# Patient Record
Sex: Male | Born: 1945 | Race: White | Hispanic: No | Marital: Married | Smoking: Former smoker
Health system: Southern US, Community
[De-identification: ages and names within clinical notes are randomized; demographics above are authoritative.]

## PROBLEM LIST (undated history)

## (undated) DIAGNOSIS — J189 Pneumonia, unspecified organism: Secondary | ICD-10-CM

## (undated) DIAGNOSIS — Z8673 Personal history of transient ischemic attack (TIA), and cerebral infarction without residual deficits: Secondary | ICD-10-CM

## (undated) DIAGNOSIS — I10 Essential (primary) hypertension: Secondary | ICD-10-CM

## (undated) DIAGNOSIS — E785 Hyperlipidemia, unspecified: Secondary | ICD-10-CM

## (undated) HISTORY — DX: Pneumonia, unspecified organism: J18.9

## (undated) HISTORY — DX: Essential (primary) hypertension: I10

## (undated) HISTORY — PX: TONSILLECTOMY: SUR1361

## (undated) HISTORY — PX: NASAL POLYP EXCISION: SHX2068

## (undated) HISTORY — DX: Hyperlipidemia, unspecified: E78.5

## (undated) HISTORY — DX: Personal history of transient ischemic attack (TIA), and cerebral infarction without residual deficits: Z86.73

---

## 2001-05-29 LAB — HM COLONOSCOPY: HM Colonoscopy: NORMAL

## 2008-02-18 ENCOUNTER — Ambulatory Visit: Payer: Self-pay | Admitting: Internal Medicine

## 2008-02-29 DIAGNOSIS — J4541 Moderate persistent asthma with (acute) exacerbation: Secondary | ICD-10-CM

## 2008-02-29 DIAGNOSIS — J328 Other chronic sinusitis: Secondary | ICD-10-CM | POA: Insufficient documentation

## 2008-02-29 DIAGNOSIS — J209 Acute bronchitis, unspecified: Secondary | ICD-10-CM | POA: Insufficient documentation

## 2008-03-04 ENCOUNTER — Ambulatory Visit: Payer: Self-pay | Admitting: Internal Medicine

## 2008-05-22 ENCOUNTER — Ambulatory Visit: Payer: Self-pay | Admitting: Internal Medicine

## 2008-05-22 LAB — CONVERTED CEMR LAB
Eosinophils Relative: 6.3 % — ABNORMAL HIGH (ref 0.0–5.0)
HCT: 47 % (ref 39.0–52.0)
Hemoglobin: 16.4 g/dL (ref 13.0–17.0)
IgE (Immunoglobulin E), Serum: 64.2 intl units/mL (ref 0.0–180.0)
Lymphocytes Relative: 16.7 % (ref 12.0–46.0)
MCHC: 34.9 g/dL (ref 30.0–36.0)
Monocytes Relative: 9.7 % (ref 3.0–12.0)
Neutrophils Relative %: 67.2 % (ref 43.0–77.0)
Platelets: 228 10*3/uL (ref 150–400)
RBC: 4.99 M/uL (ref 4.22–5.81)
RDW: 12.4 % (ref 11.5–14.6)

## 2008-06-30 ENCOUNTER — Ambulatory Visit: Payer: Self-pay | Admitting: Internal Medicine

## 2008-10-29 ENCOUNTER — Ambulatory Visit: Payer: Self-pay | Admitting: Internal Medicine

## 2009-04-17 DIAGNOSIS — Z8673 Personal history of transient ischemic attack (TIA), and cerebral infarction without residual deficits: Secondary | ICD-10-CM

## 2009-04-17 HISTORY — DX: Personal history of transient ischemic attack (TIA), and cerebral infarction without residual deficits: Z86.73

## 2009-10-29 ENCOUNTER — Telehealth: Payer: Self-pay | Admitting: Internal Medicine

## 2009-11-04 ENCOUNTER — Ambulatory Visit: Payer: Self-pay | Admitting: Internal Medicine

## 2009-11-06 DIAGNOSIS — J330 Polyp of nasal cavity: Secondary | ICD-10-CM

## 2010-04-10 ENCOUNTER — Inpatient Hospital Stay (HOSPITAL_COMMUNITY)
Admission: EM | Admit: 2010-04-10 | Discharge: 2010-04-13 | Payer: Self-pay | Attending: Neurology | Admitting: Neurology

## 2010-04-10 ENCOUNTER — Emergency Department (HOSPITAL_COMMUNITY)
Admission: EM | Admit: 2010-04-10 | Discharge: 2010-04-11 | Disposition: A | Discharge status: Other Acute Inpatient Hospital | Payer: Self-pay | Source: Home / Self Care | Admitting: Emergency Medicine

## 2010-04-12 ENCOUNTER — Encounter (INDEPENDENT_AMBULATORY_CARE_PROVIDER_SITE_OTHER): Payer: Self-pay | Admitting: Neurology

## 2010-04-19 ENCOUNTER — Telehealth (INDEPENDENT_AMBULATORY_CARE_PROVIDER_SITE_OTHER): Payer: Self-pay | Admitting: *Deleted

## 2010-04-20 DIAGNOSIS — I6350 Cerebral infarction due to unspecified occlusion or stenosis of unspecified cerebral artery: Secondary | ICD-10-CM | POA: Insufficient documentation

## 2010-04-22 ENCOUNTER — Encounter
Admission: RE | Admit: 2010-04-22 | Discharge: 2010-05-17 | Payer: Self-pay | Source: Home / Self Care | Attending: Nurse Practitioner | Admitting: Nurse Practitioner

## 2010-04-27 ENCOUNTER — Encounter: Payer: Self-pay | Admitting: Internal Medicine

## 2010-04-27 ENCOUNTER — Ambulatory Visit
Admission: RE | Admit: 2010-04-27 | Discharge: 2010-04-27 | Payer: Self-pay | Source: Home / Self Care | Attending: Internal Medicine | Admitting: Internal Medicine

## 2010-04-27 ENCOUNTER — Telehealth: Payer: Self-pay | Admitting: Internal Medicine

## 2010-04-27 ENCOUNTER — Other Ambulatory Visit: Payer: Self-pay | Admitting: Internal Medicine

## 2010-04-27 DIAGNOSIS — Z8679 Personal history of other diseases of the circulatory system: Secondary | ICD-10-CM | POA: Insufficient documentation

## 2010-04-27 DIAGNOSIS — E785 Hyperlipidemia, unspecified: Secondary | ICD-10-CM | POA: Insufficient documentation

## 2010-04-27 DIAGNOSIS — I10 Essential (primary) hypertension: Secondary | ICD-10-CM | POA: Insufficient documentation

## 2010-04-27 LAB — BASIC METABOLIC PANEL
BUN: 13 mg/dL (ref 6–23)
CO2: 29 mEq/L (ref 19–32)
Calcium: 10.3 mg/dL (ref 8.4–10.5)
Chloride: 99 mEq/L (ref 96–112)
Creatinine, Ser: 1 mg/dL (ref 0.4–1.5)
GFR: 83.18 mL/min (ref >60.00)
Glucose, Bld: 95 mg/dL (ref 70–99)
Potassium: 4.4 mEq/L (ref 3.5–5.1)
Sodium: 138 mEq/L (ref 135–145)

## 2010-04-27 LAB — LIPID PANEL
Cholesterol: 290 mg/dL — ABNORMAL HIGH (ref 0–200)
HDL: 84.4 mg/dL (ref >39.00)
Total CHOL/HDL Ratio: 3
Triglycerides: 97 mg/dL (ref 0.0–149.0)
VLDL: 19.4 mg/dL (ref 0.0–40.0)

## 2010-04-27 LAB — LDL CHOLESTEROL, DIRECT: Direct LDL: 192.8 mg/dL

## 2010-04-27 LAB — CONVERTED CEMR LAB

## 2010-04-29 ENCOUNTER — Telehealth: Payer: Self-pay | Admitting: Internal Medicine

## 2010-05-02 ENCOUNTER — Telehealth: Payer: Self-pay | Admitting: Internal Medicine

## 2010-05-11 ENCOUNTER — Ambulatory Visit
Admission: RE | Admit: 2010-05-11 | Discharge: 2010-05-11 | Payer: Self-pay | Source: Home / Self Care | Attending: Internal Medicine | Admitting: Internal Medicine

## 2010-05-17 NOTE — Assessment & Plan Note (Signed)
Summary: rov/jd   Copy to:  Dr. Knox Royalty Primary Provider/Referring Provider:  [facility]  CC:  1 year follow up, pt request new rx for advair, pt states he has been coughing up more mucous than normally and it is yellowish clear x2weeks but pt says he is getting over it now, and pt states his breathing is doing good and overall he is well.  History of Present Illness: 06/30/08- Asthma, Chronic sinusitis, nasal polyps IgE 64.2, EOS- 6.3%, Immunocap POS- grasses and plaintain. Never on allergy vaccine. Nasal polypectomy x 2.  Today better with less chest congestion than last time. Using Advair . We tried adding Qvar 80 sample but he couldnt tell benefit. Continues maintenance prednisone. He will be revisitng his ENT in [place] foir nasal polyp f/u later this Spring.  10/29/08- Asthma, Chronic sinusitis, nasal polyps Still travels to [place] occasionally, without noticeable location effect. Sinus congestion and hoarseness come and go. Also feels about the same going back and forth to the [employer]. this year overall has been pretty good. Remains on maintenance prednisone 5 mg daily with other meds. He hasn't felt need for rescue inhaler in 10 years.  12-04-09- Asthma, Chronic sinusitis, nasal polyps. No longer flying. No recent colds. Starting 2 weeks ago has had increased cough with yellowish clear phlegm. Getting better now.Otherwise had not had colds or flu over past year. Sees his ENT twice yearly and told nasal polyps are stable "ok". Only minimal occasional wheeze. He hasn't needed a rescue inhaler in 5-6 years. Continues Advair. Continues prednisone 5 mg daily started by his ENT, which has worked well to suppress the polyps and inflammation.   Asthma History    Initial Asthma Severity Rating:    Age range: 12+ years    Symptoms: 0-2 days/week    Nighttime Awakenings: 0-2/month    Interferes w/ normal activity: no limitations    SABA use (not for EIB): 0-2  days/week    Asthma Severity Assessment: Intermittent   Preventive Screening-Counseling & Management  Alcohol-Tobacco     Smoking Status: quit     Packs/Day: <1ppd     Year Started: age 57     Year Quit: 40 years ago  Current Medications (verified): 1)  Prednisone 5 Mg Tabs (Prednisone) .... Take 1 By Mouth Once Daily 2)  Singulair 10 Mg Tabs (Montelukast Sodium) .... Take 1 By Mouth At Bedtime 3)  Advair Diskus 250-50 Mcg/dose Misc (Fluticasone-Salmeterol) .... Inhale 1 Puff Twice Daily: Rinse Mouth Well After Use 4)  Flonase 50 Mcg/act Susp (Fluticasone Propionate) .Marland Kitchen.. 1-2 Sprays in Each Nostril Twice Daily As Needed  Allergies (verified): No Known Drug Allergies  Past History:  Past Medical History: Last updated: 06/30/2008 Asthma- allergic Pneumonia Chronic sinusitis Hx nasal polyps  Family History: Last updated: 12/04/2009 Heart disease- father died age 25 Mother- died unknown cause- neurologic  Social History: Last updated: 02/18/2008 Patient states former smoker. - 1975 married retired Occupational hygienist, Magazine features editor  Risk Factors: Smoking Status: quit (2009-12-04) Packs/Day: <1ppd (12/04/2009)  Past Surgical History: Nasal polypectomy x 2 Tonsillectomy  Family History: Heart disease- father died age 81 Mother- died unknown cause- neurologic  Social History: Packs/Day:  <1ppd  Review of Systems      See HPI       The patient complains of shortness of breath with activity and nasal congestion/difficulty breathing through nose.  The patient denies shortness of breath at rest, productive cough, non-productive cough, coughing up blood, chest pain,  irregular heartbeats, acid heartburn, indigestion, loss of appetite, weight change, abdominal pain, difficulty swallowing, sore throat, tooth/dental problems, headaches, and sneezing.    Vital Signs:  Patient profile:   65 year old male Height:      71 inches Weight:      219 pounds BMI:     30.65 O2 Sat:      92 %  on Room air BP sitting:   168 / 98  (right arm) Cuff size:   large  Vitals Entered By: Carver Fila (November 04, 2009 2:30 PM)  O2 Flow:  Room air CC: 1 year follow up, pt request new rx for advair, pt states he has been coughing up more mucous than normally and it is yellowish clear x2weeks but pt says he is getting over it now, pt states his breathing is doing good and overall he is well Comments meds and allergies updated Phone number updated Carver Fila  November 04, 2009 2:31 PM    Physical Exam  Additional Exam:  General: A/Ox3; pleasant and cooperative, NAD, Healthy appearing,  again seems restless, a little fidgety SKIN: no rash, lesions NODES: no lymphadenopathy HEENT: [place]/AT, EOM- WNL, Conjuctivae-clear, PERRLA, TM-WNL, Nose-  again sounds nasal, mucosa a little red without erosion. I do not see polyps., Throat- clear and wnl, with no postnasal drip. Uvula is reddened., Mallampati  II, not hoarse. NECK: Supple w/ fair ROM, JVD- none, normal carotid impulses w/o bruits Thyroid-  CHEST: quiet, clear without wheeze. Trace crackle, unlabored. 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 He continues flonase and his ENT f/u in [place] as before.  Problem # 2:  ASTHMA (ICD-493.90) Good control. He remains steroid dependent but with this he  doesn't need a rescue inhaler. We will rewrite for his Advair. We discussed side effects of long term steroid use.  Other Orders: Est. Patient Level IV (45409)  Patient Instructions: 1)  Please schedule a follow-up appointment in 1 year. 2)  Refill for Advair sent to drug store Prescriptions: ADVAIR DISKUS 250-50 MCG/DOSE MISC (FLUTICASONE-SALMETEROL) Inhale 1 puff twice daily: Rinse mouth well after use  #3 x 3   Entered and Authorized by:   Waymon Budge MD   Signed by:   Waymon Budge MD on 11/04/2009   Method used:   Electronically to        [facility]  [phone]* (retail)       [address] [place], [place]  [address]       Ph: [phone] or [phone]       Fax: [phone]   RxID:   [id number]

## 2010-05-17 NOTE — Progress Notes (Signed)
Summary: nos appt  Phone Note Call from Patient   Caller: Lee Blevins  Call For: Maurisha Mongeau Summary of Call: Rsc nos from 7/14 to 7/21 @ 2:15, pt states he was out of town. Initial call taken by: Darletta Moll,  October 29, 2009 9:09 AM

## 2010-05-19 NOTE — Progress Notes (Signed)
       New/Updated Medications: CRESTOR 20 MG TABS (ROSUVASTATIN CALCIUM) One by mouth once daily for cholesterol Prescriptions: CRESTOR 20 MG TABS (ROSUVASTATIN CALCIUM) One by mouth once daily for cholesterol  #30 x 11   Entered and Authorized by:   Etta Grandchild MD   Signed by:   Etta Grandchild MD on 04/27/2010   Method used:   Print then Give to Patient   RxID:   [id number]

## 2010-05-19 NOTE — Progress Notes (Signed)
Summary: dizzy,please call pt!  Phone Note Call from Patient Call back at Home Phone [phone]   Caller: [phone] Call For: Yetta Barre Summary of Call: Pt states blood pressure is 130/65,which is low for him, dizzy,not sob,back of neck hurts,tired. Please advise. Please call pt Initial call taken by: Verdell Face,  April 29, 2010 3:46 PM  Follow-up for Phone Call        wife tells me his BP has been around 130/65 today and that he had a brief dizzy spell but he feels better now, I told her that this was an acceeptable BP and to let me know if any symptoms occur Follow-up by: Etta Grandchild MD,  April 29, 2010 4:57 PM

## 2010-05-19 NOTE — Assessment & Plan Note (Signed)
Summary: LIGHTHEADNESS--ER/IF WORSEN--PER WIFE PT IN BACKGROUND--STC   Vital Signs:  Patient profile:   65 year old male Height:      71 inches Weight:      209 pounds BMI:     29.25 O2 Sat:      98 % on Room air Temp:     97.8 degrees F oral Pulse rate:   88 / minute Pulse rhythm:   regular Resp:     16 per minute BP sitting:   140 / 70  (left arm) Cuff size:   regular  Vitals Entered By: Lanier Prude, CMA(AAMA) (May 11, 2010 8:12 AM)  Nutrition Counseling: Patient's BMI is greater than 25 and therefore counseled on weight management options.  O2 Flow:  Room air CC: dizziness & lightheaded since starting Benicar, Hypertension Management Is Patient Diabetic? No   Primary Care Provider:  Yetta Barre  CC:  dizziness & lightheaded since starting Benicar and Hypertension Management.  History of Present Illness: He returns for f/up and tells me that he is not doing well on current BP meds due to feeling lightheaded throughout the day. His BP at home on an [employer] machine has been 115-125/62-75.  Asthma History    Asthma Control Assessment:    Age range: 12+ years    Symptoms: 0-2 days/week    Nighttime Awakenings: 0-2/month    Interferes w/ normal activity: no limitations    SABA use (not for EIB): 0-2 days/week    Asthma Control Assessment: Well Controlled  Hypertension History:      He complains of side effects from treatment, but denies headache, chest pain, palpitations, dyspnea with exertion, orthopnea, PND, peripheral edema, visual symptoms, neurologic problems, and syncope.  He notes the following problems with antihypertensive medication side effects: dizziness.        Positive major cardiovascular risk factors include male age 32 years old or older, hyperlipidemia, and hypertension.  Negative major cardiovascular risk factors include no history of diabetes, negative family history for ischemic heart disease, and non-tobacco-user status.        Positive history for  target organ damage include prior stroke (or TIA) and hypertensive retinopathy.  Further assessment for target organ damage reveals no history of ASHD, cardiac end-organ damage (CHF/LVH), peripheral vascular disease, or renal insufficiency.     Current Medications (verified): 1)  Prednisone 5 Mg Tabs (Prednisone) .... Take 1 By Mouth Once Daily 2)  Singulair 10 Mg Tabs (Montelukast Sodium) .... Take 1 By Mouth At Bedtime 3)  Advair Diskus 250-50 Mcg/dose Misc (Fluticasone-Salmeterol) .... Inhale 1 Puff Twice Daily: Rinse Mouth Well After Use 4)  Flonase 50 Mcg/act Susp (Fluticasone Propionate) .Marland Kitchen.. 1-2 Sprays in Each Nostril Twice Daily As Needed 5)  Crestor 20 Mg Tabs (Rosuvastatin Calcium) .... One By Mouth Once Daily For Cholesterol 6)  Benicar Hct 40-12.5 Mg Tabs (Olmesartan Medoxomil-Hctz) .... One By Mouth Once Daily  Allergies (verified): No Known Drug Allergies  Past History:  Past Medical History: Last updated: 04/27/2010 Asthma- allergic Pneumonia Chronic sinusitis Hx nasal polyps Hyperlipidemia Hypertension Cerebrovascular accident, hx of  Past Surgical History: Last updated: 2009-11-21 Nasal polypectomy x 2 Tonsillectomy  Family History: Last updated: November 21, 2009 Heart disease- father died age 54 Mother- died unknown cause- neurologic  Social History: Last updated: 04/27/2010 Patient states former smoker. 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 Heart disease- father died age 57 Mother- died unknown cause- neurologic  Social History: Reviewed history from 04/27/2010 and no changes required. Patient states former smoker. -  1975 married retired Occupational hygienist, Magazine features editor Alcohol use-yes Drug use-no Regular exercise-yes  Review of Systems  The patient denies anorexia, fever, weight loss, weight gain, chest pain, syncope, dyspnea on exertion, peripheral edema, prolonged cough, headaches, hemoptysis, abdominal pain, muscle weakness, transient blindness, and difficulty walking.   Neuro:  Denies brief paralysis, difficulty with concentration, disturbances in coordination, falling down, headaches, inability to speak, memory loss, numbness, poor balance, sensation of room spinning, tingling, tremors, visual disturbances, and weakness.  Physical Exam  General:  alert, well-developed, well-nourished, well-hydrated, appropriate dress, normal appearance, healthy-appearing, and cooperative to examination.   Head:  normocephalic, atraumatic, no abnormalities observed, and no abnormalities palpated.   Eyes:  vision grossly intact, pupils equal, pupils round, pupils reactive to light, pupils react to accomodation, no injection, no nystagmus, and a-v nicking.   Mouth:  Oral mucosa and oropharynx without lesions or exudates.  Teeth in good repair. Neck:  supple, full ROM, no masses, no thyromegaly, no thyroid nodules or tenderness, no JVD, normal carotid upstroke, no carotid bruits, no cervical lymphadenopathy, and no neck tenderness.   Lungs:  normal respiratory effort, no intercostal retractions, no accessory muscle use, normal breath sounds, no dullness, no fremitus, no crackles, and no wheezes.   Heart:  normal rate, regular rhythm, no murmur, no gallop, no rub, and no JVD.   Abdomen:  soft, non-tender, normal bowel sounds, no distention, no masses, no guarding, no rigidity, no rebound tenderness, no abdominal hernia, no inguinal hernia, no hepatomegaly, and no splenomegaly.   Msk:  No deformity or scoliosis noted of thoracic or lumbar spine.   Pulses:  R and L carotid,radial,femoral,dorsalis pedis and posterior tibial pulses are full  and equal bilaterally Extremities:  No clubbing, cyanosis, edema, or deformity noted with normal full range of motion of all joints.   Neurologic:  No cranial nerve deficits noted. Station and gait are normal. Plantar reflexes are down-going bilaterally. DTRs are symmetrical throughout. Sensory, motor and coordinative functions appear intact. Skin:  Intact without suspicious lesions or rashes Cervical Nodes:  no anterior cervical adenopathy and no posterior cervical adenopathy.   Psych:  Cognition and judgment appear intact. Alert and cooperative with normal attention span and concentration. No apparent delusions, illusions, hallucinations   Impression & Recommendations:  Problem # 1:  CVA (ICD-434.91) Assessment Unchanged  Problem # 2:  HYPERTENSION (ICD-401.9) Assessment: Unchanged  The following medications were removed from the medication list:    Benicar Hct 40-12.5 Mg Tabs (Olmesartan medoxomil-hctz) ..... One by mouth once daily His updated medication list for this problem includes:    Benicar Hct 20-12.5 Mg Tabs (Olmesartan medoxomil-hctz) ..... One by mouth once daily for high blood pressure  Complete Medication List: 1)  Prednisone 5 Mg Tabs (Prednisone) .... Take 1 by mouth once daily 2)  Singulair 10 Mg Tabs (Montelukast sodium) .... Take 1 by mouth at bedtime 3)  Advair Diskus 250-50 Mcg/dose Misc (Fluticasone-salmeterol) .... Inhale 1 puff twice daily: rinse mouth well after use 4)  Flonase 50 Mcg/act Susp (Fluticasone propionate) .Marland Kitchen.. 1-2 sprays in each nostril twice daily as needed 5)  Crestor 20 Mg Tabs (Rosuvastatin calcium) .... One by mouth once daily for cholesterol 6)  Benicar Hct 20-12.5 Mg Tabs (  Olmesartan medoxomil-hctz) .... One by mouth once daily for high blood pressure  Hypertension Assessment/Plan:      The patient's hypertensive risk group is category C: Target organ damage and/or diabetes.  Today's blood pressure is 140/70.  His blood pressure goal is <  140/90.   Patient Instructions: 1)  Please schedule a follow-up appointment in 1 month. 2)  It is important that you exercise regularly at least 20 minutes 5 times a week. If you develop chest pain, have severe difficulty breathing, or feel very tired , stop exercising immediately and seek medical attention. 3)  You need to lose weight. Consider a lower calorie diet and regular exercise.  4)  Check your Blood Pressure regularly. If it is above 140/90: you should make an appointment. Prescriptions: BENICAR HCT 20-12.5 MG TABS (OLMESARTAN MEDOXOMIL-HCTZ) One by mouth once daily for high blood pressure  #42 x 0   Entered and Authorized by:   Etta Grandchild MD   Signed by:   Etta Grandchild MD on 05/11/2010   Method used:   Samples Given   RxID:   [id number]    Orders Added: 1)  Est. Patient Level IV [95621]

## 2010-05-19 NOTE — Progress Notes (Signed)
Summary: Call Report  Phone Note Other Incoming   Caller: [facility] Summary of Call: [facility] Triage Call Report Triage Record Num: [id number] Operator: Cala Bradford VanScoyoc Patient Name: Lee Blevins Call Date & Time: 05/01/2010 5:26:45PM Patient Phone: [phone] PCP: Sanda Linger Patient Gender: Male PCP Fax : Patient DOB: [birth year] Practice Name: [facility] Reason for Call: Margaret/ Spouse calling stating she is concerned about pt BP being low. Last reading was 110/63. Pt has HX of Intra Cerebral Hemmorhage. Wife concerned with new Blood Pressure medication feels it is making BP too low. Lowest reading was 93/63. Pt has no sxs of low blood pressure. Instructed wife to F/U with office in AM with concerns over medication and BP and to call back with any questions or concerns. Protocol(s) Used: No Guideline - Advice Per Reference (Adult) Recommended Outcome per Protocol: Call Provider within 24 Hours Reason for Outcome: CALL PROVIDER WITHIN 24 HOURS Care Advice:  ~ 01/ Initial call taken by: Margaret Pyle, CMA,  May 02, 2010 8:36 AM

## 2010-05-19 NOTE — Assessment & Plan Note (Signed)
Summary: NEW/MEDICARE/ [facility] /NWS  #   Vital Signs:  Patient profile:   65 year old male Height:      71 inches Weight:      211.75 pounds BMI:     29.64 O2 Sat:      95 % on Room air Temp:     97.9 degrees F oral Pulse rate:   99 / minute Pulse rhythm:   regular Resp:     16 per minute BP supine:   152 / 86  (right arm) BP sitting:   150 / 82  (left arm)  Vitals Entered By: Rock Nephew CMA (April 27, 2010 2:52 PM)  Nutrition Counseling: Patient's BMI is greater than 25 and therefore counseled on weight management options.  O2 Flow:  Room air  Primary Care Provider:  Yetta Barre   History of Present Illness: New to me he had a hypertensive CVA 3 weeks ago and comes in today to establish primary care and to check his blood pressure. His only deficit from the CVA is mild difficulty with word finding.  Hypertension History:      He denies headache, chest pain, palpitations, dyspnea with exertion, orthopnea, PND, peripheral edema, visual symptoms, neurologic problems, syncope, and side effects from treatment.  He notes no problems with any antihypertensive medication side effects.        Positive major cardiovascular risk factors include male age 63 years old or older, hyperlipidemia, and hypertension.  Negative major cardiovascular risk factors include no history of diabetes, negative family history for ischemic heart disease, and non-tobacco-user status.        Positive history for target organ damage include prior stroke (or TIA) and hypertensive retinopathy.  Further assessment for target organ damage reveals no history of ASHD, cardiac end-organ damage (CHF/LVH), peripheral vascular disease, or renal insufficiency.     Preventive Screening-Counseling & Management  Alcohol-Tobacco     Alcohol drinks/day: 2     Alcohol type: wine     >5/day in last 3 mos: no     Alcohol Counseling: not indicated; use of alcohol is not excessive or problematic     Feels need to cut down: no  Feels annoyed by complaints: no     Feels guilty re: drinking: no     Needs 'eye opener' in am: no     Smoking Status: never     Tobacco Counseling: not indicated; no tobacco use  Caffeine-Diet-Exercise     Does Patient Exercise: yes  Hep-HIV-STD-Contraception     Hepatitis Risk: no risk noted     HIV Risk: no risk noted     STD Risk: no risk noted      Sexual History:  currently monogamous.        Drug Use:  no.        Blood Transfusions:  no.    Clinical Review Panels:  Prevention   Last Colonoscopy:  Normal (05/29/2001)  Immunizations   Last Flu Vaccine:  Fluvax 3+ (04/07/2010)   Last Pneumovax:  Pneumovax (04/07/2010)  Diabetes Management   Last Flu Vaccine:  Fluvax 3+ (04/07/2010)   Last Pneumovax:  Pneumovax (04/07/2010)  CBC   WBC:  5.5 (05/22/2008)   RBC:  4.99 (05/22/2008)   Hgb:  16.4 (05/22/2008)   Hct:  47.0 (05/22/2008)   Platelets:  228 (05/22/2008)   MCV  94.2 (05/22/2008)   MCHC  34.9 (05/22/2008)   RDW  12.4 (05/22/2008)   PMN:  67.2 (05/22/2008)   Lymphs:  16.7 (05/22/2008)   Monos:  9.7 (05/22/2008)   Eosinophils:  6.3 (05/22/2008)   Basophil:  0.1 (05/22/2008)   Medications Prior to Update: 1)  Prednisone 5 Mg Tabs (Prednisone) .... Take 1 By Mouth Once Daily 2)  Singulair 10 Mg Tabs (Montelukast Sodium) .... Take 1 By Mouth At Bedtime 3)  Advair Diskus 250-50 Mcg/dose Misc (Fluticasone-Salmeterol) .... Inhale 1 Puff Twice Daily: Rinse Mouth Well After Use 4)  Flonase 50 Mcg/act Susp (Fluticasone Propionate) .Marland Kitchen.. 1-2 Sprays in Each Nostril Twice Daily As Needed  Current Medications (verified): 1)  Prednisone 5 Mg Tabs (Prednisone) .... Take 1 By Mouth Once Daily 2)  Singulair 10 Mg Tabs (Montelukast Sodium) .... Take 1 By Mouth At Bedtime 3)  Advair Diskus 250-50 Mcg/dose Misc (Fluticasone-Salmeterol) .... Inhale 1 Puff Twice Daily: Rinse Mouth Well After Use 4)  Flonase 50 Mcg/act Susp (Fluticasone Propionate) .Marland Kitchen.. 1-2 Sprays in Each  Nostril Twice Daily As Needed 5)  Benicar Hct 40-25 Mg Tabs (Olmesartan Medoxomil-Hctz) .... One By Mouth Once Daily For High Blood Pressure  Allergies (verified): No Known Drug Allergies  Past History:  Past Medical History: Asthma- allergic Pneumonia Chronic sinusitis Hx nasal polyps Hyperlipidemia Hypertension Cerebrovascular accident, hx of  Family History: Reviewed history from 11/04/2009 and no changes required. Heart disease- father died age 65 Mother- died unknown cause- neurologic  Social History: Patient states former smoker. - 1975 married retired Occupational hygienist, Magazine features editor Alcohol use-yes Drug use-no Regular exercise-yes Drug Use:  no Does Patient Exercise:  yes Smoking Status:  never Hepatitis Risk:  no risk noted HIV Risk:  no risk noted STD Risk:  no risk noted Sexual History:  currently monogamous Blood Transfusions:  no  Review of Systems  The patient denies anorexia, fever, weight loss, weight gain, chest pain, syncope, dyspnea on exertion, peripheral edema, prolonged cough, headaches, hemoptysis, abdominal pain, hematuria, suspicious skin lesions, difficulty walking, depression, abnormal bleeding, enlarged lymph nodes, and angioedema.   Neuro:  Denies difficulty with concentration, disturbances in coordination, falling down, headaches, inability to speak, memory loss, numbness, poor balance, seizures, sensation of room spinning, tingling, tremors, visual disturbances, and weakness.  Physical Exam  General:  alert, well-developed, well-nourished, well-hydrated, appropriate dress, normal appearance, healthy-appearing, and cooperative to examination.   Head:  normocephalic, atraumatic, no abnormalities observed, and no abnormalities palpated.   Eyes:  vision grossly intact, pupils equal, pupils round, pupils reactive to light, pupils react to accomodation, no injection, no nystagmus, and a-v nicking.   Ears:  R ear normal and L ear normal.   Nose:  External  nasal examination shows no deformity or inflammation. Nasal mucosa are pink and moist without lesions or exudates. Mouth:  Oral mucosa and oropharynx without lesions or exudates.  Teeth in good repair. Neck:  supple, full ROM, no masses, no thyromegaly, no thyroid nodules or tenderness, no JVD, normal carotid upstroke, no carotid bruits, no cervical lymphadenopathy, and no neck tenderness.   Lungs:  normal respiratory effort, no intercostal retractions, no accessory muscle use, normal breath sounds, no dullness, no fremitus, no crackles, and no wheezes.   Heart:  normal rate, regular rhythm, no murmur, no gallop, no rub, and no JVD.   Abdomen:  soft, non-tender, normal bowel sounds, no distention, no masses, no guarding, no rigidity, no rebound tenderness, no abdominal hernia, no inguinal hernia, no hepatomegaly, and no splenomegaly.   Msk:  No deformity or scoliosis noted of thoracic or lumbar spine.   Pulses:  R and L carotid,radial,femoral,dorsalis pedis  and posterior tibial pulses are full and equal bilaterally Extremities:  No clubbing, cyanosis, edema, or deformity noted with normal full range of motion of all joints.   Neurologic:  No cranial nerve deficits noted. Station and gait are normal. Plantar reflexes are down-going bilaterally. DTRs are symmetrical throughout. Sensory, motor and coordinative functions appear intact. Skin:  Intact without suspicious lesions or rashes Cervical Nodes:  no anterior cervical adenopathy and no posterior cervical adenopathy.   Axillary Nodes:  no R axillary adenopathy and no L axillary adenopathy.   Psych:  Cognition and judgment appear intact. Alert and cooperative with normal attention span and concentration. No apparent delusions, illusions, hallucinations   Impression & Recommendations:  Problem # 1:  HYPERTENSION (ICD-401.9) Assessment Unchanged  His updated medication list for this problem includes:    Benicar Hct 40-25 Mg Tabs (Olmesartan  medoxomil-hctz) ..... One by mouth once daily for high blood pressure  Orders: Venipuncture (04540) TLB-Lipid Panel (80061-LIPID) TLB-BMP (Basic Metabolic Panel-BMET) (80048-METABOL)  BP today: 150/82 Prior BP: 168/98 (11/04/2009)  10 Yr Risk Heart Disease: Not enough information  Problem # 2:  HYPERLIPIDEMIA (ICD-272.4) Assessment: Unchanged  Orders: Venipuncture (98119) TLB-Lipid Panel (80061-LIPID) TLB-BMP (Basic Metabolic Panel-BMET) (80048-METABOL)  Problem # 3:  CVA (ICD-434.91) Assessment: Improved  Complete Medication List: 1)  Prednisone 5 Mg Tabs (Prednisone) .... Take 1 by mouth once daily 2)  Singulair 10 Mg Tabs (Montelukast sodium) .... Take 1 by mouth at bedtime 3)  Advair Diskus 250-50 Mcg/dose Misc (Fluticasone-salmeterol) .... Inhale 1 puff twice daily: rinse mouth well after use 4)  Flonase 50 Mcg/act Susp (Fluticasone propionate) .Marland Kitchen.. 1-2 sprays in each nostril twice daily as needed 5)  Benicar Hct 40-25 Mg Tabs (Olmesartan medoxomil-hctz) .... One by mouth once daily for high blood pressure  Hypertension Assessment/Plan:      The patient's hypertensive risk group is category C: Target organ damage and/or diabetes.  Today's blood pressure is 150/82.  His blood pressure goal is < 140/90.  Colorectal Screening:  Colonoscopy Results:    Date of Exam: 05/29/2001    Results: Normal  PSA Screening:    Reviewed PSA screening recommendations: patient defers PSA but will reconsider in the future  Immunization & Chemoprophylaxis:    Influenza vaccine: Fluvax 3+  (04/07/2010)    Pneumovax: Pneumovax  (04/07/2010)  Patient Instructions: 1)  Please schedule a follow-up appointment in 1 month. 2)  It is important that you exercise regularly at least 20 minutes 5 times a week. If you develop chest pain, have severe difficulty breathing, or feel very tired , stop exercising immediately and seek medical attention. 3)  You need to lose weight. Consider a lower  calorie diet and regular exercise.  4)  Check your Blood Pressure regularly. If it is above 130/80: you should make an appointment. 5)  Limit your Sodium (Salt) to less than 4 grams a day (slightly less than 1 teaspoon) to prevent fluid retention, swelling, or worsening or symptoms. Prescriptions: BENICAR HCT 40-25 MG TABS (OLMESARTAN MEDOXOMIL-HCTZ) One by mouth once daily for high blood pressure  #70 x 0   Entered and Authorized by:   Etta Grandchild MD   Signed by:   Etta Grandchild MD on 04/27/2010   Method used:   Samples Given   RxID:   [id number]    Orders Added: 1)  Venipuncture [36415] 2)  TLB-Lipid Panel [80061-LIPID] 3)  TLB-BMP (Basic Metabolic Panel-BMET) [80048-METABOL] 4)  New Patient Level IV [99204]   Immunization  History:  Influenza Immunization History:    Influenza:  fluvax 3+ (04/07/2010)  Pneumovax Immunization History:    Pneumovax:  pneumovax (04/07/2010)   Immunization History:  Influenza Immunization History:    Influenza:  Fluvax 3+ (04/07/2010)  Pneumovax Immunization History:    Pneumovax:  Pneumovax (04/07/2010)  Prevention & Chronic Care Immunizations   Influenza vaccine: Fluvax 3+  (04/07/2010)    Tetanus booster: Not documented   Td booster deferral: Refused  (04/27/2010)    Pneumococcal vaccine: Pneumovax  (04/07/2010)    H. zoster vaccine: Not documented   H. zoster vaccine deferral: Refused  (04/27/2010)  Colorectal Screening   Hemoccult: Not documented    Colonoscopy: Normal  (05/29/2001)  Other Screening   PSA: Not documented   PSA action/deferral: patient defers PSA but will reconsider in the future  (04/27/2010)   Smoking status: never  (04/27/2010)  Lipids   Total Cholesterol: Not documented   LDL: Not documented   LDL Direct: Not documented   HDL: Not documented   Triglycerides: Not documented    SGOT (AST): Not documented   SGPT (ALT): Not documented   Alkaline phosphatase: Not documented   Total  bilirubin: Not documented  Hypertension   Last Blood Pressure: 150 / 82  (04/27/2010)   Serum creatinine: Not documented   Serum potassium Not documented  Self-Management Support :    Hypertension self-management support: Not documented    Lipid self-management support: Not documented

## 2010-05-19 NOTE — Progress Notes (Signed)
Summary: PCP referral  Phone Note Call from Patient Call back at Home Phone [phone]   Caller: spouse/Margaret Call For: dr Maple Hudson Summary of Call: patients wife phoned stated that Mr. Hudler had a stroke on Christmas and said that all of the information was sent to Dr. Maple Hudson from [facility]. They told Ms. Mareno that she needed to have an appointment with Dr. Maple Hudson (or his PCP) with in the month. She also wanted to know if we have received the records from [facility].  She stated that they do not have a PCP and wanted to know if Dr. Maple Hudson would be willing to assume this care since he is established with Dr. Maple Hudson for his asthma. Patient can be reached [phone] Initial call taken by: Vedia Coffer,  April 19, 2010 1:42 PM  Follow-up for Phone Call        Pt spouse is requesting that Dr. Maple Hudson do a referral for PCP, because over christmas pt had a stroke and was advised to follow-up with PCP in 1 month and pt does not have a PCP at this time.   Please advsie. Carron Curie CMA  April 19, 2010 3:57 PM   Additional Follow-up for Phone Call Additional follow up Details #1::        i am sorry he has had trouble. i cannot serve as his PCP. Ok to offer to try to set him up with a [place] PCP. I have not gotten any records, but we can access his hospital records if needed. Additional Follow-up by: Waymon Budge MD,  April 19, 2010 5:26 PM  New Problems: CVA (ZHY-865.78)   Additional Follow-up for Phone Call Additional follow up Details #2::    Patient spouse is aware [facility] cannot serve as PCP but we will try to assist in finding PCP w/ a [place] physician. Order sent to [facility]. Follow-up by: Michel Bickers CMA,  April 20, 2010 9:08 AM  New Problems: CVA (ICD-434.91)

## 2010-05-19 NOTE — Progress Notes (Signed)
  Phone Note Other Incoming   Caller: pt's wife Details for Reason: Low BP readings Summary of Call: I did see the call a nurse notes on pts BP. Do you want pt to come in to discuss this? Please Advise Initial call taken by: Ami Bullins CMA,  May 02, 2010 9:53 AM  Follow-up for Phone Call        will lower dose of benicar hct Follow-up by: Etta Grandchild MD,  May 02, 2010 9:55 AM    Additional Follow-up for Phone Call Additional follow up Details #2::    informed pts wife Follow-up by: Ami Bullins CMA,  May 02, 2010 1:44 PM  New/Updated Medications: BENICAR HCT 40-12.5 MG TABS (OLMESARTAN MEDOXOMIL-HCTZ) one by mouth once daily Prescriptions: BENICAR HCT 40-12.5 MG TABS (OLMESARTAN MEDOXOMIL-HCTZ) one by mouth once daily  #30 x 11   Entered and Authorized by:   Etta Grandchild MD   Signed by:   Etta Grandchild MD on 05/02/2010   Method used:   Electronically to        [facility]  [phone]* (retail)       [address] [place], [place]  [address]       Ph: [phone] or [phone]       Fax: [phone]   RxID:   [id number]

## 2010-05-19 NOTE — Letter (Signed)
Summary: Lipid Letter  [facility]  [address] [place], [place] [address]   Phone: [phone]  Fax: [phone]    04/27/2010  Lee Blevins [address] [place], [place]  [address]  Dear Lee Blevins:  We have carefully reviewed your last lipid profile from  and the results are noted below with a summary of recommendations for lipid management.    Cholesterol:       290     Goal: <200 wow   HDL "good" Cholesterol:   02.72     Goal: >40 good   LDL "bad" Cholesterol:   193     Goal: <100 bad   Triglycerides:       97.0     Goal: <150 normal        TLC Diet (Therapeutic Lifestyle Change): Saturated Fats & Transfatty acids should be kept < 7% of total calories ***Reduce Saturated Fats Polyunstaurated Fat can be up to 10% of total calories Monounsaturated Fat Fat can be up to 20% of total calories Total Fat should be no greater than 25-35% of total calories Carbohydrates should be 50-60% of total calories Protein should be approximately 15% of total calories Fiber should be at least 20-30 grams a day ***Increased fiber may help lower LDL Total Cholesterol should be < 200mg /day Consider adding plant stanol/sterols to diet (example: Benacol spread) ***A higher intake of unsaturated fat may reduce Triglycerides and Increase HDL    Adjunctive Measures (may lower LIPIDS and reduce risk of Heart Attack) include: Aerobic Exercise (20-30 minutes 3-4 times a week) Limit Alcohol Consumption Weight Reduction Aspirin 75-81 mg a day by mouth (if not allergic or contraindicated) Dietary Fiber 20-30 grams a day by mouth     Current Medications: 1)    Prednisone 5 Mg Tabs (Prednisone) .... Take 1 by mouth once daily 2)    Singulair 10 Mg Tabs (Montelukast sodium) .... Take 1 by mouth at bedtime 3)    Advair Diskus 250-50 Mcg/dose Misc (Fluticasone-salmeterol) .... Inhale 1 puff twice daily: rinse mouth well after use 4)    Flonase 50 Mcg/act Susp (Fluticasone propionate)  .Marland Kitchen.. 1-2 sprays in each nostril twice daily as needed 5)    Benicar Hct 40-25 Mg Tabs (Olmesartan medoxomil-hctz) .... One by mouth once daily for high blood pressure  If you have any questions, please call. We appreciate being able to work with you.   Sincerely,    [facility]

## 2010-05-23 ENCOUNTER — Telehealth: Payer: Self-pay | Admitting: Internal Medicine

## 2010-05-25 ENCOUNTER — Telehealth: Payer: Self-pay | Admitting: Internal Medicine

## 2010-05-30 ENCOUNTER — Ambulatory Visit: Payer: Self-pay | Admitting: Internal Medicine

## 2010-06-02 NOTE — Progress Notes (Signed)
  Phone Note Call from Patient Call back at [facility] Phone [phone]   Summary of Call: Pt continues to have a little dizzyness, has been on new dose of BP med x 2 wks.  Initial call taken by: Lamar Sprinkles, CMA,  May 23, 2010 3:45 PM

## 2010-06-02 NOTE — Progress Notes (Signed)
Summary: pt left message 2/6, was not called back  Phone Note Call from Patient Call back at Home Phone [phone]   Caller: Patient--[phone]/Margaret,wife Call For: Dr Yetta Barre Summary of Call: Pt states he called 2/6 and was never called back regarding his dizziness please call pt. Initial call taken by: Verdell Face,  May 25, 2010 12:14 PM  Follow-up for Phone Call        pt tells me that he is still dizzy and wants to stop benicar Follow-up by: Etta Grandchild MD,  May 25, 2010 2:48 PM    New/Updated Medications: HYDROCHLOROTHIAZIDE 25 MG TABS (HYDROCHLOROTHIAZIDE) one by mouth once daily for high blood pressure Prescriptions: HYDROCHLOROTHIAZIDE 25 MG TABS (HYDROCHLOROTHIAZIDE) one by mouth once daily for high blood pressure  #30 x 11   Entered and Authorized by:   Etta Grandchild MD   Signed by:   Etta Grandchild MD on 05/25/2010   Method used:   [facility]        [facility]  [phone][facility] (retail)       [address] [place], [place]  [address]       Ph: [phone] or [phone]       Fax: [phone]   RxID:   [id number]

## 2010-06-06 ENCOUNTER — Encounter: Payer: Self-pay | Admitting: Internal Medicine

## 2010-06-10 ENCOUNTER — Encounter: Payer: Self-pay | Admitting: Internal Medicine

## 2010-06-10 ENCOUNTER — Ambulatory Visit (INDEPENDENT_AMBULATORY_CARE_PROVIDER_SITE_OTHER): Payer: Medicare Other | Admitting: Internal Medicine

## 2010-06-10 ENCOUNTER — Other Ambulatory Visit: Payer: Self-pay | Admitting: Internal Medicine

## 2010-06-10 DIAGNOSIS — I6350 Cerebral infarction due to unspecified occlusion or stenosis of unspecified cerebral artery: Secondary | ICD-10-CM

## 2010-06-10 DIAGNOSIS — I10 Essential (primary) hypertension: Secondary | ICD-10-CM

## 2010-06-10 DIAGNOSIS — E785 Hyperlipidemia, unspecified: Secondary | ICD-10-CM

## 2010-06-10 DIAGNOSIS — Z8679 Personal history of other diseases of the circulatory system: Secondary | ICD-10-CM

## 2010-06-10 LAB — CONVERTED CEMR LAB
Cholesterol, target level: 200 mg/dL
HDL goal, serum: 40 mg/dL
LDL Goal: 100 mg/dL

## 2010-06-14 NOTE — Letter (Signed)
Summary: [name]  [name]   Imported By: Lennie Odor 06/08/2010 11:58:39  _____________________________________________________________________  External Attachment:    Type:   Image     Comment:   External Document

## 2010-06-14 NOTE — Assessment & Plan Note (Signed)
Summary: 1 MTH FU  STC   Vital Signs:  Patient profile:   65 year old male Height:      71 inches (180.34 cm) Weight:      202 pounds (91.82 kg) BMI:     28.28 O2 Sat:      94 % on Room air Temp:     97.8 degrees F (36.56 degrees C) oral Pulse rate:   92 / minute Pulse rhythm:   regular Resp:     16 per minute BP sitting:   144 / 92  (left arm) Cuff size:   regular  Vitals Entered By: Burnard Leigh CMA(AAMA) (June 10, 2010 3:26 PM)  Nutrition Counseling: Patient's BMI is greater than 25 and therefore counseled on weight management options.  O2 Flow:  Room air CC: One mth F/U on Hypertension & Meds, Lipid Management Is Patient Diabetic? No Pain Assessment Patient in pain? no      Comments Pt will start Multivitamin on daily basis.   Primary Care Provider:  Yetta Barre  CC:  One mth F/U on Hypertension & Meds and Lipid Management.  History of Present Illness:  Hypertension Follow-Up      This is a 65 year old man who presents for Hypertension follow-up.  The patient denies lightheadedness, urinary frequency, headaches, edema, impotence, rash, and fatigue.  The patient denies the following associated symptoms: chest pain, chest pressure, exercise intolerance, dyspnea, palpitations, syncope, leg edema, and pedal edema.  Compliance with medications (by patient report) has been near 100%.  The patient reports that dietary compliance has been good.  The patient reports exercising 3-4X per week.  Adjunctive measures currently used by the patient include salt restriction and relaxation.    Lipid Management History:      Positive NCEP/ATP III risk factors include male age 71 years old or older, hypertension, and prior stroke (or TIA).  Negative NCEP/ATP III risk factors include non-diabetic, HDL cholesterol greater than 60, no family history for ischemic heart disease, non-tobacco-user status, no ASHD (atherosclerotic heart disease), no peripheral vascular disease, and no history of  aortic aneurysm.        The patient states that he knows about the "Therapeutic Lifestyle Change" diet.  His compliance with the TLC diet is fair.  The patient expresses understanding of adjunctive measures for cholesterol lowering.  Adjunctive measures started by the patient include aerobic exercise, fiber, ASA, omega-3 supplements, limit alcohol consumpton, and weight reduction.  He expresses no side effects from his lipid-lowering medication.  The patient denies any symptoms to suggest myopathy or liver disease.    Preventive Screening-Counseling & Management  Alcohol-Tobacco     Alcohol drinks/day: 2     Alcohol type: wine     >5/day in last 3 mos: no     Alcohol Counseling: not indicated; use of alcohol is not excessive or problematic     Feels need to cut down: no     Feels annoyed by complaints: no     Feels guilty re: drinking: no     Needs 'eye opener' in am: no     Smoking Status: never     Packs/Day: <1ppd     Year Started: age 43     Year Quit: 40 years ago     Tobacco Counseling: not indicated; no tobacco use  Hep-HIV-STD-Contraception     Hepatitis Risk: no risk noted     HIV Risk: no risk noted     STD Risk: no risk noted  Sexual History:  currently monogamous.        Drug Use:  no.        Blood Transfusions:  no.    Clinical Review Panels:  Prevention   Last Colonoscopy:  Normal (05/29/2001)  Immunizations   Last Flu Vaccine:  Fluvax 3+ (04/07/2010)   Last Pneumovax:  Pneumovax (04/07/2010)  Lipid Management   Cholesterol:  290 (04/27/2010)   HDL (good cholesterol):  84.40 (04/27/2010)  Diabetes Management   Creatinine:  1.0 (04/27/2010)   Last Flu Vaccine:  Fluvax 3+ (04/07/2010)   Last Pneumovax:  Pneumovax (04/07/2010)  CBC   WBC:  5.5 (05/22/2008)   RBC:  4.99 (05/22/2008)   Hgb:  16.4 (05/22/2008)   Hct:  47.0 (05/22/2008)   Platelets:  228 (05/22/2008)   MCV  94.2 (05/22/2008)   MCHC  34.9 (05/22/2008)   RDW  12.4 (05/22/2008)   PMN:   67.2 (05/22/2008)   Lymphs:  16.7 (05/22/2008)   Monos:  9.7 (05/22/2008)   Eosinophils:  6.3 (05/22/2008)   Basophil:  0.1 (05/22/2008)  Complete Metabolic Panel   Glucose:  95 (04/27/2010)   Sodium:  138 (04/27/2010)   Potassium:  4.4 (04/27/2010)   Chloride:  99 (04/27/2010)   CO2:  29 (04/27/2010)   BUN:  13 (04/27/2010)   Creatinine:  1.0 (04/27/2010)   Calcium:  10.3 (04/27/2010)   Medications Prior to Update: 1)  Prednisone 5 Mg Tabs (Prednisone) .... Take 1 By Mouth Once Daily 2)  Singulair 10 Mg Tabs (Montelukast Sodium) .... Take 1 By Mouth At Bedtime 3)  Advair Diskus 250-50 Mcg/dose Misc (Fluticasone-Salmeterol) .... Inhale 1 Puff Twice Daily: Rinse Mouth Well After Use 4)  Flonase 50 Mcg/act Susp (Fluticasone Propionate) .Marland Kitchen.. 1-2 Sprays in Each Nostril Twice Daily As Needed 5)  Crestor 20 Mg Tabs (Rosuvastatin Calcium) .... One By Mouth Once Daily For Cholesterol 6)  Hydrochlorothiazide 25 Mg Tabs (Hydrochlorothiazide) .... One By Mouth Once Daily For High Blood Pressure  Current Medications (verified): 1)  Prednisone 5 Mg Tabs (Prednisone) .... Take 1 By Mouth Once Daily 2)  Singulair 10 Mg Tabs (Montelukast Sodium) .... Take 1 By Mouth At Bedtime 3)  Advair Diskus 250-50 Mcg/dose Misc (Fluticasone-Salmeterol) .... Inhale 1 Puff Twice Daily: Rinse Mouth Well After Use 4)  Flonase 50 Mcg/act Susp (Fluticasone Propionate) .Marland Kitchen.. 1-2 Sprays in Each Nostril Twice Daily As Needed 5)  Crestor 20 Mg Tabs (Rosuvastatin Calcium) .... One By Mouth Once Daily For Cholesterol 6)  Hydrochlorothiazide 25 Mg Tabs (Hydrochlorothiazide) .... One By Mouth Once Daily For High Blood Pressure 7)  Centrum Ultra Mens  Tabs (Multiple Vitamins-Minerals) .... Once Daily 8)  Bystolic 5 Mg Tabs (Nebivolol Hcl) .... One By Mouth Once Daily For High Blood Pressure  Allergies (verified): No Known Drug Allergies  Past History:  Past Medical History: Last updated: 04/27/2010 Asthma-  allergic Pneumonia Chronic sinusitis Hx nasal polyps Hyperlipidemia Hypertension Cerebrovascular accident, hx of  Past Surgical History: Last updated: 11/06/09 Nasal polypectomy x 2 Tonsillectomy  Family History: Last updated: 2009/11/06 Heart disease- father died age 80 Mother- died unknown cause- neurologic  Social History: Last updated: 04/27/2010 Patient states former smoker. - 1975 married retired Occupational hygienist, Magazine features editor Alcohol use-yes Drug use-no Regular exercise-yes  Risk Factors: Alcohol Use: 2 (06/10/2010) >5 drinks/d w/in last 3 months: no (06/10/2010) Exercise: yes (04/27/2010)  Risk Factors: Smoking Status: never (06/10/2010) Packs/Day: <1ppd (06/10/2010)  Family History: Reviewed history from November 06, 2009 and no changes required. Heart disease- father died  age 77 Mother- died unknown cause- neurologic  Social History: Reviewed history from 04/27/2010 and no changes required. Patient states former smoker. 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   Neuro:  Denies brief paralysis, difficulty with concentration, disturbances in coordination, falling down, headaches, inability to speak, memory loss, numbness, poor balance, seizures, sensation of room spinning, tingling, tremors, visual disturbances, and weakness.  Physical Exam  General:  alert, well-developed, well-nourished, well-hydrated, appropriate dress, normal appearance, healthy-appearing, and cooperative to examination.   Head:  normocephalic, atraumatic, no abnormalities observed, and no abnormalities palpated.   Mouth:  Oral mucosa and oropharynx without lesions or exudates.  Teeth in good  repair. Neck:  supple, full ROM, no masses, no thyromegaly, no thyroid nodules or tenderness, no JVD, normal carotid upstroke, no carotid bruits, no cervical lymphadenopathy, and no neck tenderness.   Lungs:  normal respiratory effort, no intercostal retractions, no accessory muscle use, normal breath sounds, no dullness, no fremitus, no crackles, and no wheezes.   Heart:  normal rate, regular rhythm, no murmur, no gallop, no rub, and no JVD.   Abdomen:  soft, non-tender, normal bowel sounds, no distention, no masses, no guarding, no rigidity, no rebound tenderness, no abdominal hernia, no inguinal hernia, no hepatomegaly, and no splenomegaly.   Msk:  No deformity or scoliosis noted of thoracic or lumbar spine.   Pulses:  R and L carotid,radial,femoral,dorsalis pedis and posterior tibial pulses are full and equal bilaterally Extremities:  No clubbing, cyanosis, edema, or deformity noted with normal full range of motion of all joints.   Neurologic:  No cranial nerve deficits noted. Station and gait are normal. Plantar reflexes are down-going bilaterally. DTRs are symmetrical throughout. Sensory, motor and coordinative functions appear intact. Skin:  Intact without suspicious lesions or rashes Cervical Nodes:  no anterior cervical adenopathy and no posterior cervical adenopathy.   Psych:  Cognition and judgment appear intact. Alert and cooperative with normal attention span and concentration. No apparent delusions, illusions, hallucinations   Impression & Recommendations:  Problem # 1:  CVA (ICD-434.91) Assessment Improved  Orders: Venipuncture ([address]) TLB-Lipid Panel (80061-LIPID) TLB-BMP (Basic Metabolic Panel-BMET) (80048-METABOL) TLB-CBC Platelet - w/Differential (85025-CBCD) TLB-Hepatic/Liver Function Pnl (80076-HEPATIC) TLB-TSH (Thyroid Stimulating Hormone) (84443-TSH)  Problem # 2:  HYPERTENSION (ICD-401.9) Assessment: Deteriorated  His updated medication list for this problem  includes:    Hydrochlorothiazide 25 Mg Tabs (Hydrochlorothiazide) ..... One by mouth once daily for high blood pressure    Bystolic 5 Mg Tabs (Nebivolol hcl) ..... One by mouth once daily for high blood pressure  Orders: Venipuncture ([address]) TLB-Lipid Panel (80061-LIPID) TLB-BMP (Basic Metabolic Panel-BMET) (80048-METABOL) TLB-CBC Platelet - w/Differential (85025-CBCD) TLB-Hepatic/Liver Function Pnl (80076-HEPATIC) TLB-TSH (Thyroid Stimulating Hormone) (84443-TSH)  BP today: 144/92 Prior BP: 140/70 (05/11/2010)  Prior 10 Yr Risk Heart Disease: Not enough information (04/27/2010)  Labs Reviewed: K+: 4.4 (04/27/2010) Creat: : 1.0 (04/27/2010)   Chol: 290 (04/27/2010)   HDL: 84.40 (04/27/2010)   TG: 97.0 (04/27/2010)  Problem # 3:  HYPERLIPIDEMIA (ICD-272.4) Assessment: Unchanged  His updated medication list for this problem includes:    Crestor 20 Mg Tabs (Rosuvastatin calcium) ..... One by mouth once daily for cholesterol  Orders: Venipuncture ([address]11) TLB-Lipid Panel (80061-LIPID) TLB-BMP (Basic Metabolic Panel-BMET) (80048-METABOL) TLB-CBC Platelet - w/Differential (85025-CBCD) TLB-Hepatic/Liver Function  Pnl (80076-HEPATIC) TLB-TSH (Thyroid Stimulating Hormone) (84443-TSH)  Prior 10 Yr Risk Heart Disease: Not enough information (04/27/2010)   HDL:84.40 (04/27/2010)  Chol:290 (04/27/2010)  Trig:97.0 (04/27/2010)  Complete Medication List: 1)  Prednisone 5 Mg Tabs (Prednisone) .... Take 1 by mouth once daily 2)  Singulair 10 Mg Tabs (Montelukast sodium) .... Take 1 by mouth at bedtime 3)  Advair Diskus 250-50 Mcg/dose Misc (Fluticasone-salmeterol) .... Inhale 1 puff twice daily: rinse mouth well after use 4)  Flonase 50 Mcg/act Susp (Fluticasone propionate) .Marland Kitchen.. 1-2 sprays in each nostril twice daily as needed 5)  Crestor 20 Mg Tabs (Rosuvastatin calcium) .... One by mouth once daily for cholesterol 6)  Hydrochlorothiazide 25 Mg Tabs (Hydrochlorothiazide) .... One by mouth  once daily for high blood pressure 7)  Centrum Ultra Mens Tabs (Multiple vitamins-minerals) .... Once daily 8)  Bystolic 5 Mg Tabs (Nebivolol hcl) .... One by mouth once daily for high blood pressure  Lipid Assessment/Plan:      Based on NCEP/ATP III, the patient's risk factor category is "history of coronary disease, peripheral vascular disease, cerebrovascular disease, or aortic aneurysm".  The patient's lipid goals are as follows: Total cholesterol goal is 200; LDL cholesterol goal is 100; HDL cholesterol goal is 40; Triglyceride goal is 150.     Patient Instructions: 1)  Please schedule a follow-up appointment in 1 month. 2)  Check your Blood Pressure regularly. If it is above 130/80: you should make an appointment. Prescriptions: BYSTOLIC 5 MG TABS (NEBIVOLOL HCL) One by mouth once daily for high blood pressure  #35 x 0   Entered and Authorized by:   Etta Grandchild MD   Signed by:   Etta Grandchild MD on 06/10/2010   Method used:   Samples Given   RxID:   [id number]    Orders Added: 1)  Venipuncture [36415] 2)  TLB-Lipid Panel [80061-LIPID] 3)  TLB-BMP (Basic Metabolic Panel-BMET) [80048-METABOL] 4)  TLB-CBC Platelet - w/Differential [85025-CBCD] 5)  TLB-Hepatic/Liver Function Pnl [80076-HEPATIC] 6)  TLB-TSH (Thyroid Stimulating Hormone) [84443-TSH] 7)  Est. Patient Level IV [14782]

## 2010-06-15 ENCOUNTER — Other Ambulatory Visit: Payer: Medicare Other

## 2010-06-15 ENCOUNTER — Other Ambulatory Visit: Payer: 59

## 2010-06-15 ENCOUNTER — Encounter: Payer: Self-pay | Admitting: Internal Medicine

## 2010-06-15 LAB — CBC WITH DIFFERENTIAL/PLATELET
Basophils Relative: 0.8 % (ref 0.0–3.0)
Hemoglobin: 15.1 g/dL (ref 13.0–17.0)
Lymphocytes Relative: 29.4 % (ref 12.0–46.0)
Lymphs Abs: 1.8 10*3/uL (ref 0.7–4.0)
Neutro Abs: 2.9 10*3/uL (ref 1.4–7.7)
Neutrophils Relative %: 47.2 % (ref 43.0–77.0)
Platelets: 225 10*3/uL (ref 150.0–400.0)

## 2010-06-15 LAB — HEPATIC FUNCTION PANEL
ALT: 20 U/L (ref 0–53)
Albumin: 4 g/dL (ref 3.5–5.2)
Alkaline Phosphatase: 66 U/L (ref 39–117)
Bilirubin, Direct: 0.2 mg/dL (ref 0.0–0.3)
Total Bilirubin: 1 mg/dL (ref 0.3–1.2)
Total Protein: 6.7 g/dL (ref 6.0–8.3)

## 2010-06-15 LAB — BASIC METABOLIC PANEL
BUN: 12 mg/dL (ref 6–23)
Calcium: 9.6 mg/dL (ref 8.4–10.5)
GFR: 90.74 mL/min (ref >60.00)
Glucose, Bld: 91 mg/dL (ref 70–99)
Potassium: 3.6 mEq/L (ref 3.5–5.1)
Sodium: 140 mEq/L (ref 135–145)

## 2010-06-15 LAB — LIPID PANEL: Cholesterol: 144 mg/dL (ref 0–200)

## 2010-06-15 LAB — TSH: TSH: 2.37 u[IU]/mL (ref 0.35–5.50)

## 2010-06-23 NOTE — Letter (Signed)
Summary: Lipid Letter  [facility]  [address] [place], [place] [address]   Phone: [phone]  Fax: [phone]    06/15/2010  Lee Blevins [address] [place], [place]  [address]  Dear Lee Blevins:  We have carefully reviewed your last lipid profile from 06/10/2010 and the results are noted below with a summary of recommendations for lipid management.    Cholesterol:       144     Goal: <200   HDL "good" Cholesterol:   57.84     Goal: >40   LDL "bad" Cholesterol:   54     Goal: <100   Triglycerides:       63.0     Goal: <150    CBC shows a slight elevation in eosinophils but otherwise your labs look good, please follow-up as directed    TLC Diet (Therapeutic Lifestyle Change): Saturated Fats & Transfatty acids should be kept < 7% of total calories ***Reduce Saturated Fats Polyunstaurated Fat can be up to 10% of total calories Monounsaturated Fat Fat can be up to 20% of total calories Total Fat should be no greater than 25-35% of total calories Carbohydrates should be 50-60% of total calories Protein should be approximately 15% of total calories Fiber should be at least 20-30 grams a day ***Increased fiber may help lower LDL Total Cholesterol should be < 200mg /day Consider adding plant stanol/sterols to diet (example: Benacol spread) ***A higher intake of unsaturated fat may reduce Triglycerides and Increase HDL    Adjunctive Measures (may lower LIPIDS and reduce risk of Heart Attack) include: Aerobic Exercise (20-30 minutes 3-4 times a week) Limit Alcohol Consumption Weight Reduction Aspirin 75-81 mg a day by mouth (if not allergic or contraindicated) Dietary Fiber 20-30 grams a day by mouth     Current Medications: 1)    Prednisone 5 Mg Tabs (Prednisone) .... Take 1 by mouth once daily 2)    Singulair 10 Mg Tabs (Montelukast sodium) .... Take 1 by mouth at bedtime 3)    Advair Diskus 250-50 Mcg/dose Misc (Fluticasone-salmeterol) .... Inhale 1 puff  twice daily: rinse mouth well after use 4)    Flonase 50 Mcg/act Susp (Fluticasone propionate) .Marland Kitchen.. 1-2 sprays in each nostril twice daily as needed 5)    Crestor 20 Mg Tabs (Rosuvastatin calcium) .... One by mouth once daily for cholesterol 6)    Hydrochlorothiazide 25 Mg Tabs (Hydrochlorothiazide) .... One by mouth once daily for high blood pressure 7)    Centrum Ultra Mens  Tabs (Multiple vitamins-minerals) .... Once daily 8)    Bystolic 5 Mg Tabs (Nebivolol hcl) .... One by mouth once daily for high blood pressure  If you have any questions, please call. We appreciate being able to work with you.   Sincerely,    [facility] Etta Grandchild MD

## 2010-06-27 LAB — URINALYSIS, ROUTINE W REFLEX MICROSCOPIC
Bilirubin Urine: NEGATIVE
Ketones, ur: NEGATIVE mg/dL
Leukocytes, UA: NEGATIVE
Nitrite: NEGATIVE
Specific Gravity, Urine: 1.015 (ref 1.005–1.030)
Urobilinogen, UA: 0.2 mg/dL (ref 0.0–1.0)

## 2010-06-27 LAB — GLUCOSE, CAPILLARY
Glucose-Capillary: 101 mg/dL — ABNORMAL HIGH (ref 70–99)
Glucose-Capillary: 104 mg/dL — ABNORMAL HIGH (ref 70–99)
Glucose-Capillary: 107 mg/dL — ABNORMAL HIGH (ref 70–99)
Glucose-Capillary: 107 mg/dL — ABNORMAL HIGH (ref 70–99)
Glucose-Capillary: 109 mg/dL — ABNORMAL HIGH (ref 70–99)
Glucose-Capillary: 110 mg/dL — ABNORMAL HIGH (ref 70–99)
Glucose-Capillary: 91 mg/dL (ref 70–99)
Glucose-Capillary: 97 mg/dL (ref 70–99)

## 2010-06-27 LAB — DIFFERENTIAL
Basophils Absolute: 0.1 10*3/uL (ref 0.0–0.1)
Basophils Relative: 1 % (ref 0–1)
Eosinophils Absolute: 0.2 10*3/uL (ref 0.0–0.7)
Lymphocytes Relative: 21 % (ref 12–46)
Lymphs Abs: 1.6 10*3/uL (ref 0.7–4.0)
Monocytes Absolute: 0.8 10*3/uL (ref 0.1–1.0)
Neutro Abs: 5 10*3/uL (ref 1.7–7.7)

## 2010-06-27 LAB — COMPREHENSIVE METABOLIC PANEL
ALT: 22 U/L (ref 0–53)
ALT: 24 U/L (ref 0–53)
AST: 30 U/L (ref 0–37)
Albumin: 4.1 g/dL (ref 3.5–5.2)
Alkaline Phosphatase: 75 U/L (ref 39–117)
Calcium: 9.7 mg/dL (ref 8.4–10.5)
GFR calc Af Amer: >60 mL/min (ref >60)
GFR calc Af Amer: >60 mL/min (ref >60)
GFR calc non Af Amer: >60 mL/min (ref >60)
Sodium: 137 mEq/L (ref 135–145)
Sodium: 140 mEq/L (ref 135–145)
Total Bilirubin: 0.6 mg/dL (ref 0.3–1.2)
Total Bilirubin: 0.8 mg/dL (ref 0.3–1.2)
Total Protein: 6.1 g/dL (ref 6.0–8.3)

## 2010-06-27 LAB — PROTIME-INR
INR: 0.95 (ref 0.00–1.49)
Prothrombin Time: 12.9 seconds (ref 11.6–15.2)

## 2010-06-27 LAB — CBC
Hemoglobin: 14 g/dL (ref 13.0–17.0)
MCH: 31.7 pg (ref 26.0–34.0)
MCH: 32.9 pg (ref 26.0–34.0)
MCHC: 33.5 g/dL (ref 30.0–36.0)
MCHC: 34.3 g/dL (ref 30.0–36.0)
MCV: 95.8 fL (ref 78.0–100.0)
Platelets: 226 10*3/uL (ref 150–400)
RBC: 4.42 MIL/uL (ref 4.22–5.81)
RDW: 12.5 % (ref 11.5–15.5)
RDW: 12.6 % (ref 11.5–15.5)

## 2010-06-27 LAB — SEDIMENTATION RATE: Sed Rate: 12 mm/hr (ref 0–16)

## 2010-07-08 ENCOUNTER — Encounter: Payer: Self-pay | Admitting: Internal Medicine

## 2010-07-08 ENCOUNTER — Ambulatory Visit (INDEPENDENT_AMBULATORY_CARE_PROVIDER_SITE_OTHER): Payer: Medicare Other | Admitting: Internal Medicine

## 2010-07-08 DIAGNOSIS — I6350 Cerebral infarction due to unspecified occlusion or stenosis of unspecified cerebral artery: Secondary | ICD-10-CM

## 2010-07-08 DIAGNOSIS — E785 Hyperlipidemia, unspecified: Secondary | ICD-10-CM

## 2010-07-08 DIAGNOSIS — I10 Essential (primary) hypertension: Secondary | ICD-10-CM

## 2010-07-08 MED ORDER — NEBIVOLOL HCL 10 MG PO TABS: 10.0000 mg | ORAL_TABLET | Freq: Every day | ORAL | Status: DC

## 2010-07-08 NOTE — Progress Notes (Signed)
Subjective:    Patient ID: Lee Blevins, male    DOB: 10-29-1945, 65 y.o.   MRN: [id number]  Hypertension This is a chronic problem. The current episode started more than 1 year ago. The problem has been gradually improving since onset. The problem is controlled. Pertinent negatives include no anxiety, blurred vision, chest pain, headaches, malaise/fatigue, neck pain, orthopnea, palpitations, peripheral edema, PND, shortness of breath or sweats. There are no associated agents to hypertension. Risk factors for coronary artery disease include dyslipidemia. Past treatments include beta blockers and diuretics. The current treatment provides moderate improvement. There are no compliance problems.  Hypertensive end-organ damage includes CVA. There is no history of angina, kidney disease, heart failure, left ventricular hypertrophy, PVD or retinopathy.      Review of Systems  Constitutional: Negative for fever, chills, malaise/fatigue, diaphoresis, activity change, appetite change, fatigue and unexpected weight change.  HENT: Negative for neck pain.   Eyes: Negative for blurred vision, photophobia and visual disturbance.  Respiratory: Negative for cough, chest tightness, shortness of breath, wheezing and stridor.   Cardiovascular: Negative for chest pain, palpitations, orthopnea and PND.  Gastrointestinal: Positive for vomiting. Negative for nausea, abdominal pain and diarrhea.  Genitourinary: Negative for dysuria, urgency and hematuria.  Musculoskeletal: Negative for myalgias, joint swelling, arthralgias and gait problem.  Skin: Negative for color change, pallor and rash.  Neurological: Negative for dizziness, tremors, seizures, syncope, facial asymmetry, speech difficulty, weakness, light-headedness, numbness and headaches.  Hematological: Negative for adenopathy. Does not bruise/bleed easily.  Psychiatric/Behavioral: Negative for suicidal ideas, hallucinations, behavioral problems, confusion, sleep  disturbance, self-injury, dysphoric mood, decreased concentration and agitation. The patient is not nervous/anxious and is not hyperactive.    Lab Results  Component Value Date   WBC 6.1 06/10/2010   HGB 15.1 06/10/2010   HCT 43.7 06/10/2010   PLT 225.0 06/10/2010   CHOL 144 06/10/2010   TRIG 63.0 06/10/2010   HDL 77.50 06/10/2010   LDLDIRECT 192.8 04/27/2010   ALT 20 06/10/2010   AST 25 06/10/2010   NA 140 06/10/2010   K 3.6 06/10/2010   CL 101 06/10/2010   CREATININE 0.9 06/10/2010   BUN 12 06/10/2010   CO2 29 06/10/2010   TSH 2.37 06/10/2010   INR 0.95 04/10/2010   Past Medical History  Diagnosis Date  . Asthma   . Pneumonia   . Chronic sinusitis   . Nasal polyps   . Hyperlipidemia   . HTN (hypertension)   . History of CVA (cerebrovascular accident)    Past Surgical History  Procedure Date  . Nasal polyp excision     x2  . Tonsillectomy     reports that he has quit smoking. He does not have any smokeless tobacco history on file. He reports that he drinks alcohol. He reports that he does not use illicit drugs. family history includes Heart disease in his father. No Known Allergies    Objective:   Physical Exam  [nursing notereviewed. Constitutional: He is oriented to person, place, and time. He appears well-developed and well-nourished. No distress.  HENT:  Head: Normocephalic.  Right Ear: External ear normal.  Left Ear: External ear normal.  Nose: Nose normal.  Mouth/Throat: Oropharynx is clear and moist. No oropharyngeal exudate.  Eyes: Conjunctivae and EOM are normal. Pupils are equal, round, and reactive to light. Right eye exhibits no discharge. Left eye exhibits no discharge. No scleral icterus.  Neck: Normal range of motion. Neck supple. No thyromegaly present.  Cardiovascular: Normal rate, regular rhythm, normal  heart sounds and intact distal pulses.  Exam reveals no gallop and no friction rub.   No murmur heard. Pulmonary/Chest: No respiratory distress. He has no  wheezes. He has no rales. He exhibits no tenderness.  Abdominal: He exhibits no distension and no mass. There is no tenderness. There is no guarding.  Musculoskeletal: He exhibits no edema and no tenderness.  Lymphadenopathy:    He has no cervical adenopathy.  Neurological: He is alert and oriented to person, place, and time. He has normal reflexes. He displays normal reflexes. No cranial nerve deficit. He exhibits normal muscle tone. Coordination normal.  Skin: Skin is warm and dry. No rash noted. He is not diaphoretic. No erythema.  Psychiatric: He has a normal mood and affect. His behavior is normal. Judgment and thought content normal.          Assessment & Plan:

## 2010-07-08 NOTE — Patient Instructions (Signed)

## 2010-07-11 ENCOUNTER — Encounter: Payer: Self-pay | Admitting: Internal Medicine

## 2010-07-25 ENCOUNTER — Telehealth: Payer: Self-pay | Admitting: *Deleted

## 2010-07-25 NOTE — Telephone Encounter (Signed)
Pt's wife called req work in apt soon. C/o "pollen" reaction - lost voice x 1 wk. No open apts, please advise.

## 2010-07-26 ENCOUNTER — Ambulatory Visit: Payer: 59 | Admitting: Internal Medicine

## 2010-07-26 NOTE — Telephone Encounter (Signed)
Patient informed. 

## 2010-07-26 NOTE — Telephone Encounter (Signed)
Start zyrtec 10 mg one po qd this is OTC

## 2010-07-26 NOTE — Telephone Encounter (Signed)
Wife informed

## 2010-08-05 ENCOUNTER — Ambulatory Visit (INDEPENDENT_AMBULATORY_CARE_PROVIDER_SITE_OTHER)
Admission: RE | Admit: 2010-08-05 | Discharge: 2010-08-05 | Disposition: A | Discharge status: Home / Self Care | Payer: Medicare Other | Source: Ambulatory Visit | Attending: Internal Medicine | Admitting: Internal Medicine

## 2010-08-05 ENCOUNTER — Encounter: Payer: Self-pay | Admitting: Internal Medicine

## 2010-08-05 ENCOUNTER — Ambulatory Visit (INDEPENDENT_AMBULATORY_CARE_PROVIDER_SITE_OTHER): Payer: Medicare Other | Admitting: Internal Medicine

## 2010-08-05 VITALS — BP 136/70 | HR 62 | Temp 97.9°F | Resp 16 | Wt 200.2 lb

## 2010-08-05 DIAGNOSIS — J209 Acute bronchitis, unspecified: Secondary | ICD-10-CM

## 2010-08-05 DIAGNOSIS — R05 Cough: Secondary | ICD-10-CM

## 2010-08-05 DIAGNOSIS — J45909 Unspecified asthma, uncomplicated: Secondary | ICD-10-CM

## 2010-08-05 DIAGNOSIS — I10 Essential (primary) hypertension: Secondary | ICD-10-CM

## 2010-08-05 DIAGNOSIS — R059 Cough, unspecified: Secondary | ICD-10-CM

## 2010-08-05 DIAGNOSIS — I6350 Cerebral infarction due to unspecified occlusion or stenosis of unspecified cerebral artery: Secondary | ICD-10-CM

## 2010-08-05 DIAGNOSIS — Z23 Encounter for immunization: Secondary | ICD-10-CM

## 2010-08-05 MED ORDER — CEFUROXIME AXETIL 500 MG PO TABS: 500.0000 mg | ORAL_TABLET | Freq: Two times a day (BID) | ORAL | Status: AC

## 2010-08-05 NOTE — Patient Instructions (Signed)
Bronchitis Bronchitis is the body's way of reacting to injury and/or infection (inflammation) of the bronchi. Bronchi are the air tubes that extend from the windpipe into the lungs. If the inflammation becomes severe, it may cause shortness of breath.  CAUSES Inflammation may be caused by:  A virus.   Germs (bacteria).   Dust.   Allergens.   Pollutants and many other irritants.  The cells lining the bronchial tree are covered with tiny hairs (cilia). These constantly beat upward, away from the lungs, toward the mouth. This keeps the lungs free of pollutants. When these cells become too irritated and are unable to do their job, mucus begins to develop. This causes the characteristic cough of bronchitis. The cough clears the lungs when the cilia are unable to do their job. Without either of these protective mechanisms, the mucus would settle in the lungs. Then you would develop pneumonia. Smoking is a common cause of bronchitis and can contribute to pneumonia. Stopping this habit is the single most important thing you can do to help yourself. TREATMENT  Your caregiver may prescribe an antibiotic if the cough is caused by bacteria. Also, medicines that open up your airways make it easier to breathe. Your caregiver may also recommend or prescribe an expectorant. It will loosen the mucus to be coughed up. Only take over-the-counter or prescription medicines for pain, discomfort, or fever as directed by your caregiver.   Removing whatever causes the problem (smoking, for example) is critical to preventing the problem from getting worse.   Cough suppressants may be prescribed for relief of cough symptoms.   Inhaled medicines may be prescribed to help with symptoms now and to help prevent problems from returning.   For those with recurrent (chronic) bronchitis, there may be a need for steroid medicines.  SEEK IMMEDIATE MEDICAL CARE IF:  During treatment, you develop more pus-like mucus  (purulent sputum).   You or your child has an oral temperature above 100.5, not controlled by medicine.   Your baby is older than 3 months with a rectal temperature of 102 F (38.9 C) or higher.   Your baby is 3 months old or younger with a rectal temperature of 100.4 F (38 C) or higher.   You become progressively more ill.   You have increased difficulty breathing, wheezing, or shortness of breath.  It is necessary to seek immediate medical care if you are elderly or sick from any other disease. MAKE SURE YOU:  Understand these instructions.   Will watch your condition.   Will get help right away if you are not doing well or get worse.  Document Released: 04/03/2005 Document Re-Released: 06/28/2009 [employer] Patient Information 2011 [employer]. 

## 2010-08-05 NOTE — Assessment & Plan Note (Signed)
His BP is well controlled 

## 2010-08-05 NOTE — Assessment & Plan Note (Signed)
He has no s/s of exacerbation

## 2010-08-05 NOTE — Assessment & Plan Note (Signed)
Check a cxr for mass, pna, edema

## 2010-08-05 NOTE — Progress Notes (Signed)
Subjective:    Patient ID: Lee Blevins, male    DOB: [birth year], 65 y.o.   MRN: [id number]  Cough This is a new problem. The current episode started 1 to 4 weeks ago. The problem has been gradually improving. The problem occurs every few hours. The cough is productive of purulent sputum. Pertinent negatives include no chest pain, chills, ear congestion, ear pain, fever, headaches, heartburn, hemoptysis, myalgias, nasal congestion, postnasal drip, rash, rhinorrhea, sore throat, shortness of breath, sweats, weight loss or wheezing. The symptoms are aggravated by pollens. He has tried leukotriene antagonists, steroid inhaler and a beta-agonist inhaler for the symptoms. The treatment provided mild relief. His past medical history is significant for asthma. There is no history of bronchiectasis, bronchitis, COPD, emphysema, environmental allergies or pneumonia.  Hypertension This is a chronic problem. The current episode started more than 1 year ago. The problem has been gradually improving since onset. The problem is uncontrolled. Pertinent negatives include no anxiety, blurred vision, chest pain, headaches, malaise/fatigue, neck pain, orthopnea, palpitations, peripheral edema, PND, shortness of breath or sweats. There are no associated agents to hypertension. The current treatment provides moderate improvement. There are no compliance problems.  Hypertensive end-organ damage includes CVA.      Review of Systems  Constitutional: Negative for fever, chills, weight loss, malaise/fatigue, diaphoresis, activity change, appetite change, fatigue and unexpected weight change.  HENT: Positive for voice change (laryngitis, it is slowly getting better). Negative for ear pain, nosebleeds, congestion, sore throat, facial swelling, rhinorrhea, neck pain, postnasal drip and tinnitus.   Eyes: Negative for blurred vision, pain, discharge and itching.  Respiratory: Positive for cough. Negative for apnea, hemoptysis,  choking, chest tightness, shortness of breath, wheezing and stridor.   Cardiovascular: Negative for chest pain, palpitations, orthopnea, leg swelling and PND.  Gastrointestinal: Negative for heartburn, nausea, vomiting, abdominal pain, diarrhea, constipation, blood in stool and abdominal distention.  Genitourinary: Negative for dysuria, urgency and hematuria.  Musculoskeletal: Negative for myalgias, back pain, joint swelling, arthralgias and gait problem.  Skin: Negative for color change, pallor, rash and wound.  Neurological: Negative for dizziness, tremors, seizures, syncope, facial asymmetry, speech difficulty, weakness, light-headedness, numbness and headaches.  Hematological: Negative for environmental allergies and adenopathy. Does not bruise/bleed easily.  Psychiatric/Behavioral: Negative for behavioral problems, confusion, dysphoric mood and agitation. The patient is not nervous/anxious.        Objective:   Physical Exam  Vitals reviewed. Constitutional: He is oriented to person, place, and time. He appears well-developed and well-nourished. No distress.  HENT:  Head: Normocephalic and atraumatic.  Right Ear: External ear normal.  Left Ear: External ear normal.  Nose: Nose normal.  Mouth/Throat: Oropharynx is clear and moist. No oropharyngeal exudate.  Eyes: Conjunctivae and EOM are normal. Pupils are equal, round, and reactive to light. Right eye exhibits no discharge. Left eye exhibits no discharge. No scleral icterus.  Neck: Normal range of motion. Neck supple. No JVD present. No tracheal deviation present. No thyromegaly present.  Cardiovascular: Normal rate, regular rhythm, normal heart sounds and intact distal pulses.  Exam reveals no gallop and no friction rub.   No murmur heard. Pulmonary/Chest: Effort normal and breath sounds normal. No respiratory distress. He has no wheezes. He has no rales. He exhibits no tenderness.  Abdominal: Soft. Bowel sounds are normal. He  exhibits no distension and no mass. There is no tenderness. There is no rebound and no guarding.  Musculoskeletal: Normal range of motion. He exhibits no edema and no tenderness.  Neurological:  He is alert and oriented to person, place, and time. He has normal reflexes. He displays normal reflexes. No cranial nerve deficit. He exhibits normal muscle tone. Coordination normal.  Skin: Skin is warm and dry. No rash noted. He is not diaphoretic. No erythema. No pallor.  Psychiatric: He has a normal mood and affect. Judgment and thought content normal.          Assessment & Plan:

## 2010-08-05 NOTE — Assessment & Plan Note (Signed)
This has improved.

## 2010-08-05 NOTE — Assessment & Plan Note (Signed)
Start ceftin for bact infections, he does not want a cough suppressant

## 2010-08-19 ENCOUNTER — Other Ambulatory Visit: Payer: Self-pay | Admitting: Internal Medicine

## 2010-08-24 ENCOUNTER — Encounter: Payer: Self-pay | Admitting: Internal Medicine

## 2010-08-26 ENCOUNTER — Ambulatory Visit: Payer: Medicare Other | Admitting: Internal Medicine

## 2010-10-05 ENCOUNTER — Ambulatory Visit (INDEPENDENT_AMBULATORY_CARE_PROVIDER_SITE_OTHER): Payer: Medicare Other | Admitting: Internal Medicine

## 2010-10-05 ENCOUNTER — Encounter: Payer: Self-pay | Admitting: Internal Medicine

## 2010-10-05 DIAGNOSIS — I10 Essential (primary) hypertension: Secondary | ICD-10-CM

## 2010-10-05 DIAGNOSIS — I6350 Cerebral infarction due to unspecified occlusion or stenosis of unspecified cerebral artery: Secondary | ICD-10-CM

## 2010-10-05 NOTE — Assessment & Plan Note (Signed)
No s/s related to this

## 2010-10-05 NOTE — Assessment & Plan Note (Signed)
His BP is well controlled 

## 2010-10-05 NOTE — Patient Instructions (Signed)

## 2010-10-05 NOTE — Progress Notes (Signed)
  Subjective:    Patient ID: Lee Blevins, male    DOB: 06-25-1945, 65 y.o.   MRN: [id number]  Hypertension This is a chronic problem. The current episode started more than 1 year ago. The problem has been gradually improving since onset. The problem is controlled. Pertinent negatives include no anxiety, blurred vision, chest pain, headaches, malaise/fatigue, neck pain, orthopnea, palpitations, peripheral edema, PND, shortness of breath or sweats. There are no associated agents to hypertension. Past treatments include beta blockers and diuretics. The current treatment provides significant improvement. Compliance problems include exercise and diet.       Review of Systems  Constitutional: Negative.  Negative for malaise/fatigue.  HENT: Negative.  Negative for neck pain.   Eyes: Negative.  Negative for blurred vision.  Respiratory: Negative.  Negative for shortness of breath.   Cardiovascular: Negative for chest pain, palpitations, orthopnea and PND.  Gastrointestinal: Negative.   Genitourinary: Negative.   Musculoskeletal: Negative.   Skin: Negative.   Neurological: Negative.  Negative for headaches.  Hematological: Negative.   Psychiatric/Behavioral: Negative.        Objective:   Physical Exam  Vitals reviewed. Constitutional: He is oriented to person, place, and time. He appears well-developed and well-nourished. No distress.  HENT:  Head: Normocephalic and atraumatic.  Right Ear: External ear normal.  Left Ear: External ear normal.  Nose: Nose normal.  Mouth/Throat: Oropharynx is clear and moist. No oropharyngeal exudate.  Eyes: Conjunctivae and EOM are normal. Pupils are equal, round, and reactive to light. Right eye exhibits no discharge. Left eye exhibits no discharge. No scleral icterus.  Neck: Normal range of motion. Neck supple. No JVD present. No tracheal deviation present. No thyromegaly present.  Cardiovascular: Normal rate, regular rhythm, normal heart sounds and  intact distal pulses.  Exam reveals no gallop and no friction rub.   No murmur heard. Pulmonary/Chest: Effort normal and breath sounds normal. No stridor. No respiratory distress. He has no wheezes. He has no rales. He exhibits no tenderness.  Abdominal: Soft. Bowel sounds are normal. He exhibits no distension and no mass. There is no tenderness. There is no rebound and no guarding.  Musculoskeletal: Normal range of motion. He exhibits no edema and no tenderness.  Lymphadenopathy:    He has no cervical adenopathy.  Neurological: He is alert and oriented to person, place, and time. He has normal reflexes. He displays normal reflexes. No cranial nerve deficit. He exhibits normal muscle tone. Coordination normal.  Skin: Skin is warm and dry. No rash noted. He is not diaphoretic. No erythema. No pallor.  Psychiatric: He has a normal mood and affect. His behavior is normal. Judgment and thought content normal.          Assessment & Plan:

## 2010-10-07 ENCOUNTER — Ambulatory Visit: Payer: 59 | Admitting: Internal Medicine

## 2010-11-03 ENCOUNTER — Encounter: Payer: Self-pay | Admitting: Internal Medicine

## 2010-11-03 ENCOUNTER — Ambulatory Visit (INDEPENDENT_AMBULATORY_CARE_PROVIDER_SITE_OTHER): Payer: Medicare Other | Admitting: Internal Medicine

## 2010-11-03 VITALS — BP 130/80 | HR 58 | Ht 71.0 in | Wt 205.2 lb

## 2010-11-03 DIAGNOSIS — J45909 Unspecified asthma, uncomplicated: Secondary | ICD-10-CM

## 2010-11-03 DIAGNOSIS — J330 Polyp of nasal cavity: Secondary | ICD-10-CM

## 2010-11-03 NOTE — Progress Notes (Signed)
Subjective:    Patient ID: Lee Blevins, male    DOB: June 20, 1945, 65 y.o.   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  Has not had significant changes in breathing or nasal symptoms. He is walking 6 miles/ week and tolerating that well. He remains dependent on prednisone 5 mg daily, Advair 1 puff daily, and singulair, nasonex. Coughs up clear mucus at times. No wheeze. Doesn't have a rescue inhaler and doesn't think he needs one- never wheezes now. Review of Systems Constitutional:   No-   weight loss, night sweats, fevers, chills, fatigue, lassitude. HEENT:   No-   headaches, difficulty swallowing, tooth/dental problems, sore throat,                  No-   sneezing, itching, ear ache, nasal congestion, post nasal drip,   CV:  No-   chest pain, orthopnea, PND, swelling in lower extremities, anasarca, dizziness, palpitations  GI:  No-   heartburn, indigestion, abdominal pain, nausea, vomiting, diarrhea,                 change in bowel habits, loss of appetite  Resp: No-   shortness of breath with exertion or at rest.  No-  excess mucus,               No-  coughing up of blood.              No-   change in color of mucus.  No- wheezing.    Skin: No-   rash or lesions.  GU: No-   dysuria, change in color of urine, no urgency or frequency.  No- flank pain.  MS:  No-   joint pain or swelling.  No- decreased range of motion.  No- back pain.  Psych:  No- change in mood or affect. No depression or anxiety.  No memory loss.      Objective:   Physical Exam General- Alert, Oriented, Affect-appropriate, Distress- none acute Skin- rash-none, lesions- none, excoriation- none Lymphadenopathy- none Head- atraumatic            Eyes- Gross vision intact, PERRLA, conjunctivae clear secretions            Ears-  Hearing, canals normal            Nose- Clear, No- Septal dev, mucus, polyps, erosion, perforation             Throat- Mallampati II-III , mucosa clear , drainage- none, tonsils- atrophic Neck- flexible , trachea midline, no stridor , thyroid nl, carotid no bruit Chest - symmetrical excursion , unlabored           Heart/CV- RRR , no murmur , no gallop  , no rub, nl s1 s2                           - JVD- none , edema- none, stasis changes- none, varices- none           Lung- clear to P&A, wheeze- none, cough- none , dullness-none, rub- none           Chest wall-  Abd- tender-no, distended-no, bowel sounds-present, HSM- no Br/ Gen/ Rectal- Not done, not indicated Extrem- cyanosis- none, clubbing, none, atrophy- none, strength- nl Neuro- grossly  intact to observation         Assessment & Plan:

## 2010-11-03 NOTE — Assessment & Plan Note (Addendum)
Good control. We again considered his maintenance prednisone, but at this dose, it is his most effective tool at 5 mg/ day, started by his ENT because of recurrent nasal polyps.  Mild asthma- no acute event in years but satisfied to remain on Advair- discussed options.

## 2010-11-03 NOTE — Assessment & Plan Note (Signed)
None visible today. He is on BASA and not seeing interaction.

## 2010-11-03 NOTE — Patient Instructions (Signed)
Ok to continue present meds  Please call as needed 

## 2010-11-23 ENCOUNTER — Telehealth: Payer: Self-pay

## 2010-11-23 NOTE — Telephone Encounter (Signed)
[facility] Triage Call Report Triage Record Num: [id number] Operator: Edgar Frisk Patient Name: Lee Blevins Call Date & Time: 11/23/2010 12:56:41AM Patient Phone: [phone] PCP: Sanda Linger Patient Gender: Male PCP Fax : Patient DOB: [birth year] Practice Name: [facility] Reason for Call: Colbert Coyer, calling regarding Other. PCP is Sanda Linger. Callback number is [phone]. Reports BP 148/ 73. tonight , was 153/89 earlier, Denies emergent symptoms No other symptoms Care advice per Hypertension Guideline SS for call back reviewed Protocol(s) Used: Hypertension, Diagnosed or Suspected Recommended Outcome per Protocol: See Provider within 72 Hours Reason for Outcome: Multiple elevated blood pressure readings without other symptoms AND no previous work-up OR readings exceed expected range defined by treatment plan Care Advice: It is important to have blood pressure checked at least annually, or at each provider visit, especially if you have a history of high blood pressure in your family. ~ LIFESTYLE MODIFICATION FOR HYPERTENSION: - Weight reduction will help lower blood pressure in individuals who are 10% or more above their ideal body weight. - Eat foods low in saturated fat and sugar, and high in complex carbohydrates (vegetables, fruits, whole grains). - Drink alcohol only in moderate amounts (12 oz beer, 5 oz wine, or 1 1/2 oz of distilled alcohol such as vodka, gin, etc.) and not more than 2 drinks/day for men or 1 drink/day for women or lighter-weight persons. - Get at least 30 minutes of moderate aerobic exercise (using as much energy as walking 2 miles in 30 minutes) most days of the week, preferably daily. - Stop smoking to decrease risk for cardiovascular and pulmonary disease, as well as cancer. - Keep regularly scheduled appointments with your provider. ~ Medication Advice: - Discontinue all nonprescription and alternative medications, especially stimulants,  until evaluated by provider. - Take prescribed medications as directed, following label instructions for the medication. - Do not change medications or dosing regimen until provider is consulted. - Know possible side effects of medication and what to do if they occur. - Tell provider all prescription, nonprescription or alternative medications that you take ~ 11/23/2010 1:03:56AM Page 1 of 1 [id number]

## 2010-11-25 ENCOUNTER — Encounter: Payer: Self-pay | Admitting: Internal Medicine

## 2010-11-25 ENCOUNTER — Other Ambulatory Visit: Payer: Self-pay | Admitting: Internal Medicine

## 2010-11-25 ENCOUNTER — Ambulatory Visit (INDEPENDENT_AMBULATORY_CARE_PROVIDER_SITE_OTHER): Payer: Medicare Other | Admitting: Internal Medicine

## 2010-11-25 DIAGNOSIS — I10 Essential (primary) hypertension: Secondary | ICD-10-CM

## 2010-11-25 NOTE — Patient Instructions (Signed)

## 2010-11-26 NOTE — Progress Notes (Signed)
  Subjective:    Patient ID: Lee Blevins, male    DOB: [birth year], 65 y.o.   MRN: [id number]  Hypertension This is a chronic problem. The current episode started more than 1 year ago. The problem has been gradually improving since onset. The problem is controlled. Pertinent negatives include no anxiety, blurred vision, chest pain, headaches, malaise/fatigue, neck pain, orthopnea, palpitations, peripheral edema, PND, shortness of breath or sweats. There are no associated agents to hypertension. Past treatments include beta blockers and diuretics. There are no compliance problems.       Review of Systems  Constitutional: Negative for fever, chills, malaise/fatigue, diaphoresis, activity change, appetite change, fatigue and unexpected weight change.  HENT: Negative for sore throat, facial swelling, trouble swallowing, neck pain and voice change.   Eyes: Negative for blurred vision, photophobia, pain, discharge, redness, itching and visual disturbance.  Respiratory: Negative for apnea, cough, choking, chest tightness, shortness of breath, wheezing and stridor.   Cardiovascular: Negative for chest pain, palpitations, orthopnea, leg swelling and PND.  Gastrointestinal: Negative for nausea, vomiting, abdominal pain, diarrhea and constipation.  Genitourinary: Negative for dysuria, urgency, frequency, hematuria, flank pain, decreased urine volume, enuresis, difficulty urinating and genital sores.  Musculoskeletal: Negative for myalgias, back pain, joint swelling, arthralgias and gait problem.  Skin: Negative for color change, pallor, rash and wound.  Neurological: Positive for dizziness and light-headedness. Negative for tremors, seizures, syncope, facial asymmetry, speech difficulty, weakness, numbness and headaches.  Hematological: Negative for adenopathy. Does not bruise/bleed easily.  Psychiatric/Behavioral: Negative for suicidal ideas, hallucinations, behavioral problems, confusion, sleep  disturbance, self-injury, dysphoric mood, decreased concentration and agitation. The patient is not nervous/anxious and is not hyperactive.        Objective:   Physical Exam  Vitals reviewed. Constitutional: He is oriented to person, place, and time. He appears well-developed and well-nourished. No distress.  HENT:  Head: Normocephalic and atraumatic.  Right Ear: External ear normal.  Left Ear: External ear normal.  Nose: Nose normal.  Mouth/Throat: Oropharynx is clear and moist. No oropharyngeal exudate.  Eyes: Conjunctivae and EOM are normal. Pupils are equal, round, and reactive to light. Right eye exhibits no discharge. Left eye exhibits no discharge. No scleral icterus.  Neck: Normal range of motion. Neck supple. No JVD present. No tracheal deviation present. No thyromegaly present.  Cardiovascular: Normal rate, regular rhythm, normal heart sounds and intact distal pulses.  Exam reveals no gallop and no friction rub.   No murmur heard. Pulmonary/Chest: Effort normal and breath sounds normal. No stridor. No respiratory distress. He has no wheezes. He has no rales. He exhibits no tenderness.  Abdominal: Soft. Bowel sounds are normal. He exhibits no distension and no mass. There is no tenderness. There is no rebound and no guarding.  Musculoskeletal: Normal range of motion. He exhibits no edema and no tenderness.  Lymphadenopathy:    He has no cervical adenopathy.  Neurological: He is alert and oriented to person, place, and time. He has normal reflexes. He displays normal reflexes. No cranial nerve deficit. He exhibits normal muscle tone. Coordination normal.  Skin: Skin is warm and dry. No rash noted. He is not diaphoretic. No erythema. No pallor.  Psychiatric: He has a normal mood and affect. His behavior is normal. Judgment and thought content normal.          Assessment & Plan:

## 2010-11-26 NOTE — Assessment & Plan Note (Signed)
He is complaining of some orthostatic dizziness so I asked him to be careful when he gets up in the morning or when he stands from a seated position, otherwise I will continue his current meds for now

## 2010-11-28 ENCOUNTER — Telehealth: Payer: Self-pay | Admitting: Internal Medicine

## 2010-11-28 MED ORDER — FLUTICASONE-SALMETEROL 250-50 MCG/DOSE IN AEPB: 1.0000 | INHALATION_SPRAY | Freq: Two times a day (BID) | RESPIRATORY_TRACT | Status: DC

## 2010-11-28 NOTE — Telephone Encounter (Signed)
Refill sent. Jennifer Castillo, CMA  

## 2011-01-17 ENCOUNTER — Ambulatory Visit (INDEPENDENT_AMBULATORY_CARE_PROVIDER_SITE_OTHER): Payer: Medicare Other | Admitting: Internal Medicine

## 2011-01-17 ENCOUNTER — Encounter: Payer: Self-pay | Admitting: Internal Medicine

## 2011-01-17 VITALS — BP 118/80 | HR 56 | Temp 98.4°F | Resp 16 | Wt 202.2 lb

## 2011-01-17 DIAGNOSIS — J209 Acute bronchitis, unspecified: Secondary | ICD-10-CM

## 2011-01-17 DIAGNOSIS — Z23 Encounter for immunization: Secondary | ICD-10-CM

## 2011-01-17 DIAGNOSIS — I10 Essential (primary) hypertension: Secondary | ICD-10-CM

## 2011-01-17 MED ORDER — NEBIVOLOL HCL 5 MG PO TABS: 5.0000 mg | ORAL_TABLET | Freq: Every day | ORAL | Status: DC

## 2011-01-17 MED ORDER — CEFUROXIME AXETIL 500 MG PO TABS: 500.0000 mg | ORAL_TABLET | Freq: Two times a day (BID) | ORAL | Status: AC

## 2011-01-17 NOTE — Patient Instructions (Signed)
Hypertension (High Blood Pressure) As your heart beats, it forces blood through your arteries. This force is your blood pressure. If the pressure is too high, it is called hypertension (HTN) or high blood pressure. HTN is dangerous because you may have it and not know it. High blood pressure may mean that your heart has to work harder to pump blood. Your arteries may be narrow or stiff. The extra work puts you at risk for heart disease, stroke, and other problems.  Blood pressure consists of two numbers, a higher number over a lower, 110/72, for example. It is stated as "110 over 72." The ideal is below 120 for the top number (systolic) and under 80 for the bottom (diastolic). Write down your blood pressure today. You should pay close attention to your blood pressure if you have certain conditions such as:  Heart failure.  Prior heart attack.   Diabetes   Chronic kidney disease.   Prior stroke.   Multiple risk factors for heart disease.   To see if you have HTN, your blood pressure should be measured while you are seated with your arm held at the level of the heart. It should be measured at least twice. A one-time elevated blood pressure reading (especially in the Emergency Department) does not mean that you need treatment. There may be conditions in which the blood pressure is different between your right and left arms. It is important to see your caregiver soon for a recheck. Most people have essential hypertension which means that there is not a specific cause. This type of high blood pressure may be lowered by changing lifestyle factors such as:  Stress.  Smoking.   Lack of exercise.   Excessive weight.  Drug/tobacco/alcohol use.   Eating less salt.   Most people do not have symptoms from high blood pressure until it has caused damage to the body. Effective treatment can often prevent, delay or reduce that damage. TREATMENT Treatment for high blood pressure, when a cause has been  identified, is directed at the cause. There are a large number of medications to treat HTN. These fall into several categories, and your caregiver will help you select the medicines that are best for you. Medications may have side effects. You should review side effects with your caregiver. If your blood pressure stays high after you have made lifestyle changes or started on medicines,   Your medication(s) may need to be changed.   Other problems may need to be addressed.   Be certain you understand your prescriptions, and know how and when to take your medicine.   Be sure to follow up with your caregiver within the time frame advised (usually within two weeks) to have your blood pressure rechecked and to review your medications.   If you are taking more than one medicine to lower your blood pressure, make sure you know how and at what times they should be taken. Taking two medicines at the same time can result in blood pressure that is too low.  SEEK IMMEDIATE MEDICAL CARE IF YOU DEVELOP:  A severe headache, blurred or changing vision, or confusion.   Unusual weakness or numbness, or a faint feeling.   Severe chest or abdominal pain, vomiting, or breathing problems.  MAKE SURE YOU:   Understand these instructions.   Will watch your condition.   Will get help right away if you are not doing well or get worse.  Document Released: 04/03/2005 Document Re-Released: 09/21/2009 [employer] Patient Information 2011 [employer].Acute Bronchitis You have acute bronchitis. This means you have a chest cold. The airways in your lungs are inflamed (red and sore). Acute means it is sudden onset. Bronchitis is most often caused by a virus. In smokers, people with chronic lung problems, and elderly patients, treatment with antibiotics for bacterial infection may be needed. Exposure to cigarette smoke or irritating chemicals will make bronchitis worse. Allergies and asthma can also make bronchitis worse.  Repeated episodes of bronchitis may cause long standing lung problems. Acute bronchitis is usually treated with rest, fluids, and medicines for relief of fever or cough. Bronchodilator medicines from metered inhalers or a nebulizer may be used to help open up the small airways. This reduces shortness of breath and helps control cough. Antibiotics can be prescribed if you are more seriously ill or at risk. A cool air vaporizer may help thin bronchial secretions and make it easier to clear your chest. Increased fluids may also help. You must avoid smoking, even second hand exposure. If you are a cigarette smoker, consider using nicotine gum or skin patches to help control withdrawal symptoms. Recovery from bronchitis is often slow, but you should start feeling better after 2-3 days. Cough from bronchitis frequently lasts for 3-4 weeks.  SEEK IMMEDIATE MEDICAL CARE IF YOU DEVELOP:  Increased fever, chills, or chest pain.   Severe shortness of breath or bloody sputum.   Dehydration, fainting, repeated vomiting, severe headache.   No improvement after one week of proper treatment.  MAKE SURE YOU:   Understand these instructions.   Will watch your condition.   Will get help right away if you are not doing well or get worse.  Document Released: 05/11/2004 Document Re-Released: 03/16/2008 [facility] Patient Information 2011 [place], [place].

## 2011-01-18 NOTE — Assessment & Plan Note (Signed)
He his having some bradycardia so I have decreased the bystolic from 10 to 5 mg per day

## 2011-01-18 NOTE — Assessment & Plan Note (Signed)
It sounds like he is having a bacterial bronchitis so I have prescribed antibiotics

## 2011-01-18 NOTE — Progress Notes (Signed)
Subjective:    Patient ID: Lee Blevins, male    DOB: 1946/03/20, 65 y.o.   MRN: [id number]  Hypertension This is a chronic problem. The current episode started more than 1 year ago. The problem has been gradually improving since onset. The problem is controlled. Pertinent negatives include no anxiety, blurred vision, chest pain, headaches, malaise/fatigue, neck pain, orthopnea, palpitations, peripheral edema, PND, shortness of breath or sweats. Past treatments include diuretics and beta blockers. The current treatment provides significant improvement. Compliance problems include medication side effects (heart rate in the upper 50's and mild dizziness).   URI  This is a new problem. The current episode started 1 to 4 weeks ago. The problem has been gradually worsening. There has been no fever. Associated symptoms include coughing (productive of thick yellow phlegm). Pertinent negatives include no abdominal pain, chest pain, congestion, diarrhea, dysuria, ear pain, headaches, joint pain, joint swelling, nausea, neck pain, plugged ear sensation, rash, rhinorrhea, sinus pain, sneezing, sore throat, swollen glands, vomiting or wheezing. He has tried nothing for the symptoms.      Review of Systems  Constitutional: Negative for fever, chills, malaise/fatigue, diaphoresis, activity change, appetite change, fatigue and unexpected weight change.  HENT: Negative for ear pain, nosebleeds, congestion, sore throat, facial swelling, rhinorrhea, sneezing, trouble swallowing, neck pain, voice change, postnasal drip, sinus pressure and tinnitus.   Eyes: Negative for blurred vision.  Respiratory: Positive for cough (productive of thick yellow phlegm). Negative for apnea, choking, chest tightness, shortness of breath, wheezing and stridor.   Cardiovascular: Negative for chest pain, palpitations, orthopnea, leg swelling and PND.  Gastrointestinal: Negative for nausea, vomiting, abdominal pain, diarrhea, constipation,  blood in stool and anal bleeding.  Genitourinary: Negative.  Negative for dysuria.  Musculoskeletal: Negative for back pain, joint pain, joint swelling, arthralgias and gait problem.  Skin: Negative for color change, pallor, rash and wound.  Neurological: Positive for dizziness. Negative for tremors, seizures, syncope, facial asymmetry, speech difficulty, weakness, light-headedness, numbness and headaches.  Hematological: Negative for adenopathy. Does not bruise/bleed easily.  Psychiatric/Behavioral: Negative.        Objective:   Physical Exam  Vitals reviewed. Constitutional: He is oriented to person, place, and time. He appears well-developed and well-nourished. No distress.  HENT:  Head: Normocephalic and atraumatic.  Right Ear: External ear normal.  Left Ear: External ear normal.  Mouth/Throat: No oropharyngeal exudate.  Eyes: Conjunctivae are normal. Right eye exhibits no discharge. Left eye exhibits no discharge. No scleral icterus.  Neck: Normal range of motion. Neck supple. No JVD present. No tracheal deviation present. No thyromegaly present.  Cardiovascular: Normal rate, regular rhythm, normal heart sounds and intact distal pulses.  Exam reveals no gallop and no friction rub.   No murmur heard. Pulmonary/Chest: Effort normal and breath sounds normal. No stridor. No respiratory distress. He has no wheezes. He has no rales. He exhibits no tenderness.  Abdominal: Soft. Bowel sounds are normal. He exhibits no distension and no mass. There is no tenderness. There is no rebound and no guarding.  Musculoskeletal: Normal range of motion. He exhibits no edema and no tenderness.  Lymphadenopathy:    He has no cervical adenopathy.  Neurological: He is oriented to person, place, and time. He displays normal reflexes. He exhibits normal muscle tone. Coordination normal.  Skin: Skin is warm and dry. No rash noted. He is not diaphoretic. No erythema. No pallor.  Psychiatric: He has a normal  mood and affect. His behavior is normal. Judgment and thought content normal.  Lab Results  Component Value Date   WBC 6.1 06/10/2010   HGB 15.1 06/10/2010   HCT 43.7 06/10/2010   PLT 225.0 06/10/2010   CHOL 144 06/10/2010   TRIG 63.0 06/10/2010   HDL 77.50 06/10/2010   LDLDIRECT 192.8 04/27/2010   ALT 20 06/10/2010   AST 25 06/10/2010   NA 140 06/10/2010   K 3.6 06/10/2010   CL 101 06/10/2010   CREATININE 0.9 06/10/2010   BUN 12 06/10/2010   CO2 29 06/10/2010   TSH 2.37 06/10/2010   INR 0.95 04/10/2010      Assessment & Plan:

## 2011-01-25 ENCOUNTER — Telehealth: Payer: Self-pay

## 2011-01-25 ENCOUNTER — Ambulatory Visit (INDEPENDENT_AMBULATORY_CARE_PROVIDER_SITE_OTHER): Payer: Medicare Other | Admitting: Internal Medicine

## 2011-01-25 ENCOUNTER — Encounter: Payer: Self-pay | Admitting: Internal Medicine

## 2011-01-25 VITALS — BP 138/76 | HR 62 | Temp 97.8°F | Resp 16

## 2011-01-25 DIAGNOSIS — J209 Acute bronchitis, unspecified: Secondary | ICD-10-CM

## 2011-01-25 DIAGNOSIS — I10 Essential (primary) hypertension: Secondary | ICD-10-CM

## 2011-01-25 MED ORDER — AMLODIPINE BESYLATE 5 MG PO TABS: 5.0000 mg | ORAL_TABLET | Freq: Every day | ORAL | Status: DC

## 2011-01-25 NOTE — Assessment & Plan Note (Signed)
This is improving.  

## 2011-01-25 NOTE — Telephone Encounter (Signed)
[facility] Triage Call Report Triage Record Num: [id number] Operator: Edgar Frisk Patient Name: Lee Blevins Call Date & Time: 01/25/2011 12:06:17AM Patient Phone: [phone] PCP: Sanda Linger Patient Gender: Male PCP Fax : Patient DOB: [birth year] Practice Name: [facility] Reason for Call: Colbert Coyer, calling regarding . PCP is Sanda Linger. Callback number is [phone]. Wife calling reports pt BP has been elevated tonight. BP 166/90 with HR 50 , earlier and now is 155/88 with HR 70. Denies emergent symptoms States has recently decreased BP meds . Care advice per Hypertension Guideline SS for call back reviewed To f/u with office in am Protocol(s) Used: Hypertension, Diagnosed or Suspected Recommended Outcome per Protocol: See Provider within 72 Hours Reason for Outcome: Multiple elevated blood pressure readings without other symptoms AND no previous work-up OR readings exceed expected range defined by treatment plan Care Advice: ~ Call provider if systolic BP is 180 or greater, or if diastolic BP is 120 or greater. Avoid the use of stimulants including caffeine (coffee, some soft drinks, some energy drinks, tea and chocolate), cocaine, and amphetamines. Also avoid drinking alcohol. ~ ~ List, or take, all current prescription(s), nonprescription or alternative medication(s) to provider for evaluation. LIFESTYLE MODIFICATION FOR HYPERTENSION: - Weight reduction will help lower blood pressure in individuals who are 10% or more above their ideal body weight. - Eat foods low in saturated fat and sugar, and high in complex carbohydrates (vegetables, fruits, whole grains). - Drink alcohol only in moderate amounts (12 oz beer, 5 oz wine, or 1 1/2 oz of distilled alcohol such as vodka, gin, etc.) and not more than 2 drinks/day for men or 1 drink/day for women or lighter-weight persons. - Get at least 30 minutes of moderate aerobic exercise (using as much energy as walking 2  miles in 30 minutes) most days of the week, preferably daily. - Stop smoking to decrease risk for cardiovascular and pulmonary disease, as well as cancer. - Keep regularly scheduled appointments with your provider. ~ Medication Advice: - Discontinue all nonprescription and alternative medications, especially stimulants, until evaluated by provider. - Take prescribed medications as directed, following label instructions for the medication. - Do not change medications or dosing regimen until provider is consulted. - Know possible side effects of medication and what to do if they occur. - Tell provider all prescription, nonprescription or alternative medications that you take ~ 01/25/2011 12:21:22AM Page 1 of 1 [id number]

## 2011-01-25 NOTE — Assessment & Plan Note (Signed)
They have told me that bystolic is not right for him so I have asked him to slowly taper off of it and to keep an eye open for palpitations, I will change his BP meds to HCTZ + amlodipine and would like to recheck his BP here in about 2-3 weeks, they will let me know of any symptoms or abnormal vital signs.

## 2011-01-25 NOTE — Progress Notes (Signed)
Subjective:    Patient ID: Lee Blevins, male    DOB: Jan 15, 1946, 65 y.o.   MRN: [id number]  HPI He returns for f/up after calling the on-call staff last night (see call-a-nurse notes), his wife had checked his BP and it was 166/90 so she became "very nervous" by her own description and thought something needed to be done, he went on to bed and was feeling well, she wakened him at 3:45 am and his pulse=48 and his BP was down to 145/77. She used strong wording today in telling me that it is unacceptable in her view for his pulse to be that low. He has had some dizziness and they both feel like bystolic is not the right medicine for him. He has felt back to normal today without any symptoms.   Review of Systems  Constitutional: Negative for fever, chills, diaphoresis, activity change, appetite change, fatigue and unexpected weight change.  HENT: Negative.   Eyes: Negative.   Respiratory: Negative for apnea, cough, choking, chest tightness, shortness of breath, wheezing and stridor.   Cardiovascular: Negative for chest pain, palpitations and leg swelling.  Gastrointestinal: Negative.   Genitourinary: Negative.   Musculoskeletal: Negative.   Skin: Negative.   Neurological: Positive for dizziness. Negative for tremors, seizures, syncope, facial asymmetry, speech difficulty, weakness, light-headedness, numbness and headaches.  Hematological: Negative for adenopathy. Does not bruise/bleed easily.  Psychiatric/Behavioral: Negative.        Objective:   Physical Exam  Vitals reviewed. Constitutional: He is oriented to person, place, and time. He appears well-developed and well-nourished. No distress.  HENT:  Mouth/Throat: Oropharynx is clear and moist. No oropharyngeal exudate.  Eyes: Conjunctivae and EOM are normal. Pupils are equal, round, and reactive to light. Right eye exhibits no discharge. Left eye exhibits no discharge. No scleral icterus.  Neck: Normal range of motion. Neck supple. No  JVD present. No tracheal deviation present. No thyromegaly present.  Cardiovascular: Normal rate, regular rhythm, normal heart sounds and intact distal pulses.  Exam reveals no gallop and no friction rub.   No murmur heard. Pulmonary/Chest: Effort normal and breath sounds normal. No stridor. No respiratory distress. He has no wheezes. He has no rales. He exhibits no tenderness.  Abdominal: Soft. Bowel sounds are normal. He exhibits no distension and no mass. There is no tenderness. There is no rebound and no guarding.  Musculoskeletal: Normal range of motion. He exhibits no edema.  Lymphadenopathy:    He has no cervical adenopathy.  Neurological: He is oriented to person, place, and time. He displays normal reflexes. No cranial nerve deficit. He exhibits normal muscle tone. Coordination normal.  Skin: Skin is warm and dry. No rash noted. He is not diaphoretic. No erythema. No pallor.  Psychiatric: He has a normal mood and affect. His behavior is normal. Judgment and thought content normal.      Lab Results  Component Value Date   WBC 6.1 06/10/2010   HGB 15.1 06/10/2010   HCT 43.7 06/10/2010   PLT 225.0 06/10/2010   GLUCOSE 91 06/10/2010   CHOL 144 06/10/2010   TRIG 63.0 06/10/2010   HDL 77.50 06/10/2010   LDLDIRECT 192.8 04/27/2010   LDLCALC 54 06/10/2010   ALT 20 06/10/2010   AST 25 06/10/2010   NA 140 06/10/2010   K 3.6 06/10/2010   CL 101 06/10/2010   CREATININE 0.9 06/10/2010   BUN 12 06/10/2010   CO2 29 06/10/2010   TSH 2.37 06/10/2010   INR 0.95 04/10/2010  Assessment & Plan:

## 2011-01-25 NOTE — Patient Instructions (Signed)

## 2011-02-17 ENCOUNTER — Telehealth: Payer: Self-pay | Admitting: Internal Medicine

## 2011-02-17 ENCOUNTER — Encounter: Payer: Self-pay | Admitting: Internal Medicine

## 2011-02-17 ENCOUNTER — Ambulatory Visit (INDEPENDENT_AMBULATORY_CARE_PROVIDER_SITE_OTHER)
Admission: RE | Admit: 2011-02-17 | Discharge: 2011-02-17 | Disposition: A | Discharge status: Home / Self Care | Payer: Medicare Other | Source: Ambulatory Visit

## 2011-02-17 ENCOUNTER — Ambulatory Visit (INDEPENDENT_AMBULATORY_CARE_PROVIDER_SITE_OTHER): Payer: Medicare Other | Admitting: Internal Medicine

## 2011-02-17 VITALS — BP 142/80 | HR 84 | Temp 97.3°F | Resp 16 | Wt 202.0 lb

## 2011-02-17 DIAGNOSIS — M8580 Other specified disorders of bone density and structure, unspecified site: Secondary | ICD-10-CM

## 2011-02-17 DIAGNOSIS — T380X5A Adverse effect of glucocorticoids and synthetic analogues, initial encounter: Secondary | ICD-10-CM

## 2011-02-17 DIAGNOSIS — Z92241 Personal history of systemic steroid therapy: Secondary | ICD-10-CM | POA: Insufficient documentation

## 2011-02-17 DIAGNOSIS — M949 Disorder of cartilage, unspecified: Secondary | ICD-10-CM

## 2011-02-17 DIAGNOSIS — I10 Essential (primary) hypertension: Secondary | ICD-10-CM

## 2011-02-17 MED ORDER — AMLODIPINE BESYLATE 10 MG PO TABS: 10.0000 mg | ORAL_TABLET | Freq: Every day | ORAL | Status: DC

## 2011-02-17 NOTE — Telephone Encounter (Signed)
The patient is calling because he would like to wean off of the prednisone if CDY agrees. Pls advise. Allergies  Allergen Reactions  . Benicar (Olmesartan Medoxomil)     dizziness  . Bystolic (Nebivolol Hcl)     dizziness

## 2011-02-17 NOTE — Telephone Encounter (Signed)
LMOMTCB x 1 

## 2011-02-17 NOTE — Telephone Encounter (Signed)
Spoke with patient-aware of CY's recs and will call if any troubles after stopping prednisone.

## 2011-02-17 NOTE — Progress Notes (Signed)
  Subjective:    Patient ID: Lee Blevins, male    DOB: 07/30/1945, 65 y.o.   MRN: [id number]  Hypertension This is a chronic problem. The current episode started more than 1 year ago. The problem has been gradually improving since onset. The problem is uncontrolled. Pertinent negatives include no anxiety, blurred vision, chest pain, headaches, malaise/fatigue, neck pain, orthopnea, palpitations, peripheral edema, PND, shortness of breath or sweats. Past treatments include calcium channel blockers and diuretics. The current treatment provides moderate improvement. Compliance problems include exercise and diet.       Review of Systems  Constitutional: Negative for fever, chills, malaise/fatigue, diaphoresis, activity change, appetite change, fatigue and unexpected weight change.  HENT: Negative.  Negative for neck pain.   Eyes: Negative.  Negative for blurred vision.  Respiratory: Negative for cough, shortness of breath, wheezing and stridor.   Cardiovascular: Negative for chest pain, palpitations, orthopnea, leg swelling and PND.  Gastrointestinal: Negative for nausea, vomiting, abdominal pain and diarrhea.  Genitourinary: Negative.  Negative for dysuria, urgency, frequency, hematuria, decreased urine volume, enuresis and difficulty urinating.  Musculoskeletal: Negative for myalgias, back pain, joint swelling, arthralgias and gait problem.  Skin: Negative for color change, pallor and wound.  Neurological: Negative for dizziness, tremors, seizures, syncope, facial asymmetry, speech difficulty, weakness, light-headedness, numbness and headaches.  Hematological: Negative for adenopathy. Does not bruise/bleed easily.  Psychiatric/Behavioral: Negative.        Objective:   Physical Exam  Vitals reviewed. Constitutional: He appears well-developed and well-nourished. No distress.  HENT:  Head: Normocephalic and atraumatic.  Mouth/Throat: Oropharynx is clear and moist. No oropharyngeal exudate.    Eyes: Conjunctivae are normal. Right eye exhibits no discharge. Left eye exhibits no discharge. No scleral icterus.  Neck: Normal range of motion. Neck supple. No JVD present. No tracheal deviation present. No thyromegaly present.  Cardiovascular: Normal rate, regular rhythm, normal heart sounds and intact distal pulses.  Exam reveals no gallop and no friction rub.   No murmur heard. Pulmonary/Chest: Effort normal and breath sounds normal. No stridor. No respiratory distress. He has no wheezes. He has no rales. He exhibits no tenderness.  Abdominal: Soft. Bowel sounds are normal. He exhibits no distension and no mass. There is no tenderness. There is no rebound and no guarding.  Musculoskeletal: Normal range of motion. He exhibits no edema and no tenderness.  Lymphadenopathy:    He has no cervical adenopathy.  Skin: Skin is warm and dry. No rash noted. He is not diaphoretic. No erythema. No pallor.  Psychiatric: He has a normal mood and affect. His behavior is normal. Judgment and thought content normal.      Lab Results  Component Value Date   WBC 6.1 06/10/2010   HGB 15.1 06/10/2010   HCT 43.7 06/10/2010   PLT 225.0 06/10/2010   GLUCOSE 91 06/10/2010   CHOL 144 06/10/2010   TRIG 63.0 06/10/2010   HDL 77.50 06/10/2010   LDLDIRECT 192.8 04/27/2010   LDLCALC 54 06/10/2010   ALT 20 06/10/2010   AST 25 06/10/2010   NA 140 06/10/2010   K 3.6 06/10/2010   CL 101 06/10/2010   CREATININE 0.9 06/10/2010   BUN 12 06/10/2010   CO2 29 06/10/2010   TSH 2.37 06/10/2010   INR 0.95 04/10/2010      Assessment & Plan:

## 2011-02-17 NOTE — Assessment & Plan Note (Signed)
His BP is still higher than we want so I have asked him to increase the amlodipine to 10 mg per day

## 2011-02-17 NOTE — Patient Instructions (Signed)

## 2011-02-17 NOTE — Assessment & Plan Note (Signed)
He tells me that he has taken low dose steroids for 15 years and that he needs to have his BMD checked

## 2011-02-17 NOTE — Telephone Encounter (Signed)
Yes- suggest he try taking prednisone every other day for 2 weeks, then stop.

## 2011-02-24 ENCOUNTER — Encounter: Payer: Self-pay | Admitting: Internal Medicine

## 2011-02-27 ENCOUNTER — Encounter: Payer: Self-pay | Admitting: Internal Medicine

## 2011-03-01 ENCOUNTER — Ambulatory Visit: Payer: Medicare Other | Admitting: Internal Medicine

## 2011-03-02 ENCOUNTER — Other Ambulatory Visit: Payer: Self-pay | Admitting: *Deleted

## 2011-03-02 MED ORDER — FLUTICASONE-SALMETEROL 250-50 MCG/DOSE IN AEPB: 1.0000 | INHALATION_SPRAY | Freq: Two times a day (BID) | RESPIRATORY_TRACT | Status: DC

## 2011-03-02 NOTE — Telephone Encounter (Signed)
Received faxed refill request for 90 day supply.

## 2011-03-20 ENCOUNTER — Encounter: Payer: Self-pay | Admitting: Internal Medicine

## 2011-03-20 ENCOUNTER — Ambulatory Visit (INDEPENDENT_AMBULATORY_CARE_PROVIDER_SITE_OTHER): Payer: Medicare Other | Admitting: Internal Medicine

## 2011-03-20 VITALS — BP 132/74 | HR 78 | Temp 97.8°F | Resp 16

## 2011-03-20 DIAGNOSIS — I10 Essential (primary) hypertension: Secondary | ICD-10-CM

## 2011-03-20 DIAGNOSIS — J019 Acute sinusitis, unspecified: Secondary | ICD-10-CM | POA: Insufficient documentation

## 2011-03-20 MED ORDER — CEFUROXIME AXETIL 500 MG PO TABS: 500.0000 mg | ORAL_TABLET | Freq: Two times a day (BID) | ORAL | Status: AC

## 2011-03-20 NOTE — Assessment & Plan Note (Signed)
Start ceftin for the infection 

## 2011-03-20 NOTE — Progress Notes (Signed)
Subjective:    Patient ID: Lee Blevins, male    DOB: 01/01/1946, 65 y.o.   MRN: [id number]  Sinusitis This is a recurrent problem. The current episode started 1 to 4 weeks ago. The problem has been gradually worsening since onset. There has been no fever. His pain is at a severity of 1/10. The pain is mild. Associated symptoms include chills and sinus pressure. Pertinent negatives include no congestion, coughing, diaphoresis, ear pain, headaches, hoarse voice, neck pain, shortness of breath, sneezing, sore throat or swollen glands.  Hypertension This is a chronic problem. The current episode started more than 1 year ago. The problem has been gradually improving since onset. The problem is controlled. Pertinent negatives include no anxiety, blurred vision, chest pain, headaches, malaise/fatigue, neck pain, orthopnea, palpitations, peripheral edema, PND, shortness of breath or sweats. Past treatments include calcium channel blockers and diuretics. The current treatment provides significant improvement. There are no compliance problems.       Review of Systems  Constitutional: Positive for chills. Negative for fever, malaise/fatigue, diaphoresis, activity change, appetite change, fatigue and unexpected weight change.  HENT: Positive for rhinorrhea, postnasal drip and sinus pressure. Negative for ear pain, nosebleeds, congestion, sore throat, hoarse voice, sneezing, neck pain and voice change.   Eyes: Negative.  Negative for blurred vision.  Respiratory: Negative for cough, shortness of breath, wheezing and stridor.   Cardiovascular: Negative for chest pain, palpitations, orthopnea, leg swelling and PND.  Gastrointestinal: Negative for nausea, vomiting, abdominal pain, diarrhea, constipation and blood in stool.  Genitourinary: Negative for dysuria, urgency, frequency, hematuria, flank pain, decreased urine volume, enuresis and difficulty urinating.  Musculoskeletal: Negative for myalgias, back pain,  joint swelling, arthralgias and gait problem.  Skin: Negative for color change, pallor, rash and wound.  Neurological: Negative for dizziness, tremors, seizures, syncope, facial asymmetry, speech difficulty, weakness, light-headedness, numbness and headaches.  Hematological: Negative for adenopathy. Does not bruise/bleed easily.  Psychiatric/Behavioral: Negative.        Objective:   Physical Exam  Constitutional: He is oriented to person, place, and time. He appears well-developed and well-nourished. No distress.  HENT:  Head: No trismus in the jaw.  Right Ear: Hearing, tympanic membrane, external ear and ear canal normal.  Left Ear: Hearing, tympanic membrane, external ear and ear canal normal.  Nose: Mucosal edema and rhinorrhea present. No nose lacerations, sinus tenderness, nasal deformity, septal deviation or nasal septal hematoma. No epistaxis.  No foreign bodies. Right sinus exhibits maxillary sinus tenderness. Right sinus exhibits no frontal sinus tenderness. Left sinus exhibits maxillary sinus tenderness. Left sinus exhibits no frontal sinus tenderness.  Mouth/Throat: Oropharynx is clear and moist and mucous membranes are normal. Mucous membranes are not pale, not dry and not cyanotic. No uvula swelling. No oropharyngeal exudate, posterior oropharyngeal edema, posterior oropharyngeal erythema or tonsillar abscesses.  Eyes: Conjunctivae are normal. Right eye exhibits no discharge. Left eye exhibits no discharge. No scleral icterus.  Neck: Normal range of motion. Neck supple. No JVD present. No tracheal deviation present. No thyromegaly present.  Cardiovascular: Normal rate, regular rhythm, normal heart sounds and intact distal pulses.  Exam reveals no gallop and no friction rub.   No murmur heard. Pulmonary/Chest: Effort normal and breath sounds normal. No stridor. No respiratory distress. He has no wheezes. He has no rales. He exhibits no tenderness.  Abdominal: Soft. Bowel sounds are  normal. He exhibits no distension. There is no tenderness. There is no rebound and no guarding.  Musculoskeletal: Normal range of motion. He  exhibits no edema and no tenderness.  Lymphadenopathy:    He has no cervical adenopathy.  Neurological: He is oriented to person, place, and time.  Skin: Skin is warm and dry. No rash noted. He is not diaphoretic. No erythema. No pallor.  Psychiatric: He has a normal mood and affect. His behavior is normal. Judgment and thought content normal.      Lab Results  Component Value Date   WBC 6.1 06/10/2010   HGB 15.1 06/10/2010   HCT 43.7 06/10/2010   PLT 225.0 06/10/2010   GLUCOSE 91 06/10/2010   CHOL 144 06/10/2010   TRIG 63.0 06/10/2010   HDL 77.50 06/10/2010   LDLDIRECT 192.8 04/27/2010   LDLCALC 54 06/10/2010   ALT 20 06/10/2010   AST 25 06/10/2010   NA 140 06/10/2010   K 3.6 06/10/2010   CL 101 06/10/2010   CREATININE 0.9 06/10/2010   BUN 12 06/10/2010   CO2 29 06/10/2010   TSH 2.37 06/10/2010   INR 0.95 04/10/2010      Assessment & Plan:

## 2011-03-20 NOTE — Patient Instructions (Signed)
Hypertension As your heart beats, it forces blood through your arteries. This force is your blood pressure. If the pressure is too high, it is called hypertension (HTN) or high blood pressure. HTN is dangerous because you may have it and not know it. High blood pressure may mean that your heart has to work harder to pump blood. Your arteries may be narrow or stiff. The extra work puts you at risk for heart disease, stroke, and other problems.  Blood pressure consists of two numbers, a higher number over a lower, 110/72, for example. It is stated as "110 over 72." The ideal is below 120 for the top number (systolic) and under 80 for the bottom (diastolic). Write down your blood pressure today. You should pay close attention to your blood pressure if you have certain conditions such as:  Heart failure.   Prior heart attack.   Diabetes   Chronic kidney disease.   Prior stroke.   Multiple risk factors for heart disease.  To see if you have HTN, your blood pressure should be measured while you are seated with your arm held at the level of the heart. It should be measured at least twice. A one-time elevated blood pressure reading (especially in the Emergency Department) does not mean that you need treatment. There may be conditions in which the blood pressure is different between your right and left arms. It is important to see your caregiver soon for a recheck. Most people have essential hypertension which means that there is not a specific cause. This type of high blood pressure may be lowered by changing lifestyle factors such as:  Stress.   Smoking.   Lack of exercise.   Excessive weight.   Drug/tobacco/alcohol use.   Eating less salt.  Most people do not have symptoms from high blood pressure until it has caused damage to the body. Effective treatment can often prevent, delay or reduce that damage. TREATMENT  When a cause has been identified, treatment for high blood pressure is  directed at the cause. There are a large number of medications to treat HTN. These fall into several categories, and your caregiver will help you select the medicines that are best for you. Medications may have side effects. You should review side effects with your caregiver. If your blood pressure stays high after you have made lifestyle changes or started on medicines,   Your medication(s) may need to be changed.   Other problems may need to be addressed.   Be certain you understand your prescriptions, and know how and when to take your medicine.   Be sure to follow up with your caregiver within the time frame advised (usually within two weeks) to have your blood pressure rechecked and to review your medications.   If you are taking more than one medicine to lower your blood pressure, make sure you know how and at what times they should be taken. Taking two medicines at the same time can result in blood pressure that is too low.  SEEK IMMEDIATE MEDICAL CARE IF:  You develop a severe headache, blurred or changing vision, or confusion.   You have unusual weakness or numbness, or a faint feeling.   You have severe chest or abdominal pain, vomiting, or breathing problems.  MAKE SURE YOU:   Understand these instructions.   Will watch your condition.   Will get help right away if you are not doing well or get worse.  Document Released: 04/03/2005 Document Revised: 12/14/2010 Document Reviewed:   11/22/2007 [employer] Patient Information 2012 [employer].Sinusitis Sinuses are air pockets within the bones of your face. The growth of bacteria within a sinus leads to infection. The infection prevents the sinuses from draining. This infection is called sinusitis. SYMPTOMS  There will be different areas of pain depending on which sinuses have become infected.  The maxillary sinuses often produce pain beneath the eyes.   Frontal sinusitis may cause pain in the middle of the forehead and above  the eyes.  Other problems (symptoms) include:  Toothaches.   Colored, pus-like (purulent) drainage from the nose.   Swelling, warmth, and tenderness over the sinus areas may be signs of infection.  TREATMENT  Sinusitis is most often determined by an exam.X-rays may be taken. If x-rays have been taken, make sure you obtain your results or find out how you are to obtain them. Your caregiver may give you medications (antibiotics). These are medications that will help kill the bacteria causing the infection. You may also be given a medication (decongestant) that helps to reduce sinus swelling.  HOME CARE INSTRUCTIONS   Only take over-the-counter or prescription medicines for pain, discomfort, or fever as directed by your caregiver.   Drink extra fluids. Fluids help thin the mucus so your sinuses can drain more easily.   Applying either moist heat or ice packs to the sinus areas may help relieve discomfort.   Use saline nasal sprays to help moisten your sinuses. The sprays can be found at your local drugstore.  SEEK IMMEDIATE MEDICAL CARE IF:  You have a fever.   You have increasing pain, severe headaches, or toothache.   You have nausea, vomiting, or drowsiness.   You develop unusual swelling around the face or trouble seeing.  MAKE SURE YOU:   Understand these instructions.   Will watch your condition.   Will get help right away if you are not doing well or get worse.  Document Released: 04/03/2005 Document Revised: 12/14/2010 Document Reviewed: 10/31/2006 [employer] Patient Information 2012 [employer]. 

## 2011-03-20 NOTE — Assessment & Plan Note (Signed)
His BP is well controlled 

## 2011-04-04 ENCOUNTER — Telehealth: Payer: Self-pay

## 2011-04-04 NOTE — Telephone Encounter (Signed)
Patient called stating that he was seen by MD, given antibiotic but no still no better. Pt c/o continued sinus congestion and pressure. Thanks

## 2011-04-04 NOTE — Telephone Encounter (Signed)
Ask him to come in for a steroid injection and/or samples of a NEW nasal spray

## 2011-04-05 ENCOUNTER — Ambulatory Visit (INDEPENDENT_AMBULATORY_CARE_PROVIDER_SITE_OTHER): Payer: Medicare Other | Admitting: Internal Medicine

## 2011-04-05 ENCOUNTER — Encounter: Payer: Self-pay | Admitting: Internal Medicine

## 2011-04-05 ENCOUNTER — Ambulatory Visit: Payer: Medicare Other | Admitting: Internal Medicine

## 2011-04-05 DIAGNOSIS — J328 Other chronic sinusitis: Secondary | ICD-10-CM

## 2011-04-05 DIAGNOSIS — I10 Essential (primary) hypertension: Secondary | ICD-10-CM

## 2011-04-05 MED ORDER — METHYLPREDNISOLONE ACETATE 80 MG/ML IJ SUSP
120.0000 mg | Freq: Once | INTRAMUSCULAR | Status: AC
  Administered 2011-04-05: 120 mg via INTRAMUSCULAR

## 2011-04-05 MED ORDER — AZELASTINE-FLUTICASONE 137-50 MCG/ACT NA SUSP: 1.0000 | Freq: Two times a day (BID) | NASAL | Status: DC

## 2011-04-05 NOTE — Assessment & Plan Note (Signed)
I think the infection hs cleared but he has refractory allergic symptoms so we gave him a dose of depo-medrol IM today and I have changed his NS to dymista for better symptoms relief

## 2011-04-05 NOTE — Patient Instructions (Signed)
Hypertension As your heart beats, it forces blood through your arteries. This force is your blood pressure. If the pressure is too high, it is called hypertension (HTN) or high blood pressure. HTN is dangerous because you may have it and not know it. High blood pressure may mean that your heart has to work harder to pump blood. Your arteries may be narrow or stiff. The extra work puts you at risk for heart disease, stroke, and other problems.  Blood pressure consists of two numbers, a higher number over a lower, 110/72, for example. It is stated as "110 over 72." The ideal is below 120 for the top number (systolic) and under 80 for the bottom (diastolic). Write down your blood pressure today. You should pay close attention to your blood pressure if you have certain conditions such as:  Heart failure.   Prior heart attack.   Diabetes   Chronic kidney disease.   Prior stroke.   Multiple risk factors for heart disease.  To see if you have HTN, your blood pressure should be measured while you are seated with your arm held at the level of the heart. It should be measured at least twice. A one-time elevated blood pressure reading (especially in the Emergency Department) does not mean that you need treatment. There may be conditions in which the blood pressure is different between your right and left arms. It is important to see your caregiver soon for a recheck. Most people have essential hypertension which means that there is not a specific cause. This type of high blood pressure may be lowered by changing lifestyle factors such as:  Stress.   Smoking.   Lack of exercise.   Excessive weight.   Drug/tobacco/alcohol use.   Eating less salt.  Most people do not have symptoms from high blood pressure until it has caused damage to the body. Effective treatment can often prevent, delay or reduce that damage. TREATMENT  When a cause has been identified, treatment for high blood pressure is  directed at the cause. There are a large number of medications to treat HTN. These fall into several categories, and your caregiver will help you select the medicines that are best for you. Medications may have side effects. You should review side effects with your caregiver. If your blood pressure stays high after you have made lifestyle changes or started on medicines,   Your medication(s) may need to be changed.   Other problems may need to be addressed.   Be certain you understand your prescriptions, and know how and when to take your medicine.   Be sure to follow up with your caregiver within the time frame advised (usually within two weeks) to have your blood pressure rechecked and to review your medications.   If you are taking more than one medicine to lower your blood pressure, make sure you know how and at what times they should be taken. Taking two medicines at the same time can result in blood pressure that is too low.  SEEK IMMEDIATE MEDICAL CARE IF:  You develop a severe headache, blurred or changing vision, or confusion.   You have unusual weakness or numbness, or a faint feeling.   You have severe chest or abdominal pain, vomiting, or breathing problems.  MAKE SURE YOU:   Understand these instructions.   Will watch your condition.   Will get help right away if you are not doing well or get worse.  Document Released: 04/03/2005 Document Revised: 12/14/2010 Document Reviewed:   11/22/2007 [employer] Patient Information 2012 [place], [place].Allergic Rhinitis Allergic rhinitis is when the mucous membranes in the nose respond to allergens. Allergens are particles in the air that cause your body to have an allergic reaction. This causes you to release allergic antibodies. Through a chain of events, these eventually cause you to release histamine into the blood stream (hence the use of antihistamines). Although meant to be protective to the body, it is this release that causes your  discomfort, such as frequent sneezing, congestion and an itchy runny nose.  CAUSES  The pollen allergens may come from grasses, trees, and weeds. This is seasonal allergic rhinitis, or "hay fever." Other allergens cause year-round allergic rhinitis (perennial allergic rhinitis) such as house dust mite allergen, pet dander and mold spores.  SYMPTOMS   Nasal stuffiness (congestion).   Runny, itchy nose with sneezing and tearing of the eyes.   There is often an itching of the mouth, eyes and ears.  It cannot be cured, but it can be controlled with medications. DIAGNOSIS  If you are unable to determine the offending allergen, skin or blood testing may find it. TREATMENT   Avoid the allergen.   Medications and allergy shots (immunotherapy) can help.   Hay fever may often be treated with antihistamines in pill or nasal spray forms. Antihistamines block the effects of histamine. There are over-the-counter medicines that may help with nasal congestion and swelling around the eyes. Check with your caregiver before taking or giving this medicine.  If the treatment above does not work, there are many new medications your caregiver can prescribe. Stronger medications may be used if initial measures are ineffective. Desensitizing injections can be used if medications and avoidance fails. Desensitization is when a patient is given ongoing shots until the body becomes less sensitive to the allergen. Make sure you follow up with your caregiver if problems continue. SEEK MEDICAL CARE IF:   You develop fever (more than 100.5 F (38.1 C).   You develop a cough that does not stop easily (persistent).   You have shortness of breath.   You start wheezing.   Symptoms interfere with normal daily activities.  Document Released: 12/27/2000 Document Revised: 12/14/2010 Document Reviewed: 07/08/2008 [facility] Patient Information 2012 [place], [place].

## 2011-04-05 NOTE — Assessment & Plan Note (Signed)
His BP is well controlled but he has developed leg edema which is most like caused by the CCB therapy + poor diet and lifestyle choices, we discussed stopping the CCB but he is not bothered by the edema so he will improve lifestyle modifications and elevated his legs and will let me know if he decides to stop the CCB

## 2011-04-05 NOTE — Progress Notes (Signed)
Subjective:    Patient ID: Lee Blevins, male    DOB: 11-28-1945, 65 y.o.   MRN: [id number]  Hypertension This is a chronic problem. The current episode started more than 1 year ago. The problem has been gradually improving since onset. The problem is controlled. Associated symptoms include peripheral edema. Pertinent negatives include no anxiety, blurred vision, chest pain, headaches, malaise/fatigue, neck pain, orthopnea, palpitations, PND, shortness of breath or sweats. Agents associated with hypertension include steroids. Past treatments include calcium channel blockers and diuretics. The current treatment provides moderate improvement. Compliance problems include exercise and diet.       Review of Systems  Constitutional: Negative for fever, chills, malaise/fatigue, diaphoresis, activity change, appetite change, fatigue and unexpected weight change.  HENT: Positive for ear pain (left ear is popping), congestion, rhinorrhea and postnasal drip. Negative for hearing loss, nosebleeds, sore throat, facial swelling, sneezing, drooling, mouth sores, trouble swallowing, neck pain, neck stiffness, dental problem, voice change, sinus pressure, tinnitus and ear discharge.   Eyes: Negative.  Negative for blurred vision.  Respiratory: Negative for cough, chest tightness, shortness of breath, wheezing and stridor.   Cardiovascular: Positive for leg swelling. Negative for chest pain, palpitations, orthopnea and PND.  Gastrointestinal: Negative.   Genitourinary: Negative.   Musculoskeletal: Negative.   Skin: Negative.   Neurological: Negative for dizziness, tremors, seizures, syncope, facial asymmetry, speech difficulty, weakness, light-headedness, numbness and headaches.  Hematological: Negative for adenopathy. Does not bruise/bleed easily.  Psychiatric/Behavioral: Negative.        Objective:   Physical Exam  Vitals reviewed. Constitutional: He is oriented to person, place, and time. He appears  well-developed and well-nourished. No distress.  HENT:  Head: Normocephalic and atraumatic. No trismus in the jaw.  Right Ear: Hearing, tympanic membrane, external ear and ear canal normal.  Left Ear: Hearing, tympanic membrane, external ear and ear canal normal.  Nose: Mucosal edema present. No rhinorrhea, nose lacerations, sinus tenderness, nasal deformity, septal deviation or nasal septal hematoma. No epistaxis.  No foreign bodies. Right sinus exhibits no maxillary sinus tenderness and no frontal sinus tenderness. Left sinus exhibits no maxillary sinus tenderness and no frontal sinus tenderness.  Mouth/Throat: Oropharynx is clear and moist and mucous membranes are normal. Mucous membranes are not pale, not dry and not cyanotic. No uvula swelling. No oropharyngeal exudate, posterior oropharyngeal edema, posterior oropharyngeal erythema or tonsillar abscesses.  Eyes: Conjunctivae are normal. Right eye exhibits no discharge. Left eye exhibits no discharge. No scleral icterus.  Neck: Normal range of motion. Neck supple. No JVD present. No tracheal deviation present. No thyromegaly present.  Cardiovascular: Normal rate, regular rhythm, normal heart sounds and intact distal pulses.  Exam reveals no friction rub.   No murmur heard. Pulmonary/Chest: Effort normal and breath sounds normal. No stridor. No respiratory distress. He has no wheezes. He has no rales. He exhibits no tenderness.  Abdominal: Soft. Bowel sounds are normal. He exhibits no distension. There is no tenderness. There is no rebound and no guarding.  Musculoskeletal: Normal range of motion. He exhibits edema (1+ edema in both legs). He exhibits no tenderness.  Lymphadenopathy:    He has no cervical adenopathy.  Neurological: He is oriented to person, place, and time.  Skin: Skin is warm and dry. No rash noted. He is not diaphoretic. No erythema. No pallor.  Psychiatric: He has a normal mood and affect. His behavior is normal. Judgment  and thought content normal.      Lab Results  Component Value Date  WBC 6.1 06/10/2010   HGB 15.1 06/10/2010   HCT 43.7 06/10/2010   PLT 225.0 06/10/2010   GLUCOSE 91 06/10/2010   CHOL 144 06/10/2010   TRIG 63.0 06/10/2010   HDL 77.50 06/10/2010   LDLDIRECT 192.8 04/27/2010   LDLCALC 54 06/10/2010   ALT 20 06/10/2010   AST 25 06/10/2010   NA 140 06/10/2010   K 3.6 06/10/2010   CL 101 06/10/2010   CREATININE 0.9 06/10/2010   BUN 12 06/10/2010   CO2 29 06/10/2010   TSH 2.37 06/10/2010   INR 0.95 04/10/2010      Assessment & Plan:

## 2011-04-05 NOTE — Telephone Encounter (Signed)
appt set

## 2011-05-03 ENCOUNTER — Encounter: Payer: Self-pay | Admitting: Internal Medicine

## 2011-05-03 ENCOUNTER — Ambulatory Visit (INDEPENDENT_AMBULATORY_CARE_PROVIDER_SITE_OTHER): Payer: Medicare Other | Admitting: Internal Medicine

## 2011-05-03 VITALS — BP 134/84 | HR 73 | Temp 97.5°F | Resp 14 | Wt 205.5 lb

## 2011-05-03 DIAGNOSIS — I10 Essential (primary) hypertension: Secondary | ICD-10-CM

## 2011-05-03 NOTE — Progress Notes (Signed)
  Subjective:    Patient ID: Lee Blevins, male    DOB: 22-Jan-1946, 66 y.o.   MRN: [id number]  Hypertension This is a chronic problem. The current episode started more than 1 year ago. The problem has been gradually improving since onset. The problem is controlled. Pertinent negatives include no anxiety, blurred vision, chest pain, headaches, malaise/fatigue, neck pain, orthopnea, palpitations, peripheral edema, PND, shortness of breath or sweats. There are no associated agents to hypertension. Past treatments include calcium channel blockers and diuretics. The current treatment provides moderate improvement. Compliance problems include exercise and diet.  Hypertensive end-organ damage includes CVA.      Review of Systems  Constitutional: Negative.  Negative for malaise/fatigue.  HENT: Negative.  Negative for neck pain.   Eyes: Negative.  Negative for blurred vision.  Respiratory: Negative.  Negative for shortness of breath.   Cardiovascular: Negative.  Negative for chest pain, palpitations, orthopnea and PND.  Gastrointestinal: Negative.   Genitourinary: Negative.   Musculoskeletal: Negative.   Skin: Negative.   Neurological: Negative for dizziness, tremors, seizures, syncope, facial asymmetry, speech difficulty, light-headedness, numbness and headaches.  Hematological: Negative.   Psychiatric/Behavioral: Negative.        Objective:   Physical Exam  Vitals reviewed. Constitutional: He is oriented to person, place, and time. He appears well-developed and well-nourished. No distress.  HENT:  Head: Normocephalic and atraumatic.  Mouth/Throat: Oropharynx is clear and moist. No oropharyngeal exudate.  Eyes: Conjunctivae are normal. Right eye exhibits no discharge. Left eye exhibits no discharge. No scleral icterus.  Neck: Normal range of motion. Neck supple. No JVD present. No tracheal deviation present. No thyromegaly present.  Cardiovascular: Normal rate, regular rhythm, normal heart  sounds and intact distal pulses.  Exam reveals no gallop and no friction rub.   No murmur heard. Pulmonary/Chest: Effort normal and breath sounds normal. No stridor. No respiratory distress. He has no wheezes. He has no rales. He exhibits no tenderness.  Abdominal: Soft. Bowel sounds are normal. He exhibits no distension and no mass. There is no tenderness. There is no rebound and no guarding.  Musculoskeletal: Normal range of motion. He exhibits no edema.  Lymphadenopathy:    He has no cervical adenopathy.  Neurological: He is oriented to person, place, and time.  Skin: Skin is warm and dry. No rash noted. He is not diaphoretic. No erythema. No pallor.  Psychiatric: He has a normal mood and affect. His behavior is normal. Judgment and thought content normal.          Lab Results  Component Value Date   WBC 6.1 06/10/2010   HGB 15.1 06/10/2010   HCT 43.7 06/10/2010   PLT 225.0 06/10/2010   GLUCOSE 91 06/10/2010   CHOL 144 06/10/2010   TRIG 63.0 06/10/2010   HDL 77.50 06/10/2010   LDLDIRECT 192.8 04/27/2010   LDLCALC 54 06/10/2010   ALT 20 06/10/2010   AST 25 06/10/2010   NA 140 06/10/2010   K 3.6 06/10/2010   CL 101 06/10/2010   CREATININE 0.9 06/10/2010   BUN 12 06/10/2010   CO2 29 06/10/2010   TSH 2.37 06/10/2010   INR 0.95 04/10/2010   Assessment & Plan:

## 2011-05-03 NOTE — Assessment & Plan Note (Signed)
His BP is well controlled 

## 2011-05-04 ENCOUNTER — Other Ambulatory Visit: Payer: Self-pay | Admitting: Internal Medicine

## 2011-05-19 ENCOUNTER — Other Ambulatory Visit: Payer: Self-pay | Admitting: Internal Medicine

## 2011-08-23 DIAGNOSIS — I619 Nontraumatic intracerebral hemorrhage, unspecified: Secondary | ICD-10-CM | POA: Diagnosis not present

## 2011-08-23 DIAGNOSIS — I10 Essential (primary) hypertension: Secondary | ICD-10-CM | POA: Diagnosis not present

## 2011-09-18 ENCOUNTER — Other Ambulatory Visit: Payer: Self-pay | Admitting: Internal Medicine

## 2011-09-20 ENCOUNTER — Encounter: Payer: Self-pay | Admitting: Internal Medicine

## 2011-09-20 ENCOUNTER — Ambulatory Visit (INDEPENDENT_AMBULATORY_CARE_PROVIDER_SITE_OTHER): Payer: Medicare Other | Admitting: Internal Medicine

## 2011-09-20 VITALS — BP 142/84 | HR 74 | Temp 97.4°F | Resp 16 | Wt 198.8 lb

## 2011-09-20 DIAGNOSIS — E785 Hyperlipidemia, unspecified: Secondary | ICD-10-CM | POA: Diagnosis not present

## 2011-09-20 DIAGNOSIS — I10 Essential (primary) hypertension: Secondary | ICD-10-CM | POA: Diagnosis not present

## 2011-09-20 MED ORDER — ALISKIREN FUMARATE 150 MG PO TABS: 150.0000 mg | ORAL_TABLET | Freq: Every day | ORAL | Status: DC

## 2011-09-20 NOTE — Progress Notes (Signed)
Subjective:    Patient ID: Lee Blevins, male    DOB: 05/17/1945, 66 y.o.   MRN: [id number]  Hyperlipidemia This is a chronic problem. The current episode started more than 1 year ago. The problem is controlled. Recent lipid tests were reviewed and are variable. Exacerbating diseases include obesity. He has no history of chronic renal disease, diabetes, hypothyroidism, liver disease or nephrotic syndrome. Factors aggravating his hyperlipidemia include thiazides. Associated symptoms include myalgias. Pertinent negatives include no chest pain, focal sensory loss, focal weakness, leg pain or shortness of breath. Current antihyperlipidemic treatment includes statins. The current treatment provides significant improvement of lipids. Compliance problems include adherence to exercise and adherence to diet.   Hypertension This is a chronic problem. The current episode started more than 1 year ago. The problem is unchanged. The problem is uncontrolled. Associated symptoms include peripheral edema (ankle swelling). Pertinent negatives include no anxiety, blurred vision, chest pain, headaches, malaise/fatigue, neck pain, orthopnea, palpitations, PND, shortness of breath or sweats. Past treatments include diuretics and calcium channel blockers. Compliance problems include medication side effects (ankle swelling).  Hypertensive end-organ damage includes CVA. There is no history of chronic renal disease.      Review of Systems  Constitutional: Negative for fever, chills, malaise/fatigue, diaphoresis, activity change, appetite change, fatigue and unexpected weight change.  HENT: Negative.  Negative for neck pain.   Eyes: Negative.  Negative for blurred vision.  Respiratory: Negative for apnea, cough, choking, chest tightness, shortness of breath, wheezing and stridor.   Cardiovascular: Positive for leg swelling. Negative for chest pain, palpitations, orthopnea and PND.  Gastrointestinal: Negative for nausea,  vomiting, abdominal pain, diarrhea, constipation, blood in stool, abdominal distention and anal bleeding.  Genitourinary: Negative.   Musculoskeletal: Positive for myalgias. Negative for back pain, joint swelling, arthralgias and gait problem.  Skin: Negative for color change, pallor, rash and wound.  Neurological: Negative for dizziness, tremors, focal weakness, seizures, syncope, facial asymmetry, speech difficulty, weakness, light-headedness, numbness and headaches.  Hematological: Negative for adenopathy. Does not bruise/bleed easily.  Psychiatric/Behavioral: Negative.        Objective:   Physical Exam  Vitals reviewed. Constitutional: He is oriented to person, place, and time. He appears well-developed and well-nourished. No distress.  HENT:  Head: Normocephalic and atraumatic.  Mouth/Throat: Oropharynx is clear and moist. No oropharyngeal exudate.  Eyes: Conjunctivae are normal. Right eye exhibits no discharge. Left eye exhibits no discharge. No scleral icterus.  Neck: Normal range of motion. Neck supple. No JVD present. No tracheal deviation present. No thyromegaly present.  Cardiovascular: Normal rate, regular rhythm, normal heart sounds and intact distal pulses.  Exam reveals no gallop and no friction rub.   No murmur heard. Pulmonary/Chest: Effort normal and breath sounds normal. No stridor. No respiratory distress. He has no wheezes. He has no rales. He exhibits no tenderness.  Abdominal: Soft. Bowel sounds are normal. He exhibits no distension and no mass. There is no tenderness. There is no rebound and no guarding.  Musculoskeletal: He exhibits edema (1+ pitting edema over B ankles). He exhibits no tenderness.  Lymphadenopathy:    He has no cervical adenopathy.  Neurological: He is oriented to person, place, and time.  Skin: Skin is warm and dry. No rash noted. He is not diaphoretic. No erythema. No pallor.  Psychiatric: He has a normal mood and affect. His behavior is  normal. Judgment and thought content normal.      Lab Results  Component Value Date   WBC 6.1 06/10/2010  HGB 15.1 06/10/2010   HCT 43.7 06/10/2010   PLT 225.0 06/10/2010   GLUCOSE 91 06/10/2010   CHOL 144 06/10/2010   TRIG 63.0 06/10/2010   HDL 77.50 06/10/2010   LDLDIRECT 192.8 04/27/2010   LDLCALC 54 06/10/2010   ALT 20 06/10/2010   AST 25 06/10/2010   NA 140 06/10/2010   K 3.6 06/10/2010   CL 101 06/10/2010   CREATININE 0.9 06/10/2010   BUN 12 06/10/2010   CO2 29 06/10/2010   TSH 2.37 06/10/2010   INR 0.95 04/10/2010      Assessment & Plan:

## 2011-09-20 NOTE — Assessment & Plan Note (Signed)
He has muscle aches so I have asked him to stop the crestor and will re-evaluate this later

## 2011-09-20 NOTE — Patient Instructions (Signed)

## 2011-09-20 NOTE — Assessment & Plan Note (Signed)
He will stop the CCB due to the ankle swelling, his BP is not well controlled so I have added Takturna to the HCTZ

## 2011-09-27 ENCOUNTER — Telehealth: Payer: Self-pay | Admitting: Internal Medicine

## 2011-09-27 MED ORDER — MONTELUKAST SODIUM 10 MG PO TABS: 10.0000 mg | ORAL_TABLET | Freq: Every day | ORAL | Status: DC

## 2011-09-27 NOTE — Telephone Encounter (Signed)
Needs refill on singulair, please send rx to [facility] on [address] [place]

## 2011-09-29 ENCOUNTER — Telehealth: Payer: Self-pay | Admitting: Internal Medicine

## 2011-09-29 NOTE — Telephone Encounter (Signed)
Caller: Margaret/Spouse; PCP: Sanda Linger; CB#: [phone]; Call regarding Dizziness; Dizziness onset 09/28/11. BP 138/77. Ambulatory. Dizziness was worse on 09/28/11, is worse when moving his head up and down. See in 24 hrs per Dizziness Protocol. Call back parameters reviewed. No appts today. Claris Che will call back for an appt after 1300 today.

## 2011-09-30 ENCOUNTER — Ambulatory Visit (INDEPENDENT_AMBULATORY_CARE_PROVIDER_SITE_OTHER): Payer: Medicare Other | Admitting: Family Medicine

## 2011-09-30 ENCOUNTER — Encounter: Payer: Self-pay | Admitting: Family Medicine

## 2011-09-30 VITALS — BP 154/78 | HR 59 | Temp 97.5°F | Wt 193.0 lb

## 2011-09-30 DIAGNOSIS — R42 Dizziness and giddiness: Secondary | ICD-10-CM

## 2011-09-30 NOTE — Assessment & Plan Note (Signed)
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  Would use OTC motion sickness meds and f/u prn.  Benign exam.

## 2011-09-30 NOTE — Patient Instructions (Addendum)
I would get some dramamine and get some rest.  This should gradually get better.  If if continues, then notify Dr. Yetta Barre.

## 2011-09-30 NOTE — Progress Notes (Signed)
Recently with BP med change.  Over last 3 days had dizzy sensation.  The room would spin.  He wasn't presyncopal.  No syncope, LOC, focal neuro change.  Vertigo with position change and head turning.  He was gradually getting better but then had another episode this AM with head turning.  Quick onset vertigo.  Lasts a short period of time.  No FCNAVD.     Meds, vitals, and allergies reviewed.   ROS: See HPI.  Otherwise, noncontributory.  GEN: nad, alert and oriented NECK: supple w/o LA CV: rrr.  PULM: ctab, no inc wob ABD: soft, +bs EXT: no edema SKIN: no acute rash CN 2-12 wnl B, S/S/DTR wnl  DHP positive Does not have orthostatic sx on exam.

## 2011-10-06 ENCOUNTER — Ambulatory Visit (INDEPENDENT_AMBULATORY_CARE_PROVIDER_SITE_OTHER): Payer: Medicare Other | Admitting: Internal Medicine

## 2011-10-06 ENCOUNTER — Encounter: Payer: Self-pay | Admitting: Internal Medicine

## 2011-10-06 VITALS — BP 138/76 | HR 68 | Temp 98.0°F | Resp 16 | Wt 192.0 lb

## 2011-10-06 DIAGNOSIS — E785 Hyperlipidemia, unspecified: Secondary | ICD-10-CM

## 2011-10-06 DIAGNOSIS — R42 Dizziness and giddiness: Secondary | ICD-10-CM

## 2011-10-06 DIAGNOSIS — Z8679 Personal history of other diseases of the circulatory system: Secondary | ICD-10-CM

## 2011-10-06 DIAGNOSIS — I10 Essential (primary) hypertension: Secondary | ICD-10-CM | POA: Diagnosis not present

## 2011-10-06 MED ORDER — MECLIZINE HCL 50 MG PO TABS: 50.0000 mg | ORAL_TABLET | Freq: Three times a day (TID) | ORAL | Status: AC | PRN

## 2011-10-06 MED ORDER — PITAVASTATIN CALCIUM 2 MG PO TABS: 1.0000 | ORAL_TABLET | Freq: Every day | ORAL | Status: DC

## 2011-10-06 NOTE — Assessment & Plan Note (Signed)
He needs CV risk reduction so I have asked him to start Livalo

## 2011-10-06 NOTE — Patient Instructions (Signed)
Hypertension As your heart beats, it forces blood through your arteries. This force is your blood pressure. If the pressure is too high, it is called hypertension (HTN) or high blood pressure. HTN is dangerous because you may have it and not know it. High blood pressure may mean that your heart has to work harder to pump blood. Your arteries may be narrow or stiff. The extra work puts you at risk for heart disease, stroke, and other problems.  Blood pressure consists of two numbers, a higher number over a lower, 110/72, for example. It is stated as "110 over 72." The ideal is below 120 for the top number (systolic) and under 80 for the bottom (diastolic). Write down your blood pressure today. You should pay close attention to your blood pressure if you have certain conditions such as:  Heart failure.   Prior heart attack.   Diabetes   Chronic kidney disease.   Prior stroke.   Multiple risk factors for heart disease.  To see if you have HTN, your blood pressure should be measured while you are seated with your arm held at the level of the heart. It should be measured at least twice. A one-time elevated blood pressure reading (especially in the Emergency Department) does not mean that you need treatment. There may be conditions in which the blood pressure is different between your right and left arms. It is important to see your caregiver soon for a recheck. Most people have essential hypertension which means that there is not a specific cause. This type of high blood pressure may be lowered by changing lifestyle factors such as:  Stress.   Smoking.   Lack of exercise.   Excessive weight.   Drug/tobacco/alcohol use.   Eating less salt.  Most people do not have symptoms from high blood pressure until it has caused damage to the body. Effective treatment can often prevent, delay or reduce that damage. TREATMENT  When a cause has been identified, treatment for high blood pressure is  directed at the cause. There are a large number of medications to treat HTN. These fall into several categories, and your caregiver will help you select the medicines that are best for you. Medications may have side effects. You should review side effects with your caregiver. If your blood pressure stays high after you have made lifestyle changes or started on medicines,   Your medication(s) may need to be changed.   Other problems may need to be addressed.   Be certain you understand your prescriptions, and know how and when to take your medicine.   Be sure to follow up with your caregiver within the time frame advised (usually within two weeks) to have your blood pressure rechecked and to review your medications.   If you are taking more than one medicine to lower your blood pressure, make sure you know how and at what times they should be taken. Taking two medicines at the same time can result in blood pressure that is too low.  SEEK IMMEDIATE MEDICAL CARE IF:  You develop a severe headache, blurred or changing vision, or confusion.   You have unusual weakness or numbness, or a faint feeling.   You have severe chest or abdominal pain, vomiting, or breathing problems.  MAKE SURE YOU:   Understand these instructions.   Will watch your condition.   Will get help right away if you are not doing well or get worse.  Document Released: 04/03/2005 Document Revised: 03/23/2011 Document Reviewed:   11/22/2007 [employer] Patient Information 2012 [place], [place].Vertigo Vertigo means you feel like you or your surroundings are moving when they are not. Vertigo can be dangerous if it occurs when you are at work, driving, or performing difficult activities.  CAUSES  Vertigo occurs when there is a conflict of signals sent to your brain from the visual and sensory systems in your body. There are many different causes of vertigo, including:  Infections, especially in the inner ear.   A bad reaction to  a drug or misuse of alcohol and medicines.   Withdrawal from drugs or alcohol.   Rapidly changing positions, such as lying down or rolling over in bed.   A migraine headache.   Decreased blood flow to the brain.   Increased pressure in the brain from a head injury, infection, tumor, or bleeding.  SYMPTOMS  You may feel as though the world is spinning around or you are falling to the ground. Because your balance is upset, vertigo can cause nausea and vomiting. You may have involuntary eye movements (nystagmus). DIAGNOSIS  Vertigo is usually diagnosed by physical exam. If the cause of your vertigo is unknown, your caregiver may perform imaging tests, such as an MRI scan (magnetic resonance imaging). TREATMENT  Most cases of vertigo resolve on their own, without treatment. Depending on the cause, your caregiver may prescribe certain medicines. If your vertigo is related to body position issues, your caregiver may recommend movements or procedures to correct the problem. In rare cases, if your vertigo is caused by certain inner ear problems, you may need surgery. HOME CARE INSTRUCTIONS   Follow your caregiver's instructions.   Avoid driving.   Avoid operating heavy machinery.   Avoid performing any tasks that would be dangerous to you or others during a vertigo episode.   Tell your caregiver if you notice that certain medicines seem to be causing your vertigo. Some of the medicines used to treat vertigo episodes can actually make them worse in some people.  SEEK IMMEDIATE MEDICAL CARE IF:   Your medicines do not relieve your vertigo or are making it worse.   You develop problems with talking, walking, weakness, or using your arms, hands, or legs.   You develop severe headaches.   Your nausea or vomiting continues or gets worse.   You develop visual changes.   A family member notices behavioral changes.   Your condition gets worse.  MAKE SURE YOU:  Understand these  instructions.   Will watch your condition.   Will get help right away if you are not doing well or get worse.  Document Released: 01/11/2005 Document Revised: 03/23/2011 Document Reviewed: 10/20/2010 [facility] Patient Information 2012 [place], [place].

## 2011-10-06 NOTE — Progress Notes (Signed)
Subjective:    Patient ID: Lee Blevins, male    DOB: 1945-08-21, 66 y.o.   MRN: [id number]  HPI He returns for f/up on dizziness and vertigo, he had a flare about one week ago and was seen by Dr. Para March and treated for BPPV. The symptoms have improved some in the last week. He has had intermittent dizziness for about 1.5 years. He saw Dr. Pearlean Brownie about 2 months ago and the dizziness was not discussed during that visit. There is a positional component to the dizziness, it worsens when he lays down. There is no correlation with the timing of his BP meds. There is no orthostatic component. He has stopped 2 BP meds previously due to dizziness and there has been no change. He has chronic allergy symptoms that are no worse recently than usual.  Review of Systems  Constitutional: Negative for fever, chills, diaphoresis, activity change, appetite change, fatigue and unexpected weight change.  HENT: Negative.   Eyes: Negative.   Respiratory: Negative for cough, chest tightness, shortness of breath, wheezing and stridor.   Cardiovascular: Negative for chest pain and palpitations.  Gastrointestinal: Negative.   Musculoskeletal: Negative.   Skin: Negative for color change, pallor, rash and wound.  Neurological: Positive for dizziness (and vertigo). Negative for tremors, seizures, syncope, facial asymmetry, speech difficulty, weakness, light-headedness, numbness and headaches.  Hematological: Negative for adenopathy. Does not bruise/bleed easily.  Psychiatric/Behavioral: Negative.        Objective:   Physical Exam  Vitals reviewed. Constitutional: He is oriented to person, place, and time. He appears well-developed and well-nourished. No distress.  HENT:  Head: Normocephalic and atraumatic.  Mouth/Throat: Oropharynx is clear and moist. No oropharyngeal exudate.  Eyes: Conjunctivae are normal. Right eye exhibits no discharge. Left eye exhibits no discharge. No scleral icterus.  Neck: Normal range of  motion. Neck supple. No JVD present. No tracheal deviation present. No thyromegaly present.  Cardiovascular: Normal rate, regular rhythm, normal heart sounds and intact distal pulses.  Exam reveals no gallop and no friction rub.   No murmur heard. Pulmonary/Chest: Effort normal and breath sounds normal. No stridor. No respiratory distress. He has no wheezes. He has no rales. He exhibits no tenderness.  Abdominal: Soft. Bowel sounds are normal. He exhibits no distension and no mass. There is no tenderness. There is no rebound and no guarding.  Musculoskeletal: Normal range of motion. He exhibits no edema and no tenderness.  Lymphadenopathy:    He has no cervical adenopathy.  Neurological: He is alert and oriented to person, place, and time. He has normal strength. He displays no atrophy, no tremor and normal reflexes. No cranial nerve deficit or sensory deficit. He exhibits normal muscle tone. He displays a negative Romberg sign. He displays no seizure activity. Coordination and gait normal. He displays no Babinski's sign on the right side. He displays no Babinski's sign on the left side.  Reflex Scores:      Tricep reflexes are 1+ on the right side and 1+ on the left side.      Bicep reflexes are 1+ on the right side.      Brachioradialis reflexes are 1+ on the right side and 1+ on the left side.      Patellar reflexes are 1+ on the right side and 1+ on the left side.      Achilles reflexes are 1+ on the right side and 1+ on the left side. Skin: Skin is warm and dry. No rash noted. He is not  diaphoretic. No erythema. No pallor.  Psychiatric: He has a normal mood and affect. His behavior is normal. Judgment and thought content normal.     Lab Results  Component Value Date   WBC 6.1 06/10/2010   HGB 15.1 06/10/2010   HCT 43.7 06/10/2010   PLT 225.0 06/10/2010   GLUCOSE 91 06/10/2010   CHOL 144 06/10/2010   TRIG 63.0 06/10/2010   HDL 77.50 06/10/2010   LDLDIRECT 192.8 04/27/2010   LDLCALC 54  06/10/2010   ALT 20 06/10/2010   AST 25 06/10/2010   NA 140 06/10/2010   K 3.6 06/10/2010   CL 101 06/10/2010   CREATININE 0.9 06/10/2010   BUN 12 06/10/2010   CO2 29 06/10/2010   TSH 2.37 06/10/2010   INR 0.95 04/10/2010       Assessment & Plan:

## 2011-10-06 NOTE — Assessment & Plan Note (Signed)
His BP is well controlled, I do not think tekturna is causing the dizziness

## 2011-10-06 NOTE — Assessment & Plan Note (Addendum)
He has s/s c/w labrynthitis and BPPV . He will try meclizine for symptom relief, I have asked him to discuss the dizziness with neurology to see if the CVA is the cause or are there other factors contributing to this.

## 2011-10-11 DIAGNOSIS — R42 Dizziness and giddiness: Secondary | ICD-10-CM | POA: Diagnosis not present

## 2011-10-17 ENCOUNTER — Other Ambulatory Visit: Payer: Self-pay | Admitting: Neurology

## 2011-10-17 DIAGNOSIS — R42 Dizziness and giddiness: Secondary | ICD-10-CM

## 2011-10-23 ENCOUNTER — Ambulatory Visit: Payer: Medicare Other | Attending: Neurology | Admitting: Physical Therapy

## 2011-10-23 DIAGNOSIS — R42 Dizziness and giddiness: Secondary | ICD-10-CM | POA: Insufficient documentation

## 2011-10-23 DIAGNOSIS — R269 Unspecified abnormalities of gait and mobility: Secondary | ICD-10-CM | POA: Insufficient documentation

## 2011-10-23 DIAGNOSIS — IMO0001 Reserved for inherently not codable concepts without codable children: Secondary | ICD-10-CM | POA: Diagnosis not present

## 2011-10-26 ENCOUNTER — Other Ambulatory Visit: Payer: Self-pay | Admitting: Internal Medicine

## 2011-10-30 ENCOUNTER — Ambulatory Visit
Admission: RE | Admit: 2011-10-30 | Discharge: 2011-10-30 | Disposition: A | Discharge status: Home / Self Care | Payer: Medicare Other | Source: Ambulatory Visit | Attending: Neurology | Admitting: Neurology

## 2011-10-30 DIAGNOSIS — R42 Dizziness and giddiness: Secondary | ICD-10-CM

## 2011-10-30 DIAGNOSIS — I619 Nontraumatic intracerebral hemorrhage, unspecified: Secondary | ICD-10-CM | POA: Diagnosis not present

## 2011-10-30 MED ORDER — GADOBENATE DIMEGLUMINE 529 MG/ML IV SOLN
18.0000 mL | Freq: Once | INTRAVENOUS | Status: AC | PRN
  Administered 2011-10-30: 18 mL via INTRAVENOUS

## 2011-11-01 ENCOUNTER — Ambulatory Visit (INDEPENDENT_AMBULATORY_CARE_PROVIDER_SITE_OTHER): Payer: Medicare Other | Admitting: Internal Medicine

## 2011-11-01 ENCOUNTER — Encounter: Payer: Self-pay | Admitting: Internal Medicine

## 2011-11-01 VITALS — BP 138/88 | HR 80 | Temp 97.0°F | Resp 16 | Wt 192.0 lb

## 2011-11-01 DIAGNOSIS — I10 Essential (primary) hypertension: Secondary | ICD-10-CM | POA: Diagnosis not present

## 2011-11-01 DIAGNOSIS — R42 Dizziness and giddiness: Secondary | ICD-10-CM | POA: Diagnosis not present

## 2011-11-01 DIAGNOSIS — J45909 Unspecified asthma, uncomplicated: Secondary | ICD-10-CM

## 2011-11-01 DIAGNOSIS — J209 Acute bronchitis, unspecified: Secondary | ICD-10-CM | POA: Insufficient documentation

## 2011-11-01 DIAGNOSIS — E785 Hyperlipidemia, unspecified: Secondary | ICD-10-CM

## 2011-11-01 MED ORDER — CEFUROXIME AXETIL 500 MG PO TABS: 500.0000 mg | ORAL_TABLET | Freq: Two times a day (BID) | ORAL | Status: AC

## 2011-11-01 NOTE — Assessment & Plan Note (Signed)
He will see Dr. Maple Hudson soon about this

## 2011-11-01 NOTE — Patient Instructions (Signed)

## 2011-11-01 NOTE — Assessment & Plan Note (Signed)
He has stopped livalo due to muscle aches

## 2011-11-01 NOTE — Assessment & Plan Note (Signed)
He tells me that this has resolved

## 2011-11-01 NOTE — Assessment & Plan Note (Signed)
His BP is well controlled 

## 2011-11-01 NOTE — Progress Notes (Signed)
Subjective:    Patient ID: Lee Blevins, male    DOB: 21-Oct-1945, 66 y.o.   MRN: [id number]  Cough This is a recurrent problem. The current episode started 1 to 4 weeks ago. The problem has been gradually worsening. The problem occurs every few hours. The cough is productive of purulent sputum. Associated symptoms include myalgias, nasal congestion and wheezing. Pertinent negatives include no chest pain, chills, ear congestion, ear pain, fever, headaches, heartburn, hemoptysis, postnasal drip, rash, rhinorrhea, sore throat, shortness of breath, sweats or weight loss. He has tried steroid inhaler and a beta-agonist inhaler for the symptoms. The treatment provided moderate relief. His past medical history is significant for asthma.  Hyperlipidemia This is a chronic problem. The current episode started more than 1 year ago. The problem is controlled. Recent lipid tests were reviewed and are variable. He has no history of chronic renal disease, diabetes, hypothyroidism, liver disease, obesity or nephrotic syndrome. Associated symptoms include myalgias. Pertinent negatives include no chest pain, focal sensory loss, focal weakness, leg pain or shortness of breath. Current antihyperlipidemic treatment includes statins. Compliance problems include medication side effects.       Review of Systems  Constitutional: Negative for fever, chills, weight loss, diaphoresis, activity change, appetite change, fatigue and unexpected weight change.  HENT: Negative.  Negative for ear pain, sore throat, rhinorrhea and postnasal drip.   Eyes: Negative.   Respiratory: Positive for cough and wheezing. Negative for apnea, hemoptysis, choking, chest tightness, shortness of breath and stridor.   Cardiovascular: Negative for chest pain, palpitations and leg swelling.  Gastrointestinal: Negative for heartburn, nausea, vomiting, abdominal pain, diarrhea, constipation and blood in stool.  Genitourinary: Negative.     Musculoskeletal: Positive for myalgias. Negative for back pain, joint swelling, arthralgias and gait problem.  Skin: Negative for color change, pallor, rash and wound.  Neurological: Negative for dizziness, tremors, focal weakness, seizures, syncope, facial asymmetry, speech difficulty, weakness, light-headedness, numbness and headaches.  Hematological: Negative for adenopathy. Does not bruise/bleed easily.  Psychiatric/Behavioral: Negative.        Objective:   Physical Exam  Vitals reviewed. Constitutional: He is oriented to person, place, and time. He appears well-developed and well-nourished.  Non-toxic appearance. He does not have a sickly appearance. He does not appear ill. No distress.  HENT:  Head: Normocephalic and atraumatic.  Mouth/Throat: Oropharynx is clear and moist. No oropharyngeal exudate.  Eyes: Conjunctivae are normal. Right eye exhibits no discharge. Left eye exhibits no discharge. No scleral icterus.  Neck: Normal range of motion. Neck supple. No JVD present. No tracheal deviation present. No thyromegaly present.  Cardiovascular: Normal rate, regular rhythm, normal heart sounds and intact distal pulses.  Exam reveals no gallop and no friction rub.   No murmur heard. Pulmonary/Chest: Effort normal. No accessory muscle usage or stridor. Not tachypneic. No respiratory distress. He has no decreased breath sounds. He has wheezes in the right lower field and the left lower field. He has no rhonchi. He has no rales. He exhibits no tenderness.  Abdominal: Soft. Bowel sounds are normal. He exhibits no distension and no mass. There is no tenderness. There is no rebound and no guarding.  Musculoskeletal: Normal range of motion. He exhibits no edema and no tenderness.  Lymphadenopathy:    He has no cervical adenopathy.  Neurological: He is oriented to person, place, and time.  Skin: Skin is warm and dry. No rash noted. He is not diaphoretic. No erythema. No pallor.  Psychiatric:  He has a normal mood  and affect. His behavior is normal. Judgment and thought content normal.      Lab Results  Component Value Date   WBC 6.1 06/10/2010   HGB 15.1 06/10/2010   HCT 43.7 06/10/2010   PLT 225.0 06/10/2010   GLUCOSE 91 06/10/2010   CHOL 144 06/10/2010   TRIG 63.0 06/10/2010   HDL 77.50 06/10/2010   LDLDIRECT 192.8 04/27/2010   LDLCALC 54 06/10/2010   ALT 20 06/10/2010   AST 25 06/10/2010   NA 140 06/10/2010   K 3.6 06/10/2010   CL 101 06/10/2010   CREATININE 0.9 06/10/2010   BUN 12 06/10/2010   CO2 29 06/10/2010   TSH 2.37 06/10/2010   INR 0.95 04/10/2010      Assessment & Plan:

## 2011-11-01 NOTE — Assessment & Plan Note (Signed)
Will treat the infection with ceftin 

## 2011-11-02 ENCOUNTER — Ambulatory Visit: Payer: Medicare Other | Admitting: Physical Therapy

## 2011-11-02 DIAGNOSIS — IMO0001 Reserved for inherently not codable concepts without codable children: Secondary | ICD-10-CM | POA: Diagnosis not present

## 2011-11-02 DIAGNOSIS — R42 Dizziness and giddiness: Secondary | ICD-10-CM | POA: Diagnosis not present

## 2011-11-02 DIAGNOSIS — R269 Unspecified abnormalities of gait and mobility: Secondary | ICD-10-CM | POA: Diagnosis not present

## 2011-11-03 ENCOUNTER — Ambulatory Visit: Payer: Medicare Other | Admitting: Internal Medicine

## 2011-11-06 ENCOUNTER — Encounter: Payer: Self-pay | Admitting: Internal Medicine

## 2011-11-06 ENCOUNTER — Ambulatory Visit (INDEPENDENT_AMBULATORY_CARE_PROVIDER_SITE_OTHER): Payer: Medicare Other | Admitting: Internal Medicine

## 2011-11-06 VITALS — BP 140/82 | HR 69 | Ht 71.0 in | Wt 193.6 lb

## 2011-11-06 DIAGNOSIS — J45909 Unspecified asthma, uncomplicated: Secondary | ICD-10-CM | POA: Diagnosis not present

## 2011-11-06 DIAGNOSIS — J328 Other chronic sinusitis: Secondary | ICD-10-CM | POA: Diagnosis not present

## 2011-11-06 DIAGNOSIS — J330 Polyp of nasal cavity: Secondary | ICD-10-CM

## 2011-11-06 NOTE — Patient Instructions (Addendum)
Try otc decongestant phenylephrine  - one brand would be Sudafed-PE  Try  otc anti-inflammatory nasal spray Nasalcrom/ cromolyn  Please call as needed

## 2011-11-06 NOTE — Progress Notes (Signed)
Subjective:    Patient ID: Lee Blevins, male    DOB: 12-15-1945, 66 y.o.   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  Has not had significant changes in breathing or nasal symptoms. He is walking 6 miles/ week and tolerating that well. He remains dependent on prednisone 5 mg daily, Advair 1 puff daily, and singulair, nasonex. Coughs up clear mucus at times. No wheeze. Doesn't have a rescue inhaler and doesn't think he needs one- never wheezes now.  11/06/11-65 yoM former smoker followed for asthma, chronic sinusitis, nasal polyps complicated by hx CVA, HBP Pt states having more problems with sinuses .states having some wheezing ,chest congestion . Has had decreased sense of smell for 6 weeks. His previous ENT doctor has retired. Persistent morning cough productive of thick sputum which clears each day. Denies shortness of breath and walks regularly. No headache or fever. Off of prednisone maintenance now for 5 months but continues Nasonex and Singulair with Advair 1 puff twice daily.  ROS-see HPI Constitutional:   No-   weight loss, night sweats, fevers, chills, fatigue, lassitude. HEENT:   No-  headaches, difficulty swallowing, tooth/dental problems, sore throat,       No-  sneezing, itching, ear ache, +nasal congestion, post nasal drip,  CV:  No-   chest pain, orthopnea, PND, swelling in lower extremities, anasarca, dizziness, palpitations Resp: No-   shortness of breath with exertion or at rest.             +   productive cough,  No non-productive cough,  No- coughing up of blood.              No-   change in color of mucus.  No- wheezing.   Skin: No-   rash or lesions. GI:  No-   heartburn, indigestion, abdominal pain, nausea, vomiting,  GU:  MS:  No-   joint pain or swelling.   Neuro-     nothing  unusual Psych:  No- change in mood or affect. No depression or anxiety.  No memory loss.  Objective:   Physical Exam General- Alert, Oriented, Affect-appropriate, Distress- none acute, trim Skin- rash-none, lesions- none, excoriation- none Lymphadenopathy- none Head- atraumatic            Eyes- Gross vision intact, PERRLA, conjunctivae clear secretions            Ears- Hearing, canals normal            Nose- Clear, No- Septal dev, mucus, polyps, erosion, perforation             Throat- Mallampati II-III , mucosa clear , drainage- none, tonsils- atrophic Neck- flexible , trachea midline, no stridor , thyroid nl, carotid no bruit Chest - symmetrical excursion , unlabored           Heart/CV- RRR , no murmur , no gallop  , no rub, nl s1 s2                           - JVD- none , edema- none, stasis changes- none, varices- none           Lung- clear to P&A, wheeze- none, cough- none , dullness-none, rub- none           Chest wall-  Abd-  Br/ Gen/ Rectal- Not done, not indicated Extrem- cyanosis- none, clubbing, none, atrophy- none, strength- nl Neuro- grossly intact to observation  Assessment & Plan:

## 2011-11-08 ENCOUNTER — Encounter: Payer: Medicare Other | Admitting: Physical Therapy

## 2011-11-08 NOTE — Assessment & Plan Note (Signed)
No polyps visible on anterior examination today

## 2011-11-08 NOTE — Assessment & Plan Note (Addendum)
Increased rhinitis symptoms including anosmia. This has been his long-term pattern and only maintenance prednisone seems to have helped. He does continue nasal steroid. Plan-explore alternatives-Nasalcrom and decongestants

## 2011-11-08 NOTE — Assessment & Plan Note (Signed)
Not much wheeze but he describes morning cough. This may be a mild exacerbation parallel with his rhinitis symptoms.

## 2011-11-15 ENCOUNTER — Encounter: Payer: Medicare Other | Admitting: Physical Therapy

## 2011-11-15 ENCOUNTER — Telehealth: Payer: Self-pay | Admitting: Internal Medicine

## 2011-11-15 MED ORDER — ALISKIREN FUMARATE 150 MG PO TABS: 150.0000 mg | ORAL_TABLET | Freq: Every day | ORAL | Status: DC

## 2011-11-15 NOTE — Telephone Encounter (Signed)
The pt called and is hoping to get an rx sent in for aliskiren tekturna 150 mg tabs (spelling ?).  He states Dr.Jones gave him a sample during his last visit.   Thanks!

## 2011-11-21 ENCOUNTER — Other Ambulatory Visit: Payer: Self-pay | Admitting: Internal Medicine

## 2011-11-23 ENCOUNTER — Telehealth: Payer: Self-pay | Admitting: Internal Medicine

## 2011-11-23 NOTE — Telephone Encounter (Signed)
Made in error. Lee Blevins  °

## 2011-11-27 ENCOUNTER — Ambulatory Visit (INDEPENDENT_AMBULATORY_CARE_PROVIDER_SITE_OTHER): Payer: Medicare Other | Admitting: Internal Medicine

## 2011-11-27 ENCOUNTER — Encounter: Payer: Self-pay | Admitting: Internal Medicine

## 2011-11-27 VITALS — BP 140/82 | HR 74 | Temp 98.7°F | Resp 16 | Wt 192.8 lb

## 2011-11-27 DIAGNOSIS — R430 Anosmia: Secondary | ICD-10-CM | POA: Insufficient documentation

## 2011-11-27 DIAGNOSIS — R439 Unspecified disturbances of smell and taste: Secondary | ICD-10-CM

## 2011-11-27 DIAGNOSIS — J330 Polyp of nasal cavity: Secondary | ICD-10-CM | POA: Diagnosis not present

## 2011-11-27 DIAGNOSIS — I10 Essential (primary) hypertension: Secondary | ICD-10-CM | POA: Diagnosis not present

## 2011-11-27 DIAGNOSIS — J328 Other chronic sinusitis: Secondary | ICD-10-CM | POA: Diagnosis not present

## 2011-11-27 MED ORDER — OLMESARTAN MEDOXOMIL 40 MG PO TABS: 40.0000 mg | ORAL_TABLET | Freq: Every day | ORAL | Status: DC

## 2011-11-27 NOTE — Patient Instructions (Signed)

## 2011-11-27 NOTE — Assessment & Plan Note (Signed)
ENT referral

## 2011-11-27 NOTE — Assessment & Plan Note (Signed)
At his request the tekturna was stopped, I don't think his dizziness was ever cause by Benicar so I have removed that as an allergy and have asked him to restart Benicar

## 2011-11-27 NOTE — Progress Notes (Signed)
Subjective:    Patient ID: Lee Blevins, male    DOB: 1946-03-05, 66 y.o.   MRN: [id number]  Hypertension This is a chronic problem. The current episode started more than 1 year ago. The problem is unchanged. The problem is uncontrolled. Pertinent negatives include no anxiety, blurred vision, chest pain, headaches, malaise/fatigue, neck pain, orthopnea, palpitations, peripheral edema, PND, shortness of breath or sweats. Past treatments include diuretics and angiotensin blockers. Compliance problems include medication side effects, exercise and diet (he feels achy and wants to stop taking tekturna).  Hypertensive end-organ damage includes CVA.      Review of Systems  Constitutional: Negative for fever, chills, malaise/fatigue, diaphoresis, activity change, appetite change, fatigue and unexpected weight change.  HENT: Positive for congestion (and loss of sense of smell), rhinorrhea and postnasal drip. Negative for sore throat, sneezing, neck pain, dental problem, voice change and sinus pressure.   Eyes: Negative for blurred vision.  Respiratory: Negative for cough, chest tightness, shortness of breath, wheezing and stridor.   Cardiovascular: Negative for chest pain, palpitations, orthopnea, leg swelling and PND.  Gastrointestinal: Negative for nausea, vomiting, abdominal pain, diarrhea and blood in stool.  Genitourinary: Negative.   Musculoskeletal: Positive for myalgias. Negative for back pain, joint swelling, arthralgias and gait problem.  Skin: Negative.   Neurological: Negative for dizziness, tremors, seizures, syncope, facial asymmetry, speech difficulty, weakness, light-headedness, numbness and headaches.  Hematological: Negative for adenopathy. Does not bruise/bleed easily.  Psychiatric/Behavioral: Negative.        Objective:   Physical Exam  Vitals reviewed. Constitutional: He is oriented to person, place, and time. He appears well-developed and well-nourished. No distress.  HENT:   Head: Normocephalic and atraumatic.  Mouth/Throat: Oropharynx is clear and moist. No oropharyngeal exudate.  Eyes: Conjunctivae are normal. Right eye exhibits no discharge. Left eye exhibits no discharge. No scleral icterus.  Neck: Normal range of motion. Neck supple. No JVD present. No tracheal deviation present. No thyromegaly present.  Cardiovascular: Normal rate, regular rhythm, normal heart sounds and intact distal pulses.  Exam reveals no gallop and no friction rub.   No murmur heard. Pulmonary/Chest: Effort normal and breath sounds normal. No stridor. No respiratory distress. He has no wheezes. He has no rales. He exhibits no tenderness.  Abdominal: Soft. Bowel sounds are normal. He exhibits no distension and no mass. There is no tenderness. There is no rebound and no guarding.  Musculoskeletal: Normal range of motion. He exhibits no edema and no tenderness.  Lymphadenopathy:    He has no cervical adenopathy.  Neurological: He is oriented to person, place, and time.  Skin: Skin is warm and dry. No rash noted. He is not diaphoretic. No erythema. No pallor.  Psychiatric: He has a normal mood and affect. His behavior is normal. Judgment and thought content normal.     Lab Results  Component Value Date   WBC 6.1 06/10/2010   HGB 15.1 06/10/2010   HCT 43.7 06/10/2010   PLT 225.0 06/10/2010   GLUCOSE 91 06/10/2010   CHOL 144 06/10/2010   TRIG 63.0 06/10/2010   HDL 77.50 06/10/2010   LDLDIRECT 192.8 04/27/2010   LDLCALC 54 06/10/2010   ALT 20 06/10/2010   AST 25 06/10/2010   NA 140 06/10/2010   K 3.6 06/10/2010   CL 101 06/10/2010   CREATININE 0.9 06/10/2010   BUN 12 06/10/2010   CO2 29 06/10/2010   TSH 2.37 06/10/2010   INR 0.95 04/10/2010       Assessment & Plan:

## 2011-12-06 DIAGNOSIS — J328 Other chronic sinusitis: Secondary | ICD-10-CM | POA: Diagnosis not present

## 2011-12-06 DIAGNOSIS — R439 Unspecified disturbances of smell and taste: Secondary | ICD-10-CM | POA: Diagnosis not present

## 2011-12-19 ENCOUNTER — Other Ambulatory Visit: Payer: Self-pay | Admitting: Internal Medicine

## 2012-01-04 DIAGNOSIS — R439 Unspecified disturbances of smell and taste: Secondary | ICD-10-CM | POA: Diagnosis not present

## 2012-01-04 DIAGNOSIS — J328 Other chronic sinusitis: Secondary | ICD-10-CM | POA: Diagnosis not present

## 2012-01-31 ENCOUNTER — Encounter: Payer: Self-pay | Admitting: Internal Medicine

## 2012-01-31 ENCOUNTER — Ambulatory Visit (INDEPENDENT_AMBULATORY_CARE_PROVIDER_SITE_OTHER): Payer: Medicare Other | Admitting: Internal Medicine

## 2012-01-31 VITALS — BP 120/66 | HR 60 | Temp 98.6°F | Resp 16 | Wt 196.0 lb

## 2012-01-31 DIAGNOSIS — D239 Other benign neoplasm of skin, unspecified: Secondary | ICD-10-CM | POA: Diagnosis not present

## 2012-01-31 DIAGNOSIS — I10 Essential (primary) hypertension: Secondary | ICD-10-CM | POA: Diagnosis not present

## 2012-01-31 DIAGNOSIS — D229 Melanocytic nevi, unspecified: Secondary | ICD-10-CM

## 2012-01-31 DIAGNOSIS — Z23 Encounter for immunization: Secondary | ICD-10-CM | POA: Diagnosis not present

## 2012-01-31 MED ORDER — OLMESARTAN MEDOXOMIL 20 MG PO TABS: 20.0000 mg | ORAL_TABLET | Freq: Every day | ORAL | Status: DC

## 2012-01-31 NOTE — Patient Instructions (Signed)

## 2012-01-31 NOTE — Assessment & Plan Note (Signed)
I will decrease the Benicar to 20 mg at his request

## 2012-01-31 NOTE — Progress Notes (Signed)
  Subjective:    Patient ID: Lee Blevins, male    DOB: [birth year], 66 y.o.   MRN: [id number]  Hypertension This is a chronic problem. The current episode started more than 1 year ago. The problem has been gradually improving since onset. The problem is controlled. Pertinent negatives include no anxiety, blurred vision, chest pain, headaches, malaise/fatigue, neck pain, orthopnea, palpitations, peripheral edema, PND, shortness of breath or sweats. Past treatments include diuretics and angiotensin blockers. The current treatment provides moderate improvement. Compliance problems include exercise, diet and medication side effects (dizziness, thinks his BP is too low).  Hypertensive end-organ damage includes CVA.      Review of Systems  Constitutional: Negative.  Negative for malaise/fatigue.  HENT: Negative.  Negative for neck pain.   Eyes: Negative.  Negative for blurred vision.  Respiratory: Negative for cough, chest tightness, shortness of breath, wheezing and stridor.   Cardiovascular: Negative for chest pain, palpitations, orthopnea, leg swelling and PND.  Gastrointestinal: Negative.   Genitourinary: Negative.   Musculoskeletal: Negative.   Skin: Negative.   Neurological: Positive for dizziness and light-headedness. Negative for tremors, seizures, syncope, facial asymmetry, speech difficulty, weakness, numbness and headaches.  Hematological: Negative for adenopathy. Does not bruise/bleed easily.  Psychiatric/Behavioral: Negative.        Objective:   Physical Exam  Vitals reviewed. Constitutional: He is oriented to person, place, and time. He appears well-developed and well-nourished. No distress.  HENT:  Head: Normocephalic and atraumatic.  Mouth/Throat: Oropharynx is clear and moist. No oropharyngeal exudate.  Eyes: Conjunctivae normal are normal. Right eye exhibits no discharge. Left eye exhibits no discharge. No scleral icterus.  Neck: Normal range of motion. Neck supple. No JVD  present. No tracheal deviation present. No thyromegaly present.  Cardiovascular: Normal rate, regular rhythm, normal heart sounds and intact distal pulses.  Exam reveals no gallop and no friction rub.   No murmur heard. Pulmonary/Chest: Effort normal and breath sounds normal. No stridor. No respiratory distress. He has no wheezes. He has no rales. He exhibits no tenderness.  Abdominal: Soft. Bowel sounds are normal. He exhibits no distension and no mass. There is no tenderness. There is no rebound and no guarding.  Musculoskeletal: Normal range of motion. He exhibits no edema and no tenderness.  Lymphadenopathy:    He has no cervical adenopathy.  Neurological: He is oriented to person, place, and time.  Skin: Skin is warm and dry. No rash noted. He is not diaphoretic. No erythema. No pallor.       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 His behavior is normal. Judgment and thought content normal.     Lab Results  Component Value Date   WBC 6.1 06/10/2010   HGB 15.1 06/10/2010   HCT 43.7 06/10/2010   PLT 225.0 06/10/2010   GLUCOSE 91 06/10/2010   CHOL 144 06/10/2010   TRIG 63.0 06/10/2010   HDL 77.50 06/10/2010   LDLDIRECT 192.8 04/27/2010   LDLCALC 54 06/10/2010   ALT 20 06/10/2010   AST 25 06/10/2010   NA 140 06/10/2010   K 3.6 06/10/2010   CL 101 06/10/2010   CREATININE 0.9 06/10/2010   BUN 12 06/10/2010   CO2 29 06/10/2010   TSH 2.37 06/10/2010   INR 0.95 04/10/2010       Assessment & Plan:

## 2012-01-31 NOTE — Assessment & Plan Note (Signed)
Derm referral.

## 2012-02-20 DIAGNOSIS — D485 Neoplasm of uncertain behavior of skin: Secondary | ICD-10-CM | POA: Diagnosis not present

## 2012-02-20 DIAGNOSIS — L570 Actinic keratosis: Secondary | ICD-10-CM | POA: Diagnosis not present

## 2012-03-06 ENCOUNTER — Encounter: Payer: Self-pay | Admitting: Internal Medicine

## 2012-04-03 ENCOUNTER — Ambulatory Visit: Payer: Medicare Other | Admitting: Internal Medicine

## 2012-04-18 ENCOUNTER — Ambulatory Visit (INDEPENDENT_AMBULATORY_CARE_PROVIDER_SITE_OTHER): Payer: Medicare Other | Admitting: Internal Medicine

## 2012-04-18 ENCOUNTER — Other Ambulatory Visit (INDEPENDENT_AMBULATORY_CARE_PROVIDER_SITE_OTHER): Payer: Medicare Other

## 2012-04-18 ENCOUNTER — Encounter: Payer: Self-pay | Admitting: Internal Medicine

## 2012-04-18 VITALS — BP 128/72 | HR 70 | Temp 97.5°F | Resp 16 | Wt 203.0 lb

## 2012-04-18 DIAGNOSIS — E785 Hyperlipidemia, unspecified: Secondary | ICD-10-CM

## 2012-04-18 DIAGNOSIS — N401 Enlarged prostate with lower urinary tract symptoms: Secondary | ICD-10-CM | POA: Diagnosis not present

## 2012-04-18 DIAGNOSIS — I10 Essential (primary) hypertension: Secondary | ICD-10-CM | POA: Diagnosis not present

## 2012-04-18 DIAGNOSIS — Z0000 Encounter for general adult medical examination without abnormal findings: Secondary | ICD-10-CM | POA: Diagnosis not present

## 2012-04-18 DIAGNOSIS — R351 Nocturia: Secondary | ICD-10-CM

## 2012-04-18 LAB — URINALYSIS, ROUTINE W REFLEX MICROSCOPIC
Leukocytes, UA: NEGATIVE
Nitrite: NEGATIVE
Total Protein, Urine: NEGATIVE
Urobilinogen, UA: 0.2 (ref 0.0–1.0)

## 2012-04-18 LAB — CBC WITH DIFFERENTIAL/PLATELET
Basophils Relative: 0.7 % (ref 0.0–3.0)
Eosinophils Absolute: 0.5 10*3/uL (ref 0.0–0.7)
HCT: 42 % (ref 39.0–52.0)
Lymphs Abs: 1.8 10*3/uL (ref 0.7–4.0)
MCHC: 33.8 g/dL (ref 30.0–36.0)
MCV: 93.9 fl (ref 78.0–100.0)
Monocytes Absolute: 0.6 10*3/uL (ref 0.1–1.0)
Neutrophils Relative %: 45 % (ref 43.0–77.0)
Platelets: 222 10*3/uL (ref 150.0–400.0)

## 2012-04-18 LAB — TSH: TSH: 4.02 u[IU]/mL (ref 0.35–5.50)

## 2012-04-18 LAB — COMPREHENSIVE METABOLIC PANEL
AST: 23 U/L (ref 0–37)
Albumin: 3.9 g/dL (ref 3.5–5.2)
Alkaline Phosphatase: 72 U/L (ref 39–117)
Potassium: 4.3 mEq/L (ref 3.5–5.1)
Sodium: 137 mEq/L (ref 135–145)
Total Bilirubin: 1.4 mg/dL — ABNORMAL HIGH (ref 0.3–1.2)
Total Protein: 7.2 g/dL (ref 6.0–8.3)

## 2012-04-18 LAB — LIPID PANEL
HDL: 84.1 mg/dL (ref >39.00)
Total CHOL/HDL Ratio: 3
Triglycerides: 98 mg/dL (ref 0.0–149.0)
VLDL: 19.6 mg/dL (ref 0.0–40.0)

## 2012-04-18 LAB — LDL CHOLESTEROL, DIRECT: Direct LDL: 182.8 mg/dL

## 2012-04-18 NOTE — Assessment & Plan Note (Signed)
I will check his PSA and his UA today

## 2012-04-18 NOTE — Assessment & Plan Note (Signed)
FLP today 

## 2012-04-18 NOTE — Assessment & Plan Note (Signed)

## 2012-04-18 NOTE — Assessment & Plan Note (Signed)
His BP is well controlled 

## 2012-04-18 NOTE — Progress Notes (Signed)
Subjective:    Patient ID: Lee Blevins, male    DOB: 1945/08/27, 67 y.o.   MRN: [id number]  HPI  He returns for a physical.   Review of Systems  Constitutional: Negative.   HENT: Negative.   Eyes: Negative.   Respiratory: Negative for cough, chest tightness, shortness of breath, wheezing and stridor.   Cardiovascular: Negative for chest pain, palpitations and leg swelling.  Gastrointestinal: Negative.   Genitourinary: Positive for difficulty urinating (nocturia 1-2 times per night). Negative for dysuria, urgency, frequency, hematuria, flank pain, decreased urine volume, discharge, penile swelling, scrotal swelling, enuresis, genital sores and penile pain.  Musculoskeletal: Negative.   Skin: Negative.   Neurological: Negative for dizziness, facial asymmetry, weakness, light-headedness and headaches.  Hematological: Negative for adenopathy. Does not bruise/bleed easily.  Psychiatric/Behavioral: Negative.        Objective:   Physical Exam  Vitals reviewed. Constitutional: He is oriented to person, place, and time. He appears well-developed and well-nourished. No distress.  HENT:  Head: Normocephalic and atraumatic.  Mouth/Throat: Oropharynx is clear and moist. No oropharyngeal exudate.  Eyes: Conjunctivae normal are normal. Right eye exhibits no discharge. Left eye exhibits no discharge. No scleral icterus.  Neck: Normal range of motion. Neck supple. No JVD present. No tracheal deviation present. No thyromegaly present.  Cardiovascular: Normal rate, regular rhythm, normal heart sounds and intact distal pulses.  Exam reveals no gallop and no friction rub.   No murmur heard. Pulmonary/Chest: Effort normal and breath sounds normal. No stridor. No respiratory distress. He has no wheezes. He has no rales. He exhibits no tenderness.  Abdominal: Soft. Bowel sounds are normal. He exhibits no distension and no mass. There is no tenderness. There is no rebound and no guarding. Hernia  confirmed negative in the right inguinal area and confirmed negative in the left inguinal area.  Genitourinary: Rectum normal, testes normal and penis normal. Rectal exam shows no external hemorrhoid, no internal hemorrhoid, no fissure, no mass, no tenderness and anal tone normal. Guaiac negative stool. Prostate is enlarged (smooth symmetrical  1+ BPH). Prostate is not tender. Right testis shows no mass, no swelling and no tenderness. Right testis is descended. Left testis shows no mass, no swelling and no tenderness. Left testis is descended. Circumcised. No penile erythema or penile tenderness. No discharge found.  Musculoskeletal: Normal range of motion. He exhibits no edema and no tenderness.  Lymphadenopathy:    He has no cervical adenopathy.       Right: No inguinal adenopathy present.       Left: No inguinal adenopathy present.  Neurological: He is oriented to person, place, and time.  Skin: Skin is warm and dry. No rash noted. He is not diaphoretic. No erythema. No pallor.  Psychiatric: He has a normal mood and affect. His behavior is normal. Judgment and thought content normal.      Lab Results  Component Value Date   WBC 6.1 06/10/2010   HGB 15.1 06/10/2010   HCT 43.7 06/10/2010   PLT 225.0 06/10/2010   GLUCOSE 91 06/10/2010   CHOL 144 06/10/2010   TRIG 63.0 06/10/2010   HDL 77.50 06/10/2010   LDLDIRECT 192.8 04/27/2010   LDLCALC 54 06/10/2010   ALT 20 06/10/2010   AST 25 06/10/2010   NA 140 06/10/2010   K 3.6 06/10/2010   CL 101 06/10/2010   CREATININE 0.9 06/10/2010   BUN 12 06/10/2010   CO2 29 06/10/2010   TSH 2.37 06/10/2010   INR 0.95 04/10/2010  Assessment & Plan:

## 2012-04-18 NOTE — Patient Instructions (Signed)
Health Maintenance, Males A healthy lifestyle and preventative care can promote health and wellness.  Maintain regular health, dental, and eye exams.  Eat a healthy diet. Foods like vegetables, fruits, whole grains, low-fat dairy products, and lean protein foods contain the nutrients you need without too many calories. Decrease your intake of foods high in solid fats, added sugars, and salt. Get information about a proper diet from your caregiver, if necessary.  Regular physical exercise is one of the most important things you can do for your health. Most adults should get at least 150 minutes of moderate-intensity exercise (any activity that increases your heart rate and causes you to sweat) each week. In addition, most adults need muscle-strengthening exercises on 2 or more days a week.   Maintain a healthy weight. The body mass index (BMI) is a screening tool to identify possible weight problems. It provides an estimate of body fat based on height and weight. Your caregiver can help determine your BMI, and can help you achieve or maintain a healthy weight. For adults 20 years and older:  A BMI below 18.5 is considered underweight.  A BMI of 18.5 to 24.9 is normal.  A BMI of 25 to 29.9 is considered overweight.  A BMI of 30 and above is considered obese.  Maintain normal blood lipids and cholesterol by exercising and minimizing your intake of saturated fat. Eat a balanced diet with plenty of fruits and vegetables. Blood tests for lipids and cholesterol should begin at age 20 and be repeated every 5 years. If your lipid or cholesterol levels are high, you are over 50, or you are a high risk for heart disease, you may need your cholesterol levels checked more frequently.Ongoing high lipid and cholesterol levels should be treated with medicines, if diet and exercise are not effective.  If you smoke, find out from your caregiver how to quit. If you do not use tobacco, do not start.  If you  choose to drink alcohol, do not exceed 2 drinks per day. One drink is considered to be 12 ounces (355 mL) of beer, 5 ounces (148 mL) of wine, or 1.5 ounces (44 mL) of liquor.  Avoid use of street drugs. Do not share needles with anyone. Ask for help if you need support or instructions about stopping the use of drugs.  High blood pressure causes heart disease and increases the risk of stroke. Blood pressure should be checked at least every 1 to 2 years. Ongoing high blood pressure should be treated with medicines if weight loss and exercise are not effective.  If you are 45 to 69 years old, ask your caregiver if you should take aspirin to prevent heart disease.  Diabetes screening involves taking a blood sample to check your fasting blood sugar level. This should be done once every 3 years, after age 45, if you are within normal weight and without risk factors for diabetes. Testing should be considered at a younger age or be carried out more frequently if you are overweight and have at least 1 risk factor for diabetes.  Colorectal cancer can be detected and often prevented. Most routine colorectal cancer screening begins at the age of 50 and continues through age 75. However, your caregiver may recommend screening at an earlier age if you have risk factors for colon cancer. On a yearly basis, your caregiver may provide home test kits to check for hidden blood in the stool. Use of a small camera at the end of a tube,   to directly examine the colon (sigmoidoscopy or colonoscopy), can detect the earliest forms of colorectal cancer. Talk to your caregiver about this at age 50, when routine screening begins. Direct examination of the colon should be repeated every 5 to 10 years through age 75, unless early forms of pre-cancerous polyps or small growths are found.  Hepatitis C blood testing is recommended for all people born from 1945 through 1965 and any individual with known risks for hepatitis C.  Healthy  men should no longer receive prostate-specific antigen (PSA) blood tests as part of routine cancer screening. Consult with your caregiver about prostate cancer screening.  Testicular cancer screening is not recommended for adolescents or adult males who have no symptoms. Screening includes self-exam, caregiver exam, and other screening tests. Consult with your caregiver about any symptoms you have or any concerns you have about testicular cancer.  Practice safe sex. Use condoms and avoid high-risk sexual practices to reduce the spread of sexually transmitted infections (STIs).  Use sunscreen with a sun protection factor (SPF) of 30 or greater. Apply sunscreen liberally and repeatedly throughout the day. You should seek shade when your shadow is shorter than you. Protect yourself by wearing long sleeves, pants, a wide-brimmed hat, and sunglasses year round, whenever you are outdoors.  Notify your caregiver of new moles or changes in moles, especially if there is a change in shape or color. Also notify your caregiver if a mole is larger than the size of a pencil eraser.  A one-time screening for abdominal aortic aneurysm (AAA) and surgical repair of large AAAs by sound wave imaging (ultrasonography) is recommended for ages 65 to 75 years who are current or former smokers.  Stay current with your immunizations. Document Released: 09/30/2007 Document Revised: 06/26/2011 Document Reviewed: 08/29/2010 [employer] Patient Information 2013 [employer].  

## 2012-06-09 ENCOUNTER — Other Ambulatory Visit: Payer: Self-pay | Admitting: Internal Medicine

## 2012-07-22 ENCOUNTER — Telehealth: Payer: Self-pay | Admitting: Internal Medicine

## 2012-07-22 NOTE — Telephone Encounter (Signed)
Patient would like either more samples of Benicare because he is almost out or either a RX.  Can call in to [facility] on [address].  Patient would like a phone call back.   Patient is getting ready to go out of town and would like something before he leaves.

## 2012-07-22 NOTE — Telephone Encounter (Signed)
Pt notified that Rx still on hold from 01/2012 per [facility]

## 2012-08-30 ENCOUNTER — Encounter: Payer: Self-pay | Admitting: Internal Medicine

## 2012-08-30 ENCOUNTER — Ambulatory Visit (INDEPENDENT_AMBULATORY_CARE_PROVIDER_SITE_OTHER): Payer: Medicare Other | Admitting: Internal Medicine

## 2012-08-30 VITALS — BP 134/88 | HR 76 | Temp 97.9°F | Resp 16 | Wt 193.0 lb

## 2012-08-30 DIAGNOSIS — L02419 Cutaneous abscess of limb, unspecified: Secondary | ICD-10-CM

## 2012-08-30 DIAGNOSIS — L03119 Cellulitis of unspecified part of limb: Secondary | ICD-10-CM | POA: Diagnosis not present

## 2012-08-30 MED ORDER — DOXYCYCLINE HYCLATE 100 MG PO TABS: 100.0000 mg | ORAL_TABLET | Freq: Two times a day (BID) | ORAL | Status: DC

## 2012-08-30 MED ORDER — MUPIROCIN 2 % EX OINT: TOPICAL_OINTMENT | CUTANEOUS | Status: DC

## 2012-08-30 NOTE — Patient Instructions (Addendum)

## 2012-08-30 NOTE — Progress Notes (Signed)
  Subjective:    Patient ID: Lee Blevins, male    DOB: Jul 13, 1945, 67 y.o.   MRN: [id number]  HPI  C/o L knee swelling and pain, redness. There was a scratch prior  Review of Systems  Constitutional: Negative for fever and chills.  Musculoskeletal: Negative for myalgias.       Objective:   Physical Exam  Musculoskeletal:  L knee with antero-infero-lateral apect of the L knee with a central abscess 1 cm w/a scab, tender  Skin: There is erythema.      Procedure Note :    Procedure :   Point of care (POC) sonography examination   Indication: L knee redness and swelling   Equipment used: Sonosite M-Turbo with HFL38x/13-6 MHz transducer linear probe. The images were stored in the unit and later transferred in storage.  The patient was placed in a decubitus position.  This study revealed a hypoechoic sq lesion in the center of the red area   Impression: Skin abscess L infero-lateral knee, small   Procedure note:  Incision and Drainage of an Abscess   Indication : a localized collection of pus in the left knee (skin) that is tender and not spontaneously resolving.    Risks including unsuccessful procedure , possible need for a repeat procedure due to pus accumulation, scar formation, and others as well as benefits were explained to the patient in detail. Written consent was obtained/signed.    The patient was placed in a decubitus position. The area of an abscess was prepped with povidone-iodine and draped in a sterile fashion. Local anesthesia with 1  cc of 2% lidocaine and epinephrine  was administered.  1 cm incision with #11strait blade was made. About 0.5 cc of purulent material was expressed. The abscess cavity was explored with a sterile hemostat and the walled- off pockets and septae were broken down bluntly. The cavity was irrigated with the rest of the anesthetic in the syringe and packed with 1 inch of  the iodoform gauze.   The wound was dressed with antibiotic  ointment and Telfa pad.  Tolerated well. Complications: None.   Wound instructions provided.   Wound instructions : change dressing once a day or twice a day is needed. Change dressing after  shower in the morning.  Pat dry the wound with gauze. Pull out one inch of packing everyday and cut it off. Re-dress wound with antibiotic ointment and Telfa pad or a Band-Aid of appropriate size.   Please contact us if you notice a recollection of pus in the abscess fever and chills increased pain redness red streaks near the abscess increased swelling in the area.     Assessment & Plan:

## 2012-08-31 ENCOUNTER — Encounter: Payer: Self-pay | Admitting: Internal Medicine

## 2012-08-31 DIAGNOSIS — L02419 Cutaneous abscess of limb, unspecified: Secondary | ICD-10-CM | POA: Insufficient documentation

## 2012-09-02 ENCOUNTER — Telehealth: Payer: Self-pay | Admitting: Internal Medicine

## 2012-09-02 NOTE — Telephone Encounter (Signed)
Spoke with pt, he states that he thinks he still has some of the packaging still stuck in the wound. Pt denies any fever or chills. Pt states that the wound is still swollen & redness. Pt scheduled for a follow-up.

## 2012-09-02 NOTE — Telephone Encounter (Signed)
Pt req a call back concern about the wound cleaning instruction that pt has to do. Pt stated that on the first day of cleaning, the gauze paper fall out and pt was wondering if there is anymore gauze paper in the wound. Pt is not very clear about the wound cleaning instruction, please call pt.

## 2012-09-04 ENCOUNTER — Other Ambulatory Visit: Payer: Medicare Other

## 2012-09-04 ENCOUNTER — Encounter: Payer: Self-pay | Admitting: Internal Medicine

## 2012-09-04 ENCOUNTER — Ambulatory Visit (INDEPENDENT_AMBULATORY_CARE_PROVIDER_SITE_OTHER): Payer: Medicare Other | Admitting: Internal Medicine

## 2012-09-04 VITALS — BP 148/86 | HR 76 | Temp 97.3°F | Resp 16 | Wt 192.0 lb

## 2012-09-04 DIAGNOSIS — L03119 Cellulitis of unspecified part of limb: Secondary | ICD-10-CM | POA: Diagnosis not present

## 2012-09-04 DIAGNOSIS — L02419 Cutaneous abscess of limb, unspecified: Secondary | ICD-10-CM

## 2012-09-04 NOTE — Patient Instructions (Signed)

## 2012-09-06 ENCOUNTER — Encounter: Payer: Self-pay | Admitting: Internal Medicine

## 2012-09-06 ENCOUNTER — Ambulatory Visit (INDEPENDENT_AMBULATORY_CARE_PROVIDER_SITE_OTHER): Payer: Medicare Other | Admitting: Internal Medicine

## 2012-09-06 VITALS — BP 140/72 | HR 77 | Temp 98.6°F | Resp 16 | Wt 192.0 lb

## 2012-09-06 DIAGNOSIS — L02419 Cutaneous abscess of limb, unspecified: Secondary | ICD-10-CM

## 2012-09-06 DIAGNOSIS — L03119 Cellulitis of unspecified part of limb: Secondary | ICD-10-CM

## 2012-09-06 NOTE — Assessment & Plan Note (Signed)
He will keep the iodoform packing in place I await the clx results He will RTC in 2 days for packing removal and recheck

## 2012-09-06 NOTE — Progress Notes (Signed)
  Subjective:    Patient ID: Lee Blevins, male    DOB: Apr 14, 1946, 67 y.o.   MRN: [id number]  Wound Check He was originally treated 5 to 10 days ago. Previous treatment included I&D of abscess, oral antibiotics and wound cleansing or irrigation. His temperature was unmeasured prior to arrival. There has been bloody discharge from the wound. The redness has worsened. The swelling has worsened. The pain has worsened. There is difficulty moving the extremity or digit due to pain.      Review of Systems  Constitutional: Negative.  Negative for fever, chills, diaphoresis and fatigue.  HENT: Negative.   Eyes: Negative.   Respiratory: Negative.   Cardiovascular: Negative.   Gastrointestinal: Negative.   Endocrine: Negative.   Genitourinary: Negative.   Musculoskeletal: Negative.   Skin: Negative.   Allergic/Immunologic: Negative.   Neurological: Negative.  Negative for dizziness and light-headedness.  Hematological: Negative for adenopathy. Does not bruise/bleed easily.  Psychiatric/Behavioral: Negative.        Objective:   Physical Exam  Musculoskeletal:       Left knee: He exhibits swelling, deformity and erythema. He exhibits normal range of motion, no effusion, no ecchymosis, no laceration, normal alignment, no LCL laxity and normal patellar mobility. Tenderness found.       Legs: Over the left anterior knee there is a central excoriation over the distal patella, surrounding this is a 4 cm area of warmth, induration, tenderness, faint streaking laterally.  Skin:  Left knee was prepped and draped in sterile fashion, skin cleaned with betadine, local anesthesia was obtained with lido/epi, 3 cc's were used. Next a 4 mm punch incision was made and a deep, loculated cavity was found and a hematoma was released, there was no exudate/FB, the cavity was explored and cleaned with H2O2 then it was packed with iodoform and a dressing was applied. A clx was sent. He tolerated the proc well, no  complications.          Assessment & Plan:

## 2012-09-06 NOTE — Progress Notes (Signed)
  Subjective:    Patient ID: Lee Blevins, male    DOB: Nov 03, 1945, 67 y.o.   MRN: [id number]  Wound Check He was originally treated 5 to 10 days ago. Previous treatment included I&D of abscess and oral antibiotics. His temperature was unmeasured prior to arrival. There has been no drainage from the wound. The redness has improved. The swelling has improved. The pain has no pain. He has no difficulty moving the affected extremity or digit.      Review of Systems  Constitutional: Negative.  Negative for chills, diaphoresis and fatigue.  HENT: Negative.   Eyes: Negative.   Respiratory: Negative.   Cardiovascular: Negative.   Gastrointestinal: Negative.   Endocrine: Negative.   Genitourinary: Negative.   Musculoskeletal: Negative.   Skin: Negative.   Allergic/Immunologic: Negative.   Neurological: Negative.   Hematological: Negative.   Psychiatric/Behavioral: Negative.        Objective:   Physical Exam  Vitals reviewed. Constitutional: He is oriented to person, place, and time. He appears well-developed and well-nourished.  Non-toxic appearance. He does not have a sickly appearance. He does not appear ill. No distress.  Eyes: Conjunctivae are normal. Right eye exhibits no discharge. Left eye exhibits no discharge. No scleral icterus.  Neck: Normal range of motion. Neck supple. No JVD present. No tracheal deviation present. No thyromegaly present.  Cardiovascular: Normal rate, regular rhythm, normal heart sounds and intact distal pulses.  Exam reveals no gallop and no friction rub.   No murmur heard. Pulmonary/Chest: Effort normal and breath sounds normal. No stridor. No respiratory distress. He has no wheezes. He has no rales. He exhibits no tenderness.  Abdominal: Soft. Bowel sounds are normal. He exhibits no distension and no mass. There is no tenderness. There is no rebound and no guarding.  Musculoskeletal: Normal range of motion. He exhibits no edema and no tenderness.   Left knee: He exhibits deformity and erythema. He exhibits normal range of motion, no swelling, no effusion, no ecchymosis and no laceration. No tenderness found.  Left knee examined, the iodoform packing was removed from the I and D sight, there is no blood,exudate,hematoma,FB. The surrounding erythema has diminished quite a bit. There is no warmth, induration, streaking. The clx is negative.  Lymphadenopathy:    He has no cervical adenopathy.  Neurological: He is oriented to person, place, and time.  Skin: Skin is warm and dry. No rash noted. He is not diaphoretic. There is erythema. No pallor.          Assessment & Plan:

## 2012-09-06 NOTE — Assessment & Plan Note (Signed)
Improvement noted Wound care was d/w him

## 2012-09-18 ENCOUNTER — Other Ambulatory Visit: Payer: Self-pay | Admitting: Internal Medicine

## 2012-10-23 ENCOUNTER — Ambulatory Visit (INDEPENDENT_AMBULATORY_CARE_PROVIDER_SITE_OTHER): Payer: Medicare Other | Admitting: Internal Medicine

## 2012-10-23 ENCOUNTER — Encounter: Payer: Self-pay | Admitting: Internal Medicine

## 2012-10-23 VITALS — BP 122/70 | HR 63 | Temp 98.5°F | Resp 16 | Wt 189.0 lb

## 2012-10-23 DIAGNOSIS — J328 Other chronic sinusitis: Secondary | ICD-10-CM | POA: Diagnosis not present

## 2012-10-23 DIAGNOSIS — E785 Hyperlipidemia, unspecified: Secondary | ICD-10-CM | POA: Diagnosis not present

## 2012-10-23 DIAGNOSIS — R439 Unspecified disturbances of smell and taste: Secondary | ICD-10-CM

## 2012-10-23 DIAGNOSIS — I10 Essential (primary) hypertension: Secondary | ICD-10-CM

## 2012-10-23 DIAGNOSIS — Z888 Allergy status to other drugs, medicaments and biological substances status: Secondary | ICD-10-CM | POA: Diagnosis not present

## 2012-10-23 DIAGNOSIS — R430 Anosmia: Secondary | ICD-10-CM

## 2012-10-23 MED ORDER — EZETIMIBE 10 MG PO TABS: 10.0000 mg | ORAL_TABLET | Freq: Every day | ORAL | Status: DC

## 2012-10-23 MED ORDER — COLESEVELAM HCL 3.75 G PO PACK: 1.0000 | PACK | Freq: Every day | ORAL | Status: DC

## 2012-10-23 MED ORDER — METHYLPREDNISOLONE 4 MG PO KIT: PACK | ORAL | Status: DC

## 2012-10-23 NOTE — Progress Notes (Signed)
Subjective:    Patient ID: Lee Blevins, male    DOB: 01-Dec-1945, 67 y.o.   MRN: [id number]  Sinusitis This is a recurrent problem. The current episode started more than 1 year ago. The problem is unchanged. There has been no fever. The fever has been present for less than 1 day. His pain is at a severity of 0/10. He is experiencing no pain. Associated symptoms include congestion. Pertinent negatives include no chills, coughing, diaphoresis, ear pain, headaches, hoarse voice, neck pain, shortness of breath, sinus pressure, sneezing, sore throat or swollen glands. Past treatments include spray decongestants. The treatment provided mild relief.      Review of Systems  Constitutional: Negative.  Negative for chills and diaphoresis.  HENT: Positive for congestion, rhinorrhea and postnasal drip. Negative for ear pain, nosebleeds, sore throat, hoarse voice, facial swelling, sneezing, mouth sores, trouble swallowing, neck pain and sinus pressure.   Eyes: Negative.   Respiratory: Negative for cough, chest tightness, shortness of breath, wheezing and stridor.   Cardiovascular: Negative.  Negative for chest pain, palpitations and leg swelling.  Gastrointestinal: Negative.  Negative for nausea, vomiting, abdominal pain, diarrhea and constipation.  Endocrine: Negative.   Genitourinary: Negative.   Musculoskeletal: Negative.   Skin: Negative.   Allergic/Immunologic: Negative.   Neurological: Negative.  Negative for dizziness, speech difficulty, weakness, light-headedness and headaches.  Hematological: Negative.  Negative for adenopathy. Does not bruise/bleed easily.  Psychiatric/Behavioral: Negative.        Objective:   Physical Exam  Vitals reviewed. Constitutional: He is oriented to person, place, and time. He appears well-developed and well-nourished. No distress.  HENT:  Head: Normocephalic and atraumatic. No trismus in the jaw.  Right Ear: Hearing, tympanic membrane, external ear and ear  canal normal.  Left Ear: Hearing, tympanic membrane, external ear and ear canal normal.  Nose: Mucosal edema and rhinorrhea present. No sinus tenderness or nasal deformity. No epistaxis. Right sinus exhibits no maxillary sinus tenderness and no frontal sinus tenderness. Left sinus exhibits no maxillary sinus tenderness and no frontal sinus tenderness.  Mouth/Throat: Oropharynx is clear and moist and mucous membranes are normal. Mucous membranes are not pale, not dry and not cyanotic. No oral lesions. No edematous. No oropharyngeal exudate, posterior oropharyngeal edema, posterior oropharyngeal erythema or tonsillar abscesses.  Eyes: Conjunctivae are normal. Right eye exhibits no discharge. Left eye exhibits no discharge. No scleral icterus.  Neck: Normal range of motion. Neck supple. No JVD present. No tracheal deviation present. No thyromegaly present.  Cardiovascular: Normal rate, regular rhythm, normal heart sounds and intact distal pulses.  Exam reveals no gallop and no friction rub.   No murmur heard. Pulmonary/Chest: Effort normal and breath sounds normal. No stridor. No respiratory distress. He has no wheezes. He has no rales. He exhibits no tenderness.  Abdominal: Soft. Bowel sounds are normal. He exhibits no distension and no mass. There is no tenderness. There is no rebound and no guarding.  Musculoskeletal: Normal range of motion. He exhibits no edema and no tenderness.  Lymphadenopathy:    He has no cervical adenopathy.  Neurological: He is oriented to person, place, and time.  Skin: Skin is warm and dry. No rash noted. He is not diaphoretic. No erythema. No pallor.  Psychiatric: He has a normal mood and affect. His behavior is normal. Judgment and thought content normal.     Lab Results  Component Value Date   WBC 5.3 04/18/2012   HGB 14.2 04/18/2012   HCT 42.0 04/18/2012   PLT  222.0 04/18/2012   GLUCOSE 98 04/18/2012   CHOL 280* 04/18/2012   TRIG 98.0 04/18/2012   HDL 84.10 04/18/2012    LDLDIRECT 182.8 04/18/2012   LDLCALC 54 06/10/2010   ALT 14 04/18/2012   AST 23 04/18/2012   NA 137 04/18/2012   K 4.3 04/18/2012   CL 101 04/18/2012   CREATININE 0.9 04/18/2012   BUN 13 04/18/2012   CO2 27 04/18/2012   TSH 4.02 04/18/2012   PSA 0.67 04/18/2012   INR 0.95 04/10/2010       Assessment & Plan:

## 2012-10-23 NOTE — Patient Instructions (Signed)

## 2012-10-23 NOTE — Assessment & Plan Note (Signed)
Will try welchol and zetia since he will not take a statin

## 2012-10-23 NOTE — Assessment & Plan Note (Signed)
His BP is well controlled 

## 2012-10-23 NOTE — Assessment & Plan Note (Signed)
He is having a flare of symptoms so I gave him an Rx for medrol dose pak

## 2012-11-07 ENCOUNTER — Encounter: Payer: Self-pay | Admitting: Internal Medicine

## 2012-11-07 ENCOUNTER — Ambulatory Visit (INDEPENDENT_AMBULATORY_CARE_PROVIDER_SITE_OTHER): Payer: Medicare Other | Admitting: Internal Medicine

## 2012-11-07 VITALS — BP 134/64 | HR 74 | Ht 72.0 in | Wt 190.4 lb

## 2012-11-07 DIAGNOSIS — J330 Polyp of nasal cavity: Secondary | ICD-10-CM

## 2012-11-07 DIAGNOSIS — J45901 Unspecified asthma with (acute) exacerbation: Secondary | ICD-10-CM | POA: Diagnosis not present

## 2012-11-07 DIAGNOSIS — J4531 Mild persistent asthma with (acute) exacerbation: Secondary | ICD-10-CM

## 2012-11-07 MED ORDER — METHYLPREDNISOLONE ACETATE 80 MG/ML IJ SUSP
80.0000 mg | Freq: Once | INTRAMUSCULAR | Status: AC
  Administered 2012-11-07: 80 mg via INTRAMUSCULAR

## 2012-11-07 NOTE — Patient Instructions (Addendum)
Depo 17  Order- referral return to Dr Christia Reading ENT for recurrent nasal polyps  Continue Flonase

## 2012-11-07 NOTE — Progress Notes (Signed)
Subjective:    Patient ID: Lee Blevins, male    DOB: [birth year], 67 y.o.   MRN: [id number]  HPI 11/03/10- followed for asthma, chronic sinusitis, nasal polyps complicated by hx CVA, HBP Last here November 04, 2009- note reviewed He had a hemorrhagic CVA 04/10/10- hosp at [facility]- right arm weak- resolved.  Has not had significant changes in breathing or nasal symptoms. He is walking 6 miles/ week and tolerating that well. He remains dependent on prednisone 5 mg daily, Advair 1 puff daily, and singulair, nasonex. Coughs up clear mucus at times. No wheeze. Doesn't have a rescue inhaler and doesn't think he needs one- never wheezes now.  11/06/11-65 yoM former smoker followed for asthma, chronic sinusitis, nasal polyps complicated by hx CVA, HBP Pt states having more problems with sinuses .states having some wheezing ,chest congestion . Has had decreased sense of smell for 6 weeks. His previous ENT doctor has retired. Persistent morning cough productive of thick sputum which clears each day. Denies shortness of breath and walks regularly. No headache or fever. Off of prednisone maintenance now for 5 months but continues Nasonex and Singulair with Advair 1 puff twice daily.  11/07/12- 53 yoM former smoker followed for asthma, chronic sinusitis, nasal polyps complicated by hx CVA, HBP FOLLOWS FOR:no SOB, wheezing, cough, or congestion out of the usual since last visit. Mild nasal congestion, taste and smell come and go. Takes daily  Baby aspirin with history of  nasal polyps. Chest okay on Advair.  ROS-see HPI Constitutional:   No-   weight loss, night sweats, fevers, chills, fatigue, lassitude. HEENT:   No-  headaches, difficulty swallowing, tooth/dental problems, sore throat,       No-  sneezing, itching, ear ache, +nasal congestion, post nasal drip,  CV:  No-   chest pain, orthopnea, PND, swelling in lower extremities, anasarca, dizziness, palpitations Resp: No-   shortness of breath with exertion or at  rest.             No-   productive cough,  No non-productive cough,  No- coughing up of blood.              No-   change in color of mucus.  No- wheezing.   Skin: No-   rash or lesions. GI:  No-   heartburn, indigestion, abdominal pain, nausea, vomiting,  GU:  MS:  No-   joint pain or swelling.   Neuro-     nothing unusual Psych:  No- change in mood or affect. No depression or anxiety.  No memory loss.  Objective:   Physical Exam General- Alert, Oriented, Affect-appropriate, Distress- none acute, trim Skin- rash-none, lesions- none, excoriation- none Lymphadenopathy- none Head- atraumatic            Eyes- Gross vision intact, PERRLA, conjunctivae clear secretions            Ears- Hearing, canals normal            Nose- Clear, No- Septal dev, mucus, polyp+ visible on right, no-erosion, perforation             Throat- Mallampati II-III , mucosa clear , drainage- none, tonsils- atrophic Neck- flexible , trachea midline, no stridor , thyroid nl, carotid no bruit Chest - symmetrical excursion , unlabored           Heart/CV- RRR , no murmur , no gallop  , no rub, nl s1 s2                           -  JVD- none , edema- none, stasis changes- none, varices- none           Lung- clear to P&A, wheeze- none, cough- none , dullness-none, rub- none           Chest wall-  Abd-  Br/ Gen/ Rectal- Not done, not indicated Extrem- cyanosis- none, clubbing, none, atrophy- none, strength- nl Neuro- grossly intact to observation  Assessment & Plan:

## 2012-11-23 ENCOUNTER — Encounter: Payer: Self-pay | Admitting: Internal Medicine

## 2012-11-23 NOTE — Assessment & Plan Note (Signed)
Controlled on Advair .  

## 2012-11-23 NOTE — Assessment & Plan Note (Addendum)
Recurrent nasal polyps contributing to nasal congestion and interfering with taste and smell. Plan-ENT followup with Dr.Bates, Depo-Medrol, saline rinse nasal steroid

## 2012-12-02 DIAGNOSIS — J328 Other chronic sinusitis: Secondary | ICD-10-CM | POA: Diagnosis not present

## 2012-12-02 DIAGNOSIS — R439 Unspecified disturbances of smell and taste: Secondary | ICD-10-CM | POA: Diagnosis not present

## 2012-12-19 ENCOUNTER — Encounter: Payer: Self-pay | Admitting: Neurology

## 2012-12-19 ENCOUNTER — Ambulatory Visit (INDEPENDENT_AMBULATORY_CARE_PROVIDER_SITE_OTHER): Payer: Medicare Other | Admitting: Neurology

## 2012-12-19 VITALS — BP 125/60 | HR 69 | Ht 73.0 in | Wt 182.0 lb

## 2012-12-19 DIAGNOSIS — I6789 Other cerebrovascular disease: Secondary | ICD-10-CM

## 2012-12-19 NOTE — Progress Notes (Signed)
[name] [address] [place]. [place] [address] [phone]       OFFICE FOLLOW-UP NOTE  Mr. 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 He continues to do well without recurrence stroke or TIA symptoms. He states his dizziness and vertigo seemed to have resolved after last visit and he felt they were related to medication effect from antihypertensives. He has since stopped aspirin for unclear reasons. He states his blood pressure has been under good control and it is 125/60 in office today. He does complain of feeling tired and dizzy when his blood pressure is in the 110 range. He has no new complaints today. MRI scan of the brain with and without on 10/30/11 showed no significant abnormalities. There was old left posterior temporal/occipital infarct and mild changes of chronic small vessel ischemic disease. ROS:   14 system review of systems is positive for  no complaints today  PMH:  Past Medical History  Diagnosis Date  . Asthma   . Pneumonia   . Chronic sinusitis   . Nasal polyps   . Hyperlipidemia   . HTN (hypertension)   . History of CVA (cerebrovascular accident)     Social History:  History   Social History  . Marital Status: Married    Spouse Name: N/A    Number of Children: N/A  . Years of Education: N/A   Occupational History  . RETIRED     Magazine features editor   Social History Main Topics  . Smoking status: Former Smoker    Types: Cigarettes    Quit date: 04/17/1973  . Smokeless tobacco: Never Used     Comment: 1975  . Alcohol Use: 2.4 oz/week    4 Cans of beer per week  . Drug Use: No  . Sexual Activity: Not Currently   Other Topics Concern  . Not on file   Social History Narrative   Regular Exercise -  YES     Medications:   Current Outpatient Prescriptions on File Prior to Visit  Medication Sig Dispense Refill  . fluticasone (FLONASE) 50 MCG/ACT nasal spray USE 1 SPRAY IN EACH NOSTIRL TWICE DAILY  16 g  11  . Fluticasone-Salmeterol (ADVAIR DISKUS) 250-50 MCG/DOSE AEPB Inhale 1 puff into the lungs 2 (two) times daily.  180 each  1  . montelukast (SINGULAIR) 10 MG tablet TAKE 1 TABLET BY MOUTH AT BEDTIME  90 tablet  3  . olmesartan (BENICAR) 20 MG tablet Take 1 tablet (20 mg total) by mouth daily.  90 tablet  3   No current facility-administered medications on file prior to visit.    Allergies:   Allergies  Allergen Reactions  . Amlodipine     edema  . Crestor [Rosuvastatin]     Muscle aches  . Livalo [Pitavastatin]     Muscle aches  . Bystolic [Nebivolol Hcl]     dizziness    Physical Exam General: well developed, well nourished, seated, in no evident distress Head: head normocephalic and atraumatic. Orohparynx benign Neck: supple with no carotid or supraclavicular bruits Cardiovascular: regular rate and rhythm, no murmurs Musculoskeletal: no deformity Skin:  no rash/petichiae Vascular:  Normal pulses all extremities Filed Vitals:   12/19/12 1532  BP: 125/60  Pulse: 69    Neurologic Exam Mental Status:  Awake and fully alert. Oriented to place and time. Recent and remote memory intact. Attention span, concentration and fund of knowledge appropriate. Mood and affect appropriate.  Cranial Nerves: Fundoscopic exam not done. Pupils equal, briskly reactive to light. Extraocular movements full without nystagmus. Visual fields full to confrontation. Hearing intact. Facial sensation intact. Face, tongue, palate moves normally and symmetrically.  Motor: Normal bulk and tone. Normal strength in all tested extremity muscles. Sensory.: intact to tough and pinprick and vibratory.  Coordination: Rapid alternating movements normal in all extremities. Finger-to-nose and heel-to-shin  performed accurately bilaterally. Gait and Station: Arises from chair without difficulty. Stance is normal. Gait demonstrates normal stride length and balance . Able to heel, toe and tandem walk without difficulty.  Reflexes: 1+ and symmetric. Toes downgoing.     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   PLAN:  Start Aspirin 81 mg daily for stroke prevention and maintain strict control of HT with BP goal below 130/90. Follow up in future only as necessary.

## 2012-12-19 NOTE — Patient Instructions (Addendum)
Start Aspirin 81 mg daily for stroke prevention and maintain strict control of HT with BP goal below 130/90. Follow up in future only as necessary.

## 2013-02-24 ENCOUNTER — Ambulatory Visit: Payer: Medicare Other | Admitting: Internal Medicine

## 2013-02-24 DIAGNOSIS — Z0289 Encounter for other administrative examinations: Secondary | ICD-10-CM

## 2013-04-09 ENCOUNTER — Other Ambulatory Visit: Payer: Self-pay | Admitting: Internal Medicine

## 2013-05-19 ENCOUNTER — Other Ambulatory Visit: Payer: Self-pay | Admitting: Internal Medicine

## 2013-05-19 NOTE — Telephone Encounter (Signed)
CY, please advise if okay to send refill; patient seen 10/2012 and told to return if symptoms worsen or fail to improve; also sent to Dr Melida Quitter ENT for recurrent nasal polyps. Thanks.

## 2013-05-19 NOTE — Telephone Encounter (Signed)
Ok to refill x 1 year 

## 2013-06-13 ENCOUNTER — Encounter: Payer: Self-pay | Admitting: Internal Medicine

## 2013-06-13 ENCOUNTER — Ambulatory Visit (INDEPENDENT_AMBULATORY_CARE_PROVIDER_SITE_OTHER): Payer: Medicare Other | Admitting: Internal Medicine

## 2013-06-13 ENCOUNTER — Other Ambulatory Visit (INDEPENDENT_AMBULATORY_CARE_PROVIDER_SITE_OTHER): Payer: Medicare Other

## 2013-06-13 VITALS — BP 138/78 | HR 79 | Temp 98.1°F | Resp 16 | Ht 73.0 in | Wt 195.0 lb

## 2013-06-13 DIAGNOSIS — R1013 Epigastric pain: Secondary | ICD-10-CM

## 2013-06-13 DIAGNOSIS — I10 Essential (primary) hypertension: Secondary | ICD-10-CM | POA: Diagnosis not present

## 2013-06-13 DIAGNOSIS — E785 Hyperlipidemia, unspecified: Secondary | ICD-10-CM

## 2013-06-13 LAB — CBC WITH DIFFERENTIAL/PLATELET
BASOS ABS: 0 10*3/uL (ref 0.0–0.1)
Basophils Relative: 0.5 % (ref 0.0–3.0)
EOS ABS: 0.2 10*3/uL (ref 0.0–0.7)
Eosinophils Relative: 3.4 % (ref 0.0–5.0)
HCT: 46.5 % (ref 39.0–52.0)
Hemoglobin: 15.2 g/dL (ref 13.0–17.0)
Lymphocytes Relative: 30.1 % (ref 12.0–46.0)
Lymphs Abs: 2 10*3/uL (ref 0.7–4.0)
MCHC: 32.6 g/dL (ref 30.0–36.0)
MCV: 96.6 fl (ref 78.0–100.0)
Monocytes Absolute: 0.7 10*3/uL (ref 0.1–1.0)
Monocytes Relative: 10.1 % (ref 3.0–12.0)
NEUTROS PCT: 55.9 % (ref 43.0–77.0)
Neutro Abs: 3.7 10*3/uL (ref 1.4–7.7)
Platelets: 221 10*3/uL (ref 150.0–400.0)
RBC: 4.82 Mil/uL (ref 4.22–5.81)
RDW: 13.4 % (ref 11.5–14.6)
WBC: 6.5 10*3/uL (ref 4.5–10.5)

## 2013-06-13 LAB — LIPID PANEL
CHOLESTEROL: 285 mg/dL — AB (ref 0–200)
HDL: 102.6 mg/dL (ref >39.00)
Total CHOL/HDL Ratio: 3
Triglycerides: 70 mg/dL (ref 0.0–149.0)
VLDL: 14 mg/dL (ref 0.0–40.0)

## 2013-06-13 LAB — URINALYSIS, ROUTINE W REFLEX MICROSCOPIC
KETONES UR: 15 — AB
Leukocytes, UA: NEGATIVE
NITRITE: NEGATIVE
PH: 5.5 (ref 5.0–8.0)
Specific Gravity, Urine: >=1.03 — AB (ref 1.000–1.030)
Total Protein, Urine: NEGATIVE
URINE GLUCOSE: NEGATIVE
Urobilinogen, UA: 0.2 (ref 0.0–1.0)

## 2013-06-13 LAB — COMPREHENSIVE METABOLIC PANEL
ALT: 15 U/L (ref 0–53)
AST: 21 U/L (ref 0–37)
Albumin: 4.2 g/dL (ref 3.5–5.2)
Alkaline Phosphatase: 57 U/L (ref 39–117)
BUN: 15 mg/dL (ref 6–23)
CALCIUM: 10.1 mg/dL (ref 8.4–10.5)
CHLORIDE: 101 meq/L (ref 96–112)
CO2: 30 mEq/L (ref 19–32)
Creatinine, Ser: 0.9 mg/dL (ref 0.4–1.5)
GFR: 87.66 mL/min (ref >60.00)
Glucose, Bld: 85 mg/dL (ref 70–99)
POTASSIUM: 4.2 meq/L (ref 3.5–5.1)
Sodium: 138 mEq/L (ref 135–145)
Total Bilirubin: 1.3 mg/dL — ABNORMAL HIGH (ref 0.3–1.2)
Total Protein: 7.8 g/dL (ref 6.0–8.3)

## 2013-06-13 LAB — AMYLASE: AMYLASE: 39 U/L (ref 27–131)

## 2013-06-13 LAB — LIPASE: LIPASE: 18 U/L (ref 11.0–59.0)

## 2013-06-13 LAB — LDL CHOLESTEROL, DIRECT: LDL DIRECT: 160 mg/dL

## 2013-06-13 MED ORDER — ESOMEPRAZOLE MAGNESIUM 40 MG PO CPDR: 40.0000 mg | DELAYED_RELEASE_CAPSULE | Freq: Every day | ORAL | Status: DC

## 2013-06-13 NOTE — Assessment & Plan Note (Signed)
His BP is well controlled Today I will check his lytes and renal function 

## 2013-06-13 NOTE — Assessment & Plan Note (Signed)
Will recheck the [place] today Again, he will not take a statin

## 2013-06-13 NOTE — Progress Notes (Signed)
Subjective:    Patient ID: Lee Blevins, male    DOB: February 23, 1946, 68 y.o.   MRN: [id number]  Abdominal Pain This is a new problem. The current episode started in the past 7 days. The onset quality is gradual. The problem occurs intermittently. The most recent episode lasted 7 days. The problem has been unchanged. The pain is located in the epigastric region and LUQ. The pain is at a severity of 1/10. The pain is mild. Quality: "pressure and tightness" The abdominal pain radiates to the LUQ. Pertinent negatives include no anorexia, arthralgias, belching, constipation, diarrhea, dysuria, fever, flatus, frequency, headaches, hematochezia, hematuria, melena, myalgias, nausea, vomiting or weight loss. Nothing aggravates the pain. The pain is relieved by nothing. He has tried nothing for the symptoms. The treatment provided no relief.      Review of Systems  Constitutional: Negative.  Negative for fever, chills, weight loss, diaphoresis, appetite change and fatigue.  HENT: Negative.   Eyes: Negative.   Respiratory: Negative.   Cardiovascular: Negative.  Negative for chest pain, palpitations and leg swelling.  Gastrointestinal: Positive for abdominal pain. Negative for nausea, vomiting, diarrhea, constipation, blood in stool, melena, hematochezia, abdominal distention, anal bleeding, rectal pain, anorexia and flatus.  Endocrine: Negative.   Genitourinary: Negative.  Negative for dysuria, urgency, frequency, hematuria, flank pain and decreased urine volume.  Musculoskeletal: Negative.  Negative for arthralgias and myalgias.  Skin: Negative.   Allergic/Immunologic: Negative for environmental allergies.  Neurological: Negative.  Negative for headaches.  Hematological: Negative.  Negative for adenopathy. Does not bruise/bleed easily.  Psychiatric/Behavioral: Negative.        Objective:   Physical Exam  Vitals reviewed. Constitutional: He is oriented to person, place, and time. He appears  well-developed and well-nourished. No distress.  HENT:  Head: Normocephalic and atraumatic.  Mouth/Throat: Oropharynx is clear and moist. No oropharyngeal exudate.  Eyes: Conjunctivae are normal. Right eye exhibits no discharge. Left eye exhibits no discharge. No scleral icterus.  Neck: Normal range of motion. Neck supple. No JVD present. No tracheal deviation present. No thyromegaly present.  Cardiovascular: Normal rate, regular rhythm, normal heart sounds and intact distal pulses.  Exam reveals no gallop and no friction rub.   No murmur heard. Pulmonary/Chest: Effort normal and breath sounds normal. No stridor. No respiratory distress. He has no wheezes. He has no rales. He exhibits no tenderness.  Abdominal: Soft. Bowel sounds are normal. He exhibits no distension and no mass. There is no tenderness. There is no rebound and no guarding.  Musculoskeletal: Normal range of motion. He exhibits no edema and no tenderness.  Lymphadenopathy:    He has no cervical adenopathy.  Neurological: He is oriented to person, place, and time.  Skin: Skin is warm and dry. No rash noted. He is not diaphoretic. No erythema. No pallor.  Psychiatric: He has a normal mood and affect. His behavior is normal. Judgment and thought content normal.      Lab Results  Component Value Date   WBC 5.3 04/18/2012   HGB 14.2 04/18/2012   HCT 42.0 04/18/2012   PLT 222.0 04/18/2012   GLUCOSE 98 04/18/2012   CHOL 280* 04/18/2012   TRIG 98.0 04/18/2012   HDL 84.10 04/18/2012   LDLDIRECT 182.8 04/18/2012   LDLCALC 54 06/10/2010   ALT 14 04/18/2012   AST 23 04/18/2012   NA 137 04/18/2012   K 4.3 04/18/2012   CL 101 04/18/2012   CREATININE 0.9 04/18/2012   BUN 13 04/18/2012   CO2 27  04/18/2012   TSH 4.02 04/18/2012   PSA 0.67 04/18/2012   INR 0.95 04/10/2010      Assessment & Plan:

## 2013-06-13 NOTE — Progress Notes (Signed)
Pre visit review using our clinic review tool, if applicable. No additional management support is needed unless otherwise documented below in the visit note. 

## 2013-06-13 NOTE — Patient Instructions (Signed)
Abdominal Pain, Adult °Many things can cause abdominal pain. Usually, abdominal pain is not caused by a disease and will improve without treatment. It can often be observed and treated at home. Your health care provider will do a physical exam and possibly order blood tests and X-rays to help determine the seriousness of your pain. However, in many cases, more time must pass before a clear cause of the pain can be found. Before that point, your health care provider may not know if you need more testing or further treatment. °HOME CARE INSTRUCTIONS  °Monitor your abdominal pain for any changes. The following actions may help to alleviate any discomfort you are experiencing: °· Only take over-the-counter or prescription medicines as directed by your health care provider. °· Do not take laxatives unless directed to do so by your health care provider. °· Try a clear liquid diet (broth, tea, or water) as directed by your health care provider. Slowly move to a bland diet as tolerated. °SEEK MEDICAL CARE IF: °· You have unexplained abdominal pain. °· You have abdominal pain associated with nausea or diarrhea. °· You have pain when you urinate or have a bowel movement. °· You experience abdominal pain that wakes you in the night. °· You have abdominal pain that is worsened or improved by eating food. °· You have abdominal pain that is worsened with eating fatty foods. °SEEK IMMEDIATE MEDICAL CARE IF:  °· Your pain does not go away within 2 hours. °· You have a fever. °· You keep throwing up (vomiting). °· Your pain is felt only in portions of the abdomen, such as the right side or the left lower portion of the abdomen. °· You pass bloody or black tarry stools. °MAKE SURE YOU: °· Understand these instructions.   °· Will watch your condition.   °· Will get help right away if you are not doing well or get worse.   °Document Released: 01/11/2005 Document Revised: 01/22/2013 Document Reviewed: 12/11/2012 °[employer]® Patient  Information ©2014 [employer]. ° °

## 2013-06-13 NOTE — Assessment & Plan Note (Signed)
I think he may have GERD so will try a course of PPI (nexium) I will check his labs to look for hepatitis, pancreatitis, UTI, renal stones, infection Will also get an U/S done to see if there are gallstones or fatty liver disease

## 2013-06-18 ENCOUNTER — Ambulatory Visit
Admission: RE | Admit: 2013-06-18 | Discharge: 2013-06-18 | Disposition: A | Discharge status: Home / Self Care | Payer: Medicare Other | Source: Ambulatory Visit | Attending: Internal Medicine | Admitting: Internal Medicine

## 2013-06-18 ENCOUNTER — Encounter: Payer: Self-pay | Admitting: Internal Medicine

## 2013-06-18 DIAGNOSIS — R109 Unspecified abdominal pain: Secondary | ICD-10-CM | POA: Diagnosis not present

## 2013-06-18 DIAGNOSIS — R1013 Epigastric pain: Secondary | ICD-10-CM

## 2013-06-22 ENCOUNTER — Other Ambulatory Visit: Payer: Self-pay | Admitting: Internal Medicine

## 2013-06-30 DIAGNOSIS — J328 Other chronic sinusitis: Secondary | ICD-10-CM | POA: Diagnosis not present

## 2013-06-30 DIAGNOSIS — R439 Unspecified disturbances of smell and taste: Secondary | ICD-10-CM | POA: Diagnosis not present

## 2013-09-12 ENCOUNTER — Other Ambulatory Visit: Payer: Self-pay | Admitting: Internal Medicine

## 2013-09-25 DIAGNOSIS — J328 Other chronic sinusitis: Secondary | ICD-10-CM | POA: Diagnosis not present

## 2013-09-25 DIAGNOSIS — R439 Unspecified disturbances of smell and taste: Secondary | ICD-10-CM | POA: Diagnosis not present

## 2013-12-06 ENCOUNTER — Other Ambulatory Visit: Payer: Self-pay | Admitting: Internal Medicine

## 2013-12-16 ENCOUNTER — Ambulatory Visit (INDEPENDENT_AMBULATORY_CARE_PROVIDER_SITE_OTHER): Payer: Medicare Other | Admitting: Internal Medicine

## 2013-12-16 ENCOUNTER — Encounter: Payer: Self-pay | Admitting: Internal Medicine

## 2013-12-16 ENCOUNTER — Other Ambulatory Visit (INDEPENDENT_AMBULATORY_CARE_PROVIDER_SITE_OTHER): Payer: Medicare Other

## 2013-12-16 VITALS — BP 130/70 | HR 82 | Temp 97.8°F | Wt 196.4 lb

## 2013-12-16 DIAGNOSIS — R42 Dizziness and giddiness: Secondary | ICD-10-CM

## 2013-12-16 DIAGNOSIS — H8110 Benign paroxysmal vertigo, unspecified ear: Secondary | ICD-10-CM

## 2013-12-16 LAB — CBC WITH DIFFERENTIAL/PLATELET
BASOS PCT: 1.4 % (ref 0.0–3.0)
Basophils Absolute: 0.1 10*3/uL (ref 0.0–0.1)
Eosinophils Absolute: 0.7 10*3/uL (ref 0.0–0.7)
Eosinophils Relative: 16.1 % — ABNORMAL HIGH (ref 0.0–5.0)
HEMATOCRIT: 45.7 % (ref 39.0–52.0)
Hemoglobin: 15.3 g/dL (ref 13.0–17.0)
LYMPHS ABS: 1.5 10*3/uL (ref 0.7–4.0)
Lymphocytes Relative: 33.4 % (ref 12.0–46.0)
MCHC: 33.6 g/dL (ref 30.0–36.0)
MCV: 93.6 fl (ref 78.0–100.0)
MONO ABS: 0.5 10*3/uL (ref 0.1–1.0)
Monocytes Relative: 12.3 % — ABNORMAL HIGH (ref 3.0–12.0)
NEUTROS PCT: 36.8 % — AB (ref 43.0–77.0)
Neutro Abs: 1.6 10*3/uL (ref 1.4–7.7)
Platelets: 239 10*3/uL (ref 150.0–400.0)
RBC: 4.88 Mil/uL (ref 4.22–5.81)
RDW: 13.4 % (ref 11.5–15.5)
WBC: 4.5 10*3/uL (ref 4.0–10.5)

## 2013-12-16 LAB — BASIC METABOLIC PANEL
BUN: 12 mg/dL (ref 6–23)
CO2: 29 mEq/L (ref 19–32)
Calcium: 9.8 mg/dL (ref 8.4–10.5)
Chloride: 104 mEq/L (ref 96–112)
Creatinine, Ser: 0.9 mg/dL (ref 0.4–1.5)
GFR: 92.19 mL/min (ref >60.00)
Glucose, Bld: 112 mg/dL — ABNORMAL HIGH (ref 70–99)
POTASSIUM: 4.4 meq/L (ref 3.5–5.1)
Sodium: 140 mEq/L (ref 135–145)

## 2013-12-16 NOTE — Progress Notes (Signed)
Pre visit review using our clinic review tool, if applicable. No additional management support is needed unless otherwise documented below in the visit note. 

## 2013-12-16 NOTE — Patient Instructions (Signed)
Your next office appointment will be determined based upon review of your pending labs. Those instructions will be transmitted to you through My Chart.   Go to Web M.D. for information on benign positional vertigo (BPV) . Physical therapy exercises can treat that. Perform isometric exercise of calves  ( while seated go up on toes to count of 5 & then onto heels for 5 count). Repeat  4- 5 times prior to standing if you've been seated or supine for any significant period of time as BP drops with such positions.

## 2013-12-16 NOTE — Progress Notes (Signed)
Subjective:    Patient ID: Lee Blevins, male    DOB: [birth year], 68 y.o.   MRN: [id number]  HPI   Symptoms began 12/02/13 as lightheadedness and sensation of the room spinning. This occurred upon standing. He's also had some symptoms turning over in bed.  He has some lightheadedness with rotation of the head laterally to either direction. Also he has noted this with neck hyperextension.  Unrelated is chronic tinnitus  Intermittently will have some sinus drainage w/o symptoms of rhinosinusitis.  There is no cardiac or neurologic prodrome present.    Review of Systems  Denied were any change in heart rhythm or rate prior to the event. There was no associated chest pain or shortness of breath . Also specifically denied prior to the episode were headache, limb weakness, tingling, or numbness. No seizure activity noted. Frontal headache, facial pain , nasal purulence, dental pain, sore throat , otic pain or otic discharge denied. No fever , chills or sweats.     Objective:   Physical Exam  Positive or pertinent findings include: Slight alopecia over the crown. An umbilical hernia is noted He has significant DIP osteoarthritic changes of the  hands. Testing for benign positional vertigo and postural hypotension were negative. Specifically blood pressure was 140/70 supine and 140/70 standing with no change in pulse rate.   Gen.: Healthy and well-nourished in appearance. Alert, appropriate and cooperative throughout exam. Appears younger than stated age  Head: Normocephalic without obvious abnormalities  Eyes: No corneal or conjunctival inflammation noted. Pupils equal round reactive to light and accommodation. Extraocular motion intact. Field of Vision grossly normal. Ears: External  ear exam reveals no significant lesions or deformities. Canals clear .TMs normal. Hearing is grossly normal bilaterally.Tuning fork exam normal. Nose: External nasal exam reveals no deformity or  inflammation. Nasal mucosa are pink and moist. No lesions or exudates noted.   Mouth: Oral mucosa and oropharynx reveal no lesions or exudates. Teeth in good repair. Neck: No deformities, masses, or tenderness noted. Range of motion & Thyroid normal. Lungs: Normal respiratory effort; chest expands symmetrically. Lungs are clear to auscultation without rales, wheezes, or increased work of breathing. Heart: Normal rate and rhythm. Normal S1 and S2. No gallop, click, or rub. No murmur. Abdomen: Bowel sounds normal; abdomen soft and nontender. No masses, organomegaly or hernias noted.                                Musculoskeletal/extremities: No deformity or scoliosis noted of  the thoracic or lumbar spine. . No clubbing, cyanosis, edema, or significant extremity  deformity noted. Range of motion normal .Tone & strength normal. Fingernail  health good. Able to lie down & sit up w/o help. Negative SLR bilaterally Vascular: Carotid, radial artery, dorsalis pedis and  posterior tibial pulses are full and equal. No bruits present. Neurologic: Alert and oriented x3. Deep tendon reflexes symmetrical and normal. No cranial nerve deficit. Gait normal  including heel & toe walking . Rhomberg & finger to nose  negative    Skin: Intact without suspicious lesions or rashes. Lymph: No cervical, axillary lymphadenopathy present. Psych: Mood and affect are normal. Normally interactive  Assessment & Plan:  #1 dizziness with benign positional vertigo and postural hypotension components. Symptoms also related to lateral rotation of the head or neck hyperextension  Plan: See orders and recommendations

## 2013-12-17 ENCOUNTER — Ambulatory Visit: Payer: Medicare Other

## 2013-12-17 ENCOUNTER — Telehealth: Payer: Self-pay

## 2013-12-17 DIAGNOSIS — R7309 Other abnormal glucose: Secondary | ICD-10-CM

## 2013-12-17 LAB — HEMOGLOBIN A1C: Hgb A1c MFr Bld: 6 % (ref 4.6–6.5)

## 2013-12-17 NOTE — Telephone Encounter (Signed)
Request for add on has been faxed to lab 

## 2013-12-17 NOTE — Telephone Encounter (Signed)
Message copied by Shelly Coss on Wed Dec 17, 2013  8:29 AM ------      Message from: Hendricks Limes      Created: Tue Dec 16, 2013  6:23 PM       Please add A1c (790.29)       ------

## 2013-12-25 ENCOUNTER — Telehealth: Payer: Self-pay | Admitting: Internal Medicine

## 2013-12-25 DIAGNOSIS — J328 Other chronic sinusitis: Secondary | ICD-10-CM

## 2013-12-25 NOTE — Telephone Encounter (Signed)
Pt referral to go to Dr. Redmond Baseman (ENT doctor) for sinus problem. Please advise. Dr. Redmond Baseman number [phone].

## 2014-01-01 DIAGNOSIS — D235 Other benign neoplasm of skin of trunk: Secondary | ICD-10-CM | POA: Diagnosis not present

## 2014-01-01 DIAGNOSIS — L570 Actinic keratosis: Secondary | ICD-10-CM | POA: Diagnosis not present

## 2014-01-01 DIAGNOSIS — L819 Disorder of pigmentation, unspecified: Secondary | ICD-10-CM | POA: Diagnosis not present

## 2014-01-01 DIAGNOSIS — L821 Other seborrheic keratosis: Secondary | ICD-10-CM | POA: Diagnosis not present

## 2014-01-05 ENCOUNTER — Other Ambulatory Visit: Payer: Self-pay | Admitting: Internal Medicine

## 2014-01-05 DIAGNOSIS — R439 Unspecified disturbances of smell and taste: Secondary | ICD-10-CM | POA: Diagnosis not present

## 2014-01-05 DIAGNOSIS — J328 Other chronic sinusitis: Secondary | ICD-10-CM | POA: Diagnosis not present

## 2014-01-05 DIAGNOSIS — H8110 Benign paroxysmal vertigo, unspecified ear: Secondary | ICD-10-CM | POA: Diagnosis not present

## 2014-01-05 DIAGNOSIS — R42 Dizziness and giddiness: Secondary | ICD-10-CM | POA: Diagnosis not present

## 2014-01-08 ENCOUNTER — Ambulatory Visit: Payer: Medicare Other | Admitting: Internal Medicine

## 2014-01-13 ENCOUNTER — Ambulatory Visit: Payer: Medicare Other | Admitting: Internal Medicine

## 2014-01-13 ENCOUNTER — Encounter: Payer: Self-pay | Admitting: Internal Medicine

## 2014-01-13 VITALS — BP 146/78 | HR 63 | Ht 72.0 in | Wt 197.8 lb

## 2014-01-13 DIAGNOSIS — Z23 Encounter for immunization: Secondary | ICD-10-CM

## 2014-01-13 MED ORDER — FLUTICASONE-SALMETEROL 250-50 MCG/DOSE IN AEPB: INHALATION_SPRAY | RESPIRATORY_TRACT | Status: DC

## 2014-01-13 MED ORDER — FLUTICASONE PROPIONATE 50 MCG/ACT NA SUSP: NASAL | Status: DC

## 2014-01-13 NOTE — Patient Instructions (Signed)
Refill scripts sent for Advair and Flonase  Flu shot

## 2014-01-13 NOTE — Progress Notes (Signed)
Subjective:    Patient ID: Lee Blevins, male    DOB: [birth year], 68 y.o.   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  Has not had significant changes in breathing or nasal symptoms. He is walking 6 miles/ week and tolerating that well. He remains dependent on prednisone 5 mg daily, Advair 1 puff daily, and singulair, nasonex. Coughs up clear mucus at times. No wheeze. Doesn't have a rescue inhaler and doesn't think he needs one- never wheezes now.  11/06/11-65 yoM former smoker followed for asthma, chronic sinusitis, nasal polyps complicated by hx CVA, HBP Pt states having more problems with sinuses .states having some wheezing ,chest congestion . Has had decreased sense of smell for 6 weeks. His previous ENT doctor has retired. Persistent morning cough productive of thick sputum which clears each day. Denies shortness of breath and walks regularly. No headache or fever. Off of prednisone maintenance now for 5 months but continues Nasonex and Singulair with Advair 1 puff twice daily.  11/07/12- 78 yoM former smoker followed for asthma, chronic sinusitis, nasal polyps complicated by hx CVA, HBP FOLLOWS FOR:no SOB, wheezing, cough, or congestion out of the usual since last visit. Mild nasal congestion, taste and smell come and go. Takes daily  Baby aspirin with history of  nasal polyps. Chest okay on Advair.  01/13/14- 22 yoM former smoker followed for asthma, chronic sinusitis, nasal polyps complicated by hx CVA, HBP FOLLOWS FOR: Nasal and chest congestion at times-beige in color. Need refills on Advair and Flonase.    ROS-see HPI Constitutional:   No-   weight loss, night sweats, fevers, chills, fatigue, lassitude. HEENT:   No-  headaches, difficulty swallowing, tooth/dental problems, sore throat,       No-   sneezing, itching, ear ache, +nasal congestion, post nasal drip,  CV:  No-   chest pain, orthopnea, PND, swelling in lower extremities, anasarca, dizziness, palpitations Resp: No-   shortness of breath with exertion or at rest.             No-   productive cough,  No non-productive cough,  No- coughing up of blood.              No-   change in color of mucus.  No- wheezing.   Skin: No-   rash or lesions. GI:  No-   heartburn, indigestion, abdominal pain, nausea, vomiting,  GU:  MS:  No-   joint pain or swelling.   Neuro-     nothing unusual Psych:  No- change in mood or affect. No depression or anxiety.  No memory loss.  Objective:   Physical Exam General- Alert, Oriented, Affect-appropriate, Distress- none acute, trim Skin- rash-none, lesions- none, excoriation- none Lymphadenopathy- none Head- atraumatic            Eyes- Gross vision intact, PERRLA, conjunctivae clear secretions            Ears- Hearing, canals normal            Nose- Clear, No- Septal dev, mucus, polyp+ visible on right, no-erosion, perforation             Throat- Mallampati II-III , mucosa clear , drainage- none, tonsils- atrophic Neck- flexible , trachea midline, no stridor , thyroid nl, carotid no bruit Chest - symmetrical excursion , unlabored  Heart/CV- RRR , no murmur , no gallop  , no rub, nl s1 s2                           - JVD- none , edema- none, stasis changes- none, varices- none           Lung- clear to P&A, wheeze- none, cough- none , dullness-none, rub- none           Chest wall-  Abd-  Br/ Gen/ Rectal- Not done, not indicated Extrem- cyanosis- none, clubbing, none, atrophy- none, strength- nl Neuro- grossly intact to observation  Assessment & Plan:

## 2014-03-20 ENCOUNTER — Other Ambulatory Visit: Payer: Self-pay | Admitting: *Deleted

## 2014-03-20 MED ORDER — OLMESARTAN MEDOXOMIL 20 MG PO TABS
10.0000 mg | ORAL_TABLET | Freq: Every day | ORAL | Status: DC
Start: 2014-03-20 — End: 2014-03-25

## 2014-03-20 MED ORDER — MONTELUKAST SODIUM 10 MG PO TABS: 10.0000 mg | ORAL_TABLET | Freq: Every day | ORAL | Status: DC

## 2014-03-25 ENCOUNTER — Other Ambulatory Visit: Payer: Self-pay

## 2014-03-25 MED ORDER — MONTELUKAST SODIUM 10 MG PO TABS: 10.0000 mg | ORAL_TABLET | Freq: Every day | ORAL | Status: DC

## 2014-03-25 MED ORDER — FLUTICASONE PROPIONATE 50 MCG/ACT NA SUSP: NASAL | Status: DC

## 2014-03-25 MED ORDER — OLMESARTAN MEDOXOMIL 20 MG PO TABS: 10.0000 mg | ORAL_TABLET | Freq: Every day | ORAL | Status: DC

## 2014-03-25 MED ORDER — FLUTICASONE-SALMETEROL 250-50 MCG/DOSE IN AEPB: INHALATION_SPRAY | RESPIRATORY_TRACT | Status: DC

## 2014-03-30 ENCOUNTER — Telehealth: Payer: Self-pay | Admitting: Internal Medicine

## 2014-03-30 NOTE — Telephone Encounter (Signed)
Pharmacist has questions on Benicar; directions & quanity not matching . [facility] Pharmacist - [phone] press option 1 then  Option 2 and reference # [id number]

## 2014-03-30 NOTE — Telephone Encounter (Signed)
Spoke to pharm and clarified that the sig for Benicar is as follows: 1/2 tab daily or could write as 10 mg daily. 90 day supply is 45 tabs.

## 2014-05-28 ENCOUNTER — Ambulatory Visit (INDEPENDENT_AMBULATORY_CARE_PROVIDER_SITE_OTHER): Payer: Medicare Other | Admitting: Internal Medicine

## 2014-05-28 ENCOUNTER — Encounter: Payer: Self-pay | Admitting: Internal Medicine

## 2014-05-28 ENCOUNTER — Ambulatory Visit (INDEPENDENT_AMBULATORY_CARE_PROVIDER_SITE_OTHER)
Admission: RE | Admit: 2014-05-28 | Discharge: 2014-05-28 | Disposition: A | Discharge status: Home / Self Care | Payer: Medicare Other | Source: Ambulatory Visit | Attending: Internal Medicine | Admitting: Internal Medicine

## 2014-05-28 VITALS — BP 140/88 | HR 97 | Temp 97.8°F | Resp 16 | Wt 204.0 lb

## 2014-05-28 DIAGNOSIS — R059 Cough, unspecified: Secondary | ICD-10-CM

## 2014-05-28 DIAGNOSIS — J984 Other disorders of lung: Secondary | ICD-10-CM | POA: Diagnosis not present

## 2014-05-28 DIAGNOSIS — R05 Cough: Secondary | ICD-10-CM

## 2014-05-28 DIAGNOSIS — J209 Acute bronchitis, unspecified: Secondary | ICD-10-CM | POA: Insufficient documentation

## 2014-05-28 DIAGNOSIS — J4540 Moderate persistent asthma, uncomplicated: Secondary | ICD-10-CM

## 2014-05-28 MED ORDER — AMOXICILLIN-POT CLAVULANATE 875-125 MG PO TABS: 1.0000 | ORAL_TABLET | Freq: Two times a day (BID) | ORAL | Status: DC

## 2014-05-28 NOTE — Progress Notes (Signed)
Pre visit review using our clinic review tool, if applicable. No additional management support is needed unless otherwise documented below in the visit note. 

## 2014-05-28 NOTE — Progress Notes (Signed)
   Subjective:    Patient ID: Lee Blevins, male    DOB: November 20, 1945, 69 y.o.   MRN: [id number]  Cough This is a recurrent problem. The current episode started more than 1 month ago. The problem has been unchanged. The cough is productive of purulent sputum. Associated symptoms include chills, nasal congestion, postnasal drip and wheezing. Pertinent negatives include no chest pain, ear congestion, ear pain, fever, headaches, heartburn, hemoptysis, myalgias, rash, rhinorrhea, sore throat, shortness of breath, sweats or weight loss. He has tried OTC cough suppressant, steroid inhaler and a beta-agonist inhaler for the symptoms. The treatment provided moderate relief. His past medical history is significant for asthma.      Review of Systems  Constitutional: Positive for chills. Negative for fever, weight loss, diaphoresis and fatigue.  HENT: Positive for postnasal drip. Negative for ear pain, rhinorrhea, sinus pressure, sore throat, trouble swallowing and voice change.   Eyes: Negative.   Respiratory: Positive for cough and wheezing. Negative for hemoptysis, choking, chest tightness, shortness of breath and stridor.   Cardiovascular: Negative.  Negative for chest pain, palpitations and leg swelling.  Gastrointestinal: Negative.  Negative for heartburn, nausea, vomiting, abdominal pain, diarrhea, constipation and blood in stool.  Endocrine: Negative.   Genitourinary: Negative.   Musculoskeletal: Negative.  Negative for myalgias, back pain and arthralgias.  Skin: Negative.  Negative for rash.  Allergic/Immunologic: Negative.   Neurological: Negative.  Negative for headaches.  Hematological: Negative.  Does not bruise/bleed easily.  Psychiatric/Behavioral: Negative.        Objective:   Physical Exam  Constitutional: He is oriented to person, place, and time. He appears well-developed and well-nourished.  Non-toxic appearance. He does not have a sickly appearance. He does not appear ill. No  distress.  HENT:  Head: Normocephalic and atraumatic.  Mouth/Throat: Oropharynx is clear and moist. No oropharyngeal exudate.  Eyes: Conjunctivae are normal. Right eye exhibits no discharge. Left eye exhibits no discharge. No scleral icterus.  Neck: Normal range of motion. Neck supple. No JVD present. No tracheal deviation present. No thyromegaly present.  Cardiovascular: Normal rate, regular rhythm, normal heart sounds and intact distal pulses.  Exam reveals no gallop and no friction rub.   No murmur heard. Pulmonary/Chest: Effort normal and breath sounds normal. No stridor. No respiratory distress. He has no wheezes. He has no rales. He exhibits no tenderness.  Abdominal: Soft. Bowel sounds are normal. He exhibits no distension and no mass. There is no tenderness. There is no rebound and no guarding.  Musculoskeletal: Normal range of motion. He exhibits no edema.  Lymphadenopathy:    He has no cervical adenopathy.  Neurological: He is oriented to person, place, and time.  Skin: Skin is warm and dry. No rash noted. He is not diaphoretic. No erythema. No pallor.  Psychiatric: He has a normal mood and affect. His behavior is normal. Judgment and thought content normal.  Vitals reviewed.         Assessment & Plan:

## 2014-05-28 NOTE — Patient Instructions (Signed)

## 2014-05-31 ENCOUNTER — Encounter: Payer: Self-pay | Admitting: Internal Medicine

## 2014-05-31 NOTE — Assessment & Plan Note (Signed)
His CXR is normal - no PNA, mass, edema

## 2014-05-31 NOTE — Assessment & Plan Note (Signed)
His asthma is under good control

## 2014-05-31 NOTE — Assessment & Plan Note (Signed)
He has had a mucopurulent sputum for several months His Exam is ok and his CXR is WNL Will treat for bacterial bronchitis with augmentin

## 2014-06-24 ENCOUNTER — Ambulatory Visit (INDEPENDENT_AMBULATORY_CARE_PROVIDER_SITE_OTHER): Payer: Medicare Other | Admitting: Internal Medicine

## 2014-06-24 ENCOUNTER — Encounter: Payer: Self-pay | Admitting: Internal Medicine

## 2014-06-24 VITALS — BP 128/72 | HR 70 | Temp 97.8°F | Resp 16 | Ht 72.0 in | Wt 201.0 lb

## 2014-06-24 DIAGNOSIS — Z23 Encounter for immunization: Secondary | ICD-10-CM | POA: Diagnosis not present

## 2014-06-24 DIAGNOSIS — I10 Essential (primary) hypertension: Secondary | ICD-10-CM

## 2014-06-24 DIAGNOSIS — J4540 Moderate persistent asthma, uncomplicated: Secondary | ICD-10-CM

## 2014-06-24 NOTE — Progress Notes (Signed)
Pre visit review using our clinic review tool, if applicable. No additional management support is needed unless otherwise documented below in the visit note. 

## 2014-06-24 NOTE — Assessment & Plan Note (Signed)
Improvement noted Will cont the current meds

## 2014-06-24 NOTE — Progress Notes (Signed)
Subjective:    Patient ID: Lee Blevins, male    DOB: [birth year], 69 y.o.   MRN: [id number]  Cough This is a recurrent problem. The current episode started 1 to 4 weeks ago. The problem has been gradually improving. The problem occurs every few hours. The cough is non-productive. Pertinent negatives include no chest pain, chills, ear congestion, ear pain, fever, headaches, heartburn, hemoptysis, myalgias, nasal congestion, postnasal drip, rash, rhinorrhea, sore throat, shortness of breath, sweats, weight loss or wheezing. He has tried steroid inhaler and a beta-agonist inhaler for the symptoms. The treatment provided significant relief. His past medical history is significant for asthma and bronchitis. There is no history of bronchiectasis, COPD, emphysema, environmental allergies or pneumonia.      Review of Systems  Constitutional: Negative.  Negative for fever, chills, weight loss, diaphoresis and fatigue.  HENT: Negative.  Negative for ear pain, postnasal drip, rhinorrhea, sore throat, trouble swallowing and voice change.   Eyes: Negative.   Respiratory: Positive for cough. Negative for apnea, hemoptysis, choking, chest tightness, shortness of breath and wheezing.   Cardiovascular: Negative.  Negative for chest pain, palpitations and leg swelling.  Gastrointestinal: Negative.  Negative for heartburn and abdominal pain.  Endocrine: Negative.   Genitourinary: Negative.   Musculoskeletal: Negative.  Negative for myalgias.  Skin: Negative.  Negative for rash.  Allergic/Immunologic: Negative.  Negative for environmental allergies.  Neurological: Negative.  Negative for headaches.  Hematological: Negative.  Negative for adenopathy. Does not bruise/bleed easily.  Psychiatric/Behavioral: Negative.        Objective:   Physical Exam  Constitutional: He is oriented to person, place, and time. He appears well-developed and well-nourished. No distress.  HENT:  Head: Normocephalic and  atraumatic.  Mouth/Throat: Oropharynx is clear and moist. No oropharyngeal exudate.  Eyes: Conjunctivae are normal. Right eye exhibits no discharge. Left eye exhibits no discharge. No scleral icterus.  Neck: Normal range of motion. Neck supple. No JVD present. No tracheal deviation present. No thyromegaly present.  Cardiovascular: Normal rate, regular rhythm, normal heart sounds and intact distal pulses.  Exam reveals no gallop and no friction rub.   No murmur heard. Pulmonary/Chest: Effort normal and breath sounds normal. No stridor. No respiratory distress. He has no wheezes. He has no rales. He exhibits no tenderness.  Abdominal: Soft. Bowel sounds are normal. He exhibits no distension and no mass. There is no tenderness. There is no rebound and no guarding.  Musculoskeletal: Normal range of motion. He exhibits no edema or tenderness.  Lymphadenopathy:    He has no cervical adenopathy.  Neurological: He is oriented to person, place, and time.  Skin: Skin is warm and dry. No rash noted. He is not diaphoretic. No erythema. No pallor.  Vitals reviewed.    Lab Results  Component Value Date   WBC 4.5 12/16/2013   HGB 15.3 12/16/2013   HCT 45.7 12/16/2013   PLT 239.0 12/16/2013   GLUCOSE 112* 12/16/2013   CHOL 285* 06/13/2013   TRIG 70.0 06/13/2013   HDL 102.60 06/13/2013   LDLDIRECT 160.0 06/13/2013   LDLCALC 54 06/10/2010   ALT 15 06/13/2013   AST 21 06/13/2013   NA 140 12/16/2013   K 4.4 12/16/2013   CL 104 12/16/2013   CREATININE 0.9 12/16/2013   BUN 12 12/16/2013   CO2 29 12/16/2013   TSH 4.02 04/18/2012   PSA 0.67 04/18/2012   INR 0.95 04/10/2010   HGBA1C 6.0 12/17/2013       Assessment & Plan:

## 2014-06-24 NOTE — Patient Instructions (Signed)

## 2014-06-24 NOTE — Assessment & Plan Note (Signed)
His BP is well controlled 

## 2014-07-08 ENCOUNTER — Encounter: Payer: Self-pay | Admitting: Internal Medicine

## 2014-07-08 ENCOUNTER — Ambulatory Visit (INDEPENDENT_AMBULATORY_CARE_PROVIDER_SITE_OTHER): Payer: Medicare Other | Admitting: Internal Medicine

## 2014-07-08 VITALS — BP 128/86 | HR 69 | Temp 97.8°F | Resp 16 | Ht 72.0 in | Wt 203.0 lb

## 2014-07-08 DIAGNOSIS — J328 Other chronic sinusitis: Secondary | ICD-10-CM

## 2014-07-08 DIAGNOSIS — J4520 Mild intermittent asthma, uncomplicated: Secondary | ICD-10-CM

## 2014-07-08 DIAGNOSIS — Z1211 Encounter for screening for malignant neoplasm of colon: Secondary | ICD-10-CM | POA: Insufficient documentation

## 2014-07-08 DIAGNOSIS — I10 Essential (primary) hypertension: Secondary | ICD-10-CM

## 2014-07-08 NOTE — Assessment & Plan Note (Signed)
Improvement noted 

## 2014-07-08 NOTE — Assessment & Plan Note (Signed)
His BP is well controlled 

## 2014-07-08 NOTE — Progress Notes (Signed)
Pre visit review using our clinic review tool, if applicable. No additional management support is needed unless otherwise documented below in the visit note. 

## 2014-07-08 NOTE — Assessment & Plan Note (Signed)
He will start OTC antihistamines for this Will cont flonase

## 2014-07-08 NOTE — Patient Instructions (Signed)

## 2014-07-08 NOTE — Progress Notes (Signed)
Subjective:    Patient ID: Lee Blevins, male    DOB: 25-Oct-1945, 69 y.o.   MRN: [id number]  HPI Comments: He returns for f/up and tells me that he feels much better, his cough and wheezing have resolved, he has a mild runny nose and nasal congestion.  Hypertension Pertinent negatives include no chest pain, palpitations or shortness of breath.  Asthma There is no cough, shortness of breath or wheezing. Associated symptoms include postnasal drip and rhinorrhea. Pertinent negatives include no appetite change, chest pain, fever, sneezing, sore throat or trouble swallowing. His past medical history is significant for asthma.      Review of Systems  Constitutional: Negative.  Negative for fever, chills, diaphoresis, appetite change and fatigue.  HENT: Positive for congestion, postnasal drip and rhinorrhea. Negative for dental problem, ear discharge, nosebleeds, sinus pressure, sneezing, sore throat, tinnitus, trouble swallowing and voice change.   Eyes: Negative.   Respiratory: Negative.  Negative for cough, choking, chest tightness, shortness of breath, wheezing and stridor.   Cardiovascular: Negative.  Negative for chest pain, palpitations and leg swelling.  Gastrointestinal: Negative.  Negative for nausea, vomiting, abdominal pain, diarrhea, constipation and blood in stool.  Endocrine: Negative.   Genitourinary: Negative.   Musculoskeletal: Negative.   Skin: Negative.  Negative for rash.  Allergic/Immunologic: Negative.   Neurological: Negative.  Negative for dizziness and light-headedness.  Hematological: Negative.  Negative for adenopathy. Does not bruise/bleed easily.  Psychiatric/Behavioral: Negative.        Objective:   Physical Exam  Constitutional: He is oriented to person, place, and time. He appears well-developed and well-nourished.  Non-toxic appearance. He does not have a sickly appearance. He does not appear ill. No distress.  HENT:  Head: Normocephalic and  atraumatic.  Nose: Mucosal edema present. No rhinorrhea, nose lacerations, sinus tenderness, nasal deformity, septal deviation or nasal septal hematoma. No epistaxis.  No foreign bodies. Right sinus exhibits no maxillary sinus tenderness and no frontal sinus tenderness. Left sinus exhibits no maxillary sinus tenderness and no frontal sinus tenderness.  Mouth/Throat: Oropharynx is clear and moist and mucous membranes are normal. Mucous membranes are not pale, not dry and not cyanotic. No oral lesions. No trismus in the jaw. No uvula swelling. No oropharyngeal exudate, posterior oropharyngeal edema, posterior oropharyngeal erythema or tonsillar abscesses.  Eyes: Conjunctivae are normal. Right eye exhibits no discharge. Left eye exhibits no discharge. No scleral icterus.  Neck: Normal range of motion. Neck supple. No JVD present. No tracheal deviation present. No thyromegaly present.  Cardiovascular: Normal rate, regular rhythm, normal heart sounds and intact distal pulses.  Exam reveals no gallop and no friction rub.   No murmur heard. Pulmonary/Chest: Effort normal and breath sounds normal. No stridor. No respiratory distress. He has no wheezes. He has no rales. He exhibits no tenderness.  Abdominal: Soft. Bowel sounds are normal. He exhibits no distension and no mass. There is no tenderness. There is no rebound and no guarding.  Musculoskeletal: Normal range of motion. He exhibits no edema or tenderness.  Lymphadenopathy:    He has no cervical adenopathy.  Neurological: He is oriented to person, place, and time.  Skin: Skin is warm and dry. No rash noted. He is not diaphoretic. No erythema. No pallor.  Vitals reviewed.    Lab Results  Component Value Date   WBC 4.5 12/16/2013   HGB 15.3 12/16/2013   HCT 45.7 12/16/2013   PLT 239.0 12/16/2013   GLUCOSE 112* 12/16/2013   CHOL 285* 06/13/2013  TRIG 70.0 06/13/2013   HDL 102.60 06/13/2013   LDLDIRECT 160.0 06/13/2013   LDLCALC 54 06/10/2010    ALT 15 06/13/2013   AST 21 06/13/2013   NA 140 12/16/2013   K 4.4 12/16/2013   CL 104 12/16/2013   CREATININE 0.9 12/16/2013   BUN 12 12/16/2013   CO2 29 12/16/2013   TSH 4.02 04/18/2012   PSA 0.67 04/18/2012   INR 0.95 04/10/2010   HGBA1C 6.0 12/17/2013       Assessment & Plan:

## 2014-07-09 ENCOUNTER — Telehealth: Payer: Self-pay | Admitting: Internal Medicine

## 2014-07-09 NOTE — Telephone Encounter (Signed)
emmi emailed °

## 2014-10-15 ENCOUNTER — Ambulatory Visit (INDEPENDENT_AMBULATORY_CARE_PROVIDER_SITE_OTHER): Payer: Medicare Other | Admitting: Internal Medicine

## 2014-10-15 ENCOUNTER — Encounter: Payer: Self-pay | Admitting: Internal Medicine

## 2014-10-15 VITALS — BP 114/64 | HR 81 | Temp 97.6°F | Ht 73.0 in | Wt 194.0 lb

## 2014-10-15 DIAGNOSIS — J328 Other chronic sinusitis: Secondary | ICD-10-CM

## 2014-10-15 DIAGNOSIS — I10 Essential (primary) hypertension: Secondary | ICD-10-CM

## 2014-10-15 DIAGNOSIS — R42 Dizziness and giddiness: Secondary | ICD-10-CM

## 2014-10-15 MED ORDER — AZITHROMYCIN 250 MG PO TABS: ORAL_TABLET | ORAL | Status: DC

## 2014-10-15 NOTE — Patient Instructions (Signed)
Please take all new medication as prescribed - the antibiotic  You can also take Mucinex (or it's generic off brand) for congestion, and tylenol as needed for pain.  You can hold on taking the Benicar 10 mg for 3 days, or until the BP and dizziness are improved  Please continue all other medications as before, and refills have been done if requested.  Please have the pharmacy call with any other refills you may need.  Please keep your appointments with your specialists as you may have planned

## 2014-10-15 NOTE — Progress Notes (Signed)
Subjective:    Patient ID: Lee Blevins, male    DOB: [birth year], 69 y.o.   MRN: [id number]  HPI   Here with 2-3 wks acute onset fever, facial discomfort, pressure, general weakness and malaise, and greenish d/c, without ST and cough or other pain, but pt denies chest pain, wheezing, increased sob or doe, orthopnea, PND, increased LE swelling, palpitations, dizziness or syncope.  Has also felt off balance, but no falls.  Pt denies new neurological symptoms such as new headache, or facial or extremity weakness or numbness + hx of CVA Past Medical History  Diagnosis Date  . Asthma   . Pneumonia   . Chronic sinusitis   . Nasal polyps   . Hyperlipidemia   . HTN (hypertension)   . History of CVA (cerebrovascular accident)    Past Surgical History  Procedure Laterality Date  . Nasal polyp excision      x2  . Tonsillectomy      reports that he quit smoking about 41 years ago. His smoking use included Cigarettes. He has never used smokeless tobacco. He reports that he drinks about 2.4 oz of alcohol per week. He reports that he does not use illicit drugs. family history includes Heart disease in his father. There is no history of Cancer, Early death, Hearing loss, Hyperlipidemia, Hypertension, Stroke, or Kidney disease. Allergies  Allergen Reactions  . Amlodipine     edema  . Crestor [Rosuvastatin]     Muscle aches  . Livalo [Pitavastatin]     Muscle aches  . Bystolic [Nebivolol Hcl]     dizziness   Current Outpatient Prescriptions on File Prior to Visit  Medication Sig Dispense Refill  . fluticasone (FLONASE) 50 MCG/ACT nasal spray USE 1 SPRAY IN EACH NOSTRIL TWICE DAILY 48 g 3  . Fluticasone-Salmeterol (ADVAIR DISKUS) 250-50 MCG/DOSE AEPB Inhale 1 puff then rinse mouth, twice daily 180 each 3  . montelukast (SINGULAIR) 10 MG tablet Take 1 tablet (10 mg total) by mouth at bedtime. 90 tablet 3  . olmesartan (BENICAR) 20 MG tablet Take 0.5 tablets (10 mg total) by mouth daily. 90  tablet 2   No current facility-administered medications on file prior to visit.   Review of Systems  Constitutional: Negative for unusual diaphoresis or night sweats HENT: Negative for ringing in ear or discharge Eyes: Negative for double vision or worsening visual disturbance.  Respiratory: Negative for choking and stridor.   Gastrointestinal: Negative for vomiting or other signifcant bowel change Genitourinary: Negative for hematuria or change in urine volume.  Musculoskeletal: Negative for other MSK pain or swelling Skin: Negative for color change and worsening wound.  Neurological: Negative for tremors and numbness other than noted  Psychiatric/Behavioral: Negative for decreased concentration or agitation other than above       Objective:   Physical Exam BP 114/64 mmHg  Pulse 81  Temp(Src) 97.6 F (36.4 C) (Oral)  Ht 6\' 1"  (1.854 m)  Wt 194 lb (87.998 kg)  BMI 25.60 kg/m2  SpO2 95% VS noted, mild ill appearing Constitutional: Pt appears in no significant distress HENT: Head: NCAT.  Right Ear: External ear normal.  Left Ear: External ear normal.  Bilat tm's with mild right erythema.  Max sinus areas mild tender.  Pharynx with mild erythema, no exudate  Eyes: . Pupils are equal, round, and reactive to light. Conjunctivae and EOM are normal Neck: Normal range of motion. Neck supple.  Cardiovascular: Normal rate and regular rhythm.   Pulmonary/Chest: Effort normal and  breath sounds without rales or wheezing.  Neurological: Pt is alert. Not confused , motor grossly intact, gait intact, cn 2-12 intact Skin: Skin is warm. No rash, no LE edema Psychiatric: Pt behavior is normal. No agitation.     Assessment & Plan:

## 2014-10-15 NOTE — Assessment & Plan Note (Signed)
With some dizziness, not clear but BP borderline low, ok to hold the benicar for 3 days, and monitor closely

## 2014-10-15 NOTE — Assessment & Plan Note (Signed)
Also for mucinex for right side eustach valve symtpoms, and ok to hold the benicar 10 mg for 3 days due to lower bp

## 2014-10-15 NOTE — Assessment & Plan Note (Signed)
With flare - for zpack x 1, cont singulair, flonase asd,  to f/u any worsening symptoms or concerns

## 2014-12-28 ENCOUNTER — Telehealth: Payer: Self-pay | Admitting: Internal Medicine

## 2014-12-28 NOTE — Telephone Encounter (Signed)
Called and spoke with pt's wife Informed of possible appt time on Wednesday at 11:30am Pt's wife stated that this time did not work and requested a Thursday appt Pt scheduled to see CY on 12-31-14 at 4:15pm  Nothing further is needed

## 2014-12-28 NOTE — Telephone Encounter (Signed)
Called spoke with spouse. Pt has pending appt 01/14/15 but wanted to see if could be seen this week by Dr. Annamaria Boots. Pt is coughing a lot(thick clear phlem-yellow) and is feeling very fatigue. No f/c/s/n/v, no PND, no nasal cong, no wheezing, no chest tx. Pt currently not on any ABX's. Please advise Dr. Annamaria Boots thanks  Allergies  Allergen Reactions  . Amlodipine     edema  . Crestor [Rosuvastatin]     Muscle aches  . Livalo [Pitavastatin]     Muscle aches  . Bystolic [Nebivolol Hcl]     dizziness     Current Outpatient Prescriptions on File Prior to Visit  Medication Sig Dispense Refill  . azithromycin (ZITHROMAX Z-PAK) 250 MG tablet Use as directed 6 tablet 1  . fluticasone (FLONASE) 50 MCG/ACT nasal spray USE 1 SPRAY IN EACH NOSTRIL TWICE DAILY 48 g 3  . Fluticasone-Salmeterol (ADVAIR DISKUS) 250-50 MCG/DOSE AEPB Inhale 1 puff then rinse mouth, twice daily 180 each 3  . montelukast (SINGULAIR) 10 MG tablet Take 1 tablet (10 mg total) by mouth at bedtime. 90 tablet 3  . olmesartan (BENICAR) 20 MG tablet Take 0.5 tablets (10 mg total) by mouth daily. 90 tablet 2   No current facility-administered medications on file prior to visit.

## 2014-12-28 NOTE — Telephone Encounter (Signed)
Lee Blevins- please see what you can do with the schedule

## 2014-12-28 NOTE — Telephone Encounter (Signed)
Is TP available to see him this week?

## 2014-12-28 NOTE — Telephone Encounter (Signed)
Please add patient to Wed 12-30-14 at 11:30am schedule with CY. Thanks.

## 2014-12-28 NOTE — Telephone Encounter (Signed)
Tp is out this week.

## 2014-12-31 ENCOUNTER — Ambulatory Visit (INDEPENDENT_AMBULATORY_CARE_PROVIDER_SITE_OTHER): Payer: Medicare Other | Admitting: Internal Medicine

## 2014-12-31 ENCOUNTER — Encounter: Payer: Self-pay | Admitting: Internal Medicine

## 2014-12-31 VITALS — BP 140/78 | HR 87 | Ht 72.0 in | Wt 185.8 lb

## 2014-12-31 DIAGNOSIS — J330 Polyp of nasal cavity: Secondary | ICD-10-CM

## 2014-12-31 DIAGNOSIS — Z23 Encounter for immunization: Secondary | ICD-10-CM

## 2014-12-31 DIAGNOSIS — J4520 Mild intermittent asthma, uncomplicated: Secondary | ICD-10-CM

## 2014-12-31 DIAGNOSIS — J328 Other chronic sinusitis: Secondary | ICD-10-CM | POA: Diagnosis not present

## 2014-12-31 MED ORDER — PREDNISONE 10 MG PO TABS: ORAL_TABLET | ORAL | Status: DC

## 2014-12-31 NOTE — Patient Instructions (Signed)
Script for prednisone sent  Flu vax

## 2014-12-31 NOTE — Progress Notes (Signed)
Subjective:    Patient ID: Lee Blevins, male    DOB: [birth year], 69 y.o.   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  Has not had significant changes in breathing or nasal symptoms. He is walking 6 miles/ week and tolerating that well. He remains dependent on prednisone 5 mg daily, Advair 1 puff daily, and singulair, nasonex. Coughs up clear mucus at times. No wheeze. Doesn't have a rescue inhaler and doesn't think he needs one- never wheezes now.  11/06/11-69 yoM former smoker followed for asthma, chronic sinusitis, nasal polyps complicated by hx CVA, HBP Pt states having more problems with sinuses .states having some wheezing ,chest congestion . Has had decreased sense of smell for 6 weeks. His previous ENT doctor has retired. Persistent morning cough productive of thick sputum which clears each day. Denies shortness of breath and walks regularly. No headache or fever. Off of prednisone maintenance now for 5 months but continues Nasonex and Singulair with Advair 1 puff twice daily.  11/07/12- 69 yoM former smoker followed for asthma, chronic sinusitis, nasal polyps complicated by hx CVA, HBP FOLLOWS FOR:no SOB, wheezing, cough, or congestion out of the usual since last visit. Mild nasal congestion, taste and smell come and go. Takes daily  Baby aspirin with history of  nasal polyps. Chest okay on Advair.  01/13/14- 69 yoM former smoker followed for asthma, chronic sinusitis, nasal polyps complicated by hx CVA, HBP FOLLOWS FOR: Nasal and chest congestion at times-beige in color. Need refills on Advair and Flonase. Acute: Follows For: pt states he been having troubles with sinus and proiductive cough yellowish in color since about june and hast been any better. pt states hes felt a little weaker and fatigued than  normal. pt states he has lost his smell and taste over the past 3 weeks.  Saw Dr. Johnnette Gourd 3 weeks ago and was given prednisone.  ROS-see HPI Constitutional:   No-   weight loss, night sweats, fevers, chills, fatigue, lassitude. HEENT:   No-  headaches, difficulty swallowing, tooth/dental problems, sore throat,       No-  sneezing, itching, ear ache, +nasal congestion, post nasal drip,  CV:  No-   chest pain, orthopnea, PND, swelling in lower extremities, anasarca, dizziness, palpitations Resp: No-   shortness of breath with exertion or at rest.             No-   productive cough,  No non-productive cough,  No- coughing up of blood.              No-   change in color of mucus.  No- wheezing.   Skin: No-   rash or lesions. GI:  No-   heartburn, indigestion, abdominal pain, nausea, vomiting,  GU:  MS:  No-   joint pain or swelling.   Neuro-     nothing unusual Psych:  No- change in mood or affect. No depression or anxiety.  No memory loss.  Objective:   Physical Exam General- Alert, Oriented, Affect-appropriate, Distress- none acute, trim Skin- rash-none, lesions- none, excoriation- none Lymphadenopathy- none Head- atraumatic            Eyes- Gross vision intact, PERRLA, conjunctivae clear secretions            Ears- Hearing, canals normal  Nose- Clear, No- Septal dev, mucus +, polyp-none seen, no-erosion, perforation             Throat- Mallampati II-III , mucosa clear , drainage- none, tonsils- atrophic Neck- flexible , trachea midline, no stridor , thyroid nl, carotid no bruit Chest - symmetrical excursion , unlabored           Heart/CV- RRR , no murmur , no gallop  , no rub, nl s1 s2                           - JVD- none , edema- none, stasis changes- none, varices- none           Lung- clear to P&A, wheeze- none, cough- none , dullness-none, rub- none           Chest wall-  Abd-  Br/ Gen/ Rectal- Not done, not indicated Extrem- cyanosis- none, clubbing, none,  atrophy- none, strength- nl Neuro- grossly intact to observation  Assessment & Plan:

## 2015-01-02 NOTE — Assessment & Plan Note (Signed)
Mild intermittent uncomplicated well controlled at this time

## 2015-01-02 NOTE — Assessment & Plan Note (Signed)
Now following with Dr. Johnnette Gourd

## 2015-01-02 NOTE — Assessment & Plan Note (Signed)
No polyps visible anteriorly at this visit after recent course of prednisone but he sounds stuffy during speech Plan-prednisone taper to hold, flu vaccine with discussion

## 2015-01-14 ENCOUNTER — Ambulatory Visit: Payer: Medicare Other | Admitting: Internal Medicine

## 2015-01-14 DIAGNOSIS — D1801 Hemangioma of skin and subcutaneous tissue: Secondary | ICD-10-CM | POA: Diagnosis not present

## 2015-01-14 DIAGNOSIS — L570 Actinic keratosis: Secondary | ICD-10-CM | POA: Diagnosis not present

## 2015-01-14 DIAGNOSIS — L814 Other melanin hyperpigmentation: Secondary | ICD-10-CM | POA: Diagnosis not present

## 2015-01-14 DIAGNOSIS — L820 Inflamed seborrheic keratosis: Secondary | ICD-10-CM | POA: Diagnosis not present

## 2015-01-14 DIAGNOSIS — D225 Melanocytic nevi of trunk: Secondary | ICD-10-CM | POA: Diagnosis not present

## 2015-01-14 DIAGNOSIS — L821 Other seborrheic keratosis: Secondary | ICD-10-CM | POA: Diagnosis not present

## 2015-01-17 ENCOUNTER — Other Ambulatory Visit: Payer: Self-pay | Admitting: Internal Medicine

## 2015-02-17 ENCOUNTER — Telehealth: Payer: Self-pay | Admitting: Internal Medicine

## 2015-02-17 MED ORDER — PREDNISONE 10 MG PO TABS: ORAL_TABLET | ORAL | Status: DC

## 2015-02-17 NOTE — Telephone Encounter (Signed)
Per MR give refill of last pred taper given last by CY.

## 2015-02-17 NOTE — Telephone Encounter (Signed)
Spoke with pt, states he is having sinus trouble- c/o sinus congestion, runny nose, PND Xseveral days.  States he cannot smell or taste which is the most bothersome. Requesting prednisone to help with this.  [facility] [address]   Sending to doc of the day as CY is gone for the day.  MR please advise.  Thanks!

## 2015-02-17 NOTE — Telephone Encounter (Signed)
Rx sent and patient aware-pharmacy confirmed prior to sending Rx.

## 2015-03-10 ENCOUNTER — Other Ambulatory Visit: Payer: Self-pay | Admitting: Internal Medicine

## 2015-03-10 ENCOUNTER — Telehealth: Payer: Self-pay | Admitting: Internal Medicine

## 2015-03-10 MED ORDER — PREDNISONE 10 MG PO TABS: ORAL_TABLET | ORAL | Status: DC

## 2015-03-10 NOTE — Telephone Encounter (Signed)
Called and spoke to pt. Pt c/o decrease in taste and smell recently. Pt states he has had this issue before and prednisone is the only thing that helps. Pt denies sinus pressure, HA, f/c/s, cough, PND, SOB, CP/tightness.   Dr. Annamaria Boots please advise. Thanks.   Allergies  Allergen Reactions  . Amlodipine     edema  . Crestor [Rosuvastatin]     Muscle aches  . Livalo [Pitavastatin]     Muscle aches  . Bystolic [Nebivolol Hcl]     dizziness    Current Outpatient Prescriptions on File Prior to Visit  Medication Sig Dispense Refill  . ADVAIR DISKUS 250-50 MCG/DOSE AEPB Use 1 inhalation two times  daily then rinse mouth 180 each 1  . fluticasone (FLONASE) 50 MCG/ACT nasal spray USE 1 SPRAY IN EACH NOSTRIL TWICE DAILY 48 g 3  . montelukast (SINGULAIR) 10 MG tablet Take 1 tablet by mouth at  bedtime 90 tablet 1  . olmesartan (BENICAR) 20 MG tablet Take 0.5 tablets (10 mg total) by mouth daily. 90 tablet 2   No current facility-administered medications on file prior to visit.

## 2015-03-12 NOTE — Telephone Encounter (Signed)
Nothing further is needed at this time.  

## 2015-03-12 NOTE — Telephone Encounter (Signed)
CY please advise  

## 2015-03-12 NOTE — Telephone Encounter (Signed)
Given pred script on 11/23 when he came by.

## 2015-04-06 ENCOUNTER — Telehealth: Payer: Self-pay | Admitting: Internal Medicine

## 2015-04-06 MED ORDER — PREDNISONE 10 MG PO TABS: ORAL_TABLET | ORAL | Status: DC

## 2015-04-06 NOTE — Telephone Encounter (Signed)
Ok to Rx prednisone 10 mg, # 20, 4 X 2 DAYS, 3 X 2 DAYS, 2 X 2 DAYS, 1 X 2 DAYS    May refill x 3

## 2015-04-06 NOTE — Telephone Encounter (Signed)
Rx sent to pharmacy. Called and spoke with patient's wife, she stated that she will let patient know [facility]. Nothing further needed.

## 2015-04-06 NOTE — Telephone Encounter (Signed)
Spoke with pt, c/o sinus congestion, "can't taste or smell" X1 month.  Pt states that per CY when this happens to call our office for a pred taper, which helps to resolve these symptoms.  Pt denies fever,chest congestion, nausea, vomiting.   [facility] [address]    Last ov: 12/31/14 Next ov: 12/31/15  CY please advise.  Thanks!  Allergies  Allergen Reactions  . Amlodipine     edema  . Crestor [Rosuvastatin]     Muscle aches  . Livalo [Pitavastatin]     Muscle aches  . Bystolic [Nebivolol Hcl]     dizziness   Current Outpatient Prescriptions on File Prior to Visit  Medication Sig Dispense Refill  . ADVAIR DISKUS 250-50 MCG/DOSE AEPB Use 1 inhalation two times  daily then rinse mouth 180 each 1  . fluticasone (FLONASE) 50 MCG/ACT nasal spray USE 1 SPRAY IN EACH NOSTRIL TWICE DAILY 48 g 3  . montelukast (SINGULAIR) 10 MG tablet Take 1 tablet by mouth at  bedtime 90 tablet 1  . olmesartan (BENICAR) 20 MG tablet Take 0.5 tablets (10 mg total) by mouth daily. 90 tablet 2  . predniSONE (DELTASONE) 10 MG tablet 4 X 2 DAYS, 3 X 2 DAYS, 2 X 2 DAYS, 1 X 2 DAYS 20 tablet 0   No current facility-administered medications on file prior to visit.

## 2015-04-27 ENCOUNTER — Other Ambulatory Visit: Payer: Self-pay | Admitting: Internal Medicine

## 2015-06-08 ENCOUNTER — Encounter: Payer: Self-pay | Admitting: Internal Medicine

## 2015-06-08 ENCOUNTER — Other Ambulatory Visit (INDEPENDENT_AMBULATORY_CARE_PROVIDER_SITE_OTHER): Payer: Medicare Other

## 2015-06-08 ENCOUNTER — Ambulatory Visit (INDEPENDENT_AMBULATORY_CARE_PROVIDER_SITE_OTHER): Payer: Medicare Other | Admitting: Internal Medicine

## 2015-06-08 VITALS — BP 144/84 | HR 74 | Temp 97.8°F | Resp 16 | Ht 72.0 in | Wt 186.0 lb

## 2015-06-08 DIAGNOSIS — R739 Hyperglycemia, unspecified: Secondary | ICD-10-CM | POA: Diagnosis not present

## 2015-06-08 DIAGNOSIS — R351 Nocturia: Secondary | ICD-10-CM

## 2015-06-08 DIAGNOSIS — I10 Essential (primary) hypertension: Secondary | ICD-10-CM

## 2015-06-08 DIAGNOSIS — N401 Enlarged prostate with lower urinary tract symptoms: Secondary | ICD-10-CM

## 2015-06-08 DIAGNOSIS — E785 Hyperlipidemia, unspecified: Secondary | ICD-10-CM

## 2015-06-08 DIAGNOSIS — J988 Other specified respiratory disorders: Secondary | ICD-10-CM

## 2015-06-08 DIAGNOSIS — J22 Unspecified acute lower respiratory infection: Secondary | ICD-10-CM | POA: Insufficient documentation

## 2015-06-08 LAB — HEMOGLOBIN A1C: HEMOGLOBIN A1C: 5.5 % (ref 4.6–6.5)

## 2015-06-08 LAB — LIPID PANEL
CHOLESTEROL: 235 mg/dL — AB (ref 0–200)
HDL: 89.5 mg/dL (ref >39.00)
LDL Cholesterol: 131 mg/dL — ABNORMAL HIGH (ref 0–99)
NonHDL: 145.85
Total CHOL/HDL Ratio: 3
Triglycerides: 75 mg/dL (ref 0.0–149.0)
VLDL: 15 mg/dL (ref 0.0–40.0)

## 2015-06-08 LAB — BASIC METABOLIC PANEL
BUN: 14 mg/dL (ref 6–23)
CHLORIDE: 103 meq/L (ref 96–112)
CO2: 28 mEq/L (ref 19–32)
CREATININE: 0.87 mg/dL (ref 0.40–1.50)
Calcium: 9.6 mg/dL (ref 8.4–10.5)
GFR: 91.8 mL/min (ref >60.00)
Glucose, Bld: 90 mg/dL (ref 70–99)
POTASSIUM: 3.7 meq/L (ref 3.5–5.1)
Sodium: 138 mEq/L (ref 135–145)

## 2015-06-08 LAB — PSA: PSA: 0.66 ng/mL (ref 0.10–4.00)

## 2015-06-08 LAB — TSH: TSH: 1.86 u[IU]/mL (ref 0.35–4.50)

## 2015-06-08 MED ORDER — AMOXICILLIN-POT CLAVULANATE 875-125 MG PO TABS: 1.0000 | ORAL_TABLET | Freq: Two times a day (BID) | ORAL | Status: DC

## 2015-06-08 NOTE — Progress Notes (Signed)
Pre visit review using our clinic review tool, if applicable. No additional management support is needed unless otherwise documented below in the visit note. 

## 2015-06-08 NOTE — Progress Notes (Signed)
Subjective:  Patient ID: Lee Blevins, male    DOB: [birth year]  Age: 70 y.o. MRN: [id number]  CC: Cough; Hyperlipidemia; and Hypertension   HPI Lee Blevins presents for a one-month history of cough that is productive of globules of yellow/page phlegm. He has had a few episodes of shortness of breath. He denies fever, chills, night sweats, chest pain, or hemoptysis. He has taken over-the-counter cough suppressants with some relief.  He is also due for follow-up on hypertension. His blood pressure at home is been well controlled on the ARB.  Outpatient Prescriptions Prior to Visit  Medication Sig Dispense Refill  . ADVAIR DISKUS 250-50 MCG/DOSE AEPB Use 1 inhalation two times  daily then rinse mouth 180 each 1  . BENICAR 20 MG tablet Take one-half tablet by  mouth daily 45 tablet 2  . fluticasone (FLONASE) 50 MCG/ACT nasal spray USE 1 SPRAY IN EACH NOSTRIL TWICE DAILY 48 g 3  . montelukast (SINGULAIR) 10 MG tablet Take 1 tablet by mouth at  bedtime 90 tablet 1  . predniSONE (DELTASONE) 10 MG tablet 4 X 2 DAYS, 3 X 2 DAYS, 2 X 2 DAYS, 1 X 2 DAYS 20 tablet 0  . predniSONE (DELTASONE) 10 MG tablet Take 40mg  x 2 days with food, 30mg  x 2, 20mg  x 2, 10mg  x 2 20 tablet 3   No facility-administered medications prior to visit.    ROS Review of Systems  Constitutional: Negative.  Negative for fever, chills, diaphoresis, appetite change and fatigue.  HENT: Negative.  Negative for congestion, sinus pressure, sore throat and trouble swallowing.   Eyes: Negative.   Respiratory: Positive for cough and shortness of breath. Negative for apnea, choking, chest tightness, wheezing and stridor.   Cardiovascular: Negative.  Negative for chest pain, palpitations and leg swelling.  Gastrointestinal: Negative.  Negative for nausea, vomiting, abdominal pain, diarrhea and constipation.  Endocrine: Negative.   Genitourinary: Negative.   Musculoskeletal: Negative.  Negative for myalgias and back pain.  Skin:  Negative.   Allergic/Immunologic: Negative.   Neurological: Negative.  Negative for dizziness, weakness, light-headedness and headaches.  Hematological: Negative for adenopathy. Does not bruise/bleed easily.  Psychiatric/Behavioral: Negative.     Objective:  BP 144/84 mmHg  Pulse 74  Temp(Src) 97.8 F (36.6 C) (Oral)  Resp 16  Ht 6' (1.829 m)  Wt 186 lb (84.369 kg)  BMI 25.22 kg/m2  SpO2 94%  BP Readings from Last 3 Encounters:  06/08/15 144/84  12/31/14 140/78  10/15/14 114/64    Wt Readings from Last 3 Encounters:  06/08/15 186 lb (84.369 kg)  12/31/14 185 lb 12.8 oz (84.278 kg)  10/15/14 194 lb (87.998 kg)    Physical Exam  Constitutional: He is oriented to person, place, and time.  Non-toxic appearance. He does not have a sickly appearance. He does not appear ill. No distress.  HENT:  Mouth/Throat: Oropharynx is clear and moist. No oropharyngeal exudate.  Eyes: Conjunctivae are normal. Right eye exhibits no discharge. Left eye exhibits no discharge. No scleral icterus.  Neck: Normal range of motion. Neck supple. No JVD present. No tracheal deviation present. No thyromegaly present.  Cardiovascular: Normal rate, regular rhythm, normal heart sounds and intact distal pulses.  Exam reveals no gallop and no friction rub.   No murmur heard. Pulmonary/Chest: Effort normal and breath sounds normal. No stridor. No respiratory distress. He has no wheezes. He has no rales. He exhibits no tenderness.  Abdominal: Soft. Bowel sounds are normal. He exhibits no distension and  no mass. There is no tenderness. There is no rebound and no guarding.  Musculoskeletal: Normal range of motion. He exhibits no edema or tenderness.  Lymphadenopathy:    He has no cervical adenopathy.  Neurological: He is oriented to person, place, and time.  Skin: Skin is warm and dry. No rash noted. He is not diaphoretic. No erythema. No pallor.  Vitals reviewed.   Lab Results  Component Value Date   WBC  4.5 12/16/2013   HGB 15.3 12/16/2013   HCT 45.7 12/16/2013   PLT 239.0 12/16/2013   GLUCOSE 90 06/08/2015   CHOL 235* 06/08/2015   TRIG 75.0 06/08/2015   HDL 89.50 06/08/2015   LDLDIRECT 160.0 06/13/2013   LDLCALC 131* 06/08/2015   ALT 15 06/13/2013   AST 21 06/13/2013   NA 138 06/08/2015   K 3.7 06/08/2015   CL 103 06/08/2015   CREATININE 0.87 06/08/2015   BUN 14 06/08/2015   CO2 28 06/08/2015   TSH 1.86 06/08/2015   PSA 0.66 06/08/2015   INR 0.95 04/10/2010   HGBA1C 5.5 06/08/2015    Dg Chest 2 View  05/29/2014  CLINICAL DATA:  Two month history of congestion ; history of asthma and previous episodes of pneumonia. EXAM: CHEST  2 VIEW COMPARISON:  PA and lateral chest x-ray of August 05, 2010 FINDINGS: The lungs are hyperinflated with hemidiaphragm flattening. The interstitial markings are coarse at the right lung base but stable. The cardiac silhouette is normal in size. The pulmonary vascularity is normal. There is no pleural effusion. The bony thorax exhibits no acute abnormality. IMPRESSION: Mild hyperinflation consistent with reactive airway disease. There is no pneumonia nor other active cardiopulmonary disease. Electronically Signed   By: Kalib  Martinique   On: 05/29/2014 08:57    Assessment & Plan:   Stanislaus was seen today for cough, hyperlipidemia and hypertension.  Diagnoses and all orders for this visit:  Essential hypertension- his blood pressure is adequately well controlled, electrolytes and renal function are stable -     Basic metabolic panel; Future  BPH associated with nocturia -     PSA; Future  Hyperlipidemia with target LDL less than 70- his LDL is a little bit too high but he is also not willing to start a statin. -     TSH; Future -     Lipid panel; Future  Hyperglycemia- he has very mild prediabetes, he will continue to work on his lifestyle modifications. -     Basic metabolic panel; Future -     Hemoglobin A1c; Future  LRTI (lower respiratory tract  infection)- I will treat the infection with Augmentin -     Discontinue: amoxicillin-clavulanate (AUGMENTIN) 875-125 MG tablet; Take 1 tablet by mouth 2 (two) times daily.  I have discontinued Mr. Tardif's predniSONE and predniSONE. I am also having him maintain his fluticasone, ADVAIR DISKUS, montelukast, and BENICAR.  Meds ordered this encounter  Medications  . DISCONTD: amoxicillin-clavulanate (AUGMENTIN) 875-125 MG tablet    Sig: Take 1 tablet by mouth 2 (two) times daily.    Dispense:  20 tablet    Refill:  0     Follow-up: Return in about 3 weeks (around 06/29/2015).  Scarlette Calico, MD

## 2015-06-08 NOTE — Patient Instructions (Signed)

## 2015-06-09 ENCOUNTER — Telehealth: Payer: Self-pay | Admitting: Internal Medicine

## 2015-06-09 DIAGNOSIS — J22 Unspecified acute lower respiratory infection: Secondary | ICD-10-CM

## 2015-06-09 MED ORDER — AMOXICILLIN-POT CLAVULANATE 875-125 MG PO TABS: 1.0000 | ORAL_TABLET | Freq: Two times a day (BID) | ORAL | Status: AC

## 2015-06-09 NOTE — Telephone Encounter (Signed)
DONE

## 2015-06-09 NOTE — Telephone Encounter (Signed)
Amoxicillin was sent to mail order.  [facility] [address].

## 2015-06-09 NOTE — Telephone Encounter (Signed)
Patient called back to follow up on getting the prescription [facility] to [facility] [address]

## 2015-06-10 NOTE — Telephone Encounter (Signed)
Pt wife is called back and in and

## 2015-08-18 ENCOUNTER — Encounter: Payer: Self-pay | Admitting: Internal Medicine

## 2015-08-18 ENCOUNTER — Ambulatory Visit (INDEPENDENT_AMBULATORY_CARE_PROVIDER_SITE_OTHER): Payer: Medicare Other | Admitting: Internal Medicine

## 2015-08-18 VITALS — BP 132/80 | HR 94 | Temp 97.5°F | Resp 20 | Wt 181.0 lb

## 2015-08-18 DIAGNOSIS — I10 Essential (primary) hypertension: Secondary | ICD-10-CM

## 2015-08-18 DIAGNOSIS — J328 Other chronic sinusitis: Secondary | ICD-10-CM

## 2015-08-18 DIAGNOSIS — J4520 Mild intermittent asthma, uncomplicated: Secondary | ICD-10-CM | POA: Diagnosis not present

## 2015-08-18 MED ORDER — BUDESONIDE-FORMOTEROL FUMARATE 160-4.5 MCG/ACT IN AERO: 2.0000 | INHALATION_SPRAY | Freq: Two times a day (BID) | RESPIRATORY_TRACT | Status: DC

## 2015-08-18 MED ORDER — ALBUTEROL SULFATE HFA 108 (90 BASE) MCG/ACT IN AERS: 2.0000 | INHALATION_SPRAY | Freq: Four times a day (QID) | RESPIRATORY_TRACT | Status: DC | PRN

## 2015-08-18 MED ORDER — PREDNISONE 10 MG PO TABS: ORAL_TABLET | ORAL | Status: DC

## 2015-08-18 NOTE — Progress Notes (Signed)
Pre visit review using our clinic review tool, if applicable. No additional management support is needed unless otherwise documented below in the visit note. 

## 2015-08-18 NOTE — Patient Instructions (Signed)
You had the steroid shot today  OK to stop the advair  Please take all new medication as prescribed - the prednisone, symbicort, and also the albuterol inhaler if needed  Please continue all other medications as before, and refills have been done if requested.  Please have the pharmacy call with any other refills you may need.  Please keep your appointments with your specialists as you may have planned

## 2015-08-18 NOTE — Progress Notes (Signed)
Subjective:    Patient ID: Lee Blevins, male    DOB: 1945/08/03, 70 y.o.   MRN: [id number]  HPI  Here with acute onset mild to mod 2-3 days increased sob/doe with mild wheezing, but no ST, HA, general weakness and malaise, or prod cough,  but Pt denies chest pain, orthopnea, PND, increased LE swelling, palpitations, dizziness or syncope.  Overall good compliance with current meds.  Pt denies new neurological symptoms such as new headache, or facial or extremity weakness or numbness   Pt denies polydipsia, polyuria. Also Does have several wks ongoing nasal allergy symptoms with clearish congestion, itch and sneezing, without fever, pain, ST, cough, swelling or wheezing..  Ran out of meds x several wks Past Medical History  Diagnosis Date  . Asthma   . Pneumonia   . Chronic sinusitis   . Nasal polyps   . Hyperlipidemia   . HTN (hypertension)   . History of CVA (cerebrovascular accident)    Past Surgical History  Procedure Laterality Date  . Nasal polyp excision      x2  . Tonsillectomy      reports that he quit smoking about 42 years ago. His smoking use included Cigarettes. He has never used smokeless tobacco. He reports that he drinks about 2.4 oz of alcohol per week. He reports that he does not use illicit drugs. family history includes Heart disease in his father. There is no history of Cancer, Early death, Hearing loss, Hyperlipidemia, Hypertension, Stroke, or Kidney disease. Allergies  Allergen Reactions  . Amlodipine     edema  . Crestor [Rosuvastatin]     Muscle aches  . Livalo [Pitavastatin]     Muscle aches  . Bystolic [Nebivolol Hcl]     dizziness   Current Outpatient Prescriptions on File Prior to Visit  Medication Sig Dispense Refill  . BENICAR 20 MG tablet Take one-half tablet by  mouth daily 45 tablet 2  . fluticasone (FLONASE) 50 MCG/ACT nasal spray USE 1 SPRAY IN EACH NOSTRIL TWICE DAILY 48 g 3  . montelukast (SINGULAIR) 10 MG tablet Take 1 tablet by mouth at   bedtime 90 tablet 1   No current facility-administered medications on file prior to visit.   Review of Systems  Constitutional: Negative for unusual diaphoresis or night sweats HENT: Negative for ear swelling or discharge Eyes: Negative for worsening visual haziness  Respiratory: Negative for choking and stridor.   Gastrointestinal: Negative for distension or worsening eructation Genitourinary: Negative for retention or change in urine volume.  Musculoskeletal: Negative for other MSK pain or swelling Skin: Negative for color change and worsening wound Neurological: Negative for tremors and numbness other than noted  Psychiatric/Behavioral: Negative for decreased concentration or agitation other than above       Objective:   Physical Exam BP 132/80 mmHg  Pulse 94  Temp(Src) 97.5 F (36.4 C) (Oral)  Resp 20  Wt 181 lb (82.101 kg)  SpO2 94% VS noted, not ill appearing Constitutional: Pt appears in no apparent distress HENT: Head: NCAT.  Right Ear: External ear normal.  Left Ear: External ear normal.  Bilat tm's with mild erythema.  Max sinus areas non tender.  Pharynx with mild erythema, no exudate Eyes: . Pupils are equal, round, and reactive to light. Conjunctivae and EOM are normal Neck: Normal range of motion. Neck supple.  Cardiovascular: Normal rate and regular rhythm.   Pulmonary/Chest: Effort normal and breath sounds decreased without rales but with mild bilat few scattered wheezing.  Neurological: Pt is alert. Not confused , motor grossly intact Skin: Skin is warm. No rash, no LE edema Psychiatric: Pt behavior is normal. No agitation.     Assessment & Plan:

## 2015-08-21 ENCOUNTER — Other Ambulatory Visit: Payer: Self-pay | Admitting: Internal Medicine

## 2015-08-22 NOTE — Assessment & Plan Note (Signed)
stable overall by history and exam, recent data reviewed with pt, and pt to continue medical treatment as before,  to f/u any worsening symptoms or concerns BP Readings from Last 3 Encounters:  08/18/15 132/80  06/08/15 144/84  12/31/14 140/78

## 2015-08-22 NOTE — Assessment & Plan Note (Signed)
Should improve with predpac, also to re-start flonase, singulair,  to f/u any worsening symptoms or concerns

## 2015-08-22 NOTE — Assessment & Plan Note (Signed)
With mild flare, for depomedrol IM, predpac asd, change advair to symbicort, cont all other meds,  to f/u any worsening symptoms or concerns

## 2015-08-23 NOTE — Telephone Encounter (Signed)
08-18-15 OV with Dr Jenny Reichmann        Patient Instructions     You had the steroid shot today  OK to stop the advair  Please take all new medication as prescribed - the prednisone, symbicort, and also the albuterol inhaler if needed  Please continue all other medications as before, and refills have been done if requested.  Please have the pharmacy call with any other refills you may need.  Please keep your appointments with your specialists as you may have planned     CY Please advise. Thanks.

## 2015-08-24 NOTE — Telephone Encounter (Signed)
Ok to refill x 1 year 

## 2015-09-06 ENCOUNTER — Other Ambulatory Visit: Payer: Medicare Other

## 2015-09-06 ENCOUNTER — Ambulatory Visit (INDEPENDENT_AMBULATORY_CARE_PROVIDER_SITE_OTHER)
Admission: RE | Admit: 2015-09-06 | Discharge: 2015-09-06 | Disposition: A | Discharge status: Home / Self Care | Payer: Medicare Other | Source: Ambulatory Visit | Attending: Internal Medicine | Admitting: Internal Medicine

## 2015-09-06 ENCOUNTER — Ambulatory Visit (INDEPENDENT_AMBULATORY_CARE_PROVIDER_SITE_OTHER): Payer: Medicare Other | Admitting: Internal Medicine

## 2015-09-06 ENCOUNTER — Encounter: Payer: Self-pay | Admitting: Internal Medicine

## 2015-09-06 VITALS — BP 148/78 | HR 92 | Ht 73.0 in | Wt 180.0 lb

## 2015-09-06 DIAGNOSIS — R0989 Other specified symptoms and signs involving the circulatory and respiratory systems: Secondary | ICD-10-CM | POA: Diagnosis not present

## 2015-09-06 DIAGNOSIS — J441 Chronic obstructive pulmonary disease with (acute) exacerbation: Secondary | ICD-10-CM

## 2015-09-06 DIAGNOSIS — J328 Other chronic sinusitis: Secondary | ICD-10-CM

## 2015-09-06 MED ORDER — GLYCOPYRROLATE-FORMOTEROL 9-4.8 MCG/ACT IN AERO: 2.0000 | INHALATION_SPRAY | Freq: Two times a day (BID) | RESPIRATORY_TRACT | Status: DC

## 2015-09-06 MED ORDER — FLUTTER DEVI: Status: DC

## 2015-09-06 NOTE — Progress Notes (Signed)
Patient ID: Lee Blevins, male   DOB: [birth year], 70 y.o.   MRN: [id number] Patient seen in the office today and instructed on use of Bevespi.  Patient expressed understanding and demonstrated technique.

## 2015-09-06 NOTE — Patient Instructions (Addendum)
Order-  CXR     Dx exacerbation chronic bronchitis  Script printed Flutter device     Blow through 4 times per set, three sets per day,   To help clear out mucus  Order- Sputum C&S- routine, fungal, AFB    Sample Bevespi maintenance inhaler     Inhale 2 puffs, twice daily every day. Try this instead of Symbicort or Advair. When the sample runs out, go back to your Advair  1 puff then rinse mouth, twice daily.  See if you notice any difference while on Bevespi

## 2015-09-06 NOTE — Progress Notes (Signed)
Subjective:    Patient ID: Lee Blevins, male    DOB: 17-Jan-1946, 70 y.o.   MRN: [id number]  HPI 11/03/10- followed for asthma, chronic sinusitis, nasal polyps complicated by hx CVA, HBP Last here November 04, 2009- note reviewed He had a hemorrhagic CVA 04/10/10- hosp at [facility]- right arm weak- resolved.  Has not had significant changes in breathing or nasal symptoms. He is walking 6 miles/ week and tolerating that well. He remains dependent on prednisone 5 mg daily, Advair 1 puff daily, and singulair, nasonex. Coughs up clear mucus at times. No wheeze. Doesn't have a rescue inhaler and doesn't think he needs one- never wheezes now.  11/06/11-65 yoM former smoker followed for asthma, chronic sinusitis, nasal polyps complicated by hx CVA, HBP Pt states having more problems with sinuses .states having some wheezing ,chest congestion . Has had decreased sense of smell for 6 weeks. His previous ENT doctor has retired. Persistent morning cough productive of thick sputum which clears each day. Denies shortness of breath and walks regularly. No headache or fever. Off of prednisone maintenance now for 5 months but continues Nasonex and Singulair with Advair 1 puff twice daily.  11/07/12- 27 yoM former smoker followed for asthma, chronic sinusitis, nasal polyps complicated by hx CVA, HBP FOLLOWS FOR:no SOB, wheezing, cough, or congestion out of the usual since last visit. Mild nasal congestion, taste and smell come and go. Takes daily  Baby aspirin with history of  nasal polyps. Chest okay on Advair.  01/13/14- 50 yoM former smoker followed for asthma, chronic sinusitis, nasal polyps complicated by hx CVA, HBP FOLLOWS FOR: Nasal and chest congestion at times-beige in color. Need refills on Advair and Flonase. Acute: Follows For: pt states he been having troubles with sinus and proiductive cough yellowish in color since about june and hast been any better. pt states hes felt a little weaker and fatigued than  normal. pt states he has lost his smell and taste over the past 3 weeks.  Saw Dr. [name] 3 weeks ago and was given prednisone.  09/06/2015-70 year old male former smoker followed for asthma, chronic sinusitis, nasal polyps complicated by history CVA, HBP FOLLOWS FOR: pt c/o chest congestion, prod cough brownish in color, occ [id number]  Increased chest congestion and cough for the past month or so. Frequent brown sputum with no blood, fever, chills or sweats. No recent antibiotics. No prednisone in over 2 months. Use of albuterol rescue inhaler. Rhinosinusitis has been doing very much better. Sense of smell has returned. Denies heartburn or reflux. Dr. [name] tried changing Advair to Symbicort CXR 05/28/2014- IMPRESSION: Mild hyperinflation consistent with reactive airway disease. There is no pneumonia nor other active cardiopulmonary disease. ellectronically Signed  By: Jadis Martinique  On: 05/29/2014 08:57  ROS-see HPI Constitutional:   No-   weight loss, night sweats, fevers, chills, fatigue, lassitude. HEENT:   No-  headaches, difficulty swallowing, tooth/dental problems, sore throat,       No-  sneezing, itching, ear ache, +nasal congestion, post nasal drip,  CV:  No-   chest pain, orthopnea, PND, swelling in lower extremities, anasarca, dizziness, palpitations Resp: No-   shortness of breath with exertion or at rest.             + productive cough,  No non-productive cough,  No- coughing up of blood.              +  change in color of mucus.  No- wheezing.   Skin: No-   rash  or lesions. GI:  No-   heartburn, indigestion, abdominal pain, nausea, vomiting,  GU:  MS:  No-   joint pain or swelling.   Neuro-     nothing unusual Psych:  No- change in mood or affect. No depression or anxiety.  No memory loss.  Objective:   Physical Exam General- Alert, Oriented, Affect-appropriate, Distress- none acute, trim Skin- rash-none, lesions- none, excoriation- none Lymphadenopathy-  none Head- atraumatic            Eyes- Gross vision intact, PERRLA, conjunctivae clear secretions            Ears- Hearing, canals normal            Nose- Clear, No- Septal dev, mucus +, polyp-none seen, no-erosion, perforation             Throat- Mallampati II-III , mucosa clear , drainage- none, tonsils- atrophic Neck- flexible , trachea midline, no stridor , thyroid nl, carotid no bruit Chest - symmetrical excursion , unlabored           Heart/CV- RRR , no murmur , no gallop  , no rub, nl s1 s2                           - JVD- none , edema- none, stasis changes- none, varices- none           Lung- clear to P&A, wheeze- none, cough- none , dullness-none, rub- none           Chest wall-  Abd-  Br/ Gen/ Rectal- Not done, not indicated Extrem- cyanosis- none, clubbing, none, atrophy- none, strength- nl Neuro- grossly intact to observation  Assessment & Plan:

## 2015-09-07 ENCOUNTER — Telehealth: Payer: Self-pay | Admitting: Internal Medicine

## 2015-09-07 NOTE — Telephone Encounter (Signed)
CXR- Radiologist notes what may be a small lung nodule. Since former smoker, he recommends CT chest to look closer at this.   Order CT chest with contrast for dx lung nodule , lab for BMET

## 2015-09-07 NOTE — Telephone Encounter (Signed)
lmtcb X1 for pt to review results/recs.  Will order below lab/ct after speaking to pt.

## 2015-09-07 NOTE — Telephone Encounter (Signed)
Received a call report from Opal Sidles at [facility]. Pt's CXR showed a vague nodular density in the left lung base.  Radiologist is recommending a noncontrast CT for further evaluation.

## 2015-09-09 NOTE — Telephone Encounter (Signed)
LMTCB for the pt and will forward to Katie to f/u on

## 2015-09-10 LAB — RESPIRATORY CULTURE OR RESPIRATORY AND SPUTUM CULTURE

## 2015-09-10 NOTE — Telephone Encounter (Signed)
LMTCB

## 2015-09-14 ENCOUNTER — Telehealth: Payer: Self-pay | Admitting: Internal Medicine

## 2015-09-14 DIAGNOSIS — R911 Solitary pulmonary nodule: Secondary | ICD-10-CM

## 2015-09-14 MED ORDER — CIPROFLOXACIN HCL 500 MG PO TABS: 500.0000 mg | ORAL_TABLET | Freq: Two times a day (BID) | ORAL | Status: DC

## 2015-09-14 NOTE — Telephone Encounter (Signed)
Patient called returning our call -prm  °

## 2015-09-14 NOTE — Telephone Encounter (Signed)
Lee Lever, MD at 09/07/2015 4:36 PM     Status: Signed       Expand All Collapse All   CXR- Radiologist notes what may be a small lung nodule. Since former smoker, he recommends CT chest to look closer at this.   Order CT chest with contrast for dx lung nodule , lab for BMET

## 2015-09-14 NOTE — Telephone Encounter (Signed)
Please see phone note dated 09-14-15 for further conversations with patient.

## 2015-09-14 NOTE — Telephone Encounter (Signed)
Final cultures will take 6 weeks. From the routine bacterial culture, he is growing a resistant bacterium called Pseudomonas.   Recommend order Cipro 500 mg, # 20, 1 twice daily

## 2015-09-14 NOTE — Telephone Encounter (Signed)
Results have been explained to patient, pt expressed understanding.  CT order placed with Contrast- pt aware that once he gets his appt for the CT made he will need to come by to have labs drawn prior to CT appt.  BMET placed to have prior to scan.  Pt is also asking about his Sputum results as well. Please advise Dr Annamaria Boots. Thanks.

## 2015-09-14 NOTE — Telephone Encounter (Signed)
Pr returning call call.Lee Blevins

## 2015-09-14 NOTE — Telephone Encounter (Signed)
Called spoke with pt and his wife. Reviewed results and recs. Verified pharmacy as [facility] on [address]. Pt voiced understanding and had no further questions. Rx sent. Nothing further needed.

## 2015-09-20 ENCOUNTER — Telehealth: Payer: Self-pay | Admitting: Internal Medicine

## 2015-09-20 ENCOUNTER — Other Ambulatory Visit (INDEPENDENT_AMBULATORY_CARE_PROVIDER_SITE_OTHER): Payer: Medicare Other

## 2015-09-20 DIAGNOSIS — R911 Solitary pulmonary nodule: Secondary | ICD-10-CM

## 2015-09-20 LAB — BASIC METABOLIC PANEL
BUN: 11 mg/dL (ref 6–23)
CHLORIDE: 101 meq/L (ref 96–112)
CO2: 29 mEq/L (ref 19–32)
Calcium: 9.9 mg/dL (ref 8.4–10.5)
Creatinine, Ser: 0.87 mg/dL (ref 0.40–1.50)
GFR: 91.72 mL/min (ref >60.00)
GLUCOSE: 96 mg/dL (ref 70–99)
POTASSIUM: 4.2 meq/L (ref 3.5–5.1)
Sodium: 136 mEq/L (ref 135–145)

## 2015-09-20 NOTE — Telephone Encounter (Signed)
Spoke with pt in lobby. He came in with his wife. PT WAS NOT IN DISTRESS. He came by to have his pulse ox compared with ours. Our pulse ox registered at 91% on room air. His pulse ox registered at 90% on room air. States that he had been having some DOE at times. Again, while pt was here in the office he was NOT IN DISTRESS. He only wanted to check his oxygen level with our pulse ox. Advised him that he continues to have DOE, he is to call us so that we may see him for an appointment. He declined an appointment for today. Nothing further was needed at this time.

## 2015-09-23 ENCOUNTER — Ambulatory Visit (INDEPENDENT_AMBULATORY_CARE_PROVIDER_SITE_OTHER)
Admission: RE | Admit: 2015-09-23 | Discharge: 2015-09-23 | Disposition: A | Discharge status: Home / Self Care | Payer: Medicare Other | Source: Ambulatory Visit | Attending: Internal Medicine | Admitting: Internal Medicine

## 2015-09-23 DIAGNOSIS — R911 Solitary pulmonary nodule: Secondary | ICD-10-CM | POA: Diagnosis not present

## 2015-09-23 DIAGNOSIS — R918 Other nonspecific abnormal finding of lung field: Secondary | ICD-10-CM | POA: Diagnosis not present

## 2015-09-23 MED ORDER — IOPAMIDOL (ISOVUE-300) INJECTION 61%
80.0000 mL | Freq: Once | INTRAVENOUS | Status: AC | PRN
  Administered 2015-09-23: 80 mL via INTRAVENOUS

## 2015-09-24 ENCOUNTER — Encounter: Payer: Self-pay | Admitting: Internal Medicine

## 2015-09-24 ENCOUNTER — Other Ambulatory Visit (INDEPENDENT_AMBULATORY_CARE_PROVIDER_SITE_OTHER): Payer: Medicare Other

## 2015-09-24 ENCOUNTER — Other Ambulatory Visit: Payer: Self-pay | Admitting: Internal Medicine

## 2015-09-24 ENCOUNTER — Telehealth: Payer: Self-pay | Admitting: Internal Medicine

## 2015-09-24 ENCOUNTER — Ambulatory Visit (INDEPENDENT_AMBULATORY_CARE_PROVIDER_SITE_OTHER): Payer: Medicare Other | Admitting: Internal Medicine

## 2015-09-24 VITALS — BP 128/60 | HR 92 | Temp 97.7°F | Ht 73.0 in | Wt 176.0 lb

## 2015-09-24 DIAGNOSIS — K7469 Other cirrhosis of liver: Secondary | ICD-10-CM | POA: Diagnosis not present

## 2015-09-24 DIAGNOSIS — J328 Other chronic sinusitis: Secondary | ICD-10-CM | POA: Diagnosis not present

## 2015-09-24 DIAGNOSIS — J479 Bronchiectasis, uncomplicated: Secondary | ICD-10-CM

## 2015-09-24 DIAGNOSIS — K7460 Unspecified cirrhosis of liver: Secondary | ICD-10-CM | POA: Insufficient documentation

## 2015-09-24 DIAGNOSIS — J471 Bronchiectasis with (acute) exacerbation: Secondary | ICD-10-CM | POA: Diagnosis not present

## 2015-09-24 DIAGNOSIS — J4520 Mild intermittent asthma, uncomplicated: Secondary | ICD-10-CM | POA: Diagnosis not present

## 2015-09-24 DIAGNOSIS — I2510 Atherosclerotic heart disease of native coronary artery without angina pectoris: Secondary | ICD-10-CM | POA: Insufficient documentation

## 2015-09-24 LAB — CBC WITH DIFFERENTIAL/PLATELET
BASOS ABS: 0.1 10*3/uL (ref 0.0–0.1)
Basophils Relative: 0.9 % (ref 0.0–3.0)
EOS ABS: 1.1 10*3/uL — AB (ref 0.0–0.7)
Eosinophils Relative: 15.9 % — ABNORMAL HIGH (ref 0.0–5.0)
HCT: 52 % (ref 39.0–52.0)
Hemoglobin: 17.5 g/dL — ABNORMAL HIGH (ref 13.0–17.0)
Lymphocytes Relative: 16.1 % (ref 12.0–46.0)
Lymphs Abs: 1.1 10*3/uL (ref 0.7–4.0)
MCHC: 33.7 g/dL (ref 30.0–36.0)
MCV: 95.5 fl (ref 78.0–100.0)
MONO ABS: 0.7 10*3/uL (ref 0.1–1.0)
Monocytes Relative: 10.4 % (ref 3.0–12.0)
NEUTROS PCT: 56.7 % (ref 43.0–77.0)
Neutro Abs: 3.8 10*3/uL (ref 1.4–7.7)
PLATELETS: 256 10*3/uL (ref 150.0–400.0)
RBC: 5.45 Mil/uL (ref 4.22–5.81)
RDW: 12.8 % (ref 11.5–15.5)
WBC: 6.7 10*3/uL (ref 4.0–10.5)

## 2015-09-24 LAB — HEPATIC FUNCTION PANEL
ALK PHOS: 88 U/L (ref 39–117)
ALT: 11 U/L (ref 0–53)
AST: 21 U/L (ref 0–37)
Albumin: 4.3 g/dL (ref 3.5–5.2)
BILIRUBIN TOTAL: 0.9 mg/dL (ref 0.2–1.2)
Bilirubin, Direct: 0.2 mg/dL (ref 0.0–0.3)
Total Protein: 8.2 g/dL (ref 6.0–8.3)

## 2015-09-24 MED ORDER — CIPROFLOXACIN HCL 750 MG PO TABS: 750.0000 mg | ORAL_TABLET | Freq: Two times a day (BID) | ORAL | Status: DC

## 2015-09-24 NOTE — Assessment & Plan Note (Signed)
He denies known history of liver disease and admits to only one glass of wine a day at most. Plan-hepatic enzyme panel, a1antitrypsin

## 2015-09-24 NOTE — Assessment & Plan Note (Signed)
Remains quiet with no recent acute symptoms no recent request for prednisone. 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

## 2015-09-24 NOTE — Telephone Encounter (Signed)
Spoke with Lee Blevins at [place]. She wanted to double check with [place] about the ONO order that was placed. Verified with her that per CY >> ONO on room air. Nothing further was needed.

## 2015-09-24 NOTE — Assessment & Plan Note (Addendum)
Pattern is beginning to favor chronic bronchitis growing Pseudomonas with fungal and AFB final results not back. Some bronchiectasis and nodularity on CT suggests possibility of an atypical infection. CT chest from yesterday showed small patches of bronchopneumonia. Image may be lagging clinical status. He doesn't feel a lot better. We reviewed antibiotic sensitivity panel and I recommend adding another week of Cipro at higher dose. So far no GI distress. Plan-extend Cipro. Note oxygen desaturation on walking test today. Overnight oximetry on room air.

## 2015-09-24 NOTE — Patient Instructions (Addendum)
Script sent for Cipro  Order- lab- a1AT assay, hepatic panel, CBC w diff, Quantiferon TB assay   Dx bronchiectasis  Order- ONOX on room air     Dx bronchiectasis  Please call as needed  Order future CXR on return in 2 weeks    Dx bronchopneumonia

## 2015-09-24 NOTE — Assessment & Plan Note (Signed)
No angina or CHF identified. Coronary insufficiency is not evaluated by this imaging. Finding is documented.

## 2015-09-24 NOTE — Progress Notes (Signed)
Subjective:    Patient ID: Lee Blevins, male    DOB: 07/31/1945, 70 y.o.   MRN: [id number]  HPI 11/03/10- followed for asthma, chronic sinusitis, nasal polyps complicated by hx CVA, HBP Last here November 04, 2009- note reviewed He had a hemorrhagic CVA 04/10/10- hosp at [facility]- right arm weak- resolved.  Has not had significant changes in breathing or nasal symptoms. He is walking 6 miles/ week and tolerating that well. He remains dependent on prednisone 5 mg daily, Advair 1 puff daily, and singulair, nasonex. Coughs up clear mucus at times. No wheeze. Doesn't have a rescue inhaler and doesn't think he needs one- never wheezes now.  11/06/11-65 yoM former smoker followed for asthma, chronic sinusitis, nasal polyps complicated by hx CVA, HBP Pt states having more problems with sinuses .states having some wheezing ,chest congestion . Has had decreased sense of smell for 6 weeks. His previous ENT doctor has retired. Persistent morning cough productive of thick sputum which clears each day. Denies shortness of breath and walks regularly. No headache or fever. Off of prednisone maintenance now for 5 months but continues Nasonex and Singulair with Advair 1 puff twice daily.  11/07/12- 58 yoM former smoker followed for asthma, chronic sinusitis, nasal polyps complicated by hx CVA, HBP FOLLOWS FOR:no SOB, wheezing, cough, or congestion out of the usual since last visit. Mild nasal congestion, taste and smell come and go. Takes daily  Baby aspirin with history of  nasal polyps. Chest okay on Advair.  01/13/14- 51 yoM former smoker followed for asthma, chronic sinusitis, nasal polyps complicated by hx CVA, HBP FOLLOWS FOR: Nasal and chest congestion at times-beige in color. Need refills on Advair and Flonase. Acute: Follows For: pt states he been having troubles with sinus and proiductive cough yellowish in color since about june and hast been any better. pt states hes felt a little weaker and fatigued than  normal. pt states he has lost his smell and taste over the past 3 weeks.  Saw Dr. Johnnette Gourd 3 weeks ago and was given prednisone.  09/06/2015-70 year old male former smoker followed for asthma, chronic sinusitis, nasal polyps complicated by history CVA, HBP FOLLOWS FOR: pt c/o chest congestion, prod cough brownish in color, occ wheezingX3-67mo Increased chest congestion and cough for the past month or so. Frequent brown sputum with no blood, fever, chills or sweats. No recent antibiotics. No prednisone in over 2 months. Use of albuterol rescue inhaler. Rhinosinusitis has been doing very much better. Sense of smell has returned. Denies heartburn or reflux. Dr. [name] tried changing Advair to Symbicort CXR 05/28/2014- IMPRESSION: Mild hyperinflation consistent with reactive airway disease. There is no pneumonia nor other active cardiopulmonary disease. ellectronically Signed  By: Patty Martinique  On: 05/29/2014 08:57  09/24/2015-70 year old male former smoker followed for asthma, chronic sinusitis, nasal polyps, comp Katie by history CVA, HBP ACUTE VISIT: SOB with exertion; "nothing makes better" Walk test on room air 09/24/15-dropped to 86%, recovery on O2 2 L; resting room air 90% Sputum culture 09/06/2015-POS Pseudomonas>> Cipro 500 mg twice daily 10 days begun 09/14/2015 Wife here this visit. Together they have described cough productive brown sputum, malaise, some increased shortness of breath 2 months. No acute event. No fever, sweat, blood, adenopathy. No reflux event. Still little use of rescue inhaler-not a lot of wheeze He is now completing 10 days of Cipro 500 mg twice a day. Wife thinks he is coughing less. He still denies any recent flare of his perennial rhinosinusitis no prednisone in over 2  months.. CT chest 09/23/2015- I reviewed images independently. Discussed findings with patient IMPRESSION: 1. The appearance the chest is most compatible with severe bronchitis with  multilobar bronchopneumonia, most evident in the right lower lobe and to a lesser extent in the right upper lobe. 2. In addition, there are few scattered pulmonary nodules measuring 5 mm or less in size. No follow-up needed if patient is low-risk (and has no known or suspected primary neoplasm). Non-contrast chest CT can be considered in 12 months if patient is high-risk. This recommendation follows the consensus statement: Guidelines for Management of Incidental Pulmonary Nodules Detected on CT Images:From the Fleischner Society 2017; published online before print (10.1148/radiol.SG:5268862). 3. Atherosclerosis, including left main and 3 vessel coronary artery disease. Assessment for potential risk factor modification, dietary therapy or pharmacologic therapy may be warranted, if clinically indicated. 4. Morphologic changes in the liver suggestive of early cirrhosis. Electronically Signed  By: Vinnie Langton M.D.  On: 09/23/2015 15:41  ROS-see HPI Constitutional:   No-   weight loss, night sweats, fevers, chills, fatigue, lassitude. HEENT:   No-  headaches, difficulty swallowing, tooth/dental problems, sore throat,       No-  sneezing, itching, ear ache, +nasal congestion, post nasal drip,  CV:  No-   chest pain, orthopnea, PND, swelling in lower extremities, anasarca, dizziness, palpitations Resp: No-   shortness of breath with exertion or at rest.             No-   productive cough,  No non-productive cough,  No- coughing up of blood.              No-   change in color of mucus.  No- wheezing.   Skin: No-   rash or lesions. GI:  No-   heartburn, indigestion, abdominal pain, nausea, vomiting,  GU:  MS:  No-   joint pain or swelling.   Neuro-     nothing unusual Psych:  No- change in mood or affect. No depression or anxiety.  No memory loss.  Objective:   Physical Exam General- Alert, Oriented, Affect-appropriate, Distress- none acute, trim Skin- rash-none, lesions- none,  excoriation- none Lymphadenopathy- none Head- atraumatic            Eyes- Gross vision intact, PERRLA, conjunctivae clear secretions            Ears- Hearing, canals normal            Nose- Clear, No- Septal dev, mucus +, polyp-none seen, no-erosion, perforation             Throat- Mallampati II-III , mucosa clear , drainage- none, tonsils- atrophic Neck- flexible , trachea midline, no stridor , thyroid nl, carotid no bruit Chest - symmetrical excursion , unlabored           Heart/CV- RRR , no murmur , no gallop  , no rub, nl s1 s2                           - JVD- none , edema- none, stasis changes- none, varices- none           Lung- clear to P&A, wheeze- none, cough- none , dullness-none, rub- none           Chest wall-  Abd-  Br/ Gen/ Rectal- Not done, not indicated Extrem- cyanosis- none, clubbing, none, atrophy- none, strength- nl Neuro- grossly intact to observation  Assessment & Plan:

## 2015-09-24 NOTE — Assessment & Plan Note (Addendum)
Persistent bronchitis syndrome without much wheezing over the past month or so. He has been off of prednisone and sinus disease has not been active. Not clear if it is related to the cool wet spring or pollen season. Plan-Flutter device, chest x-ray, sputum cultures, sample trial Bevespi, then return to Advair which he prefers.

## 2015-09-24 NOTE — Assessment & Plan Note (Signed)
Much less symptomatic and sense of smell has returned.

## 2015-09-25 LAB — QUANTIFERON TB GOLD ASSAY (BLOOD)
INTERFERON GAMMA RELEASE ASSAY: NEGATIVE
Mitogen-Nil: 1.74 IU/mL
Quantiferon Nil Value: 0.03 IU/mL
Quantiferon Tb Ag Minus Nil Value: 0.01 IU/mL

## 2015-09-27 ENCOUNTER — Telehealth: Payer: Self-pay | Admitting: Internal Medicine

## 2015-09-27 NOTE — Telephone Encounter (Signed)
I called [facility] & spoke with Estill Bamberg.  She verified she is working on [employer] and patient should hear from them later this week.  I called and spoke with patient.  I advised Order was received late Friday afternoon so they were working on getting it entered in their system.  I advised that if he did not hear from [place] by 10/01/15 to contact them or to let us know and we would be glad to f/u on it again.  Pt voiced understanding, nothing further needed.

## 2015-09-27 NOTE — Telephone Encounter (Signed)
Spoke with the pt and his spouse  He is calling b/c ONO not yet scheduled  They called [facility] and were advised they never received an order  [facility], please advise thanks

## 2015-09-28 LAB — ALPHA-1 ANTITRYPSIN PHENOTYPE: A-1 Antitrypsin: 199 mg/dL (ref 83–199)

## 2015-09-30 ENCOUNTER — Encounter: Payer: Self-pay | Admitting: Internal Medicine

## 2015-09-30 ENCOUNTER — Other Ambulatory Visit: Payer: Self-pay | Admitting: Internal Medicine

## 2015-09-30 ENCOUNTER — Telehealth: Payer: Self-pay | Admitting: Internal Medicine

## 2015-09-30 DIAGNOSIS — J9620 Acute and chronic respiratory failure, unspecified whether with hypoxia or hypercapnia: Secondary | ICD-10-CM

## 2015-09-30 MED ORDER — SULFAMETHOXAZOLE-TRIMETHOPRIM 800-160 MG PO TABS
1.0000 | ORAL_TABLET | Freq: Two times a day (BID) | ORAL | Status: DC
Start: 2015-09-30 — End: 2015-10-25

## 2015-09-30 NOTE — Telephone Encounter (Signed)
CY please advise if you have this ONO. Thanks.

## 2015-09-30 NOTE — Telephone Encounter (Signed)
ONOX shows that his oxygen is staying low enough at night to qualify for sleep O2  2L for dx acute on chronic hypoxic respiratory failure

## 2015-09-30 NOTE — Telephone Encounter (Signed)
Mandy dropped this off and I put it on your cart for review

## 2015-09-30 NOTE — Telephone Encounter (Signed)
Mandy came by to drop off report, i have placed this in CY folder up front to be passed out at end of today/.

## 2015-09-30 NOTE — Telephone Encounter (Signed)
Spoke with pt and notified of results per Dr. Annamaria Boots. Pt verbalized understanding and denied any questions. Order sent to [facility] for o2  Pt states he is to finish Cipro today and his cough is still prod with brown sputum  He reports his breathing has also not improved, and needs another abx  No fever  Please advise, thanks!  Allergies  Allergen Reactions  . Amlodipine     edema  . Crestor [Rosuvastatin]     Muscle aches  . Livalo [Pitavastatin]     Muscle aches  . Bystolic [Nebivolol Hcl]     dizziness   Current Outpatient Prescriptions on File Prior to Visit  Medication Sig Dispense Refill  . albuterol (PROVENTIL HFA;VENTOLIN HFA) 108 (90 Base) MCG/ACT inhaler Inhale 2 puffs into the lungs every 6 (six) hours as needed for wheezing or shortness of breath. 1 Inhaler 11  . BENICAR 20 MG tablet Take one-half tablet by  mouth daily 45 tablet 2  . BENICAR 20 MG tablet TAKE 1 TABLET BY MOUTH EVERY DAY 90 tablet 1  . ciprofloxacin (CIPRO) 750 MG tablet Take 1 tablet (750 mg total) by mouth 2 (two) times daily. 14 tablet 0  . fluticasone (FLONASE) 50 MCG/ACT nasal spray USE 1 SPRAY IN EACH NOSTRIL TWICE DAILY 48 g 3  . Fluticasone-Salmeterol (ADVAIR) 250-50 MCG/DOSE AEPB Inhale 1 puff into the lungs 2 (two) times daily.    . montelukast (SINGULAIR) 10 MG tablet Take 1 tablet by mouth at  bedtime 90 tablet 1  . Respiratory Therapy Supplies (FLUTTER) DEVI Blow through 4 times per set, Three sets per day 1 each 0   No current facility-administered medications on file prior to visit.

## 2015-09-30 NOTE — Telephone Encounter (Signed)
Offer Bactrim DS # 20, 1 twice daily

## 2015-09-30 NOTE — Telephone Encounter (Signed)
I don't see it. Please request from DME

## 2015-09-30 NOTE — Telephone Encounter (Signed)
Spoke with pt, aware of recs.  rx sent to preferred pharmacy.  Nothing further needed.  

## 2015-10-08 ENCOUNTER — Ambulatory Visit (INDEPENDENT_AMBULATORY_CARE_PROVIDER_SITE_OTHER)
Admission: RE | Admit: 2015-10-08 | Discharge: 2015-10-08 | Disposition: A | Discharge status: Home / Self Care | Payer: Medicare Other | Source: Ambulatory Visit | Attending: Internal Medicine | Admitting: Internal Medicine

## 2015-10-08 ENCOUNTER — Telehealth: Payer: Self-pay | Admitting: Internal Medicine

## 2015-10-08 ENCOUNTER — Encounter: Payer: Self-pay | Admitting: Internal Medicine

## 2015-10-08 ENCOUNTER — Ambulatory Visit (INDEPENDENT_AMBULATORY_CARE_PROVIDER_SITE_OTHER): Payer: Medicare Other | Admitting: Internal Medicine

## 2015-10-08 ENCOUNTER — Other Ambulatory Visit: Payer: Medicare Other

## 2015-10-08 VITALS — BP 116/78 | HR 101 | Ht 73.0 in | Wt 176.4 lb

## 2015-10-08 DIAGNOSIS — K7469 Other cirrhosis of liver: Secondary | ICD-10-CM

## 2015-10-08 DIAGNOSIS — J471 Bronchiectasis with (acute) exacerbation: Secondary | ICD-10-CM

## 2015-10-08 DIAGNOSIS — J9621 Acute and chronic respiratory failure with hypoxia: Secondary | ICD-10-CM | POA: Diagnosis not present

## 2015-10-08 DIAGNOSIS — J4541 Moderate persistent asthma with (acute) exacerbation: Secondary | ICD-10-CM

## 2015-10-08 DIAGNOSIS — J690 Pneumonitis due to inhalation of food and vomit: Secondary | ICD-10-CM

## 2015-10-08 DIAGNOSIS — J151 Pneumonia due to Pseudomonas: Secondary | ICD-10-CM | POA: Diagnosis not present

## 2015-10-08 DIAGNOSIS — J330 Polyp of nasal cavity: Secondary | ICD-10-CM

## 2015-10-08 DIAGNOSIS — R05 Cough: Secondary | ICD-10-CM | POA: Diagnosis not present

## 2015-10-08 DIAGNOSIS — J209 Acute bronchitis, unspecified: Secondary | ICD-10-CM | POA: Diagnosis not present

## 2015-10-08 NOTE — Telephone Encounter (Signed)
Patient's wife calling and said that order for Oxygen has not been signed yet so [facility] cannot deliver Oxygen until order has been signed. Notified Dr. Annamaria Boots and he said that he would sign order. Nothing further needed.

## 2015-10-08 NOTE — Assessment & Plan Note (Addendum)
CXR 10/08/2015 shows residual infiltrate right midlung zone, prominent interstitial markings consistent with bronchitis. Although images are not the same, this suggests improvement compared with recent CT chest and his chest exam is definitely clear now. Plan-allow further time for image clearing. Meanwhile check cystic fibrosis panel

## 2015-10-08 NOTE — Assessment & Plan Note (Signed)
Using oxygen 2 L sleep/[facility]. Hypoxemia on room air on arrival today. In qualification for oxygen during exertion. Process appears to be chronic asthma/bronchiectasis with acute component of recent pneumonia. I doubt heart failure or PE. Legs are thin without edema. Mild polycythemia supports recent hypoxia. Plan-continuous oxygen for now at 2 L at rest, 3 L with exertion

## 2015-10-08 NOTE — Assessment & Plan Note (Signed)
Normal hepatic panel 09/2015

## 2015-10-08 NOTE — Assessment & Plan Note (Signed)
No polyps noted anteriorly

## 2015-10-08 NOTE — Assessment & Plan Note (Signed)
Eosinophilia 09/24/14 and 9/ 2015 raises possibility of allergic bronchopulmonary aspergillosis or eosinophilic pneumonia process. When inflammation from recent pneumonia has resolved, would like to check immunoglobulins and SPEP. Suspect eosinophil count is related to his past history of chronic nasal polyps requiring repeated steroid therapy. Plan-treat bronchiectatic component by using the flutter device. No new antibiotics yet. Consider bronchoscopy would be useful to him for direct cultures and transbronchial biopsy.

## 2015-10-08 NOTE — Progress Notes (Signed)
Subjective:    Patient ID: Lee Blevins, male    DOB: 26-Jul-1945, 70 y.o.   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  Has not had significant changes in breathing or nasal symptoms. He is walking 6 miles/ week and tolerating that well. He remains dependent on prednisone 5 mg daily, Advair 1 puff daily, and singulair, nasonex. Coughs up clear mucus at times. No wheeze. Doesn't have a rescue inhaler and doesn't think he needs one- never wheezes now.  11/06/11-65 yoM former smoker followed for asthma, chronic sinusitis, nasal polyps complicated by hx CVA, HBP Pt states having more problems with sinuses .states having some wheezing ,chest congestion . Has had decreased sense of smell for 6 weeks. His previous ENT doctor has retired. Persistent morning cough productive of thick sputum which clears each day. Denies shortness of breath and walks regularly. No headache or fever. Off of prednisone maintenance now for 5 months but continues Nasonex and Singulair with Advair 1 puff twice daily.  11/07/12- 24 yoM former smoker followed for asthma, chronic sinusitis, nasal polyps complicated by hx CVA, HBP FOLLOWS FOR:no SOB, wheezing, cough, or congestion out of the usual since last visit. Mild nasal congestion, taste and smell come and go. Takes daily  Baby aspirin with history of  nasal polyps. Chest okay on Advair.  01/13/14- 41 yoM former smoker followed for asthma, chronic sinusitis, nasal polyps complicated by hx CVA, HBP FOLLOWS FOR: Nasal and chest congestion at times-beige in color. Need refills on Advair and Flonase. Acute: Follows For: pt states he been having troubles with sinus and proiductive cough yellowish in color since about june and hast been any better. pt states hes felt a little weaker and fatigued than  normal. pt states he has lost his smell and taste over the past 3 weeks.  Saw Dr. Johnnette Gourd 3 weeks ago and was given prednisone.  09/06/2015-70 year old male former smoker followed for asthma, chronic sinusitis, nasal polyps complicated by history CVA, HBP FOLLOWS FOR: pt c/o chest congestion, prod cough brownish in color, occ wheezingX3-73mo Increased chest congestion and cough for the past month or so. Frequent brown sputum with no blood, fever, chills or sweats. No recent antibiotics. No prednisone in over 2 months. Use of albuterol rescue inhaler. Rhinosinusitis has been doing very much better. Sense of smell has returned. Denies heartburn or reflux. Dr. [name] tried changing Advair to Symbicort CXR 05/28/2014- IMPRESSION: Mild hyperinflation consistent with reactive airway disease. There is no pneumonia nor other active cardiopulmonary disease. ellectronically Signed  By: Raahil Martinique  On: 05/29/2014 08:57  09/24/2015-70 year old male former smoker followed for asthma, chronic sinusitis, nasal polyps, complicated by history CVA, HBP ACUTE VISIT: SOB with exertion; "nothing makes better" Walk test on room air 09/24/15-dropped to 86%, recovery on O2 2 L; resting room air 90% Sputum culture 09/06/2015-POS Pseudomonas>> Cipro 500 mg twice daily 10 days begun 09/14/2015 Wife here this visit. Together they have described cough productive brown sputum, malaise, some increased shortness of breath 2 months. No acute event. No fever, sweat, blood, adenopathy. No reflux event. Still little use of rescue inhaler-not a lot of wheeze He is now completing 10 days of Cipro 500 mg twice a day. Wife thinks he is coughing less. He still denies any recent flare of his perennial rhinosinusitis no prednisone in over 2 months.Marland Kitchen  CT chest 09/23/2015- I reviewed images independently. Discussed findings with patient IMPRESSION: 1. The appearance the chest is most compatible with severe bronchitis with  multilobar bronchopneumonia, most evident in the right lower lobe and to a lesser extent in the right upper lobe. 2. In addition, there are few scattered pulmonary nodules measuring 5 mm or less in size. No follow-up needed if patient is low-risk (and has no known or suspected primary neoplasm). Non-contrast chest CT can be considered in 12 months if patient is high-risk. This recommendation follows the consensus statement: Guidelines for Management of Incidental Pulmonary Nodules Detected on CT Images:From the Fleischner Society 2017; published online before print (10.1148/radiol.IJ:2314499). 3. Atherosclerosis, including left main and 3 vessel coronary artery disease. Assessment for potential risk factor modification, dietary therapy or pharmacologic therapy may be warranted, if clinically indicated. 4. Morphologic changes in the liver suggestive of early cirrhosis. Electronically Signed  By: Vinnie Langton M.D.  On: 09/23/2015 15:41  10/08/2015-70 year old male former smoker followed for asthma, chronic sinusitis, nasal polyps, complicated by history CVA, HBP FOLLOWS FOR:CXR done today. Pt stillhaving low O2 levels with noctural at 2lpm-wife turned up to 2.5lpm and sats staying in mid 90's. Pt also needs order for daytime O2-levels entered in EPIC.  Most recently treated with Bactrim DS starting 09/30/2015 Sputum culture from 09/06/2015 had grown Pseudomonas sensitive to Cipro, negative for fungi and AFB. Treated with Cipro 5/30- 500 mg twice daily 10 days, 6/9-750 mg twice daily 7 days Quantiferon negative for AFB CBC with differential 09/24/2015-hemoglobin 17.5, eosinophils again elevated 15.9, 16.1 LFTs wnl CXR 10/08/2015-patchy right lower lobe pneumonia with coarse bronchitic markings CXR now shows considerable improvement compared with the CT scan, now only residual infiltrate right lung. He says he doesn't feel better but then admits cough is decreased, producing small  amounts of beige sputum was or twice daily without fever, chills or blood. Last prednisone about 3 months ago.  ROS-see HPI Constitutional:   No-   weight loss, night sweats, fevers, chills, fatigue, lassitude. HEENT:   No-  headaches, difficulty swallowing, tooth/dental problems, sore throat,       No-  sneezing, itching, ear ache, +nasal congestion, post nasal drip,  CV:  No-   chest pain, orthopnea, PND, swelling in lower extremities, anasarca, dizziness, palpitations Resp: No-   shortness of breath with exertion or at rest.             No-   productive cough,  No non-productive cough,  No- coughing up of blood.              No-   change in color of mucus.  No- wheezing.   Skin: No-   rash or lesions. GI:  No-   heartburn, indigestion, abdominal pain, nausea, vomiting,  GU:  MS:  No-   joint pain or swelling.   Neuro-     nothing unusual Psych:  No- change in mood or affect. No depression or anxiety.  No memory loss.  Objective:   Physical Exam General- Alert, Oriented, Affect-appropriate, Distress- none acute, trim Skin- rash-none, lesions- none, excoriation- none Lymphadenopathy- none Head- atraumatic            Eyes- Gross vision intact, PERRLA, conjunctivae clear secretions            Ears- Hearing, canals normal            Nose- Clear, No- Septal dev, mucus +, polyp-none seen, no-erosion, perforation  Throat- Mallampati II-III , mucosa clear , drainage- none, tonsils- atrophic Neck- flexible , trachea midline, no stridor , thyroid nl, carotid no bruit Chest - symmetrical excursion , unlabored           Heart/CV- RRR , no murmur , no gallop  , no rub, nl s1 s2                           - JVD- none , edema- none, stasis changes- none, varices- none           Lung- clear to P&A, wheeze- none, cough- none , dullness-none, rub- none           Chest wall-  Abd-  Br/ Gen/ Rectal- Not done, not indicated Extrem- cyanosis- none, clubbing, none, atrophy- none, strength-  nl Neuro- +Occasional twitch or jerk  Assessment & Plan:

## 2015-10-08 NOTE — Patient Instructions (Addendum)
Order- lab- CF assay                    Fungal panel         Allergy profile                   Sputum culture- routine C&S    Dx Pneumonia   Order- DME [facility]- change O2 order to continuous and portable O2  Dx chronic hypoxic respiratory failure,

## 2015-10-10 LAB — RESPIRATORY CULTURE OR RESPIRATORY AND SPUTUM CULTURE
GRAM STAIN: NONE SEEN
Gram Stain: NONE SEEN
Organism ID, Bacteria: NORMAL

## 2015-10-11 ENCOUNTER — Encounter: Payer: Self-pay | Admitting: Internal Medicine

## 2015-10-11 LAB — RESPIRATORY ALLERGY PROFILE REGION II ~~LOC~~
Allergen, Cedar tree, t12: <0.1 kU/L
Allergen, Cottonwood, t14: 0.19 kU/L — ABNORMAL HIGH
Allergen, Mouse Urine Protein, e78: <0.1 kU/L
Aspergillus fumigatus, m3: <0.1 kU/L
BERMUDA GRASS: 1.73 kU/L — AB
CAT DANDER: 0.13 kU/L — AB
Cladosporium Herbarum: <0.1 kU/L
Common Ragweed: <0.1 kU/L
DOG DANDER: 1.16 kU/L — AB
Elm IgE: 6.16 kU/L — ABNORMAL HIGH
IGE (IMMUNOGLOBULIN E), SERUM: 277 kU/L — AB (ref <115)
JOHNSON GRASS: 0.63 kU/L — AB
Pecan/Hickory Tree IgE: 0.29 kU/L — ABNORMAL HIGH
Penicillium Notatum: <0.1 kU/L
ROUGH PIGWEED IGE: 1.39 kU/L — AB
SHEEP SORREL IGE: 0.13 kU/L — AB
TIMOTHY GRASS: 4.25 kU/L — AB

## 2015-10-11 LAB — MOLD ALLERGENS 1
Alternaria Alternata: <0.1 kU/L
Aspergillus fumigatus, m3: <0.1 kU/L
Cladosporium Herbarum: <0.1 kU/L
Mucor Racemosus: <0.1 kU/L
Penicillium Notatum: <0.1 kU/L

## 2015-10-12 LAB — CYSTIC FIBROSIS DIAGNOSTIC STUDY

## 2015-10-12 MED ORDER — CEFDINIR 300 MG PO CAPS: 300.0000 mg | ORAL_CAPSULE | Freq: Two times a day (BID) | ORAL | Status: DC

## 2015-10-12 NOTE — Telephone Encounter (Signed)
Per CY pt cultures neg, cxr was improved. Pt can take Cefdinir 300mg , BID, 10d.   Pt would like rx [facility]. Rx sent. Pt advised to contact office if not improved after abx. Nothing further needed.

## 2015-10-12 NOTE — Telephone Encounter (Signed)
Pt is concerned that he was not given abx at [facility] to treat his persistent pna. He has finished the Bactrim given to him 09/30/15.   CY Please advise. Thanks!  Allergies  Allergen Reactions  . Amlodipine     edema  . Crestor [Rosuvastatin]     Muscle aches  . Livalo [Pitavastatin]     Muscle aches  . Bystolic [Nebivolol Hcl]     dizziness     Current Outpatient Prescriptions on File Prior to Visit  Medication Sig Dispense Refill  . albuterol (PROVENTIL HFA;VENTOLIN HFA) 108 (90 Base) MCG/ACT inhaler Inhale 2 puffs into the lungs every 6 (six) hours as needed for wheezing or shortness of breath. 1 Inhaler 11  . BENICAR 20 MG tablet TAKE 1 TABLET BY MOUTH EVERY DAY 90 tablet 1  . fluticasone (FLONASE) 50 MCG/ACT nasal spray USE 1 SPRAY IN EACH NOSTRIL TWICE DAILY 48 g 3  . Fluticasone-Salmeterol (ADVAIR) 250-50 MCG/DOSE AEPB Inhale 1 puff into the lungs 2 (two) times daily.    . montelukast (SINGULAIR) 10 MG tablet Take 1 tablet by mouth at  bedtime 90 tablet 1  . Respiratory Therapy Supplies (FLUTTER) DEVI Blow through 4 times per set, Three sets per day 1 each 0  . sulfamethoxazole-trimethoprim (BACTRIM DS,SEPTRA DS) 800-160 MG tablet Take 1 tablet by mouth 2 (two) times daily. 20 tablet 0   No current facility-administered medications on file prior to visit.

## 2015-10-21 ENCOUNTER — Encounter: Payer: Self-pay | Admitting: Internal Medicine

## 2015-10-22 ENCOUNTER — Telehealth: Payer: Self-pay | Admitting: Internal Medicine

## 2015-10-22 NOTE — Telephone Encounter (Signed)
Pt wife calling back [place] needs something sooner pt is still not better

## 2015-10-22 NOTE — Telephone Encounter (Signed)
Lee Blevins  10/21/2015  Patient Email  MRN:  [id number]   Description: 70 year old male  Provider: Deneise Lever, MD  Department: [facility]       Encounter Messages     Read Composed From To Subject    Y 10/22/2015 8:00 AM Randa Spike, CMA [name] RE: Visit Follow-Up Question    Y 10/21/2015 11:32 PM [name] Deneise Lever, MD Visit Follow-Up Question      Encounter Messages  Expand AllCollapse All     RE: Visit Follow-Up Question     From   Randa Spike, CMA    To   Lee Blevins    Sent and Delivered   10/22/2015 8:00 AM       Last Read in [facility]   10/22/2015 9:52 AM by [name]       [name],   Unfortunately, we do not schedule appointments through [facility]. If you are wanting to move your appointment up, please call our office at [phone].            Previous Messages     ----- Message -----   From: Spegal,Aayush   Sent: 10/21/2015 11:32 PM EDT    To: Deneise Lever, MD  Subject: Visit Follow-Up Question   Dr. Annamaria Boots,   I would like to schedule an appointment for next week. Earlier than my current appointment on July 26. I have finished the antibiotics and I do not feel any better. I do not think it is wise for me to wait until July 26.          Visit Follow-Up Question     From   [name]    To   Deneise Lever, MD    Sent   10/21/2015 11:32 PM       Dr. Annamaria Boots,   I would like to schedule an appointment for next week. Earlier than my current appointment on July 26. I have finished the antibiotics and I do not feel any better. I do not think it is wise for me to wait until July 26.            Encounter Diagnoses and Associated Orders     There are no diagnoses or orders entered for this encounter.     Notes      Harland German, MA at 10/22/2015 9:57 AM     Status: Signed       Expand All Collapse All   Pt wife calling back [phone]  [name] needs something sooner pt is still not better             Current View: Showing all answers Show Only Relevant Answers   Legend: Scores, Non-relevant Questions  Questionnaire Answers       No questionnaire available.                Patient Demographics     Patient Name Sex DOB SSN Address Phone    Lee Blevins, Lee Blevins Male January 13, 1946 [id number] [address] [place] [address] [phone] (Home)

## 2015-10-22 NOTE — Telephone Encounter (Signed)
Spoke with pt's wife. Pt's appointment has been moved up to 10/25/15 at 10am with SG. Nothing further was needed.

## 2015-10-22 NOTE — Telephone Encounter (Signed)
Spoke with Vallarie Mare, she is going to make a telephone encounter for this phone call.

## 2015-10-25 ENCOUNTER — Ambulatory Visit (INDEPENDENT_AMBULATORY_CARE_PROVIDER_SITE_OTHER)
Admission: RE | Admit: 2015-10-25 | Discharge: 2015-10-25 | Disposition: A | Discharge status: Home / Self Care | Payer: Medicare Other | Source: Ambulatory Visit | Attending: Acute Care | Admitting: Acute Care

## 2015-10-25 ENCOUNTER — Encounter: Payer: Self-pay | Admitting: Acute Care

## 2015-10-25 ENCOUNTER — Ambulatory Visit (INDEPENDENT_AMBULATORY_CARE_PROVIDER_SITE_OTHER): Payer: Medicare Other | Admitting: Acute Care

## 2015-10-25 VITALS — BP 110/78 | HR 112 | Temp 98.1°F | Ht 73.0 in | Wt 169.4 lb

## 2015-10-25 DIAGNOSIS — J151 Pneumonia due to Pseudomonas: Secondary | ICD-10-CM | POA: Diagnosis not present

## 2015-10-25 DIAGNOSIS — R05 Cough: Secondary | ICD-10-CM

## 2015-10-25 DIAGNOSIS — R059 Cough, unspecified: Secondary | ICD-10-CM

## 2015-10-25 LAB — FUNGUS CULTURE W SMEAR

## 2015-10-25 LAB — AFB CULTURE WITH SMEAR (NOT AT ARMC)

## 2015-10-25 MED ORDER — OMEPRAZOLE 40 MG PO CPDR: 40.0000 mg | DELAYED_RELEASE_CAPSULE | Freq: Every day | ORAL | Status: DC

## 2015-10-25 MED ORDER — AEROCHAMBER MV MISC: Status: DC

## 2015-10-25 MED ORDER — PREDNISONE 10 MG PO TABS: ORAL_TABLET | ORAL | Status: DC

## 2015-10-25 NOTE — Progress Notes (Signed)
History of Present Illness Lee Blevins is a 70 y.o. male with followed for asthma, chronic sinusitis, nasal polyps complicated by history CVA, HBP. He is oxygen dependent 2L with rest and 3L with activity.   7/10/2017Follow Up Appointment Cough. Pt. Presents for follow up of RLL pneumonia with persistent cough.He had a right lower lobe  pneumonia in June 2017. He has been treated with Cipro x 2, starting in June. He was then treated with Bactrim  And then with Omnicef. Sputum cultures have been done and were positive for Pseudomonas sensitive to Cipro. He states he has no smell or taste, which is not new for him. He has had this in the past. He has lost 10 pounds since May as he does not eat when he cannot taste or smell.He states he does have indigestion. He is using his flutter valve as prescribed.He uses his rescue inhaler about 2 times daily.He denies fever, chest pain, purulent secretions, orthopnea or hemoptysis. I did consult with Dr. Annamaria Boots regarding Lee Blevins care.  Tests  Sputum culture from 09/06/2015 had grown Pseudomonas sensitive to Cipro, negative for fungi and AFB. Treated with Cipro 5/30- 500 mg twice daily 10 days, 6/9-750 mg twice daily 7 days Quantiferon negative for AFB CBC with differential 09/24/2015-hemoglobin 17.5, eosinophils again elevated 15.9, 16.1 LFTs wnl CXR 10/08/2015-patchy right lower lobe pneumonia with coarse bronchitic markings CXR now shows considerable improvement compared with the CT scan, now only residual infiltrate right lung.  CXR 10/22/2015:  IMPRESSION: Improving right lower lobe opacity compared to prior exam, although density remains suggesting residual inflammation or scarring. Continued radiographic follow-up is recommended to ensure resolution.  10/08/2015: IMPRESSION: Patchy airspace opacity in the right lower lung suggests pneumonia.  1. The appearance the chest is most compatible with severe bronchitis with multilobar  bronchopneumonia, most evident in the right lower lobe and to a lesser extent in the right upper lobe. 2. In addition, there are few scattered pulmonary nodules measuring 5 mm or less in size. No follow-up needed if patient is low-risk (and has no known or suspected primary neoplasm). Non-contrast chest CT can be considered in 12 months if patient is high-risk. This recommendation follows the consensus statement: Guidelines for Management of Incidental Pulmonary Nodules Detected on CT Images:From the Fleischner Society 2017; published online before print (10.1148/radiol.IJ:2314499). 3. Atherosclerosis, including left main and 3 vessel coronary artery disease. Assessment for potential risk factor modification, dietary therapy or pharmacologic therapy may be warranted, if clinically indicated. 4. Morphologic changes in the liver suggestive of early cirrhosis.  Past medical hx Past Medical History  Diagnosis Date  . Asthma   . Pneumonia   . Chronic sinusitis   . Nasal polyps   . Hyperlipidemia   . HTN (hypertension)   . History of CVA (cerebrovascular accident)      Past surgical hx, Family hx, Social hx all reviewed.  Current Outpatient Prescriptions on File Prior to Visit  Medication Sig  . albuterol (PROVENTIL HFA;VENTOLIN HFA) 108 (90 Base) MCG/ACT inhaler Inhale 2 puffs into the lungs every 6 (six) hours as needed for wheezing or shortness of breath.  . BENICAR 20 MG tablet TAKE 1 TABLET BY MOUTH EVERY DAY  . fluticasone (FLONASE) 50 MCG/ACT nasal spray USE 1 SPRAY IN EACH NOSTRIL TWICE DAILY  . Fluticasone-Salmeterol (ADVAIR) 250-50 MCG/DOSE AEPB Inhale 1 puff into the lungs 2 (two) times daily.  . montelukast (SINGULAIR) 10 MG tablet Take 1 tablet by mouth at  bedtime  . Respiratory  Therapy Supplies (FLUTTER) DEVI Blow through 4 times per set, Three sets per day   No current facility-administered medications on file prior to visit.     Allergies  Allergen Reactions  .  Amlodipine     edema  . Crestor [Rosuvastatin]     Muscle aches  . Livalo [Pitavastatin]     Muscle aches  . Bystolic [Nebivolol Hcl]     dizziness    Review Of Systems:  Constitutional:   +  weight loss, no night sweats,  Fevers, chills, + fatigue, or  lassitude.  HEENT:   No headaches,  Difficulty swallowing,  Tooth/dental problems, or  Sore throat,                No sneezing, itching, ear ache, nasal congestion, post nasal drip,   CV:  No chest pain,  Orthopnea, PND, swelling in lower extremities, anasarca, dizziness, palpitations, syncope.   GI  + heartburn, indigestion, no abdominal pain, nausea, vomiting, diarrhea, change in bowel habits, loss of appetite, bloody stools.   Resp: + shortness of breath with exertion less at rest.  No excess mucus, + productive cough,  No non-productive cough,  No coughing up of blood.  No change in color of mucus.  No wheezing.  No chest wall deformity  Skin: no rash or lesions.  GU: no dysuria, change in color of urine, no urgency or frequency.  No flank pain, no hematuria   MS:  No joint pain or swelling.  No decreased range of motion.  No back pain.  Psych:  No change in mood or affect. No depression or anxiety.  No memory loss.   Vital Signs BP 110/78 mmHg  Pulse 112  Temp(Src) 98.1 F (36.7 C) (Oral)  Ht 6\' 1"  (1.854 m)  Wt 169 lb 6.4 oz (76.839 kg)  BMI 22.35 kg/m2  SpO2 92%   Physical Exam:  General- No distress,  A&Ox3, very thin , frail male appears older than stated age. ENT: No sinus tenderness, TM clear, pale nasal mucosa, no oral exudate,no post nasal drip, no LAN Cardiac: S1, S2, regular rate and rhythm, no murmur Chest: No wheeze/ rales/ dullness; no accessory muscle use, no nasal flaring, no sternal retractions Abd.: Soft Non-tender Ext: No clubbing cyanosis, edema Neuro:  normal strength Skin: No rashes, warm and dry Psych: normal mood and behavior   Assessment/Plan  Pneumonia due to Pseudomonas  (HCC) Continued residual infiltrate per CXR Plan: We will do a CXR today to see if your pneumonia has cleared. We will do a prednisone taper today Prednisone taper; 10 mg tablets: 4 tabs x 2 days, 3 tabs x 2 days, 2 tabs x 2 days 1 tab x 2 days then stop. Prilosec 40 mg at night   Every day. We will give you a GERD diet  to follow. We will teach you how to use a spacer with your rescue inhaler. Avoid mint, menthol and chocolate Sips of water instead of throat clearing. Sugar free Jolly Ranchers for throat soothing. Swallow evaluation will be scheduled today. Follow up with Dr. Annamaria Boots July 26th as is already scheduled. Consider Bronch and further testing if no improvement. Please contact office for sooner follow up if symptoms do not improve or worsen or seek emergency care       Magdalen Spatz, NP 10/25/2015  2:03 PM

## 2015-10-25 NOTE — Assessment & Plan Note (Addendum)
Continued residual infiltrate per CXR Plan: We will do a CXR today to see if your pneumonia has cleared. Will need CXR prior to 7/26 appointment We will do a prednisone taper today Prednisone taper; 10 mg tablets: 4 tabs x 2 days, 3 tabs x 2 days, 2 tabs x 2 days 1 tab x 2 days then stop. Prilosec 40 mg at night   Every day. We will give you a GERD diet  to follow. We will teach you how to use a spacer with your rescue inhaler. Avoid mint, menthol and chocolate Sips of water instead of throat clearing. Sugar free Jolly Ranchers for throat soothing. Swallow evaluation will be scheduled today. Follow up with Dr. Annamaria Boots July 26th as is already scheduled. Consider Bronch and further testing if no improvement. Please contact office for sooner follow up if symptoms do not improve or worsen or seek emergency care

## 2015-10-25 NOTE — Patient Instructions (Addendum)
It is nice to meet you today. We will do a CXR today to see if your pneumonia has cleared. We will do a prednisone taper today Prednisone taper; 10 mg tablets: 4 tabs x 2 days, 3 tabs x 2 days, 2 tabs x 2 days 1 tab x 2 days then stop. Prilosec 40 mg at night   Every day. We will give you a GERD diet  to follow. We will teach you how to use a spacer with your rescue inhaler. Avoid mint, menthol and chocolate Sips of water instead of throat clearing. Sugar free Jolly Ranchers for throat soothing. Swallow evaluation will be scheduled today. Follow up with Dr. Annamaria Boots July 26th as is already scheduled. Please contact office for sooner follow up if symptoms do not improve or worsen or seek emergency care

## 2015-10-26 ENCOUNTER — Other Ambulatory Visit: Payer: Self-pay | Admitting: Internal Medicine

## 2015-10-26 DIAGNOSIS — J151 Pneumonia due to Pseudomonas: Secondary | ICD-10-CM

## 2015-10-28 ENCOUNTER — Ambulatory Visit (HOSPITAL_COMMUNITY)
Admission: RE | Admit: 2015-10-28 | Discharge: 2015-10-28 | Disposition: A | Discharge status: Home / Self Care | Payer: Medicare Other | Source: Ambulatory Visit | Attending: Acute Care | Admitting: Acute Care

## 2015-10-28 DIAGNOSIS — J189 Pneumonia, unspecified organism: Secondary | ICD-10-CM | POA: Diagnosis not present

## 2015-10-28 DIAGNOSIS — R059 Cough, unspecified: Secondary | ICD-10-CM

## 2015-10-28 DIAGNOSIS — K225 Diverticulum of esophagus, acquired: Secondary | ICD-10-CM | POA: Diagnosis not present

## 2015-10-28 DIAGNOSIS — R05 Cough: Secondary | ICD-10-CM | POA: Insufficient documentation

## 2015-11-10 ENCOUNTER — Encounter: Payer: Self-pay | Admitting: Internal Medicine

## 2015-11-10 ENCOUNTER — Ambulatory Visit (INDEPENDENT_AMBULATORY_CARE_PROVIDER_SITE_OTHER): Payer: Medicare Other | Admitting: Internal Medicine

## 2015-11-10 ENCOUNTER — Other Ambulatory Visit (INDEPENDENT_AMBULATORY_CARE_PROVIDER_SITE_OTHER): Payer: Medicare Other

## 2015-11-10 ENCOUNTER — Ambulatory Visit (INDEPENDENT_AMBULATORY_CARE_PROVIDER_SITE_OTHER)
Admission: RE | Admit: 2015-11-10 | Discharge: 2015-11-10 | Disposition: A | Discharge status: Home / Self Care | Payer: Medicare Other | Source: Ambulatory Visit | Attending: Internal Medicine | Admitting: Internal Medicine

## 2015-11-10 VITALS — BP 126/72 | HR 98 | Ht 73.0 in | Wt 165.2 lb

## 2015-11-10 DIAGNOSIS — J151 Pneumonia due to Pseudomonas: Secondary | ICD-10-CM

## 2015-11-10 DIAGNOSIS — J449 Chronic obstructive pulmonary disease, unspecified: Secondary | ICD-10-CM

## 2015-11-10 DIAGNOSIS — J45909 Unspecified asthma, uncomplicated: Secondary | ICD-10-CM | POA: Diagnosis not present

## 2015-11-10 DIAGNOSIS — J330 Polyp of nasal cavity: Secondary | ICD-10-CM | POA: Diagnosis not present

## 2015-11-10 DIAGNOSIS — J189 Pneumonia, unspecified organism: Secondary | ICD-10-CM | POA: Diagnosis not present

## 2015-11-10 DIAGNOSIS — J4541 Moderate persistent asthma with (acute) exacerbation: Secondary | ICD-10-CM

## 2015-11-10 DIAGNOSIS — J209 Acute bronchitis, unspecified: Secondary | ICD-10-CM

## 2015-11-10 LAB — CBC WITH DIFFERENTIAL/PLATELET
BASOS PCT: 0.1 % (ref 0.0–3.0)
Basophils Absolute: 0 10*3/uL (ref 0.0–0.1)
Eosinophils Absolute: 5.1 10*3/uL — ABNORMAL HIGH (ref 0.0–0.7)
HCT: 44.4 % (ref 39.0–52.0)
Hemoglobin: 15 g/dL (ref 13.0–17.0)
LYMPHS ABS: 1.2 10*3/uL (ref 0.7–4.0)
Lymphocytes Relative: 9.9 % — ABNORMAL LOW (ref 12.0–46.0)
MCHC: 33.7 g/dL (ref 30.0–36.0)
MCV: 92.2 fl (ref 78.0–100.0)
MONO ABS: 0.7 10*3/uL (ref 0.1–1.0)
Monocytes Relative: 5.9 % (ref 3.0–12.0)
NEUTROS ABS: 5.4 10*3/uL (ref 1.4–7.7)
NEUTROS PCT: 43.2 % (ref 43.0–77.0)
PLATELETS: 267 10*3/uL (ref 150.0–400.0)
RBC: 4.82 Mil/uL (ref 4.22–5.81)
RDW: 13.1 % (ref 11.5–15.5)
WBC: 12.4 10*3/uL — ABNORMAL HIGH (ref 4.0–10.5)

## 2015-11-10 MED ORDER — PREDNISONE 5 MG PO TABS: ORAL_TABLET | ORAL | 5 refills | Status: DC

## 2015-11-10 NOTE — Progress Notes (Signed)
Subjective:    Patient ID: Lee Blevins, male    DOB: [birth year], 70 y.o.   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  Has not had significant changes in breathing or nasal symptoms. He is walking 6 miles/ week and tolerating that well. He remains dependent on prednisone 5 mg daily, Advair 1 puff daily, and singulair, nasonex. Coughs up clear mucus at times. No wheeze. Doesn't have a rescue inhaler and doesn't think he needs one- never wheezes now.  11/06/11-65 yoM former smoker followed for asthma, chronic sinusitis, nasal polyps complicated by hx CVA, HBP Pt states having more problems with sinuses .states having some wheezing ,chest congestion . Has had decreased sense of smell for 6 weeks. His previous ENT doctor has retired. Persistent morning cough productive of thick sputum which clears each day. Denies shortness of breath and walks regularly. No headache or fever. Off of prednisone maintenance now for 5 months but continues Nasonex and Singulair with Advair 1 puff twice daily.  11/07/12- 52 yoM former smoker followed for asthma, chronic sinusitis, nasal polyps complicated by hx CVA, HBP FOLLOWS FOR:no SOB, wheezing, cough, or congestion out of the usual since last visit. Mild nasal congestion, taste and smell come and go. Takes daily  Baby aspirin with history of  nasal polyps. Chest okay on Advair.  01/13/14- 27 yoM former smoker followed for asthma, chronic sinusitis, nasal polyps complicated by hx CVA, HBP FOLLOWS FOR: Nasal and chest congestion at times-beige in color. Need refills on Advair and Flonase. Acute: Follows For: pt states he been having troubles with sinus and proiductive cough yellowish in color since about june and hast been any better. pt states hes felt a little weaker and fatigued than  normal. pt states he has lost his smell and taste over the past 3 weeks.  Saw Dr. Johnnette Gourd 3 weeks ago and was given prednisone.  09/06/2015-70 year old male former smoker followed for asthma, chronic sinusitis, nasal polyps complicated by history CVA, HBP FOLLOWS FOR: pt c/o chest congestion, prod cough brownish in color, occ wheezingX3-25mo Increased chest congestion and cough for the past month or so. Frequent brown sputum with no blood, fever, chills or sweats. No recent antibiotics. No prednisone in over 2 months. Use of albuterol rescue inhaler. Rhinosinusitis has been doing very much better. Sense of smell has returned. Denies heartburn or reflux. Dr. [name] tried changing Advair to Symbicort CXR 05/28/2014- IMPRESSION: Mild hyperinflation consistent with reactive airway disease. There is no pneumonia nor other active cardiopulmonary disease. ellectronically Signed  By: Gerad Martinique  On: 05/29/2014 08:57  09/24/2015-70 year old male former smoker followed for asthma, chronic sinusitis, nasal polyps, complicated by history CVA, HBP ACUTE VISIT: SOB with exertion; "nothing makes better" Walk test on room air 09/24/15-dropped to 86%, recovery on O2 2 L; resting room air 90% Sputum culture 09/06/2015-POS Pseudomonas>> Cipro 500 mg twice daily 10 days begun 09/14/2015 Wife here this visit. Together they have described cough productive brown sputum, malaise, some increased shortness of breath 2 months. No acute event. No fever, sweat, blood, adenopathy. No reflux event. Still little use of rescue inhaler-not a lot of wheeze He is now completing 10 days of Cipro 500 mg twice a day. Wife thinks he is coughing less. He still denies any recent flare of his perennial rhinosinusitis no prednisone in over 2 months.Marland Kitchen  CT chest 09/23/2015- I reviewed images independently. Discussed findings with patient IMPRESSION: 1. The appearance the chest is most compatible with severe bronchitis with  multilobar bronchopneumonia, most evident in the right lower lobe and to a lesser extent in the right upper lobe. 2. In addition, there are few scattered pulmonary nodules measuring 5 mm or less in size. No follow-up needed if patient is low-risk (and has no known or suspected primary neoplasm). Non-contrast chest CT can be considered in 12 months if patient is high-risk. This recommendation follows the consensus statement: Guidelines for Management of Incidental Pulmonary Nodules Detected on CT Images:From the Fleischner Society 2017; published online before print (10.1148/radiol.SG:5268862). 3. Atherosclerosis, including left main and 3 vessel coronary artery disease. Assessment for potential risk factor modification, dietary therapy or pharmacologic therapy may be warranted, if clinically indicated. 4. Morphologic changes in the liver suggestive of early cirrhosis. Electronically Signed  By: Vinnie Langton M.D.  On: 09/23/2015 15:41  10/08/2015-70 year old male former smoker followed for asthma, chronic sinusitis, nasal polyps, complicated by history CVA, HBP FOLLOWS FOR:CXR done today. Pt stillhaving low O2 levels with noctural at 2lpm-wife turned up to 2.5lpm and sats staying in mid 90's. Pt also needs order for daytime O2-levels entered in EPIC.  Most recently treated with Bactrim DS starting 09/30/2015 Sputum culture from 09/06/2015 had grown Pseudomonas sensitive to Cipro, negative for fungi and AFB. Treated with Cipro 5/30- 500 mg twice daily 10 days, 6/9-750 mg twice daily 7 days Quantiferon negative for AFB CBC with differential 09/24/2015-hemoglobin 17.5, eosinophils again elevated 15.9, 16.1 LFTs wnl CXR 10/08/2015-patchy right lower lobe pneumonia with coarse bronchitic markings CXR now shows considerable improvement compared with the CT scan, now only residual infiltrate right lung. He says he doesn't feel better but then admits cough is decreased, producing small  amounts of beige sputum was or twice daily without fever, chills or blood. Last prednisone about 3 months ago.  11/10/2015-70 year old male former smoker followed for asthma/bronchitis, eosinophilia, lung nodules, chronic sinusitis, nasal polyps, chronic hypoxic respiratory failure complicated by history CVA, HBP, CAD FOLLOWS FOR: Last seen on 10-26-15 by SG; CXR today-attached. Pt states he is feeling better since being seen last and denies any cough, congestion(excessive), fever, or chills. LOV 7/10- NP-  F/u RLL pneumonia in June, cultured Pseudomonas Rx'd cipro. Treated with prednisone taper, Prilosec, GERD diet, spacer for rescue inhaler Wife here Clearly feeling better, less anxious and looks stronger. Breathing is unlabored. Question how much of his improvement is due to Cipro and how much to the prednisone taper. CXR today- IMPRESSION: Reactive airway disease. Ongoing improvement in patchy right lower lobe pneumonia. Slight interval increase conspicuity of right perihilar linear density in the mid lung likely reflects atelectasis. An additional follow-up chest x-ray in 2-3 weeks is recommended assuming the patient continues to improve clinically. Aortic atherosclerosis. Electronically Signed   By: Rafay  Martinique M.D.   On: 11/10/2015 09:03 Labs 10/08/2015-sputum culture normal flora, mold IgE panel all negative, CF panel negative, elevated IgE for cat, dog, grass, tree pollens Barium swallow negative for reflux, incidental small diverticulum  ROS-see HPI Constitutional:   No-   weight loss, night sweats, fevers, chills, fatigue, lassitude. HEENT:   No-  headaches, difficulty swallowing, tooth/dental problems, sore throat,       No-  sneezing, itching, ear ache, +nasal congestion, post nasal drip,  CV:  No-   chest pain, orthopnea, PND, swelling in lower extremities, anasarca, dizziness, palpitations Resp: No-   shortness of breath with exertion  or at rest.             No-    productive cough,  No non-productive cough,  No- coughing up of blood.              No-   change in color of mucus.  No- wheezing.   Skin: No-   rash or lesions. GI:  No-   heartburn, indigestion, abdominal pain, nausea, vomiting,  GU:  MS:  No-   joint pain or swelling.   Neuro-     nothing unusual Psych:  No- change in mood or affect. No depression or anxiety.  No memory loss.  Objective:   Physical Exam General- Alert, Oriented, Affect-appropriate, Distress- none acute, trim Skin- rash-none, lesions- none, excoriation- none Lymphadenopathy- none Head- atraumatic            Eyes- Gross vision intact, PERRLA, conjunctivae clear secretions            Ears- Hearing, canals normal            Nose- Clear, No- Septal dev, mucus +, polyp-none seen, no-erosion, perforation             Throat- Mallampati II-III , mucosa clear , drainage- none, tonsils- atrophic Neck- flexible , trachea midline, no stridor , thyroid nl, carotid no bruit Chest - symmetrical excursion , unlabored           Heart/CV- RRR , no murmur , no gallop  , no rub, nl s1 s2                           - JVD- none , edema- none, stasis changes- none, varices- none           Lung- + coarse but clear to P&A, wheeze- none, cough- none , dullness-none, rub- none           Chest wall-  Abd-  Br/ Gen/ Rectal- Not done, not indicated Extrem- cyanosis- none, clubbing, none, atrophy- none, strength- nl Neuro- +Occasional twitch or jerk  Assessment & Plan:

## 2015-11-10 NOTE — Patient Instructions (Addendum)
Order- lab- CBC w diff      Dx chronic asthmatic bronchitis  Script sent for prednisone 5 mg to take one every other day  We want to reduce any chance of refluxing stomach juice up into your lungs at night. Don't eat shortly before bed. Consider elevating the head of your bed with a brick under each of the head legs.  Please call as needed

## 2015-11-11 NOTE — Assessment & Plan Note (Signed)
Chronic bronchitis process complicated by recent Pseudomonas pneumonia and by past history of nasal polyps, peripheral eosinophilia with negative mold IgE allergy profile. Barium swallow negative for obvious reflux. Consider possibility of an eosinophilic syndrome including Chirgue Strauss, eosinophilic pneumonia ABPA. If he can't be suppressed and stabilized with very low-dose maintenance prednisone, then we will reconsider potential benefit for bronchoscopy as discussed. Plan-low-dose maintenance prednisone, trying to 5 mg every other day with steroid discussion. CBC to update eosinophils status.

## 2015-11-11 NOTE — Assessment & Plan Note (Signed)
No significant nasal polyps anteriorly at this time, reflecting steroid therapy.

## 2015-11-11 NOTE — Assessment & Plan Note (Addendum)
Clinically resolved. CXR today shows significant improvement right lower lung zone. Plan-follow-up sputum culture, CXR

## 2015-11-18 ENCOUNTER — Encounter: Payer: Self-pay | Admitting: Internal Medicine

## 2015-11-19 NOTE — Telephone Encounter (Signed)
CY - please advise. Thanks! 

## 2015-11-19 NOTE — Telephone Encounter (Signed)
I am watching to see how he does on low dose maintenance prednisone, since response to this will help me understand what the elevated eosinophil count in his blood means. If he continues to feel as well as he did at last visit, then travel should be ok. We have an appointment in October, deliberately chosen so we could see how he does over time. If he needs to be seen sooner, let us know.

## 2015-12-14 ENCOUNTER — Ambulatory Visit (INDEPENDENT_AMBULATORY_CARE_PROVIDER_SITE_OTHER): Payer: Medicare Other | Admitting: Internal Medicine

## 2015-12-14 ENCOUNTER — Ambulatory Visit (INDEPENDENT_AMBULATORY_CARE_PROVIDER_SITE_OTHER)
Admission: RE | Admit: 2015-12-14 | Discharge: 2015-12-14 | Disposition: A | Discharge status: Home / Self Care | Payer: Medicare Other | Source: Ambulatory Visit | Attending: Internal Medicine | Admitting: Internal Medicine

## 2015-12-14 ENCOUNTER — Encounter: Payer: Self-pay | Admitting: Internal Medicine

## 2015-12-14 ENCOUNTER — Other Ambulatory Visit (INDEPENDENT_AMBULATORY_CARE_PROVIDER_SITE_OTHER): Payer: Medicare Other

## 2015-12-14 VITALS — BP 120/80 | HR 90 | Temp 97.7°F | Resp 16 | Ht 73.0 in | Wt 165.5 lb

## 2015-12-14 DIAGNOSIS — D72829 Elevated white blood cell count, unspecified: Secondary | ICD-10-CM

## 2015-12-14 DIAGNOSIS — R531 Weakness: Secondary | ICD-10-CM | POA: Diagnosis not present

## 2015-12-14 DIAGNOSIS — R627 Adult failure to thrive: Secondary | ICD-10-CM

## 2015-12-14 DIAGNOSIS — J189 Pneumonia, unspecified organism: Secondary | ICD-10-CM

## 2015-12-14 DIAGNOSIS — K7469 Other cirrhosis of liver: Secondary | ICD-10-CM

## 2015-12-14 DIAGNOSIS — I2510 Atherosclerotic heart disease of native coronary artery without angina pectoris: Secondary | ICD-10-CM

## 2015-12-14 DIAGNOSIS — Z23 Encounter for immunization: Secondary | ICD-10-CM | POA: Diagnosis not present

## 2015-12-14 DIAGNOSIS — IMO0001 Reserved for inherently not codable concepts without codable children: Secondary | ICD-10-CM | POA: Insufficient documentation

## 2015-12-14 DIAGNOSIS — R918 Other nonspecific abnormal finding of lung field: Secondary | ICD-10-CM

## 2015-12-14 DIAGNOSIS — M609 Myositis, unspecified: Secondary | ICD-10-CM

## 2015-12-14 DIAGNOSIS — M791 Myalgia: Secondary | ICD-10-CM

## 2015-12-14 DIAGNOSIS — R9431 Abnormal electrocardiogram [ECG] [EKG]: Secondary | ICD-10-CM

## 2015-12-14 DIAGNOSIS — I10 Essential (primary) hypertension: Secondary | ICD-10-CM

## 2015-12-14 LAB — CBC WITH DIFFERENTIAL/PLATELET
BASOS ABS: 0 10*3/uL (ref 0.0–0.1)
Basophils Relative: 0.1 % (ref 0.0–3.0)
EOS ABS: 6.8 10*3/uL — AB (ref 0.0–0.7)
Eosinophils Relative: 49.1 % — ABNORMAL HIGH (ref 0.0–5.0)
HEMATOCRIT: 39.4 % (ref 39.0–52.0)
HEMOGLOBIN: 13.4 g/dL (ref 13.0–17.0)
Lymphs Abs: 1.1 10*3/uL (ref 0.7–4.0)
MCHC: 33.9 g/dL (ref 30.0–36.0)
MCV: 92 fl (ref 78.0–100.0)
MONOS PCT: 3.2 % (ref 3.0–12.0)
Monocytes Absolute: 0.4 10*3/uL (ref 0.1–1.0)
NEUTROS ABS: 5.5 10*3/uL (ref 1.4–7.7)
Neutrophils Relative %: 39.8 % — ABNORMAL LOW (ref 43.0–77.0)
PLATELETS: 414 10*3/uL — AB (ref 150.0–400.0)
RBC: 4.29 Mil/uL (ref 4.22–5.81)
RDW: 15.5 % (ref 11.5–15.5)
WBC: 13.8 10*3/uL — AB (ref 4.0–10.5)

## 2015-12-14 LAB — CK: Total CK: 75 U/L (ref 7–232)

## 2015-12-14 LAB — PROTIME-INR
INR: 1.1 ratio — ABNORMAL HIGH (ref 0.8–1.0)
Prothrombin Time: 11.8 s (ref 9.6–13.1)

## 2015-12-14 LAB — SEDIMENTATION RATE: SED RATE: 37 mm/h — AB (ref 0–20)

## 2015-12-14 LAB — C-REACTIVE PROTEIN: CRP: 2.8 mg/dL (ref 0.5–20.0)

## 2015-12-14 MED ORDER — MIRTAZAPINE 7.5 MG PO TABS: 7.5000 mg | ORAL_TABLET | Freq: Every day | ORAL | 3 refills | Status: DC

## 2015-12-14 NOTE — Progress Notes (Signed)
Pre visit review using our clinic review tool, if applicable. No additional management support is needed unless otherwise documented below in the visit note. 

## 2015-12-14 NOTE — Progress Notes (Signed)
Subjective:  Patient ID: Lee Blevins, male    DOB: February 06, 1946  Age: 70 y.o. MRN: [id number]  CC: Hypertension and Coronary Artery Disease   HPI Lee Blevins presents for concerns about weight loss, loss of appetite, fatigue, lethargy, night sweats, weakness, and muscle aches. He has been seeing pulmonology for the last 3 months for an atypical pneumonia. He has taken several courses of antibiotics and most recently started on steroids. He had an abnormal chest x-ray that showed multiple infiltrates. During this time he has also had an elevated white blood cell count. He has lost about 20 pounds. He has no taste or interest in food. A recent CT scan raises concerned about hepatic cirrhosis. His cough is resolved and he denies chest pain or shortness of breath. He has had no palpitations or syncope.  Outpatient Medications Prior to Visit  Medication Sig Dispense Refill  . albuterol (PROVENTIL HFA;VENTOLIN HFA) 108 (90 Base) MCG/ACT inhaler Inhale 2 puffs into the lungs every 6 (six) hours as needed for wheezing or shortness of breath. 1 Inhaler 11  . BENICAR 20 MG tablet TAKE 1 TABLET BY MOUTH EVERY DAY 90 tablet 1  . fluticasone (FLONASE) 50 MCG/ACT nasal spray USE 1 SPRAY IN EACH NOSTRIL TWICE DAILY 48 g 3  . Fluticasone-Salmeterol (ADVAIR) 250-50 MCG/DOSE AEPB Inhale 1 puff into the lungs 2 (two) times daily.    . montelukast (SINGULAIR) 10 MG tablet Take 1 tablet by mouth at  bedtime 90 tablet 1  . omeprazole (PRILOSEC) 40 MG capsule Take 1 capsule (40 mg total) by mouth daily. 30 capsule 6  . predniSONE (DELTASONE) 5 MG tablet 1 every other day 30 tablet 5  . Respiratory Therapy Supplies (FLUTTER) DEVI Blow through 4 times per set, Three sets per day 1 each 0  . Spacer/Aero-Holding Chambers (AEROCHAMBER MV) inhaler Use as instructed 1 each 0   No facility-administered medications prior to visit.     ROS Review of Systems  Constitutional: Positive for activity change, appetite change,  fatigue and unexpected weight change. Negative for chills and fever.  HENT: Negative.  Negative for facial swelling, sinus pressure, sore throat, trouble swallowing and voice change.   Eyes: Negative.   Respiratory: Negative for cough, choking, chest tightness, shortness of breath, wheezing and stridor.   Cardiovascular: Negative.  Negative for chest pain, palpitations and leg swelling.  Gastrointestinal: Negative.  Negative for abdominal pain, diarrhea, nausea and vomiting.  Endocrine: Negative.   Genitourinary: Negative.  Negative for difficulty urinating.  Musculoskeletal: Positive for myalgias. Negative for arthralgias, back pain, gait problem, joint swelling, neck pain and neck stiffness.  Skin: Negative.  Negative for color change.  Allergic/Immunologic: Negative.   Neurological: Positive for weakness. Negative for dizziness, tremors, light-headedness and numbness.  Hematological: Negative.  Negative for adenopathy. Does not bruise/bleed easily.  Psychiatric/Behavioral: Negative.     Objective:  BP 120/80 (BP Location: Left Arm, Patient Position: Sitting, Cuff Size: Normal)   Pulse 90   Temp 97.7 F (36.5 C) (Oral)   Resp 16   Ht 6\' 1"  (1.854 m)   Wt 165 lb 8 oz (75.1 kg)   SpO2 97%   BMI 21.84 kg/m   BP Readings from Last 3 Encounters:  12/14/15 120/80  11/10/15 126/72  10/25/15 110/78    Wt Readings from Last 3 Encounters:  12/14/15 165 lb 8 oz (75.1 kg)  11/10/15 165 lb 3.2 oz (74.9 kg)  10/25/15 169 lb 6.4 oz (76.8 kg)    Physical  Exam  Constitutional: He is oriented to person, place, and time. No distress.  HENT:  Mouth/Throat: Oropharynx is clear and moist. No oropharyngeal exudate.  Eyes: Conjunctivae are normal. Right eye exhibits no discharge. Left eye exhibits no discharge. No scleral icterus.  Neck: Normal range of motion. Neck supple. No JVD present. No tracheal deviation present. No thyromegaly present.  Cardiovascular: Normal rate, regular rhythm,  normal heart sounds and intact distal pulses.  Exam reveals no gallop and no friction rub.   No murmur heard. EKG ---  Sinus  Rhythm  Low voltage in limb leads.   -Old inferior infarct.   ABNORMAL   Pulmonary/Chest: Effort normal. No accessory muscle usage or stridor. No tachypnea. No respiratory distress. He has decreased breath sounds in the right upper field, the right middle field, the right lower field, the left upper field, the left middle field and the left lower field. He has no wheezes. He has no rhonchi. He has no rales.  Abdominal: Soft. Bowel sounds are normal. He exhibits no distension and no mass. There is no tenderness. There is no rebound and no guarding.  Musculoskeletal: Normal range of motion. He exhibits no edema, tenderness or deformity.  Lymphadenopathy:    He has no cervical adenopathy.  Neurological: He is oriented to person, place, and time.  Skin: Skin is warm and dry. No rash noted. He is not diaphoretic. No erythema. No pallor.  Psychiatric: He has a normal mood and affect. His behavior is normal. Judgment and thought content normal.  Vitals reviewed.   Lab Results  Component Value Date   WBC 13.8 (H) 12/14/2015   HGB 13.4 12/14/2015   HCT 39.4 12/14/2015   PLT 414.0 (H) 12/14/2015   GLUCOSE 96 09/20/2015   CHOL 235 (H) 06/08/2015   TRIG 75.0 06/08/2015   HDL 89.50 06/08/2015   LDLDIRECT 160.0 06/13/2013   LDLCALC 131 (H) 06/08/2015   ALT 11 09/24/2015   AST 21 09/24/2015   NA 136 09/20/2015   K 4.2 09/20/2015   CL 101 09/20/2015   CREATININE 0.87 09/20/2015   BUN 11 09/20/2015   CO2 29 09/20/2015   TSH 3.29 12/14/2015   PSA 0.66 06/08/2015   INR 1.1 (H) 12/14/2015   HGBA1C 5.5 06/08/2015    Dg Chest 2 View  Result Date: 11/10/2015 CLINICAL DATA:  Follow-up of pneumonia ; history of asthma and hypertension EXAM: CHEST  2 VIEW COMPARISON:  PA and lateral chest x-ray of October 25, 2015 FINDINGS: The lungs are mildly hyperinflated. The  interstitial markings are coarse. Patchy alveolar infiltrate previously demonstrated in the right lower lung is considerably less conspicuous. Subtle increased density remains however. Linear density in the right mid lung is slightly more conspicuous. Nipple shadows are visible bilaterally The heart and pulmonary vascularity are normal. The mediastinum is normal in width. There is faint calcification in the wall of the thoracic aorta. There is mild multilevel degenerative disc disease of the thoracic spine. IMPRESSION: Reactive airway disease. Ongoing improvement in patchy right lower lobe pneumonia. Slight interval increase conspicuity of right perihilar linear density in the mid lung likely reflects atelectasis. An additional follow-up chest x-ray in 2-3 weeks is recommended assuming the patient continues to improve clinically. Aortic atherosclerosis. Electronically Signed   By: Chinmay  Martinique M.D.   On: 11/10/2015 09:03   Assessment & Plan:   Peirce was seen today for hypertension and coronary artery disease.  Diagnoses and all orders for this visit:  Other cirrhosis of liver (  [place])- his PT/INR is in the normal range so he still has a normal hepatic reserve, screening for viral hepatitis is negative, this is most likely the sequela of fatty liver disease. -     Hepatitis C antibody; Future -     Hepatitis B surface antibody; Future -     Hepatitis B core antibody, total; Future -     Hepatitis A antibody, total; Future -     Protime-INR; Future  Coronary artery disease involving native coronary artery of native heart without angina pectoris- his EKG today shows concern for an old inferior infarct, will evaluate further with a Lexiscan -     EKG 12-Lead -     Myocardial Perfusion Imaging; Future  Leukocytosis- he appears systemically ill and has an elevated white cell count, will screen for bone marrow and lymphatic malignancy with flow cytometry. 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 He will consider stopping his steroid therapy. -     CK; Future -     Sedimentation rate; Future -     C-reactive protein; Future -     Acetylcholine receptor, blocking; Future  CAP (community acquired pneumonia)- based on symptoms, exam, and chest x-ray this has resolved. -     DG Chest 2 View; Future  Multiple lung nodules on CT- as above -     DG Chest 2 View; Future  FTT (failure to thrive) in adult- will start mirtazapine to see if this will improve his appetite, this may also help if he is depressed -     mirtazapine (REMERON) 7.5 MG tablet; Take 1 tablet (7.5 mg total) by mouth at bedtime.  Nonspecific abnormal electrocardiogram (ECG) (EKG)- I've ordered a Lexiscan to screen for ischemia and prior infarct. -     Myocardial Perfusion Imaging; Future  Need for prophylactic vaccination and inoculation against influenza -     Flu vaccine HIGH DOSE PF (Fluzone High dose)   I am having Mr. Coyt start on mirtazapine. I am also having him maintain his fluticasone, albuterol, montelukast, FLUTTER, Fluticasone-Salmeterol, BENICAR, omeprazole, AEROCHAMBER MV, and predniSONE.  Meds ordered this encounter  Medications  . mirtazapine (REMERON) 7.5 MG tablet    Sig: Take 1 tablet (7.5 mg total) by mouth at bedtime.    Dispense:  90 tablet    Refill:  3     Follow-up: Return in about 3 weeks (around 01/04/2016).  Scarlette Calico, MD

## 2015-12-14 NOTE — Patient Instructions (Signed)

## 2015-12-15 LAB — HEPATITIS B SURFACE ANTIBODY,QUALITATIVE: Hep B S Ab: NEGATIVE

## 2015-12-15 LAB — THYROID PANEL WITH TSH
FREE THYROXINE INDEX: 2.3 (ref 1.4–3.8)
T3 UPTAKE: 37 % — AB (ref 22–35)
T4, Total: 6.2 ug/dL (ref 4.5–12.0)
TSH: 3.29 m[IU]/L (ref 0.40–4.50)

## 2015-12-15 LAB — HEPATITIS C ANTIBODY: HCV Ab: NEGATIVE

## 2015-12-15 LAB — HEPATITIS A ANTIBODY, TOTAL: Hep A Total Ab: NONREACTIVE

## 2015-12-15 LAB — HEPATITIS B CORE ANTIBODY, TOTAL: Hep B Core Total Ab: NONREACTIVE

## 2015-12-16 ENCOUNTER — Other Ambulatory Visit: Payer: Self-pay

## 2015-12-16 LAB — ACETYLCHOLINE RECEPTOR, BLOCKING

## 2015-12-17 ENCOUNTER — Telehealth (HOSPITAL_COMMUNITY): Payer: Self-pay

## 2015-12-17 ENCOUNTER — Encounter: Payer: Self-pay | Admitting: Internal Medicine

## 2015-12-17 NOTE — Telephone Encounter (Signed)
Encounter complete. 

## 2015-12-21 ENCOUNTER — Telehealth (HOSPITAL_COMMUNITY): Payer: Self-pay

## 2015-12-21 ENCOUNTER — Other Ambulatory Visit: Payer: Self-pay | Admitting: Internal Medicine

## 2015-12-21 DIAGNOSIS — IMO0001 Reserved for inherently not codable concepts without codable children: Secondary | ICD-10-CM

## 2015-12-21 LAB — LEUKEMIA/LYMPHOMA EVALUATION PANEL
Number of Markers:: 24
VIABILITY: 96 %

## 2015-12-21 MED ORDER — TAPENTADOL HCL ER 50 MG PO TB12: 50.0000 mg | ORAL_TABLET | Freq: Two times a day (BID) | ORAL | 0 refills | Status: DC

## 2015-12-21 NOTE — Telephone Encounter (Signed)
Spoke to pt and informed rx is ready for pick up at the front.

## 2015-12-21 NOTE — Telephone Encounter (Signed)
Encounter complete. 

## 2015-12-22 ENCOUNTER — Encounter: Payer: Self-pay | Admitting: Internal Medicine

## 2015-12-22 ENCOUNTER — Ambulatory Visit (HOSPITAL_COMMUNITY)
Admission: RE | Admit: 2015-12-22 | Discharge: 2015-12-22 | Disposition: A | Discharge status: Home / Self Care | Payer: Medicare Other | Source: Ambulatory Visit | Attending: Cardiovascular Disease | Admitting: Cardiovascular Disease

## 2015-12-22 ENCOUNTER — Other Ambulatory Visit: Payer: Self-pay | Admitting: Internal Medicine

## 2015-12-22 ENCOUNTER — Inpatient Hospital Stay (HOSPITAL_COMMUNITY): Admission: RE | Admit: 2015-12-22 | Payer: Medicare Other | Source: Ambulatory Visit

## 2015-12-22 DIAGNOSIS — R0609 Other forms of dyspnea: Secondary | ICD-10-CM | POA: Diagnosis not present

## 2015-12-22 DIAGNOSIS — Z8249 Family history of ischemic heart disease and other diseases of the circulatory system: Secondary | ICD-10-CM | POA: Insufficient documentation

## 2015-12-22 DIAGNOSIS — Z87891 Personal history of nicotine dependence: Secondary | ICD-10-CM | POA: Diagnosis not present

## 2015-12-22 DIAGNOSIS — Z8673 Personal history of transient ischemic attack (TIA), and cerebral infarction without residual deficits: Secondary | ICD-10-CM | POA: Diagnosis not present

## 2015-12-22 DIAGNOSIS — R5383 Other fatigue: Secondary | ICD-10-CM | POA: Insufficient documentation

## 2015-12-22 DIAGNOSIS — I10 Essential (primary) hypertension: Secondary | ICD-10-CM | POA: Insufficient documentation

## 2015-12-22 DIAGNOSIS — I2510 Atherosclerotic heart disease of native coronary artery without angina pectoris: Secondary | ICD-10-CM | POA: Diagnosis not present

## 2015-12-22 DIAGNOSIS — R9439 Abnormal result of other cardiovascular function study: Secondary | ICD-10-CM | POA: Insufficient documentation

## 2015-12-22 DIAGNOSIS — R9431 Abnormal electrocardiogram [ECG] [EKG]: Secondary | ICD-10-CM | POA: Diagnosis not present

## 2015-12-22 LAB — MYOCARDIAL PERFUSION IMAGING
CHL CUP NUCLEAR SDS: 3
CHL CUP RESTING HR STRESS: 100 {beats}/min
CSEPPHR: 125 {beats}/min
LV dias vol: 55 mL (ref 62–150)
LV sys vol: 111 mL
SRS: 5
SSS: 8
TID: 1.04

## 2015-12-22 MED ORDER — REGADENOSON 0.4 MG/5ML IV SOLN
0.4000 mg | Freq: Once | INTRAVENOUS | Status: AC
  Administered 2015-12-22: 0.4 mg via INTRAVENOUS

## 2015-12-22 MED ORDER — TECHNETIUM TC 99M TETROFOSMIN IV KIT
31.0000 | PACK | Freq: Once | INTRAVENOUS | Status: AC | PRN
  Administered 2015-12-22: 31 via INTRAVENOUS
  Filled 2015-12-22: qty 31

## 2015-12-22 MED ORDER — TECHNETIUM TC 99M TETROFOSMIN IV KIT
10.4000 | PACK | Freq: Once | INTRAVENOUS | Status: AC | PRN
  Administered 2015-12-22: 10 via INTRAVENOUS
  Filled 2015-12-22: qty 10

## 2015-12-27 ENCOUNTER — Telehealth: Payer: Self-pay | Admitting: Internal Medicine

## 2015-12-27 DIAGNOSIS — J9620 Acute and chronic respiratory failure, unspecified whether with hypoxia or hypercapnia: Secondary | ICD-10-CM

## 2015-12-27 NOTE — Telephone Encounter (Signed)
Ok to order [facility] room air  Dx chronic respiratory failure with hypoxia

## 2015-12-27 NOTE — Telephone Encounter (Signed)
Per Dr. Annamaria Boots:  Qualifies for Sleep O2 - 2L Dx: Chronic Bronchiectasis; Respiratory Failure with Hypoxia  Patient states he is already using o2.  States that he was supposed to get another ONO done soon, but has not had one scheduled.  Do not seen an order for another ONO test.   Dr. Annamaria Boots, please advise if patient is supposed to have another ONO test done before his OV in October.

## 2015-12-27 NOTE — Telephone Encounter (Signed)
Called spoke with pt. Reviewed recs. He voiced understanding and had no further questions. Order placed. nothing further needed.

## 2015-12-30 ENCOUNTER — Other Ambulatory Visit (INDEPENDENT_AMBULATORY_CARE_PROVIDER_SITE_OTHER): Payer: Medicare Other

## 2015-12-30 ENCOUNTER — Ambulatory Visit (INDEPENDENT_AMBULATORY_CARE_PROVIDER_SITE_OTHER): Payer: Medicare Other | Admitting: Nurse Practitioner

## 2015-12-30 ENCOUNTER — Encounter: Payer: Self-pay | Admitting: Nurse Practitioner

## 2015-12-30 VITALS — BP 128/74 | HR 112 | Temp 98.1°F | Ht 73.0 in | Wt 155.1 lb

## 2015-12-30 DIAGNOSIS — R634 Abnormal weight loss: Secondary | ICD-10-CM | POA: Diagnosis not present

## 2015-12-30 DIAGNOSIS — K7460 Unspecified cirrhosis of liver: Secondary | ICD-10-CM

## 2015-12-30 DIAGNOSIS — M791 Myalgia: Secondary | ICD-10-CM

## 2015-12-30 DIAGNOSIS — R630 Anorexia: Secondary | ICD-10-CM | POA: Diagnosis not present

## 2015-12-30 DIAGNOSIS — Z92241 Personal history of systemic steroid therapy: Secondary | ICD-10-CM

## 2015-12-30 DIAGNOSIS — R531 Weakness: Secondary | ICD-10-CM

## 2015-12-30 DIAGNOSIS — M609 Myositis, unspecified: Secondary | ICD-10-CM

## 2015-12-30 DIAGNOSIS — IMO0001 Reserved for inherently not codable concepts without codable children: Secondary | ICD-10-CM

## 2015-12-30 DIAGNOSIS — I2510 Atherosclerotic heart disease of native coronary artery without angina pectoris: Secondary | ICD-10-CM | POA: Diagnosis not present

## 2015-12-30 LAB — COMPREHENSIVE METABOLIC PANEL
ALT: 11 U/L (ref 0–53)
AST: 20 U/L (ref 0–37)
Albumin: 3 g/dL — ABNORMAL LOW (ref 3.5–5.2)
Alkaline Phosphatase: 145 U/L — ABNORMAL HIGH (ref 39–117)
BUN: 19 mg/dL (ref 6–23)
CO2: 32 meq/L (ref 19–32)
Calcium: 9 mg/dL (ref 8.4–10.5)
Chloride: 98 mEq/L (ref 96–112)
Creatinine, Ser: 0.71 mg/dL (ref 0.40–1.50)
GFR: 115.88 mL/min (ref >60.00)
GLUCOSE: 88 mg/dL (ref 70–99)
POTASSIUM: 4.6 meq/L (ref 3.5–5.1)
Sodium: 136 mEq/L (ref 135–145)
Total Bilirubin: 0.4 mg/dL (ref 0.2–1.2)
Total Protein: 7.5 g/dL (ref 6.0–8.3)

## 2015-12-30 NOTE — Assessment & Plan Note (Signed)
Start boost intake 3 times a day. Encourage small frequent meals.

## 2015-12-30 NOTE — Assessment & Plan Note (Signed)
CT abdomen and pelvis with contrast ordered today. Alpha-fetoprotein ordered today. Elevated ALP 145 12/30/15

## 2015-12-30 NOTE — Patient Instructions (Signed)
Increase boost intake to 3 times a day. Go downstairs for labs. Will be called for CT abdomen and pelvis appt.

## 2015-12-30 NOTE — Assessment & Plan Note (Signed)
Oral prednisone discontinued today

## 2015-12-30 NOTE — Assessment & Plan Note (Signed)
Leukemia panel indicates eosinophilia. Patient has history of asthma and environmental allergies. Chronic use of oral prednisone, which was discontinued today.

## 2015-12-30 NOTE — Progress Notes (Signed)
Pre visit review using our clinic review tool, if applicable. No additional management support is needed unless otherwise documented below in the visit note. 

## 2015-12-30 NOTE — Assessment & Plan Note (Signed)
Declined use of Nucynta, due to fear of increasing sedation.

## 2015-12-30 NOTE — Progress Notes (Signed)
Subjective:  Patient ID: Lee Blevins, male    DOB: 02-May-1945  Age: 70 y.o. MRN: [id number]  CC: Fatigue (Pt has been experiencing fatigue for the last 6 months (since pt was dx with pneumonia). Pt has also had an excessive amount of eosinophils for the last few months as well. ) and Anorexia (Pt has had a decrease in appetite for as long as the fatigue has been present. ) In exam room with wife.  HPI Fatigue and Abnormal Weight Loss: Lee Blevins presents with persistent loss of appetite, weight loss, and fatigue. He noticed clothes are fitting loosely. States food does not taste the same. Started drinking one bottle of boost a day. Denies any abdominal pain, nausea, vomiting, diarrhea, constipation, dysuria, urgency, urinary retention or fever. Started use of Remeron, which has caused increased urinary frequency. Did not take Nucynta as prescribed due to possible increased sedation. Also states his pain is not that bad. Denies any recent fall. Denies any alcohol use. Denies any suspicious skin lesions. Last colonoscopy was 2003 (normal). Denies family history of prostate cancer.  Outpatient Medications Prior to Visit  Medication Sig Dispense Refill  . albuterol (PROVENTIL HFA;VENTOLIN HFA) 108 (90 Base) MCG/ACT inhaler Inhale 2 puffs into the lungs every 6 (six) hours as needed for wheezing or shortness of breath. 1 Inhaler 11  . BENICAR 20 MG tablet TAKE 1 TABLET BY MOUTH EVERY DAY 90 tablet 1  . fluticasone (FLONASE) 50 MCG/ACT nasal spray USE 1 SPRAY IN EACH NOSTRIL TWICE DAILY 48 g 3  . Fluticasone-Salmeterol (ADVAIR) 250-50 MCG/DOSE AEPB Inhale 1 puff into the lungs 2 (two) times daily.    . montelukast (SINGULAIR) 10 MG tablet Take 1 tablet by mouth at  bedtime 90 tablet 1  . omeprazole (PRILOSEC) 40 MG capsule Take 1 capsule (40 mg total) by mouth daily. 30 capsule 6  . Respiratory Therapy Supplies (FLUTTER) DEVI Blow through 4 times per set, Three sets per day 1 each 0  .  Spacer/Aero-Holding Chambers (AEROCHAMBER MV) inhaler Use as instructed 1 each 0  . mirtazapine (REMERON) 7.5 MG tablet Take 1 tablet (7.5 mg total) by mouth at bedtime. 90 tablet 3  . predniSONE (DELTASONE) 5 MG tablet 1 every other day 30 tablet 5  . tapentadol (NUCYNTA ER) 50 MG 12 hr tablet Take 1 tablet (50 mg total) by mouth every 12 (twelve) hours. 60 tablet 0   No facility-administered medications prior to visit.     ROS See HPI  CT ABD/Pelvis with contrast: no acute finding, no comment of liver cirrhosis. CMP normal except elevated ALP and decrease albumin. AFP negative.  Objective:  BP 128/74 (BP Location: Left Arm, Patient Position: Sitting, Cuff Size: Normal)   Pulse (!) 112   Temp 98.1 F (36.7 C)   Ht 6\' 1"  (1.854 m)   Wt 155 lb 1.3 oz (70.3 kg)   SpO2 98%   BMI 20.46 kg/m   BP Readings from Last 3 Encounters:  12/30/15 128/74  12/14/15 120/80  11/10/15 126/72    Wt Readings from Last 3 Encounters:  12/30/15 155 lb 1.3 oz (70.3 kg)  12/22/15 165 lb (74.8 kg)  12/14/15 165 lb 8 oz (75.1 kg)    Physical Exam  Constitutional: He is oriented to person, place, and time. No distress.  HENT:  Right Ear: External ear normal.  Left Ear: External ear normal.  Mouth/Throat: Oropharynx is clear and moist.  Eyes: Conjunctivae and EOM are normal. Pupils are equal, round,  and reactive to light.  Cardiovascular: Normal rate and normal heart sounds.   Pulmonary/Chest: Effort normal and breath sounds normal.  Abdominal: Soft. Bowel sounds are normal.  Musculoskeletal: He exhibits no edema or tenderness.  Lymphadenopathy:    He has no cervical adenopathy.  Neurological: He is alert and oriented to person, place, and time.  Skin: Skin is warm and dry. No rash noted. No erythema.  Vitals reviewed.   Lab Results  Component Value Date   WBC 13.8 (H) 12/14/2015   HGB 13.4 12/14/2015   HCT 39.4 12/14/2015   PLT 414.0 (H) 12/14/2015   GLUCOSE 88 12/30/2015   CHOL  235 (H) 06/08/2015   TRIG 75.0 06/08/2015   HDL 89.50 06/08/2015   LDLDIRECT 160.0 06/13/2013   LDLCALC 131 (H) 06/08/2015   ALT 11 12/30/2015   AST 20 12/30/2015   NA 136 12/30/2015   K 4.6 12/30/2015   CL 98 12/30/2015   CREATININE 0.71 12/30/2015   BUN 19 12/30/2015   CO2 32 12/30/2015   TSH 3.29 12/14/2015   PSA 0.66 06/08/2015   INR 1.1 (H) 12/14/2015   HGBA1C 5.5 06/08/2015   CMP Latest Ref Rng & Units 12/30/2015 09/24/2015 09/20/2015  Glucose 70 - 99 mg/dL 88 - 96  BUN 6 - 23 mg/dL 19 - 11  Creatinine 0.40 - 1.50 mg/dL 0.71 - 0.87  Sodium 135 - 145 mEq/L 136 - 136  Potassium 3.5 - 5.1 mEq/L 4.6 - 4.2  Chloride 96 - 112 mEq/L 98 - 101  CO2 19 - 32 mEq/L 32 - 29  Calcium 8.4 - 10.5 mg/dL 9.0 - 9.9  Total Protein 6.0 - 8.3 g/dL 7.5 8.2 -  Total Bilirubin 0.2 - 1.2 mg/dL 0.4 0.9 -  Alkaline Phos 39 - 117 U/L 145(H) 88 -  AST 0 - 37 U/L 20 21 -  ALT 0 - 53 U/L 11 11 -   Assessment & Plan:  Reviewed previous labs Collaborated with Dr. Sharlet Salina.  Lee Blevins was seen today for fatigue and anorexia.  Diagnoses and all orders for this visit:  Abnormal weight loss -     CT Abdomen Pelvis W Contrast; Future -     Comprehensive metabolic panel; Future -     AFP tumor marker; Future  Myalgia and myositis -     CT Abdomen Pelvis W Contrast; Future -     Comprehensive metabolic panel; Future -     AFP tumor marker; Future  Anorexia -     CT Abdomen Pelvis W Contrast; Future -     Comprehensive metabolic panel; Future -     AFP tumor marker; Future  Cirrhosis of liver without ascites, unspecified hepatic cirrhosis type (HCC) -     CT Abdomen Pelvis W Contrast; Future -     AFP tumor marker; Future  History of steroid therapy   I have discontinued Lee Blevins's predniSONE, mirtazapine, and tapentadol. I am also having him maintain his fluticasone, albuterol, montelukast, FLUTTER, Fluticasone-Salmeterol, BENICAR, omeprazole, and AEROCHAMBER MV.  No orders of the defined  types were placed in this encounter.   Follow-up: Return if symptoms worsen or fail to improve.  Wilfred Lacy, NP

## 2015-12-30 NOTE — Assessment & Plan Note (Signed)
10 pound weight loss noted in the last 2 weeks. Ordered CT abdomen and pelvis with contrast. Repeat CMP. Ordered alpha-fetoprotein.

## 2015-12-30 NOTE — Assessment & Plan Note (Signed)
Symptoms continued to worsen. No improvement with Remeron.  Remeron discontinued due to increased urinary frequency.

## 2015-12-31 ENCOUNTER — Ambulatory Visit (INDEPENDENT_AMBULATORY_CARE_PROVIDER_SITE_OTHER)
Admission: RE | Admit: 2015-12-31 | Discharge: 2015-12-31 | Disposition: A | Discharge status: Home / Self Care | Payer: Medicare Other | Source: Ambulatory Visit | Attending: Nurse Practitioner | Admitting: Nurse Practitioner

## 2015-12-31 ENCOUNTER — Ambulatory Visit: Payer: 59 | Admitting: Internal Medicine

## 2015-12-31 DIAGNOSIS — R634 Abnormal weight loss: Secondary | ICD-10-CM | POA: Diagnosis not present

## 2015-12-31 DIAGNOSIS — M791 Myalgia: Secondary | ICD-10-CM

## 2015-12-31 DIAGNOSIS — K7460 Unspecified cirrhosis of liver: Secondary | ICD-10-CM

## 2015-12-31 DIAGNOSIS — R630 Anorexia: Secondary | ICD-10-CM

## 2015-12-31 DIAGNOSIS — M609 Myositis, unspecified: Secondary | ICD-10-CM

## 2015-12-31 DIAGNOSIS — IMO0001 Reserved for inherently not codable concepts without codable children: Secondary | ICD-10-CM

## 2015-12-31 LAB — AFP TUMOR MARKER: AFP TUMOR MARKER: 2.1 ng/mL (ref <6.1)

## 2015-12-31 MED ORDER — IOPAMIDOL (ISOVUE-300) INJECTION 61%
100.0000 mL | Freq: Once | INTRAVENOUS | Status: AC | PRN
  Administered 2015-12-31: 100 mL via INTRAVENOUS

## 2015-12-31 NOTE — Progress Notes (Signed)
Normal results, see office note

## 2016-01-03 ENCOUNTER — Telehealth: Payer: Self-pay | Admitting: Internal Medicine

## 2016-01-03 NOTE — Telephone Encounter (Signed)
Pt's spouse,Lee Blevins, request to speak to the assistant concern about Lee Blevins condition. He continue to loss weight and can not eat. She is not sure what to do at this point bc his BP was 105-56 and HR 65. Please call her ASAP if we can today.

## 2016-01-04 ENCOUNTER — Telehealth: Payer: Self-pay | Admitting: Internal Medicine

## 2016-01-04 ENCOUNTER — Other Ambulatory Visit: Payer: Self-pay | Admitting: Nurse Practitioner

## 2016-01-04 DIAGNOSIS — R630 Anorexia: Secondary | ICD-10-CM

## 2016-01-04 DIAGNOSIS — J82 Pulmonary eosinophilia, not elsewhere classified: Secondary | ICD-10-CM | POA: Diagnosis not present

## 2016-01-04 DIAGNOSIS — D473 Essential (hemorrhagic) thrombocythemia: Secondary | ICD-10-CM | POA: Diagnosis not present

## 2016-01-04 DIAGNOSIS — R634 Abnormal weight loss: Secondary | ICD-10-CM | POA: Diagnosis not present

## 2016-01-04 DIAGNOSIS — R918 Other nonspecific abnormal finding of lung field: Secondary | ICD-10-CM | POA: Diagnosis not present

## 2016-01-04 DIAGNOSIS — R700 Elevated erythrocyte sedimentation rate: Secondary | ICD-10-CM | POA: Diagnosis not present

## 2016-01-04 DIAGNOSIS — M2550 Pain in unspecified joint: Secondary | ICD-10-CM | POA: Diagnosis not present

## 2016-01-04 DIAGNOSIS — J4541 Moderate persistent asthma with (acute) exacerbation: Secondary | ICD-10-CM | POA: Diagnosis not present

## 2016-01-04 DIAGNOSIS — R51 Headache: Secondary | ICD-10-CM | POA: Diagnosis not present

## 2016-01-04 MED ORDER — MEGESTROL ACETATE 625 MG/5ML PO SUSP: 625.0000 mg | Freq: Every day | ORAL | 1 refills | Status: DC

## 2016-01-04 NOTE — Telephone Encounter (Signed)
Please advise what pt and spouse options are?

## 2016-01-04 NOTE — Telephone Encounter (Signed)
In the absence of dysphagia, abdominal pain; Mrs. Packwood should continue encouraging Lee Blevins with small frequent meals and fluids. Continue with 3 servings of boost a day. Will send prescription for Megace to help with appetite. Maintain upcoming appointment with Dr. [name].  Thank you

## 2016-01-04 NOTE — Telephone Encounter (Signed)
lmtcb x1 for pt's wife. 

## 2016-01-04 NOTE — Telephone Encounter (Signed)
Pt spouse informed of provider advise and rx sent to pharmacy. Spouse is still very concerned with pt condition. Appt with PCP on 12/11/15

## 2016-01-05 ENCOUNTER — Telehealth: Payer: Self-pay | Admitting: Internal Medicine

## 2016-01-05 NOTE — Telephone Encounter (Signed)
Yes it was ordered through [facility] give him their # [id number] Sally E Ottinger

## 2016-01-05 NOTE — Telephone Encounter (Signed)
PCC's please advise if this test has been ordered for the pt.  thanks

## 2016-01-05 NOTE — Telephone Encounter (Signed)
lmomtcb x1 

## 2016-01-06 ENCOUNTER — Encounter: Payer: Self-pay | Admitting: Internal Medicine

## 2016-01-06 NOTE — Telephone Encounter (Signed)
Aware that [facility] was trying to reach him to let him know that [facility] will be contacting him about scheduling his ONO Yes it was ordered through [facility] give him their # [id number] Lee Blevins Patient to call [facility] to schedule. Nothing further needed.

## 2016-01-11 ENCOUNTER — Encounter: Payer: Self-pay | Admitting: Internal Medicine

## 2016-01-11 ENCOUNTER — Ambulatory Visit (INDEPENDENT_AMBULATORY_CARE_PROVIDER_SITE_OTHER): Payer: Medicare Other | Admitting: Internal Medicine

## 2016-01-11 VITALS — BP 100/62 | HR 70 | Temp 97.6°F | Resp 16 | Ht 73.0 in | Wt 153.8 lb

## 2016-01-11 DIAGNOSIS — IMO0001 Reserved for inherently not codable concepts without codable children: Secondary | ICD-10-CM

## 2016-01-11 DIAGNOSIS — I2510 Atherosclerotic heart disease of native coronary artery without angina pectoris: Secondary | ICD-10-CM | POA: Diagnosis not present

## 2016-01-11 DIAGNOSIS — R627 Adult failure to thrive: Secondary | ICD-10-CM

## 2016-01-11 DIAGNOSIS — R531 Weakness: Secondary | ICD-10-CM

## 2016-01-11 DIAGNOSIS — M609 Myositis, unspecified: Secondary | ICD-10-CM

## 2016-01-11 DIAGNOSIS — I10 Essential (primary) hypertension: Secondary | ICD-10-CM

## 2016-01-11 DIAGNOSIS — R634 Abnormal weight loss: Secondary | ICD-10-CM

## 2016-01-11 DIAGNOSIS — M791 Myalgia: Secondary | ICD-10-CM | POA: Diagnosis not present

## 2016-01-11 MED ORDER — MIRTAZAPINE 15 MG PO TBDP: 15.0000 mg | ORAL_TABLET | Freq: Every day | ORAL | 1 refills | Status: DC

## 2016-01-11 MED ORDER — DRONABINOL 2.5 MG PO CAPS: 2.5000 mg | ORAL_CAPSULE | Freq: Two times a day (BID) | ORAL | 1 refills | Status: DC

## 2016-01-11 NOTE — Patient Instructions (Signed)
Hypertension Hypertension, commonly called high blood pressure, is when the force of blood pumping through your arteries is too strong. Your arteries are the blood vessels that carry blood from your heart throughout your body. A blood pressure reading consists of a higher number over a lower number, such as 110/72. The higher number (systolic) is the pressure inside your arteries when your heart pumps. The lower number (diastolic) is the pressure inside your arteries when your heart relaxes. Ideally you want your blood pressure below 120/80. Hypertension forces your heart to work harder to pump blood. Your arteries may become narrow or stiff. Having untreated or uncontrolled hypertension can cause heart attack, stroke, kidney disease, and other problems. RISK FACTORS Some risk factors for high blood pressure are controllable. Others are not.  Risk factors you cannot control include:   Race. You may be at higher risk if you are African American.  Age. Risk increases with age.  Gender. Men are at higher risk than women before age 45 years. After age 65, women are at higher risk than men. Risk factors you can control include:  Not getting enough exercise or physical activity.  Being overweight.  Getting too much fat, sugar, calories, or salt in your diet.  Drinking too much alcohol. SIGNS AND SYMPTOMS Hypertension does not usually cause signs or symptoms. Extremely high blood pressure (hypertensive crisis) may cause headache, anxiety, shortness of breath, and nosebleed. DIAGNOSIS To check if you have hypertension, your health care provider will measure your blood pressure while you are seated, with your arm held at the level of your heart. It should be measured at least twice using the same arm. Certain conditions can cause a difference in blood pressure between your right and left arms. A blood pressure reading that is higher than normal on one occasion does not mean that you need treatment. If  it is not clear whether you have high blood pressure, you may be asked to return on a different day to have your blood pressure checked again. Or, you may be asked to monitor your blood pressure at home for 1 or more weeks. TREATMENT Treating high blood pressure includes making lifestyle changes and possibly taking medicine. Living a healthy lifestyle can help lower high blood pressure. You may need to change some of your habits. Lifestyle changes may include:  Following the DASH diet. This diet is high in fruits, vegetables, and whole grains. It is low in salt, red meat, and added sugars.  Keep your sodium intake below 2,300 mg per day.  Getting at least 30-45 minutes of aerobic exercise at least 4 times per week.  Losing weight if necessary.  Not smoking.  Limiting alcoholic beverages.  Learning ways to reduce stress. Your health care provider may prescribe medicine if lifestyle changes are not enough to get your blood pressure under control, and if one of the following is true:  You are 18-59 years of age and your systolic blood pressure is above 140.  You are 60 years of age or older, and your systolic blood pressure is above 150.  Your diastolic blood pressure is above 90.  You have diabetes, and your systolic blood pressure is over 140 or your diastolic blood pressure is over 90.  You have kidney disease and your blood pressure is above 140/90.  You have heart disease and your blood pressure is above 140/90. Your personal target blood pressure may vary depending on your medical conditions, your age, and other factors. HOME CARE INSTRUCTIONS    Have your blood pressure rechecked as directed by your health care provider.   Take medicines only as directed by your health care provider. Follow the directions carefully. Blood pressure medicines must be taken as prescribed. The medicine does not work as well when you skip doses. Skipping doses also puts you at risk for  problems.  Do not smoke.   Monitor your blood pressure at home as directed by your health care provider. SEEK MEDICAL CARE IF:   You think you are having a reaction to medicines taken.  You have recurrent headaches or feel dizzy.  You have swelling in your ankles.  You have trouble with your vision. SEEK IMMEDIATE MEDICAL CARE IF:  You develop a severe headache or confusion.  You have unusual weakness, numbness, or feel faint.  You have severe chest or abdominal pain.  You vomit repeatedly.  You have trouble breathing. MAKE SURE YOU:   Understand these instructions.  Will watch your condition.  Will get help right away if you are not doing well or get worse.   This information is not intended to replace advice given to you by your health care provider. Make sure you discuss any questions you have with your health care provider.   Document Released: 04/03/2005 Document Revised: 08/18/2014 Document Reviewed: 01/24/2013 Elsevier Interactive Patient Education 2016 [employer]  

## 2016-01-11 NOTE — Progress Notes (Signed)
Subjective:  Patient ID: Lee Blevins, male    DOB: [birth year]  Age: 70 y.o. MRN: [id number]  CC: Hypertension   HPI Lee Blevins presents for follow-up. Since I last saw him he was seen here again for ongoing concerns about loss of appetite and weight loss. Another provider had recommended that he take Megace for appetite improvement but his wife was concerned about interactions with prednisone so he never took the Megace. He also stopped taking mirtazapine at 7.5 mg once a day after week or 2 because he didn't think it was helping much. Since I last saw him he has also seen a rheumatologist and is in the process of having a thorough evaluation. He has a follow-up with the rheumatologist one day after this visit. He complains of persistent weight loss, maybe about 2-3 pounds since I last saw him with poor appetite and lack of interest with food. He has fatigue, muscle aches, and weakness. He has had a few episodes of numbness in both hands but no numbness or tingling elsewhere.  Outpatient Medications Prior to Visit  Medication Sig Dispense Refill  . albuterol (PROVENTIL HFA;VENTOLIN HFA) 108 (90 Base) MCG/ACT inhaler Inhale 2 puffs into the lungs every 6 (six) hours as needed for wheezing or shortness of breath. 1 Inhaler 11  . fluticasone (FLONASE) 50 MCG/ACT nasal spray USE 1 SPRAY IN EACH NOSTRIL TWICE DAILY 48 g 3  . Fluticasone-Salmeterol (ADVAIR) 250-50 MCG/DOSE AEPB Inhale 1 puff into the lungs 2 (two) times daily.    . montelukast (SINGULAIR) 10 MG tablet Take 1 tablet by mouth at  bedtime 90 tablet 1  . omeprazole (PRILOSEC) 40 MG capsule Take 1 capsule (40 mg total) by mouth daily. 30 capsule 6  . Respiratory Therapy Supplies (FLUTTER) DEVI Blow through 4 times per set, Three sets per day 1 each 0  . Spacer/Aero-Holding Chambers (AEROCHAMBER MV) inhaler Use as instructed 1 each 0  . BENICAR 20 MG tablet TAKE 1 TABLET BY MOUTH EVERY DAY 90 tablet 1  . megestrol (MEGACE ES) 625 MG/5ML  suspension Take 5 mLs (625 mg total) by mouth daily. (Patient not taking: Reported on 01/11/2016) 150 mL 1   No facility-administered medications prior to visit.     ROS Review of Systems  Constitutional: Positive for appetite change, fatigue and unexpected weight change. Negative for activity change, chills, diaphoresis and fever.  HENT: Negative.  Negative for trouble swallowing.   Eyes: Negative.  Negative for photophobia and visual disturbance.  Respiratory: Negative for cough, choking, chest tightness, shortness of breath and stridor.   Cardiovascular: Negative for chest pain, palpitations and leg swelling.  Gastrointestinal: Negative.  Negative for abdominal pain, constipation, diarrhea, nausea and vomiting.  Endocrine: Negative.   Genitourinary: Negative.  Negative for difficulty urinating.  Musculoskeletal: Positive for myalgias. Negative for arthralgias, back pain and neck pain.  Skin: Negative.  Negative for color change and rash.  Allergic/Immunologic: Negative.   Neurological: Positive for dizziness, weakness, light-headedness and numbness. Negative for tremors, seizures, syncope, facial asymmetry, speech difficulty and headaches.  Hematological: Negative.  Negative for adenopathy. Does not bruise/bleed easily.  Psychiatric/Behavioral: Positive for sleep disturbance.    Objective:  BP 100/62 (BP Location: Left Arm, Patient Position: Sitting, Cuff Size: Normal)   Pulse 70   Temp 97.6 F (36.4 C) (Oral)   Ht 6\' 1"  (1.854 m)   Wt 153 lb 12 oz (69.7 kg)   SpO2 97%   BMI 20.28 kg/m   BP Readings from  Last 3 Encounters:  01/11/16 100/62  12/30/15 128/74  12/14/15 120/80    Wt Readings from Last 3 Encounters:  01/11/16 153 lb 12 oz (69.7 kg)  12/30/15 155 lb 1.3 oz (70.3 kg)  12/22/15 165 lb (74.8 kg)    Physical Exam  Constitutional: He is oriented to person, place, and time. No distress.  HENT:  Mouth/Throat: Oropharynx is clear and moist. No oropharyngeal  exudate.  Eyes: Conjunctivae are normal. Right eye exhibits no discharge. Left eye exhibits no discharge. No scleral icterus.  Neck: Normal range of motion. Neck supple. No JVD present. No tracheal deviation present. No thyromegaly present.  Cardiovascular: Normal rate, regular rhythm, normal heart sounds and intact distal pulses.  Exam reveals no gallop and no friction rub.   No murmur heard. Pulmonary/Chest: Effort normal and breath sounds normal. No stridor. No respiratory distress. He has no wheezes. He has no rales. He exhibits no tenderness.  Abdominal: Soft. Bowel sounds are normal. He exhibits no distension and no mass. There is no tenderness. There is no rebound and no guarding.  Musculoskeletal: Normal range of motion. He exhibits no edema, tenderness or deformity.  Lymphadenopathy:    He has no cervical adenopathy.  Neurological: He is oriented to person, place, and time.  Skin: Skin is warm and dry. No rash noted. He is not diaphoretic. No erythema. No pallor.  Psychiatric: He has a normal mood and affect. His behavior is normal. Judgment and thought content normal.  Vitals reviewed.   Lab Results  Component Value Date   WBC 13.8 (H) 12/14/2015   HGB 13.4 12/14/2015   HCT 39.4 12/14/2015   PLT 414.0 (H) 12/14/2015   GLUCOSE 88 12/30/2015   CHOL 235 (H) 06/08/2015   TRIG 75.0 06/08/2015   HDL 89.50 06/08/2015   LDLDIRECT 160.0 06/13/2013   LDLCALC 131 (H) 06/08/2015   ALT 11 12/30/2015   AST 20 12/30/2015   NA 136 12/30/2015   K 4.6 12/30/2015   CL 98 12/30/2015   CREATININE 0.71 12/30/2015   BUN 19 12/30/2015   CO2 32 12/30/2015   TSH 3.29 12/14/2015   PSA 0.66 06/08/2015   INR 1.1 (H) 12/14/2015   HGBA1C 5.5 06/08/2015    Ct Abdomen Pelvis W Contrast  Result Date: 12/31/2015 CLINICAL DATA:  20 pound weight loss.  No appetite.  Fatigue. EXAM: CT ABDOMEN AND PELVIS WITH CONTRAST TECHNIQUE: Multidetector CT imaging of the abdomen and pelvis was performed using  the standard protocol following bolus administration of intravenous contrast. CONTRAST:  137mL ISOVUE-300 IOPAMIDOL (ISOVUE-300) INJECTION 61% COMPARISON:  None. FINDINGS: Lower chest: Lung bases are clear.  Coronary artery atherosclerosis. Hepatobiliary: No focal liver abnormality is seen. No gallstones, gallbladder wall thickening, or biliary dilatation. Pancreas: Atrophic pancreas. No peripancreatic inflammatory changes. No pancreatic mass. Spleen: Normal in size without focal abnormality. Adrenals/Urinary Tract: Normal adrenal glands. 2.1 cm hypodense, fluid attenuating right renal mass most consistent with a cyst. Kidneys are otherwise normal. No urolithiasis or obstructive uropathy. Normal bladder. Stomach/Bowel: Stomach is within normal limits. No evidence of bowel wall thickening, distention, or inflammatory changes. Appendix is not visualized. Vascular/Lymphatic: Aortic atherosclerosis. No enlarged abdominal or pelvic lymph nodes. Reproductive: Prostate is unremarkable. Other: No abdominal wall hernia or abnormality. No abdominopelvic ascites. Musculoskeletal: No lytic or sclerotic osseous lesion. 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 Mild osteoarthritis of the left hip. 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  Bilateral facet arthropathy at L4-5 resulting in minimal grade 1 anterolisthesis of L4 on L5. IMPRESSION: 1. No acute abdominal or pelvic pathology. 2.  Aortic Atherosclerosis (ICD10-170.0) Electronically Signed   By: Kathreen Devoid   On: 12/31/2015 09:14    Assessment & Plan:   Jeremaih was seen today for hypertension.  Diagnoses and all orders for this visit:  Myalgia and myositis- I will await the results of his recent rheumatological workup.  Weakness generalized- as above  FTT (failure to thrive) in adult- I think he would benefit from taking Marinol, will start at a low  dose and increase the dose as indicated. I also think he would benefit from taking a higher dose of mirtazapine so I have asked him to increase the dose from 7.5 to 15 mg at bedtime. This will hopefully help improve his appetite. -     dronabinol (MARINOL) 2.5 MG capsule; Take 1 capsule (2.5 mg total) by mouth 2 (two) times daily before lunch and supper. -     mirtazapine (REMERON SOL-TAB) 15 MG disintegrating tablet; Take 1 tablet (15 mg total) by mouth at bedtime.  Abnormal weight loss- extensive workup thus far has been negative for a metabolic, infectious, or malignant cause. I await the results of his rheumatological evaluation. In the meantime I think he would benefit from taking Marinol and restarting mirtazapine. -     dronabinol (MARINOL) 2.5 MG capsule; Take 1 capsule (2.5 mg total) by mouth 2 (two) times daily before lunch and supper. -     mirtazapine (REMERON SOL-TAB) 15 MG disintegrating tablet; Take 1 tablet (15 mg total) by mouth at bedtime.   I have discontinued Mr. Gearlds BENICAR, megestrol, and mirtazapine. I am also having him start on dronabinol and mirtazapine. Additionally, I am having him maintain his fluticasone, albuterol, montelukast, FLUTTER, Fluticasone-Salmeterol, omeprazole, AEROCHAMBER MV, and predniSONE.  Meds ordered this encounter  Medications  . DISCONTD: mirtazapine (REMERON) 7.5 MG tablet    Sig: Take 7.5 mg by mouth at bedtime.    Refill:  3  . predniSONE (DELTASONE) 20 MG tablet    Sig: Take 40 mg by mouth every morning.    Refill:  0  . dronabinol (MARINOL) 2.5 MG capsule    Sig: Take 1 capsule (2.5 mg total) by mouth 2 (two) times daily before lunch and supper.    Dispense:  60 capsule    Refill:  1  . mirtazapine (REMERON SOL-TAB) 15 MG disintegrating tablet    Sig: Take 1 tablet (15 mg total) by mouth at bedtime.    Dispense:  90 tablet    Refill:  1     Follow-up: Return in about 2 months (around 03/12/2016).  Scarlette Calico, MD

## 2016-01-11 NOTE — Assessment & Plan Note (Signed)
His systolic blood pressure is down to 100 and he is symptomatic. I have asked him to stop taking the ARB.

## 2016-01-11 NOTE — Progress Notes (Signed)
Pre visit review using our clinic review tool, if applicable. No additional management support is needed unless otherwise documented below in the visit note. 

## 2016-01-12 DIAGNOSIS — M2550 Pain in unspecified joint: Secondary | ICD-10-CM | POA: Diagnosis not present

## 2016-01-12 DIAGNOSIS — Z79899 Other long term (current) drug therapy: Secondary | ICD-10-CM | POA: Diagnosis not present

## 2016-01-12 DIAGNOSIS — D721 Eosinophilia: Secondary | ICD-10-CM | POA: Diagnosis not present

## 2016-01-12 DIAGNOSIS — R918 Other nonspecific abnormal finding of lung field: Secondary | ICD-10-CM | POA: Diagnosis not present

## 2016-01-12 DIAGNOSIS — R700 Elevated erythrocyte sedimentation rate: Secondary | ICD-10-CM | POA: Diagnosis not present

## 2016-01-21 ENCOUNTER — Encounter: Payer: Self-pay | Admitting: Internal Medicine

## 2016-01-21 ENCOUNTER — Ambulatory Visit (INDEPENDENT_AMBULATORY_CARE_PROVIDER_SITE_OTHER): Payer: Medicare Other | Admitting: Internal Medicine

## 2016-01-21 VITALS — BP 124/64 | HR 77 | Ht 73.0 in | Wt 158.8 lb

## 2016-01-21 DIAGNOSIS — I429 Cardiomyopathy, unspecified: Secondary | ICD-10-CM | POA: Diagnosis not present

## 2016-01-21 DIAGNOSIS — I2510 Atherosclerotic heart disease of native coronary artery without angina pectoris: Secondary | ICD-10-CM

## 2016-01-21 DIAGNOSIS — R9439 Abnormal result of other cardiovascular function study: Secondary | ICD-10-CM | POA: Diagnosis not present

## 2016-01-21 DIAGNOSIS — R0602 Shortness of breath: Secondary | ICD-10-CM | POA: Diagnosis not present

## 2016-01-21 NOTE — Progress Notes (Signed)
New Outpatient Visit Date: 01/21/2016  Referring Provider: Janith Lima, MD [address],  [address]  Chief Complaint: Abnormal EKG and stress test  HPI:  Lee Blevins is a 70 y.o. year-old male with history of hemorrhagic stroke (2011), hypertension, hyperlipidemia, and asthma, who has been referred by Dr. [name] for evaluation of abnormal EKG and stress test. The patient reports that he was in good health until early May of this year, when he developed progressive shortness of breath and associated wheezing. He was treated for suspected bronchitis with multiple courses of steroids and also received at least 2 courses of oral antibiotics. CT scan performed in June revealed patchy bilateral lung opacities and bronchiectasis concerning for multilobar bronchopneumonia. Lee Blevins continued to have considerable dyspnea as well as marked generalized fatigue and weakness predominantly involving his thighs. He was ultimately evaluated by Dr. 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  His prednisone which she has been taking chronically for sinusitis) was increased to 30 mg twice a day. Lee Blevins has also been started on mirtazapine and dronabinol due to poor appetite and significant weight loss. Over the last few weeks, he has noticed improvement in his breathing as well as weight gain.  As part of the patient's workup, an EKG was performed this summer and demonstrated inferior Q waves. Subsequent myocardial perfusion stress test revealed a partially reversible inferior defect and a fixed basal/mid inferoseptal defect. LVEF was mildly decreased at 50%. Findings were felt to be most consistent with diaphragmatic attenuation, though inferior infarct with peri-infarct ischemia could not be excluded. Of note, Lee Blevins has not had any chest pain, palpitations, edema, orthopnea, and PND. He has occasional orthostatic lightheadedness. Before his  respiratory illness began in the spring, he was able to walk 2 miles a day without any difficulty. The patient reports that he had EKGs and a Holter monitor while in the [employer] but otherwise had not undergone further cardiac testing until his stress test last month.   -------------------------------------------------------------------------------------------------  Cardiovascular History & Procedures: Cardiovascular Problems:  Abnormal stress test with significant coronary artery calcification by CT  Cardiomyopathy with mild reduced LVEF  Risk Factors:  Age, gender, hypertension, and remote tobacco use  Cath/PCI:  None  CV Surgery:  None  EP Procedures and Devices:  None  Non-Invasive Evaluation(s):  Myocardial perfusion stress test (12/22/15): Moderate size, moderate severity, partially reversible defect involving the inferior wall as well as a small fixed basal and mid inferoseptal defect. Felt to be most consistent with attenuation artifact but can't rule out infarct with peri-infarct ischemia. LVEF was mildly reduced (50%).  Recent CV Pertinent Labs: Lab Results  Component Value Date   CHOL 235 (H) 06/08/2015   HDL 89.50 06/08/2015   LDLCALC 131 (H) 06/08/2015   LDLDIRECT 160.0 06/13/2013   TRIG 75.0 06/08/2015   CHOLHDL 3 06/08/2015   INR 1.1 (H) 12/14/2015   K 4.6 12/30/2015   BUN 19 12/30/2015   CREATININE 0.71 12/30/2015    --------------------------------------------------------------------------------------------------  Past Medical History:  Diagnosis Date  . Asthma   . Chronic sinusitis   . History of CVA (cerebrovascular accident) 2011   hemorrhagic  . HTN (hypertension)   . Hyperlipidemia   . Nasal polyps   . Pneumonia     Past Surgical History:  Procedure Laterality Date  . NASAL POLYP EXCISION     x2  . TONSILLECTOMY      Outpatient Encounter Prescriptions as of  01/21/2016  Medication Sig  . albuterol (PROVENTIL HFA;VENTOLIN HFA) 108  (90 Base) MCG/ACT inhaler Inhale 2 puffs into the lungs every 6 (six) hours as needed for wheezing or shortness of breath.  . dronabinol (MARINOL) 2.5 MG capsule Take 1 capsule (2.5 mg total) by mouth 2 (two) times daily before lunch and supper.  . fluticasone (FLONASE) 50 MCG/ACT nasal spray USE 1 SPRAY IN EACH NOSTRIL TWICE DAILY  . Fluticasone-Salmeterol (ADVAIR) 250-50 MCG/DOSE AEPB Inhale 1 puff into the lungs 2 (two) times daily.  . mirtazapine (REMERON SOL-TAB) 15 MG disintegrating tablet Take 1 tablet (15 mg total) by mouth at bedtime.  . montelukast (SINGULAIR) 10 MG tablet Take 1 tablet by mouth at  bedtime  . omeprazole (PRILOSEC) 40 MG capsule Take 1 capsule (40 mg total) by mouth daily.  . predniSONE (DELTASONE) 20 MG tablet Take 30 mg by mouth 2 (two) times daily with a meal.   . aspirin EC 81 MG tablet Take 1 tablet (81 mg total) by mouth daily.  . [DISCONTINUED] Respiratory Therapy Supplies (FLUTTER) DEVI Blow through 4 times per set, Three sets per day (Patient not taking: Reported on 01/21/2016)  . [DISCONTINUED] Spacer/Aero-Holding Chambers (AEROCHAMBER MV) inhaler Use as instructed (Patient not taking: Reported on 01/21/2016)   No facility-administered encounter medications on file as of 01/21/2016.     Allergies: Amlodipine; Crestor [rosuvastatin]; Livalo [pitavastatin]; and Bystolic [nebivolol hcl]  Social History   Social History  . Marital status: Married    Spouse name: N/A  . Number of children: N/A  . Years of education: N/A   Occupational History  . RETIRED Retired    Public affairs consultant   Social History Main Topics  . Smoking status: Former Smoker    Packs/day: 1.00    Years: 1.00    Types: Cigarettes    Quit date: 04/17/1973  . Smokeless tobacco: Never Used     Comment: 1975  . Alcohol use 6.0 oz/week    10 Glasses of wine per week  . Drug use: No  . Sexual activity: Not Currently   Other Topics Concern  . Not on file   Social History Narrative   Regular  Exercise -  YES    Family History  Problem Relation Age of Onset  . Heart disease Father   . Valvular heart disease Father   . Neurologic Disorder Mother 58    Similar to MS  . Cancer Neg Hx   . Early death Neg Hx   . Hearing loss Neg Hx   . Hyperlipidemia Neg Hx   . Hypertension Neg Hx   . Stroke Neg Hx   . Kidney disease Neg Hx     Review of Systems: Review of Systems  Constitutional: Positive for malaise/fatigue and weight loss. Negative for chills and fever.  HENT: Positive for congestion (Chronic problem, improved with steroids).   Eyes: Negative.   Respiratory: Positive for shortness of breath.   Cardiovascular: Negative for chest pain, palpitations, orthopnea, leg swelling and PND.  Gastrointestinal: Negative.   Genitourinary: Negative.   Musculoskeletal: Positive for myalgias.  Skin: Negative.   Neurological: Positive for dizziness (balance problems and occasional orthostatic lightheadedness) and weakness. Negative for headaches.  Endo/Heme/Allergies: Negative.   Psychiatric/Behavioral: The patient is nervous/anxious.    --------------------------------------------------------------------------------------------------  Physical Exam: BP 124/64   Pulse 77   Ht 6\' 1"  (1.854 m)   Wt 158 lb 12.8 oz (72 kg)   SpO2 99%   BMI 20.95 kg/m  General:  Slender man, seated comfortably in the exam room. He is accompanied by his wife. HEENT: No conjunctival pallor or scleral icterus.  Moist mucous membranes.  OP clear. Neck: Supple without lymphadenopathy, thyromegaly, JVD, or HJR.  No carotid bruit. Lungs: Normal work of breathing.  Clear to auscultation bilaterally without wheezes or crackles. CV: Regular rate and rhythm without murmurs, rubs, or gallops.  Non-displaced PMI. Abd: Bowel sounds present.  Soft, NT/ND without hepatosplenomegaly Ext: No lower extremity edema.  Radial, PT, and DP pulses are 2+ bilaterally Skin: warm and dry without rash Neuro: CNIII-XII  intact.  Strength and fine-touch sensation intact in upper and lower extremities bilaterally. Psych: Normal mood and affect.  EKG:  Normal sinus rhythm without significant abnormalities. Inferior Q waves noted on prior tracing from 12/14/15 are no longer evident.  Regadenoson myocardial perfusion stress test (12/22/15): I have personally reviewed the images. Moderate size, moderate severity, partially reversible defect involving the inferior wall as well as a small fixed basal and mid inferoseptal defect. This may reflect attenuation artifact but prior inferior infarct with peri-infarct ischemia cannot be excluded. LVEF was mildly reduced (50%).  CTA chest (09/23/15): I have personally reviewed the images. Extensive coronary artery calcification is evident. There are patchy, nodular, and groundglass opacities in both lungs with accompanying bronchiectasis.  Lab Results  Component Value Date   WBC 13.8 (H) 12/14/2015   HGB 13.4 12/14/2015   HCT 39.4 12/14/2015   MCV 92.0 12/14/2015   PLT 414.0 (H) 12/14/2015    Lab Results  Component Value Date   NA 136 12/30/2015   K 4.6 12/30/2015   CL 98 12/30/2015   CO2 32 12/30/2015   BUN 19 12/30/2015   CREATININE 0.71 12/30/2015   GLUCOSE 88 12/30/2015   ALT 11 12/30/2015    Lab Results  Component Value Date   CHOL 235 (H) 06/08/2015   HDL 89.50 06/08/2015   LDLCALC 131 (H) 06/08/2015   LDLDIRECT 160.0 06/13/2013   TRIG 75.0 06/08/2015   CHOLHDL 3 06/08/2015    --------------------------------------------------------------------------------------------------  ASSESSMENT AND PLAN: 1. Cardiomyopathy, unspecified type ([place]) Patient was noted to have mildly reduced LV systolic function on recent myocardial perfusion stress test. He denies a history of prior cardiac disease. He appears euvolemic and well compensated on exam today. His recent shortness of breath is most likely due to a primary lung process. However, we have agreed to obtain a  transthoracic echocardiogram to further assess his LVEF and evaluate for other structural heart disease.  2. Shortness of breath I suspect this is likely multifactorial but predominantly driven by patient's underlying pulmonary disease. He has completed several courses of steroids and remains on prednisone 30 mg twice a day. He was also treated with courses of antibiotics. 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 Given his clinical picture, heart failure and symptomatic coronary artery disease with isolated dyspnea and less likely. However, as above, we will proceed with a transthoracic echocardiogram for further evaluation.  3. Coronary artery disease involving native coronary artery of native heart without angina pectoris and abnormal stress test The patient denies chest pain or prior cardiac disease. He was noted to have an abnormal EKG in late August demonstrating inferior Q waves. However, today's tracing is unremarkable without evidence of prior MI. Myocardial perfusion stress test last month revealed a partially reversible inferior defect as well as a fixed basal/mid inferior defect that may reflect diaphragmatic attenuation, though prior MI with peri-infarct ischemia  could also have this appearance. We have discussed further treatment options including medical therapy and cardiac catheterization. Given the patient's lack of chest pain and normal EKG today, we have agreed to follow a conservative approach. We will perform a transthoracic echocardiogram to further evaluate for diminished LV systolic function and possible inferior wall motion abnormality. If a significant abnormality is identified, we will need to proceed with coronary angiography. However, given his spontaneous hemorrhagic stroke in 2011, further discussion with neurology may be necessary to ensure that therapeutic anticoagulation and dual antiplatelet therapy are appropriate if  obstructive CAD is identified. In the meantime, we have agreed to start aspirin 81 mg daily. As the patient has been intolerant of several statins in the past and is currently experiencing muscle weakness and myalgias, we will defer another trial of statin therapy. However, he would benefit from repeat attempt at statin treatment given his direct LDL of 160 and significant coronary artery calcification by CT.  Follow-up: Return to clinic in 3 months.  Greater than 60 minutes were spent interviewing and examining the patient, counseling the patient and his wife, and reviewing prior records and imaging studies. More than 50% of this time was spent face-to-face with the patient and his wife.  Nelva Bush, MD 01/21/2016 2:26 PM

## 2016-01-21 NOTE — Patient Instructions (Signed)
Medication Instructions:  Start aspirin 81mg  daily.  Labwork: none  Testing/Procedures: Your physician has requested that you have an echocardiogram. Echocardiography is a painless test that uses sound waves to create images of your heart. It provides your doctor with information about the size and shape of your heart and how well your heart's chambers and valves are working. This procedure takes approximately one hour. There are no restrictions for this procedure.    Follow-Up: Your physician recommends that you schedule a follow-up appointment in: 3 months with Dr End.   .     If you need a refill on your cardiac medications before your next appointment, please call your pharmacy.

## 2016-01-22 DIAGNOSIS — I429 Cardiomyopathy, unspecified: Secondary | ICD-10-CM | POA: Insufficient documentation

## 2016-01-24 DIAGNOSIS — M2550 Pain in unspecified joint: Secondary | ICD-10-CM | POA: Diagnosis not present

## 2016-01-24 DIAGNOSIS — Z79899 Other long term (current) drug therapy: Secondary | ICD-10-CM | POA: Diagnosis not present

## 2016-01-24 DIAGNOSIS — R700 Elevated erythrocyte sedimentation rate: Secondary | ICD-10-CM | POA: Diagnosis not present

## 2016-01-26 DIAGNOSIS — R918 Other nonspecific abnormal finding of lung field: Secondary | ICD-10-CM | POA: Diagnosis not present

## 2016-01-26 DIAGNOSIS — M301 Polyarteritis with lung involvement [Churg-Strauss]: Secondary | ICD-10-CM | POA: Diagnosis not present

## 2016-01-26 DIAGNOSIS — Z79899 Other long term (current) drug therapy: Secondary | ICD-10-CM | POA: Diagnosis not present

## 2016-01-26 DIAGNOSIS — D721 Eosinophilia: Secondary | ICD-10-CM | POA: Diagnosis not present

## 2016-01-28 ENCOUNTER — Encounter: Payer: Self-pay | Admitting: Internal Medicine

## 2016-02-02 DIAGNOSIS — Z79899 Other long term (current) drug therapy: Secondary | ICD-10-CM | POA: Diagnosis not present

## 2016-02-02 DIAGNOSIS — D721 Eosinophilia: Secondary | ICD-10-CM | POA: Diagnosis not present

## 2016-02-02 DIAGNOSIS — M301 Polyarteritis with lung involvement [Churg-Strauss]: Secondary | ICD-10-CM | POA: Diagnosis not present

## 2016-02-02 DIAGNOSIS — R634 Abnormal weight loss: Secondary | ICD-10-CM | POA: Diagnosis not present

## 2016-02-02 DIAGNOSIS — M8589 Other specified disorders of bone density and structure, multiple sites: Secondary | ICD-10-CM | POA: Diagnosis not present

## 2016-02-03 ENCOUNTER — Ambulatory Visit (HOSPITAL_COMMUNITY): Payer: Medicare Other | Attending: Cardiovascular Disease

## 2016-02-03 ENCOUNTER — Other Ambulatory Visit: Payer: Self-pay

## 2016-02-03 DIAGNOSIS — I429 Cardiomyopathy, unspecified: Secondary | ICD-10-CM | POA: Diagnosis not present

## 2016-02-03 DIAGNOSIS — Z8673 Personal history of transient ischemic attack (TIA), and cerebral infarction without residual deficits: Secondary | ICD-10-CM | POA: Diagnosis not present

## 2016-02-03 DIAGNOSIS — J45909 Unspecified asthma, uncomplicated: Secondary | ICD-10-CM | POA: Insufficient documentation

## 2016-02-03 DIAGNOSIS — I059 Rheumatic mitral valve disease, unspecified: Secondary | ICD-10-CM | POA: Diagnosis not present

## 2016-02-03 DIAGNOSIS — E785 Hyperlipidemia, unspecified: Secondary | ICD-10-CM | POA: Insufficient documentation

## 2016-02-03 DIAGNOSIS — I10 Essential (primary) hypertension: Secondary | ICD-10-CM | POA: Diagnosis not present

## 2016-02-03 DIAGNOSIS — Z8249 Family history of ischemic heart disease and other diseases of the circulatory system: Secondary | ICD-10-CM | POA: Insufficient documentation

## 2016-02-03 DIAGNOSIS — Z87891 Personal history of nicotine dependence: Secondary | ICD-10-CM | POA: Diagnosis not present

## 2016-02-08 ENCOUNTER — Encounter: Payer: Self-pay | Admitting: Internal Medicine

## 2016-02-08 DIAGNOSIS — M301 Polyarteritis with lung involvement [Churg-Strauss]: Secondary | ICD-10-CM

## 2016-02-08 DIAGNOSIS — D7218 Eosinophilia in diseases classified elsewhere: Secondary | ICD-10-CM | POA: Insufficient documentation

## 2016-02-10 ENCOUNTER — Telehealth: Payer: Self-pay | Admitting: *Deleted

## 2016-02-10 NOTE — Telephone Encounter (Signed)
Spoke with patient about echo results 

## 2016-02-10 NOTE — Telephone Encounter (Signed)
02/10/16 LMTCB

## 2016-02-10 NOTE — Telephone Encounter (Signed)
Result Notes   Notes Recorded by Nelva Bush, MD on 02/04/2016 at 7:41 AM EDT Please let Lee Blevins know that his echo is normal. He should let us know if he develops any new symptoms like chest pain or worsening shortness of breath. Otherwise, we will follow-up in 3 months as previously discussed. Thanks.

## 2016-02-11 ENCOUNTER — Encounter: Payer: Self-pay | Admitting: Internal Medicine

## 2016-02-11 ENCOUNTER — Ambulatory Visit (INDEPENDENT_AMBULATORY_CARE_PROVIDER_SITE_OTHER): Payer: Medicare Other | Admitting: Internal Medicine

## 2016-02-11 DIAGNOSIS — I428 Other cardiomyopathies: Secondary | ICD-10-CM

## 2016-02-11 DIAGNOSIS — M301 Polyarteritis with lung involvement [Churg-Strauss]: Secondary | ICD-10-CM

## 2016-02-11 DIAGNOSIS — J4540 Moderate persistent asthma, uncomplicated: Secondary | ICD-10-CM | POA: Insufficient documentation

## 2016-02-11 DIAGNOSIS — J9621 Acute and chronic respiratory failure with hypoxia: Secondary | ICD-10-CM | POA: Diagnosis not present

## 2016-02-11 DIAGNOSIS — I2510 Atherosclerotic heart disease of native coronary artery without angina pectoris: Secondary | ICD-10-CM

## 2016-02-11 DIAGNOSIS — J328 Other chronic sinusitis: Secondary | ICD-10-CM

## 2016-02-11 NOTE — Assessment & Plan Note (Signed)
We discussed Singulair and I gave him permission to try off of it. Its clinical benefit right now is unclear. Otherwise control currently is good on prednisone.

## 2016-02-11 NOTE — Assessment & Plan Note (Signed)
Currently improved on prednisone. Watch for return of polyps.

## 2016-02-11 NOTE — Progress Notes (Signed)
Subjective:    Patient ID: Lee Blevins, male    DOB: Dec 31, 1945, 70 y.o.   MRN: [id number]  HPI 11/03/10- followed for asthma, chronic sinusitis, nasal polyps, eosinophilia/ Churg-Strauss, complicated by hx CVA, HBP Barium swallow negative for reflux, incidental small diverticulum Sputum culture from 09/06/2015 had grown Pseudomonas sensitive to Cipro, QuantiFERON gold    11/10/2015-70 year old male former smoker followed for asthma/bronchitis, eosinophilia, lung nodules, chronic sinusitis, nasal polyps, chronic hypoxic respiratory failure complicated by history CVA, HBP, CAD FOLLOWS FOR: Last seen on 10-26-15 by SG; CXR today-attached. Pt states he is feeling better since being seen last and denies any cough, congestion(excessive), fever, or chills. LOV 7/10- NP-  F/u RLL pneumonia in June, cultured Pseudomonas Rx'd cipro. Treated with prednisone taper, Prilosec, GERD diet, spacer for rescue inhaler Wife here Clearly feeling better, less anxious and looks stronger. Breathing is unlabored. Question how much of his improvement is due to Cipro and how much to the prednisone taper. CXR today- IMPRESSION: Reactive airway disease. Ongoing improvement in patchy right lower lobe pneumonia. Slight interval increase conspicuity of right perihilar linear density in the mid lung likely reflects atelectasis. An additional follow-up chest x-ray in 2-3 weeks is recommended assuming the patient continues to improve clinically. Aortic atherosclerosis. Electronically Signed   By: Francisco  Martinique M.D.   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 Started cytoxan 02/02/16 FOLLOWS FOR: Denies any breathing issues at this point. Pt has questions regarding medications and possible interfernce with RA meds.  Now on Cytoxan 75 mg daily, prednisone 30 mg daily now-tapering per Dr.Truslow He began feeling better before Cytoxan was begun and is regaining some weight. No longer feels as tired in his thighs. No arthritis pain or rash, fever or swollen glands. Scant clear mucus with cough. Denies nasal congestion or sinus pain. Continues daily Neti pot Wants to turn oxygen back in. Has not been using it at all and doesn't want to pay for it. CXR 12/14/2015- interim clearing of right base infiltrate  ROS-see HPI Constitutional:   No-   weight loss, night sweats, fevers, chills, fatigue, lassitude. HEENT:   No-  headaches, difficulty swallowing, tooth/dental problems, sore throat,       No-  sneezing, itching, ear ache, +nasal congestion, post nasal drip,  CV:  No-   chest pain, orthopnea, PND, swelling in lower extremities, anasarca, dizziness, palpitations Resp: + shortness of breath with exertion or at rest.             + productive cough,  No non-productive cough,  No- coughing up of blood.              No-   change in color of mucus.  No- wheezing.   Skin: No-   rash or lesions. GI:  No-   heartburn, indigestion, abdominal pain, nausea, vomiting,  GU:  MS:  No-   joint pain or swelling.   Neuro-     nothing unusual Psych:  No- change in mood or affect. No depression or anxiety.  No memory loss.  Objective:   Physical Exam General- Alert, Oriented, Affect-appropriate, Distress- none acute, trim, Looks comfortable  today Skin- rash-none, lesions- none, excoriation- none Lymphadenopathy- none Head- atraumatic            Eyes- Gross vision intact, PERRLA, conjunctivae clear secretions            Ears- Hearing, canals normal             Nose- Clear, No- Septal dev, mucus +, polyp-none seen, no-erosion, perforation             Throat- Mallampati II-III , mucosa clear , drainage- none, tonsils- atrophic Neck- flexible , trachea midline, no stridor , thyroid nl, carotid no bruit Chest - symmetrical excursion , unlabored           Heart/CV- RRR , no murmur , no gallop  , no rub, nl s1 s2                           - JVD- none , edema- none, stasis changes- none, varices- none           Lung-  wheeze-none l, cough- none , dullness-none, rub- none, unlabored           Chest wall-  Abd-  Br/ Gen/ Rectal- Not done, not indicated Extrem- cyanosis- none, clubbing, none, atrophy- none, strength- nl Neuro- +Occasional twitch or jerk  Assessment & Plan:

## 2016-02-11 NOTE — Assessment & Plan Note (Signed)
Followed by cardiology 

## 2016-02-11 NOTE — Assessment & Plan Note (Addendum)
Began Cytoxan and tapering prednisone from 60 mg daily under direction of his rheumatologist/Dr. Charlestine Night. 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 Singulair did not cause this condition. Echocardiogram reported normal 02/03/2016. Cardiomyopathy can occur with this syndrome. Plan-we'll watch clinical course. Discussed potential infections related to broaden immunosuppression so that he will report problems early.

## 2016-02-11 NOTE — Assessment & Plan Note (Signed)
Oxygenation improved. He is not using oxygen now and wants it stopped because of cost. Plan- dc O2

## 2016-02-11 NOTE — Patient Instructions (Signed)
Order- DME [facility]- d/c home O2  Ok to try off Singulair if you want- increased wheezing would be a reason to go back on it.  Please call as needed

## 2016-02-14 ENCOUNTER — Telehealth: Payer: Self-pay | Admitting: Internal Medicine

## 2016-02-14 DIAGNOSIS — J9621 Acute and chronic respiratory failure with hypoxia: Secondary | ICD-10-CM

## 2016-02-14 NOTE — Telephone Encounter (Signed)
I have placed the order in the chart to be sent to [facility] to have the oxygen picked up.  I called and lmom to make the pt and his wife aware.

## 2016-02-28 DIAGNOSIS — R918 Other nonspecific abnormal finding of lung field: Secondary | ICD-10-CM | POA: Diagnosis not present

## 2016-02-28 DIAGNOSIS — D721 Eosinophilia: Secondary | ICD-10-CM | POA: Diagnosis not present

## 2016-02-28 DIAGNOSIS — Z79899 Other long term (current) drug therapy: Secondary | ICD-10-CM | POA: Diagnosis not present

## 2016-03-01 DIAGNOSIS — Z79899 Other long term (current) drug therapy: Secondary | ICD-10-CM | POA: Diagnosis not present

## 2016-03-01 DIAGNOSIS — R634 Abnormal weight loss: Secondary | ICD-10-CM | POA: Diagnosis not present

## 2016-03-01 DIAGNOSIS — D721 Eosinophilia: Secondary | ICD-10-CM | POA: Diagnosis not present

## 2016-03-01 DIAGNOSIS — M301 Polyarteritis with lung involvement [Churg-Strauss]: Secondary | ICD-10-CM | POA: Diagnosis not present

## 2016-03-03 ENCOUNTER — Other Ambulatory Visit: Payer: Self-pay | Admitting: Internal Medicine

## 2016-03-03 NOTE — Telephone Encounter (Signed)
CY Please advise on refill.  

## 2016-03-08 ENCOUNTER — Other Ambulatory Visit: Payer: Self-pay

## 2016-03-08 MED ORDER — MONTELUKAST SODIUM 10 MG PO TABS: 10.0000 mg | ORAL_TABLET | Freq: Every day | ORAL | 11 refills | Status: DC

## 2016-03-15 ENCOUNTER — Ambulatory Visit (INDEPENDENT_AMBULATORY_CARE_PROVIDER_SITE_OTHER): Payer: Medicare Other | Admitting: Internal Medicine

## 2016-03-15 ENCOUNTER — Encounter: Payer: Self-pay | Admitting: Internal Medicine

## 2016-03-15 VITALS — BP 120/72 | HR 66 | Temp 97.9°F | Resp 16 | Ht 73.0 in | Wt 178.0 lb

## 2016-03-15 DIAGNOSIS — I2510 Atherosclerotic heart disease of native coronary artery without angina pectoris: Secondary | ICD-10-CM | POA: Diagnosis not present

## 2016-03-15 DIAGNOSIS — I10 Essential (primary) hypertension: Secondary | ICD-10-CM | POA: Diagnosis not present

## 2016-03-15 DIAGNOSIS — M301 Polyarteritis with lung involvement [Churg-Strauss]: Secondary | ICD-10-CM | POA: Diagnosis not present

## 2016-03-15 DIAGNOSIS — T380X5A Adverse effect of glucocorticoids and synthetic analogues, initial encounter: Secondary | ICD-10-CM | POA: Diagnosis not present

## 2016-03-15 DIAGNOSIS — M818 Other osteoporosis without current pathological fracture: Secondary | ICD-10-CM

## 2016-03-15 NOTE — Progress Notes (Signed)
Pre visit review using our clinic review tool, if applicable. No additional management support is needed unless otherwise documented below in the visit note. 

## 2016-03-15 NOTE — Patient Instructions (Signed)

## 2016-03-15 NOTE — Progress Notes (Signed)
Subjective:  Patient ID: Lee Blevins, male    DOB: Aug 02, 1945  Age: 69 y.o. MRN: [id number]  CC: Asthma   HPI Lee Blevins presents for follow-up on his recent diagnosis of Churg-Strauss syndrome. He is feeling much better. He has stopped using oxygen and said his breathing has been normal over the last few weeks. His appetite has increased and he is gaining weight. He tells me his strength and endurance are coming back as well. He has taken an extended course of prednisone lately and is worried about osteoporosis. He is currently on alendronate at the direction of his rheumatologist and is tolerating it well.  Outpatient Medications Prior to Visit  Medication Sig Dispense Refill  . ADVAIR DISKUS 250-50 MCG/DOSE AEPB INHALE 1 PUFF THEN RINSE MOUTH, TWICE DAILY 60 each 11  . albuterol (PROVENTIL HFA;VENTOLIN HFA) 108 (90 Base) MCG/ACT inhaler Inhale 2 puffs into the lungs every 6 (six) hours as needed for wheezing or shortness of breath. 1 Inhaler 11  . Alendronate Sodium (FOSAMAX PO) Take by mouth once a week.    Marland Kitchen aspirin EC 81 MG tablet Take 1 tablet (81 mg total) by mouth daily.    . cyclophosphamide (CYTOXAN) 25 MG tablet Take 75 mg by mouth daily. Give on an empty stomach 1 hour before or 2 hours after meals.      . fluticasone (FLONASE) 50 MCG/ACT nasal spray USE 1 SPRAY IN EACH NOSTRIL TWICE DAILY 48 g 3  . Fluticasone-Salmeterol (ADVAIR) 250-50 MCG/DOSE AEPB Inhale 1 puff into the lungs 2 (two) times daily.    . montelukast (SINGULAIR) 10 MG tablet Take 1 tablet (10 mg total) by mouth at bedtime. 30 tablet 11  . predniSONE (DELTASONE) 20 MG tablet Take 20 mg by mouth daily.   0  . dronabinol (MARINOL) 2.5 MG capsule Take 1 capsule (2.5 mg total) by mouth 2 (two) times daily before lunch and supper. 60 capsule 1  . mirtazapine (REMERON SOL-TAB) 15 MG disintegrating tablet Take 1 tablet (15 mg total) by mouth at bedtime. 90 tablet 1  . omeprazole (PRILOSEC) 40 MG capsule Take 1  capsule (40 mg total) by mouth daily. (Patient not taking: Reported on 02/11/2016) 30 capsule 6   No facility-administered medications prior to visit.     ROS Review of Systems  Constitutional: Negative for appetite change, chills, diaphoresis, fatigue, fever and unexpected weight change.  HENT: Negative.  Negative for trouble swallowing.   Eyes: Negative for visual disturbance.  Respiratory: Negative for cough, chest tightness, shortness of breath, wheezing and stridor.   Cardiovascular: Negative for chest pain, palpitations and leg swelling.  Gastrointestinal: Negative for abdominal pain, constipation, diarrhea, nausea and vomiting.  Endocrine: Negative.   Genitourinary: Negative.  Negative for difficulty urinating.  Musculoskeletal: Negative.  Negative for arthralgias and back pain.  Skin: Negative.   Allergic/Immunologic: Negative.   Neurological: Negative.  Negative for dizziness, syncope, weakness and headaches.  Hematological: Negative for adenopathy. Does not bruise/bleed easily.  Psychiatric/Behavioral: Negative.  Negative for behavioral problems, confusion and sleep disturbance. The patient is not nervous/anxious.     Objective:  BP 120/72 (BP Location: Left Arm, Patient Position: Sitting, Cuff Size: Normal)   Pulse 66   Temp 97.9 F (36.6 C) (Oral)   Resp 16   Ht 6\' 1"  (1.854 m)   Wt 178 lb (80.7 kg)   SpO2 98%   BMI 23.48 kg/m   BP Readings from Last 3 Encounters:  03/15/16 120/72  02/11/16 126/62  01/21/16 124/64    Wt Readings from Last 3 Encounters:  03/15/16 178 lb (80.7 kg)  02/11/16 163 lb 12.8 oz (74.3 kg)  01/21/16 158 lb 12.8 oz (72 kg)    Physical Exam  Constitutional: He is oriented to person, place, and time. No distress.  HENT:  Mouth/Throat: Oropharynx is clear and moist. No oropharyngeal exudate.  Eyes: Conjunctivae are normal. Right eye exhibits no discharge. Left eye exhibits no discharge. No scleral icterus.  Neck: Normal range of  motion. Neck supple. No JVD present. No tracheal deviation present. No thyromegaly present.  Cardiovascular: Normal rate, regular rhythm, normal heart sounds and intact distal pulses.  Exam reveals no gallop and no friction rub.   No murmur heard. Pulmonary/Chest: Effort normal and breath sounds normal. No stridor. No respiratory distress. He has no wheezes. He has no rales. He exhibits no tenderness.  Abdominal: Soft. Bowel sounds are normal. He exhibits no distension and no mass. There is no tenderness. There is no rebound and no guarding.  Musculoskeletal: Normal range of motion. He exhibits no edema, tenderness or deformity.  Lymphadenopathy:    He has no cervical adenopathy.  Neurological: He is oriented to person, place, and time.  Skin: Skin is warm and dry. No rash noted. He is not diaphoretic. No erythema. No pallor.  Psychiatric: He has a normal mood and affect. His behavior is normal. Judgment and thought content normal.  Vitals reviewed.   Lab Results  Component Value Date   WBC 13.8 (H) 12/14/2015   HGB 13.4 12/14/2015   HCT 39.4 12/14/2015   PLT 414.0 (H) 12/14/2015   GLUCOSE 88 12/30/2015   CHOL 235 (H) 06/08/2015   TRIG 75.0 06/08/2015   HDL 89.50 06/08/2015   LDLDIRECT 160.0 06/13/2013   LDLCALC 131 (H) 06/08/2015   ALT 11 12/30/2015   AST 20 12/30/2015   NA 136 12/30/2015   K 4.6 12/30/2015   CL 98 12/30/2015   CREATININE 0.71 12/30/2015   BUN 19 12/30/2015   CO2 32 12/30/2015   TSH 3.29 12/14/2015   PSA 0.66 06/08/2015   INR 1.1 (H) 12/14/2015   HGBA1C 5.5 06/08/2015    Ct Abdomen Pelvis W Contrast  Result Date: 12/31/2015 CLINICAL DATA:  20 pound weight loss.  No appetite.  Fatigue. EXAM: CT ABDOMEN AND PELVIS WITH CONTRAST TECHNIQUE: Multidetector CT imaging of the abdomen and pelvis was performed using the standard protocol following bolus administration of intravenous contrast. CONTRAST:  164mL ISOVUE-300 IOPAMIDOL (ISOVUE-300) INJECTION 61%  COMPARISON:  None. FINDINGS: Lower chest: Lung bases are clear.  Coronary artery atherosclerosis. Hepatobiliary: No focal liver abnormality is seen. No gallstones, gallbladder wall thickening, or biliary dilatation. Pancreas: Atrophic pancreas. No peripancreatic inflammatory changes. No pancreatic mass. Spleen: Normal in size without focal abnormality. Adrenals/Urinary Tract: Normal adrenal glands. 2.1 cm hypodense, fluid attenuating right renal mass most consistent with a cyst. Kidneys are otherwise normal. No urolithiasis or obstructive uropathy. Normal bladder. Stomach/Bowel: Stomach is within normal limits. No evidence of bowel wall thickening, distention, or inflammatory changes. Appendix is not visualized. Vascular/Lymphatic: Aortic atherosclerosis. No enlarged abdominal or pelvic lymph nodes. Reproductive: Prostate is unremarkable. Other: No abdominal wall hernia or abnormality. No abdominopelvic ascites. Musculoskeletal: No lytic or sclerotic osseous lesion. 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 Mild osteoarthritis of the left hip. 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 Bilateral facet arthropathy at L4-5 resulting in minimal grade 1  anterolisthesis of L4 on L5. IMPRESSION: 1. No acute abdominal or pelvic pathology. 2.  Aortic Atherosclerosis (ICD10-170.0) Electronically Signed   By: Kathreen Devoid   On: 12/31/2015 09:14    Assessment & Plan:   Deran was seen today for asthma.  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 Stidd's omeprazole, dronabinol, and mirtazapine. I am also having him maintain his fluticasone, albuterol,  Fluticasone-Salmeterol, predniSONE, aspirin EC, cyclophosphamide, Alendronate Sodium (FOSAMAX PO), ADVAIR DISKUS, and montelukast.  No orders of the defined types were placed in this encounter.    Follow-up: Return in about 6 months (around 09/12/2016).  Scarlette Calico, MD

## 2016-03-24 ENCOUNTER — Telehealth: Payer: Self-pay

## 2016-03-24 NOTE — Telephone Encounter (Signed)
Order [id number]

## 2016-04-14 DIAGNOSIS — Z79899 Other long term (current) drug therapy: Secondary | ICD-10-CM | POA: Diagnosis not present

## 2016-04-14 DIAGNOSIS — D721 Eosinophilia: Secondary | ICD-10-CM | POA: Diagnosis not present

## 2016-04-14 DIAGNOSIS — M301 Polyarteritis with lung involvement [Churg-Strauss]: Secondary | ICD-10-CM | POA: Diagnosis not present

## 2016-04-14 DIAGNOSIS — R634 Abnormal weight loss: Secondary | ICD-10-CM | POA: Diagnosis not present

## 2016-04-18 ENCOUNTER — Ambulatory Visit
Admission: RE | Admit: 2016-04-18 | Discharge: 2016-04-18 | Disposition: A | Discharge status: Home / Self Care | Payer: Medicare Other | Source: Ambulatory Visit | Attending: Rheumatology | Admitting: Rheumatology

## 2016-04-18 ENCOUNTER — Other Ambulatory Visit: Payer: Self-pay | Admitting: Rheumatology

## 2016-04-18 DIAGNOSIS — Z79899 Other long term (current) drug therapy: Secondary | ICD-10-CM | POA: Diagnosis not present

## 2016-04-18 DIAGNOSIS — D721 Eosinophilia: Secondary | ICD-10-CM | POA: Diagnosis not present

## 2016-04-18 DIAGNOSIS — R911 Solitary pulmonary nodule: Secondary | ICD-10-CM

## 2016-04-18 DIAGNOSIS — R918 Other nonspecific abnormal finding of lung field: Secondary | ICD-10-CM | POA: Diagnosis not present

## 2016-04-18 DIAGNOSIS — M301 Polyarteritis with lung involvement [Churg-Strauss]: Secondary | ICD-10-CM | POA: Diagnosis not present

## 2016-04-25 ENCOUNTER — Ambulatory Visit (INDEPENDENT_AMBULATORY_CARE_PROVIDER_SITE_OTHER)
Admission: RE | Admit: 2016-04-25 | Discharge: 2016-04-25 | Disposition: A | Discharge status: Home / Self Care | Payer: Medicare Other | Source: Ambulatory Visit | Attending: Internal Medicine | Admitting: Internal Medicine

## 2016-04-25 DIAGNOSIS — T380X5A Adverse effect of glucocorticoids and synthetic analogues, initial encounter: Secondary | ICD-10-CM | POA: Diagnosis not present

## 2016-04-25 DIAGNOSIS — M818 Other osteoporosis without current pathological fracture: Secondary | ICD-10-CM | POA: Diagnosis not present

## 2016-05-01 ENCOUNTER — Other Ambulatory Visit: Payer: Self-pay | Admitting: Internal Medicine

## 2016-05-01 ENCOUNTER — Encounter: Payer: Self-pay | Admitting: Internal Medicine

## 2016-05-01 LAB — HM DEXA SCAN

## 2016-05-16 ENCOUNTER — Encounter: Payer: Self-pay | Admitting: Internal Medicine

## 2016-05-16 ENCOUNTER — Ambulatory Visit (INDEPENDENT_AMBULATORY_CARE_PROVIDER_SITE_OTHER): Payer: Medicare Other | Admitting: Internal Medicine

## 2016-05-16 DIAGNOSIS — J328 Other chronic sinusitis: Secondary | ICD-10-CM

## 2016-05-16 DIAGNOSIS — J4540 Moderate persistent asthma, uncomplicated: Secondary | ICD-10-CM

## 2016-05-16 DIAGNOSIS — M301 Polyarteritis with lung involvement [Churg-Strauss]: Secondary | ICD-10-CM | POA: Diagnosis not present

## 2016-05-16 NOTE — Patient Instructions (Signed)
Ok to reduce Advair to once daily for a week or two. If stable, then ok to see how you feel off it.  Please call as needed

## 2016-05-16 NOTE — Assessment & Plan Note (Signed)
Only minimal morning cough but no routine wheeze or need for rescue inhaler. We are going to explore whether he even needs Advair, rather than assuming it. Plan-drop to one puff once daily for a few weeks, then stop Advair if stable. Resume if needed.

## 2016-05-16 NOTE — Progress Notes (Signed)
Subjective:    Patient ID: Lee Blevins, male    DOB: 12-23-1945, 71 y.o.   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 Started cytoxan 02/02/16  ------------------------------------------------------------  02/11/2016-71 year old male former smoker followed for asthma/bronchitis, eosinophilia, lung nodules, chronic sinusitis, nasal polyps/ Churg-Strauss/ cytoxan, chronic hypoxic respiratory failure, complicated by history CVA, HBP, CAD Churg-Strauss- Dx by Dr Truslow/ Rheumatology based on eosinophilia, chronic asthma, chronic sinus disease, no path biospy. Started cytoxan 02/02/16 FOLLOWS FOR: Denies any breathing issues at this point. Pt has questions regarding medications and possible interfernce with RA meds.  Now on Cytoxan 75 mg daily, prednisone 30 mg daily now-tapering per Dr.Truslow He began feeling better before Cytoxan was begun and is regaining some weight. No longer feels as tired in his thighs. No arthritis pain or rash, fever or swollen glands. Scant clear mucus with cough. Denies nasal congestion or sinus pain. Continues daily Neti pot Wants to turn oxygen back in. Has not been using it at all and doesn't want to pay for it. CXR 12/14/2015- interim clearing of right base infiltrate  05/16/2016-71 year old male former smoker followed for asthma/bronchitis, eosinophilia, lung  nodules, chronic sinusitis, nasal polyps/Churg-Strauss/Cytoxan, chronic hypoxic respiratory failure, complicated by history CVA, HBP, CAD FOLLOWS FOR: pt c/o stable sob with exertion, prod cough with clear mucus worse qam.   Slowly tapering prednisone/Cytoxan guided by Dr. Charlestine Night. Denies wheeze and has only minimal morning cough with scant clear sputum. Has continued Advair twice daily without using rescue inhaler in a long time. Body weight back to normal. Denies rash, significant joint pain, adenopathy, fever CXR- 04/18/16 IMPRESSION: Improvement in right lower lobe airspace disease. Coronary artery disease.  ROS-see HPI Constitutional:   No-   weight loss, night sweats, fevers, chills, fatigue, lassitude. HEENT:   No-  headaches, difficulty swallowing, tooth/dental problems, sore throat,       No-  sneezing, itching, ear ache, +nasal congestion, post nasal drip,  CV:  No-   chest pain, orthopnea, PND, swelling in lower extremities, anasarca, dizziness, palpitations Resp: + shortness of breath with exertion or at rest.             + productive cough,  No non-productive cough,  No- coughing up of blood.              No-   change in color of mucus.  No- wheezing.   Skin: No-   rash or lesions. GI:  No-   heartburn, indigestion, abdominal pain, nausea, vomiting,  GU:  MS:  No-   joint pain or swelling.   Neuro-     nothing unusual Psych:  No- change in mood or affect. No depression or anxiety.  No memory loss.  Objective:   Physical Exam General- Alert, Oriented, Affect-appropriate, Distress- none acute, trim, Looks Well Skin- rash-none, lesions- none, excoriation- none Lymphadenopathy- none Head- atraumatic            Eyes- Gross vision intact, PERRLA, conjunctivae clear secretions            Ears-  Hearing, canals normal            Nose- Clear, No- Septal dev, mucus -none, polyp-none seen, no-erosion, perforation             Throat- Mallampati II-III , mucosa clear , drainage- none,  tonsils- atrophic Neck- flexible , trachea midline, no stridor , thyroid nl, carotid no bruit Chest - symmetrical excursion , unlabored           Heart/CV- RRR , no murmur , no gallop  , no rub, nl s1 s2                           - JVD- none , edema- none, stasis changes- none, varices- none           Lung-  wheeze-none , cough- none , dullness-none, rub- none, unlabored           Chest wall-  Abd-  Br/ Gen/ Rectal- Not done, not indicated Extrem- cyanosis- none, clubbing, none, atrophy- none, strength- nl Neuro- +Occasional twitch or jerk  Assessment & Plan:

## 2016-05-16 NOTE — Assessment & Plan Note (Signed)
Slowly tapering Cytoxan/prednisone. Managed by Dr. Truslow/ rheumatology

## 2016-05-16 NOTE — Assessment & Plan Note (Signed)
Nasal symptoms have been well controlled on present regimen and presumably are part of his inflammatory syndrome, responsive to Cytoxan plus prednisone

## 2016-06-19 DIAGNOSIS — D721 Eosinophilia: Secondary | ICD-10-CM | POA: Diagnosis not present

## 2016-06-19 DIAGNOSIS — Z79899 Other long term (current) drug therapy: Secondary | ICD-10-CM | POA: Diagnosis not present

## 2016-06-19 DIAGNOSIS — M301 Polyarteritis with lung involvement [Churg-Strauss]: Secondary | ICD-10-CM | POA: Diagnosis not present

## 2016-06-21 DIAGNOSIS — M301 Polyarteritis with lung involvement [Churg-Strauss]: Secondary | ICD-10-CM | POA: Diagnosis not present

## 2016-06-21 DIAGNOSIS — D721 Eosinophilia: Secondary | ICD-10-CM | POA: Diagnosis not present

## 2016-06-21 DIAGNOSIS — R918 Other nonspecific abnormal finding of lung field: Secondary | ICD-10-CM | POA: Diagnosis not present

## 2016-06-21 DIAGNOSIS — Z79899 Other long term (current) drug therapy: Secondary | ICD-10-CM | POA: Diagnosis not present

## 2016-06-22 ENCOUNTER — Ambulatory Visit
Admission: RE | Admit: 2016-06-22 | Discharge: 2016-06-22 | Disposition: A | Discharge status: Home / Self Care | Payer: Medicare Other | Source: Ambulatory Visit | Attending: Rheumatology | Admitting: Rheumatology

## 2016-06-22 ENCOUNTER — Other Ambulatory Visit: Payer: Self-pay | Admitting: Rheumatology

## 2016-06-22 DIAGNOSIS — R918 Other nonspecific abnormal finding of lung field: Secondary | ICD-10-CM

## 2016-06-22 DIAGNOSIS — M301 Polyarteritis with lung involvement [Churg-Strauss]: Secondary | ICD-10-CM

## 2016-06-22 DIAGNOSIS — D7218 Eosinophilia in diseases classified elsewhere: Secondary | ICD-10-CM

## 2016-07-10 DIAGNOSIS — R05 Cough: Secondary | ICD-10-CM | POA: Diagnosis not present

## 2016-07-10 DIAGNOSIS — R634 Abnormal weight loss: Secondary | ICD-10-CM | POA: Diagnosis not present

## 2016-07-10 DIAGNOSIS — M301 Polyarteritis with lung involvement [Churg-Strauss]: Secondary | ICD-10-CM | POA: Diagnosis not present

## 2016-07-21 ENCOUNTER — Encounter: Payer: Self-pay | Admitting: Internal Medicine

## 2016-07-21 ENCOUNTER — Telehealth: Payer: Self-pay | Admitting: Internal Medicine

## 2016-07-21 NOTE — Telephone Encounter (Signed)
Called patient to schedule awv. Lvm for patient to call office to schedule appt.  °

## 2016-07-24 ENCOUNTER — Encounter: Payer: Self-pay | Admitting: Internal Medicine

## 2016-07-25 ENCOUNTER — Other Ambulatory Visit: Payer: Self-pay | Admitting: Internal Medicine

## 2016-07-25 DIAGNOSIS — M301 Polyarteritis with lung involvement [Churg-Strauss]: Principal | ICD-10-CM

## 2016-07-31 DIAGNOSIS — Z79899 Other long term (current) drug therapy: Secondary | ICD-10-CM | POA: Diagnosis not present

## 2016-08-02 DIAGNOSIS — R7989 Other specified abnormal findings of blood chemistry: Secondary | ICD-10-CM | POA: Diagnosis not present

## 2016-08-02 DIAGNOSIS — J82 Pulmonary eosinophilia, not elsewhere classified: Secondary | ICD-10-CM | POA: Diagnosis not present

## 2016-08-02 DIAGNOSIS — Z79899 Other long term (current) drug therapy: Secondary | ICD-10-CM | POA: Diagnosis not present

## 2016-08-02 DIAGNOSIS — M301 Polyarteritis with lung involvement [Churg-Strauss]: Secondary | ICD-10-CM | POA: Diagnosis not present

## 2016-08-02 DIAGNOSIS — D696 Thrombocytopenia, unspecified: Secondary | ICD-10-CM | POA: Diagnosis not present

## 2016-08-24 ENCOUNTER — Telehealth: Payer: Self-pay | Admitting: Internal Medicine

## 2016-08-24 NOTE — Telephone Encounter (Signed)
Rec'd from Hurley Cisco MD forward 2 pages to Dr. Scarlette Calico

## 2016-08-24 NOTE — Telephone Encounter (Signed)
Rec'd from Hurley Cisco MD forward 2 pages to Dr. Baird Lyons

## 2016-09-01 DIAGNOSIS — M301 Polyarteritis with lung involvement [Churg-Strauss]: Secondary | ICD-10-CM | POA: Diagnosis not present

## 2016-09-01 DIAGNOSIS — M810 Age-related osteoporosis without current pathological fracture: Secondary | ICD-10-CM | POA: Diagnosis not present

## 2016-09-01 DIAGNOSIS — M150 Primary generalized (osteo)arthritis: Secondary | ICD-10-CM | POA: Diagnosis not present

## 2016-09-01 LAB — CBC AND DIFFERENTIAL
HEMATOCRIT: 37 — AB (ref 41–53)
HEMOGLOBIN: 12.4 — AB (ref 13.5–17.5)
Neutrophils Absolute: 2
PLATELETS: 187 (ref 150–399)
WBC: 3

## 2016-09-01 LAB — BASIC METABOLIC PANEL
Creatinine: 0.7 (ref 0.6–1.3)
Potassium: 4.1 (ref 3.4–5.3)
SODIUM: 141 (ref 137–147)

## 2016-09-01 LAB — HEPATIC FUNCTION PANEL
ALT: 28 (ref 10–40)
AST: 36 (ref 14–40)
Alkaline Phosphatase: 48 (ref 25–125)
BILIRUBIN, TOTAL: 1.2

## 2016-10-10 ENCOUNTER — Encounter: Payer: Self-pay | Admitting: Internal Medicine

## 2016-10-10 NOTE — Progress Notes (Unsigned)
Results entered and sent to scan  

## 2016-10-30 DIAGNOSIS — Z6822 Body mass index (BMI) 22.0-22.9, adult: Secondary | ICD-10-CM | POA: Diagnosis not present

## 2016-10-30 DIAGNOSIS — M301 Polyarteritis with lung involvement [Churg-Strauss]: Secondary | ICD-10-CM | POA: Diagnosis not present

## 2016-10-30 DIAGNOSIS — M810 Age-related osteoporosis without current pathological fracture: Secondary | ICD-10-CM | POA: Diagnosis not present

## 2016-10-30 DIAGNOSIS — M150 Primary generalized (osteo)arthritis: Secondary | ICD-10-CM | POA: Diagnosis not present

## 2016-11-13 ENCOUNTER — Other Ambulatory Visit (INDEPENDENT_AMBULATORY_CARE_PROVIDER_SITE_OTHER): Payer: Medicare Other

## 2016-11-13 ENCOUNTER — Encounter: Payer: Self-pay | Admitting: Internal Medicine

## 2016-11-13 ENCOUNTER — Ambulatory Visit (INDEPENDENT_AMBULATORY_CARE_PROVIDER_SITE_OTHER): Payer: Medicare Other | Admitting: Internal Medicine

## 2016-11-13 VITALS — BP 118/66 | HR 64 | Ht 73.0 in | Wt 167.0 lb

## 2016-11-13 DIAGNOSIS — J82 Pulmonary eosinophilia, not elsewhere classified: Secondary | ICD-10-CM

## 2016-11-13 DIAGNOSIS — R918 Other nonspecific abnormal finding of lung field: Secondary | ICD-10-CM

## 2016-11-13 DIAGNOSIS — J45909 Unspecified asthma, uncomplicated: Secondary | ICD-10-CM | POA: Diagnosis not present

## 2016-11-13 DIAGNOSIS — J328 Other chronic sinusitis: Secondary | ICD-10-CM | POA: Diagnosis not present

## 2016-11-13 DIAGNOSIS — M301 Polyarteritis with lung involvement [Churg-Strauss]: Secondary | ICD-10-CM

## 2016-11-13 DIAGNOSIS — J8283 Eosinophilic asthma: Secondary | ICD-10-CM

## 2016-11-13 DIAGNOSIS — D7218 Eosinophilia in diseases classified elsewhere: Secondary | ICD-10-CM

## 2016-11-13 LAB — BASIC METABOLIC PANEL
BUN: 14 mg/dL (ref 6–23)
CHLORIDE: 104 meq/L (ref 96–112)
CO2: 31 mEq/L (ref 19–32)
Calcium: 9.9 mg/dL (ref 8.4–10.5)
Creatinine, Ser: 0.7 mg/dL (ref 0.40–1.50)
GFR: 117.5 mL/min (ref >60.00)
Glucose, Bld: 93 mg/dL (ref 70–99)
POTASSIUM: 3.7 meq/L (ref 3.5–5.1)
SODIUM: 140 meq/L (ref 135–145)

## 2016-11-13 NOTE — Patient Instructions (Signed)
Order- schedule CT chest with contrast     Dx eosinophilic angiitis, lung nodules, asthma              BMET   If the CT scan is looking good, I think Dr Amil Amen may feel he can reduce your immuno-suppresive therapy.

## 2016-11-13 NOTE — Progress Notes (Signed)
Subjective:    Patient ID: Lee Blevins, male    DOB: 08/13/1945, 71 y.o.   MRN: [id number]  HPI male former smoker followed for asthma/bronchitis, eosinophilia, lung nodules, chronic sinusitis, nasal polyps/Churg-Strauss/Cytoxan, chronic hypoxic respiratory failure, complicated by history CVA, HBP, CAD Barium swallow negative for reflux, incidental small diverticulum Sputum culture from 09/06/2015 had grown Pseudomonas sensitive to Cipro, QuantiFERON gold Neg 2017 Labs 10/08/2015-sputum culture normal flora, mold IgE panel all negative, CF panel negative, elevated IgE for cat, dog, grass, tree pollens Churg-Strauss- Dx by Dr Truslow/ Rheumatology based on eosinophilia, chronic asthma, chronic sinus disease, no path biopsy. Started cytoxan 02/02/16  ------------------------------------------------------------  05/16/2016-71 year old male former smoker followed for asthma/bronchitis, eosinophilia, lung nodules, chronic sinusitis, nasal polyps/Churg-Strauss/Cytoxan, chronic hypoxic respiratory failure, complicated by history CVA, HBP, CAD FOLLOWS FOR: pt c/o stable sob with exertion, prod cough with clear mucus worse qam.   Slowly tapering prednisone/Cytoxan guided by Dr. Charlestine Night. Denies wheeze and has only minimal morning cough with scant clear sputum. Has continued Advair twice daily without using rescue inhaler in a long time. Body weight back to normal. Denies rash, significant joint pain, adenopathy, fever CXR- 04/18/16 IMPRESSION: Improvement in right lower lobe airspace disease. Coronary artery disease.  11/13/16-71 year old male former smoker followed for asthma/bronchitis, eosinophilia, lung nodules, chronic sinusitis, nasal polyps/Churg-Strauss/Cytoxan, chronic hypoxic respiratory failure, complicated by history CVA, HBP, CAD FOLLOWS FOR: Pt states his breathing is doing okay since last visit. Denies any wheezing, cough,congestion, or SOB at this time.  On Imuran 100 mg daily,  prednisone 7.5 mg daily. Managed by Dr Amil Amen Rheum. He denies nasal congestion cough or wheeze. Looking to [place] for update CT scan, watching evidence of inflammation. Anemia-hemoglobin 12.4/crit 37 on 5/18  LFT nl CXR 06/22/16  IMPRESSION: No acute cardiopulmonary abnormality Thoracic aortic atherosclerosis.  ROS-see HPI Constitutional:   No-   weight loss, night sweats, fevers, chills, fatigue, lassitude. HEENT:   No-  headaches, difficulty swallowing, tooth/dental problems, sore throat,       No-  sneezing, itching, ear ache, nasal congestion, post nasal drip,  CV:  No-   chest pain, orthopnea, PND, swelling in lower extremities, anasarca, dizziness, palpitations Resp: + shortness of breath with exertion or at rest.              productive cough,  No non-productive cough,  No- coughing up of blood.              No-   change in color of mucus.  No- wheezing.   Skin: No-   rash or lesions. GI:  No-   heartburn, indigestion, abdominal pain, nausea, vomiting,  GU:  MS:  No-   joint pain or swelling.   Neuro-     nothing unusual Psych:  No- change in mood or affect. No depression or anxiety.  No memory loss.  Objective:   Physical Exam General- Alert, Oriented, Affect-appropriate, Distress- none acute, trim, Looks Well Skin- rash-none, lesions- none, excoriation- none Lymphadenopathy- none Head- atraumatic            Eyes- Gross vision intact, PERRLA, conjunctivae clear secretions            Ears- Hearing, canals normal            Nose- Clear, No- Septal dev, mucus -none, polyp-none seen, no-erosion, perforation             Throat- Mallampati II-III , mucosa clear , drainage- none, tonsils- atrophic Neck- flexible , trachea midline, no stridor ,  thyroid nl, carotid no bruit Chest - symmetrical excursion , unlabored           Heart/CV- RRR , no murmur , no gallop  , no rub, nl s1 s2                           - JVD- none , edema- none, stasis changes- none, varices- none            Lung-  wheeze-none , cough- none , dullness-none, rub- none, unlabored           Chest wall-  Abd-  Br/ Gen/ Rectal- Not done, not indicated Extrem- cyanosis- none, clubbing, none, atrophy- none, strength- nl Neuro- +Occasional twitch or jerk  Assessment & Plan:

## 2016-11-17 ENCOUNTER — Ambulatory Visit (INDEPENDENT_AMBULATORY_CARE_PROVIDER_SITE_OTHER)
Admission: RE | Admit: 2016-11-17 | Discharge: 2016-11-17 | Disposition: A | Discharge status: Home / Self Care | Payer: Medicare Other | Source: Ambulatory Visit | Attending: Internal Medicine | Admitting: Internal Medicine

## 2016-11-17 DIAGNOSIS — J82 Pulmonary eosinophilia, not elsewhere classified: Secondary | ICD-10-CM | POA: Diagnosis not present

## 2016-11-17 DIAGNOSIS — J45909 Unspecified asthma, uncomplicated: Secondary | ICD-10-CM

## 2016-11-17 DIAGNOSIS — R918 Other nonspecific abnormal finding of lung field: Secondary | ICD-10-CM

## 2016-11-17 DIAGNOSIS — J8283 Eosinophilic asthma: Secondary | ICD-10-CM

## 2016-11-17 MED ORDER — IOPAMIDOL (ISOVUE-300) INJECTION 61%
80.0000 mL | Freq: Once | INTRAVENOUS | Status: AC | PRN
  Administered 2016-11-17: 80 mL via INTRAVENOUS

## 2016-11-20 NOTE — Assessment & Plan Note (Signed)
This is still working diagnosis. Followed now by Dr. Beekman/Rheumatology on immunosuppressive therapy. Symptoms are well controlled. Plan-CT chest with contrast to reevaluate lung parenchyma for comparison with 2017

## 2016-11-20 NOTE — Assessment & Plan Note (Signed)
Nasal symptoms have largely resolved. Years ago he had recurrent nasal polyps which have largely faded as his disease involved.

## 2016-11-20 NOTE — Assessment & Plan Note (Signed)
We are updating CT chest for comparison, watching for progression of nodules and chronic bronchitis changes.

## 2017-01-30 DIAGNOSIS — M150 Primary generalized (osteo)arthritis: Secondary | ICD-10-CM | POA: Diagnosis not present

## 2017-01-30 DIAGNOSIS — M301 Polyarteritis with lung involvement [Churg-Strauss]: Secondary | ICD-10-CM | POA: Diagnosis not present

## 2017-01-30 DIAGNOSIS — D72818 Other decreased white blood cell count: Secondary | ICD-10-CM | POA: Diagnosis not present

## 2017-01-30 DIAGNOSIS — M810 Age-related osteoporosis without current pathological fracture: Secondary | ICD-10-CM | POA: Diagnosis not present

## 2017-01-30 DIAGNOSIS — Z6821 Body mass index (BMI) 21.0-21.9, adult: Secondary | ICD-10-CM | POA: Diagnosis not present

## 2017-03-02 DIAGNOSIS — Z23 Encounter for immunization: Secondary | ICD-10-CM | POA: Diagnosis not present

## 2017-03-22 ENCOUNTER — Other Ambulatory Visit: Payer: Self-pay

## 2017-03-28 ENCOUNTER — Other Ambulatory Visit: Payer: Self-pay | Admitting: Internal Medicine

## 2017-05-02 DIAGNOSIS — M150 Primary generalized (osteo)arthritis: Secondary | ICD-10-CM | POA: Diagnosis not present

## 2017-05-02 DIAGNOSIS — M810 Age-related osteoporosis without current pathological fracture: Secondary | ICD-10-CM | POA: Diagnosis not present

## 2017-05-02 DIAGNOSIS — Z6821 Body mass index (BMI) 21.0-21.9, adult: Secondary | ICD-10-CM | POA: Diagnosis not present

## 2017-05-02 DIAGNOSIS — D72818 Other decreased white blood cell count: Secondary | ICD-10-CM | POA: Diagnosis not present

## 2017-05-02 DIAGNOSIS — M301 Polyarteritis with lung involvement [Churg-Strauss]: Secondary | ICD-10-CM | POA: Diagnosis not present

## 2017-05-02 LAB — CBC AND DIFFERENTIAL
HCT: 35 — AB (ref 41–53)
Hemoglobin: 12.2 — AB (ref 13.5–17.5)
Neutrophils Absolute: 1
PLATELETS: 179 (ref 150–399)
WBC: 2.2

## 2017-05-16 ENCOUNTER — Encounter: Payer: Self-pay | Admitting: Internal Medicine

## 2017-05-16 ENCOUNTER — Ambulatory Visit (INDEPENDENT_AMBULATORY_CARE_PROVIDER_SITE_OTHER): Payer: Medicare Other | Admitting: Internal Medicine

## 2017-05-16 ENCOUNTER — Ambulatory Visit (INDEPENDENT_AMBULATORY_CARE_PROVIDER_SITE_OTHER)
Admission: RE | Admit: 2017-05-16 | Discharge: 2017-05-16 | Disposition: A | Discharge status: Home / Self Care | Payer: Medicare Other | Source: Ambulatory Visit | Attending: Internal Medicine | Admitting: Internal Medicine

## 2017-05-16 VITALS — BP 118/70 | HR 81 | Ht 73.0 in | Wt 154.0 lb

## 2017-05-16 DIAGNOSIS — R918 Other nonspecific abnormal finding of lung field: Secondary | ICD-10-CM | POA: Diagnosis not present

## 2017-05-16 DIAGNOSIS — D7218 Eosinophilia in diseases classified elsewhere: Secondary | ICD-10-CM

## 2017-05-16 DIAGNOSIS — M301 Polyarteritis with lung involvement [Churg-Strauss]: Secondary | ICD-10-CM

## 2017-05-16 DIAGNOSIS — J330 Polyp of nasal cavity: Secondary | ICD-10-CM | POA: Diagnosis not present

## 2017-05-16 DIAGNOSIS — J4540 Moderate persistent asthma, uncomplicated: Secondary | ICD-10-CM | POA: Diagnosis not present

## 2017-05-16 NOTE — Assessment & Plan Note (Signed)
No recurrence on current therapy

## 2017-05-16 NOTE — Assessment & Plan Note (Signed)
Anticipate Dr Amil Amen will gradually back off immunosuppressive therapy over time. Hopefull respiratory symptoms will not return. He has been doing very well.

## 2017-05-16 NOTE — Assessment & Plan Note (Signed)
Intermittent, uncomplicated. Not using inhalers much. Unclear if prednisone and Imuran are affecting this.

## 2017-05-16 NOTE — Progress Notes (Signed)
Subjective:    Patient ID: Lee Blevins, male    DOB: [birth year], 72 y.o.   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 Started cytoxan 02/02/16  ------------------------------------------------------------ 11/13/16-72 year old male former smoker followed for asthma/bronchitis, eosinophilia, lung nodules, chronic sinusitis, nasal polyps/Churg-Strauss/Cytoxan, chronic hypoxic respiratory failure, complicated by history CVA, HBP, CAD FOLLOWS FOR: Pt states his breathing is doing okay since last visit. Denies any wheezing, cough,congestion, or SOB at this time.  On Imuran 100 mg daily, prednisone 7.5 mg daily. Managed by Dr Amil Amen Rheum. He denies nasal congestion cough or wheeze. Looking to [place] for update CT scan, watching evidence of inflammation. Anemia-hemoglobin 12.4/crit 37 on 5/18  LFT nl CXR 06/22/16  IMPRESSION: No acute cardiopulmonary abnormality Thoracic aortic atherosclerosis.  05/16/17- 73 year old male former smoker followed for asthma/bronchitis, eosinophilia, lung nodules, chronic sinusitis, nasal polyps/Churg-Strauss/Cytoxan, chronic hypoxic respiratory failure, complicated by history CVA, HBP, CAD Imuran-Dr. Beekman/rheumatology ----Lung Nodule: Pt states he is doing well since last visit. Denies any wheezing, cough, SOB, or congestion.  Prednisone 5 mg, Singulair 10 mg,  Flonase, Imuran, albuterol HFA, Advair 250 Recent dental extraction with post implants. Continues prednisone 5 mg daily, Imuran 50 mg daily.  Being tracked by rheumatology for low WBC.  Says breathing very good-both sinuses and chest.  Uses Flonase about 3 times a week, Advair about 3 times a week and not using rescue inhaler at all.  No longer has home oxygen. Lung nodules improved, c/w inflammatory nodules- discussed. CT chest 11/17/16 IMPRESSION: Coronary artery calcifications are noted suggesting coronary artery disease. Ground-glass opacities noted in right lower lobe on prior exam have resolved. Two small nodules remain posteriorly in the right lower lobe, the largest measuring 4 mm. These appear to be smaller compared to prior exam, most likely represent sequela of previous inflammation. These can be considered benign at this point with no further follow-up required. Aortic Atherosclerosis (ICD10-I70.0).  ROS-see HPI Constitutional:   No-   weight loss, night sweats, fevers, chills, fatigue, lassitude. HEENT:   No-  headaches, difficulty swallowing, tooth/dental problems, sore throat,       No-  sneezing, itching, ear ache, nasal congestion, post nasal drip,  CV:  No-   chest pain, orthopnea, PND, swelling in lower extremities, anasarca, dizziness, palpitations Resp: + shortness of breath with exertion or at rest.              productive cough,  No non-productive cough,  No- coughing up of blood.              No-   change in color of mucus.  No- wheezing.   Skin: No-   rash or lesions. GI:  No-   heartburn, indigestion, abdominal pain, nausea, vomiting,  GU:  MS:  No-   joint pain or swelling.   Neuro-     nothing unusual Psych:  No- change in mood or affect. No depression or anxiety.  No memory loss.  Objective:   Physical Exam General- Alert, Oriented, Affect-appropriate, Distress- none acute, thin Skin- rash-none, lesions-  none, excoriation- none Lymphadenopathy-  none Head- atraumatic            Eyes- Gross vision intact, PERRLA, conjunctivae clear secretions            Ears- Hearing, canals normal            Nose- Clear, No- Septal dev, mucus -none, polyp-none seen, no-erosion, perforation             Throat- Mallampati II-III , mucosa clear , drainage- none, tonsils- atrophic, missing teeth. Neck- flexible , trachea midline, no stridor , thyroid nl, carotid no bruit Chest - symmetrical excursion , unlabored           Heart/CV- RRR , no murmur , no gallop  , no rub, nl s1 s2                           - JVD- none , edema- none, stasis changes- none, varices- none           Lung-  wheeze-none , cough- none , dullness-none, rub- none, unlabored           Chest wall-  Abd-  Br/ Gen/ Rectal- Not done, not indicated Extrem- cyanosis- none, clubbing, none, atrophy- none, strength- nl Neuro- +Occasional twitch or jerk  Assessment & Plan:

## 2017-05-16 NOTE — Assessment & Plan Note (Signed)
Improved on CT 6 months ago  [place]

## 2017-05-16 NOTE — Patient Instructions (Addendum)
Glad your breathing is doing so well   Continue working with Dr Amil Amen  Order- CXR dx lung nodules, Churg-Strauss

## 2017-05-18 ENCOUNTER — Encounter: Payer: Self-pay | Admitting: Internal Medicine

## 2017-05-18 NOTE — Progress Notes (Signed)
Result Abstracted and sent to scan.

## 2017-05-31 DIAGNOSIS — D72818 Other decreased white blood cell count: Secondary | ICD-10-CM | POA: Diagnosis not present

## 2017-07-31 DIAGNOSIS — M150 Primary generalized (osteo)arthritis: Secondary | ICD-10-CM | POA: Diagnosis not present

## 2017-07-31 DIAGNOSIS — Z6821 Body mass index (BMI) 21.0-21.9, adult: Secondary | ICD-10-CM | POA: Diagnosis not present

## 2017-07-31 DIAGNOSIS — D72818 Other decreased white blood cell count: Secondary | ICD-10-CM | POA: Diagnosis not present

## 2017-07-31 DIAGNOSIS — M301 Polyarteritis with lung involvement [Churg-Strauss]: Secondary | ICD-10-CM | POA: Diagnosis not present

## 2017-07-31 DIAGNOSIS — M810 Age-related osteoporosis without current pathological fracture: Secondary | ICD-10-CM | POA: Diagnosis not present

## 2017-09-01 ENCOUNTER — Other Ambulatory Visit: Payer: Self-pay | Admitting: Internal Medicine

## 2017-11-05 DIAGNOSIS — M150 Primary generalized (osteo)arthritis: Secondary | ICD-10-CM | POA: Diagnosis not present

## 2017-11-05 DIAGNOSIS — D72818 Other decreased white blood cell count: Secondary | ICD-10-CM | POA: Diagnosis not present

## 2017-11-05 DIAGNOSIS — Z6821 Body mass index (BMI) 21.0-21.9, adult: Secondary | ICD-10-CM | POA: Diagnosis not present

## 2017-11-05 DIAGNOSIS — M810 Age-related osteoporosis without current pathological fracture: Secondary | ICD-10-CM | POA: Diagnosis not present

## 2017-11-05 DIAGNOSIS — M301 Polyarteritis with lung involvement [Churg-Strauss]: Secondary | ICD-10-CM | POA: Diagnosis not present

## 2017-11-13 ENCOUNTER — Ambulatory Visit: Payer: Medicare Other | Admitting: Internal Medicine

## 2018-02-05 DIAGNOSIS — D72818 Other decreased white blood cell count: Secondary | ICD-10-CM | POA: Diagnosis not present

## 2018-02-05 DIAGNOSIS — M810 Age-related osteoporosis without current pathological fracture: Secondary | ICD-10-CM | POA: Diagnosis not present

## 2018-02-05 DIAGNOSIS — Z6821 Body mass index (BMI) 21.0-21.9, adult: Secondary | ICD-10-CM | POA: Diagnosis not present

## 2018-02-05 DIAGNOSIS — M301 Polyarteritis with lung involvement [Churg-Strauss]: Secondary | ICD-10-CM | POA: Diagnosis not present

## 2018-02-05 DIAGNOSIS — Z23 Encounter for immunization: Secondary | ICD-10-CM | POA: Diagnosis not present

## 2018-02-05 DIAGNOSIS — M150 Primary generalized (osteo)arthritis: Secondary | ICD-10-CM | POA: Diagnosis not present

## 2018-02-11 ENCOUNTER — Ambulatory Visit (INDEPENDENT_AMBULATORY_CARE_PROVIDER_SITE_OTHER): Payer: Medicare Other | Admitting: Internal Medicine

## 2018-02-11 ENCOUNTER — Encounter: Payer: Self-pay | Admitting: Internal Medicine

## 2018-02-11 VITALS — BP 122/76 | HR 78 | Ht 73.0 in | Wt 162.0 lb

## 2018-02-11 DIAGNOSIS — R918 Other nonspecific abnormal finding of lung field: Secondary | ICD-10-CM | POA: Diagnosis not present

## 2018-02-11 DIAGNOSIS — J330 Polyp of nasal cavity: Secondary | ICD-10-CM | POA: Diagnosis not present

## 2018-02-11 DIAGNOSIS — D7218 Eosinophilia in diseases classified elsewhere: Secondary | ICD-10-CM

## 2018-02-11 DIAGNOSIS — M301 Polyarteritis with lung involvement [Churg-Strauss]: Secondary | ICD-10-CM

## 2018-02-11 NOTE — Patient Instructions (Signed)
Ok to  continue current meds  Please call if we can help   

## 2018-02-11 NOTE — Progress Notes (Signed)
Subjective:    Patient ID: Lee Blevins, male    DOB: [birth year], 72 y.o.   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 Started cytoxan 02/02/16  ------------------------------------------------------------  05/16/17- 72 year old male former smoker followed for asthma/bronchitis, eosinophilia, lung nodules, chronic sinusitis, nasal polyps/Churg-Strauss/Cytoxan, chronic hypoxic respiratory failure, complicated by history CVA, HBP, CAD Imuran-Dr. Beekman/rheumatology ----Lung Nodule: Pt states he is doing well since last visit. Denies any wheezing, cough, SOB, or congestion.  Prednisone 5 mg, Singulair 10 mg, Flonase, Imuran, albuterol HFA, Advair 250 Recent dental extraction with post implants. Continues prednisone 5 mg daily, Imuran 50 mg daily.  Being tracked by rheumatology for low WBC.  Says breathing very good-both sinuses and chest.  Uses Flonase about 3 times a week, Advair about 3 times a week and not using rescue inhaler at all.  No longer has home oxygen. Lung nodules improved, c/w inflammatory nodules- discussed. CT chest 11/17/16 IMPRESSION: Coronary artery calcifications are noted suggesting coronary artery disease. Ground-glass opacities noted in right lower lobe on prior exam have resolved. Two small nodules remain posteriorly in the right lower lobe, the largest measuring 4  mm. These appear to be smaller compared to prior exam, most likely represent sequela of previous inflammation. These can be considered benign at this point with no further follow-up required. Aortic Atherosclerosis (ICD10-I70.0).  02/11/2018- 72 year old male former smoker followed for asthma/bronchitis, eosinophilia, lung nodules, chronic sinusitis, nasal polyps/Churg-Strauss/Cytoxan, chronic hypoxic respiratory failure, complicated by history CVA, HBP, CAD Imuran-Dr. Beekman/rheumatology -----Lung Nodules: Pt states his breathing is doing well overall.  Advair 250, Singulair 10 mg, albuterol HFA, Flonase, prednisone 5 mg daily, Imuran 50 mg daily Rheumatology is managing anti-inflammatory therapy with intent to slowly taper prednisone.  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 Breathing has been good-sinuses and chest feels clear and well.  He is not needed a rescue inhaler in a long time and uses Advair only for short intervals occasionally. CXR 05/16/17- IMPRESSION: 1. Hyperinflation, without acute disease. 2.  Aortic Atherosclerosis (ICD10-I70.0). 3. The right lower lobe diminutive pulmonary nodules described on prior CT would not be plain film evident.  ROS-see HPI    + = positive Constitutional:   No-   weight loss, night sweats, fevers, chills, fatigue, lassitude. HEENT:   No-  headaches, difficulty swallowing, tooth/dental problems, sore throat,       No-  sneezing, itching, ear ache, nasal congestion, post nasal drip,  CV:  No-   chest pain, orthopnea, PND, swelling in lower extremities, anasarca, dizziness, palpitations Resp:  shortness of breath with exertion or at rest.              productive cough,  No non-productive cough,  No- coughing up of blood.              No-   change in color of mucus.  No- wheezing.   Skin: No-   rash or lesions. GI:  No-   heartburn, indigestion, abdominal pain, nausea, vomiting,  GU:  MS:   No-   joint pain or swelling.   Neuro-     nothing unusual Psych:  No- change in mood or affect. No depression or anxiety.  No memory loss.  Objective:   Physical Exam General- Alert, Oriented, Affect-appropriate, Distress- none acute, thin Skin- rash-none, lesions- none, excoriation- none Lymphadenopathy- none Head- atraumatic            Eyes- Gross vision intact, PERRLA, conjunctivae clear secretions            Ears- Hearing, canals normal            Nose- Clear, No- Septal dev, mucus -none, polyp-none seen, no-erosion, perforation             Throat- Mallampati II-III , mucosa+mild erythema posterior pharynx, drainage- none, tonsils- atrophic, missing teeth. Neck- flexible , trachea midline, no stridor , thyroid nl, carotid no bruit Chest - symmetrical excursion , unlabored           Heart/CV- RRR , no murmur , no gallop  , no rub, nl s1 s2                           - JVD- none , edema- none, stasis changes- none, varices- none           Lung-  wheeze-none , cough- none , dullness-none, rub- none, unlabored           Chest wall-  Abd-  Br/ Gen/ Rectal- Not done, not indicated Extrem- cyanosis- none, clubbing, none, atrophy- none, strength- nl Neuro- +Occasional twitch or jerk  Assessment & Plan:

## 2018-03-06 ENCOUNTER — Other Ambulatory Visit: Payer: Self-pay

## 2018-03-25 NOTE — Assessment & Plan Note (Signed)
No visible recurrence now.  Hopefully this is a marker indicating good control of the rest of his airway inflammatory process.

## 2018-03-25 NOTE — Assessment & Plan Note (Signed)
As of last CT scan these look consistent with gradually resolving inflammatory nodules.  Following with CXR, does not indicate growth in size to a level visible with this imaging.  We are interested in minimizing radiation and cost as appropriate.

## 2018-03-25 NOTE — Assessment & Plan Note (Signed)
Now being followed by Dr. Beekman/Rheumatology.  He anticipates slow gradual reduction of immunotherapy.

## 2018-04-29 ENCOUNTER — Telehealth: Payer: Self-pay | Admitting: Internal Medicine

## 2018-04-29 DIAGNOSIS — M301 Polyarteritis with lung involvement [Churg-Strauss]: Secondary | ICD-10-CM | POA: Diagnosis not present

## 2018-04-29 DIAGNOSIS — Z6822 Body mass index (BMI) 22.0-22.9, adult: Secondary | ICD-10-CM | POA: Diagnosis not present

## 2018-04-29 DIAGNOSIS — M150 Primary generalized (osteo)arthritis: Secondary | ICD-10-CM | POA: Diagnosis not present

## 2018-04-29 DIAGNOSIS — M810 Age-related osteoporosis without current pathological fracture: Secondary | ICD-10-CM | POA: Diagnosis not present

## 2018-04-29 DIAGNOSIS — D72818 Other decreased white blood cell count: Secondary | ICD-10-CM | POA: Diagnosis not present

## 2018-04-29 NOTE — Telephone Encounter (Signed)
Copied from [place] [id number]. Topic: Quick Communication - See Telephone Encounter >> Apr 29, 2018  3:58 PM Bea Graff, NT wrote: CRM for notification. See Telephone encounter for: 04/29/18. Pts wife calling and states that the pt saw his rheumatologist today and requested that he contact his PCP to get an order for a bone density test. Please advise once order placed.

## 2018-04-30 NOTE — Telephone Encounter (Signed)
Left detailed message informing patient that he will need an appt. LOV was in 2017.

## 2018-05-05 ENCOUNTER — Other Ambulatory Visit: Payer: Self-pay | Admitting: Internal Medicine

## 2018-05-22 ENCOUNTER — Ambulatory Visit: Payer: Medicare Other | Admitting: Internal Medicine

## 2018-05-23 IMAGING — DX DG CHEST 2V
2 series · 2 of 2 positions shown · non-contrast
Comparison: And CT scan 09/23/2015.  Chest x-ray 09/06/2015.

CLINICAL DATA: Coughing congestion.

EXAM:
CHEST  2 VIEW

[chest pa]
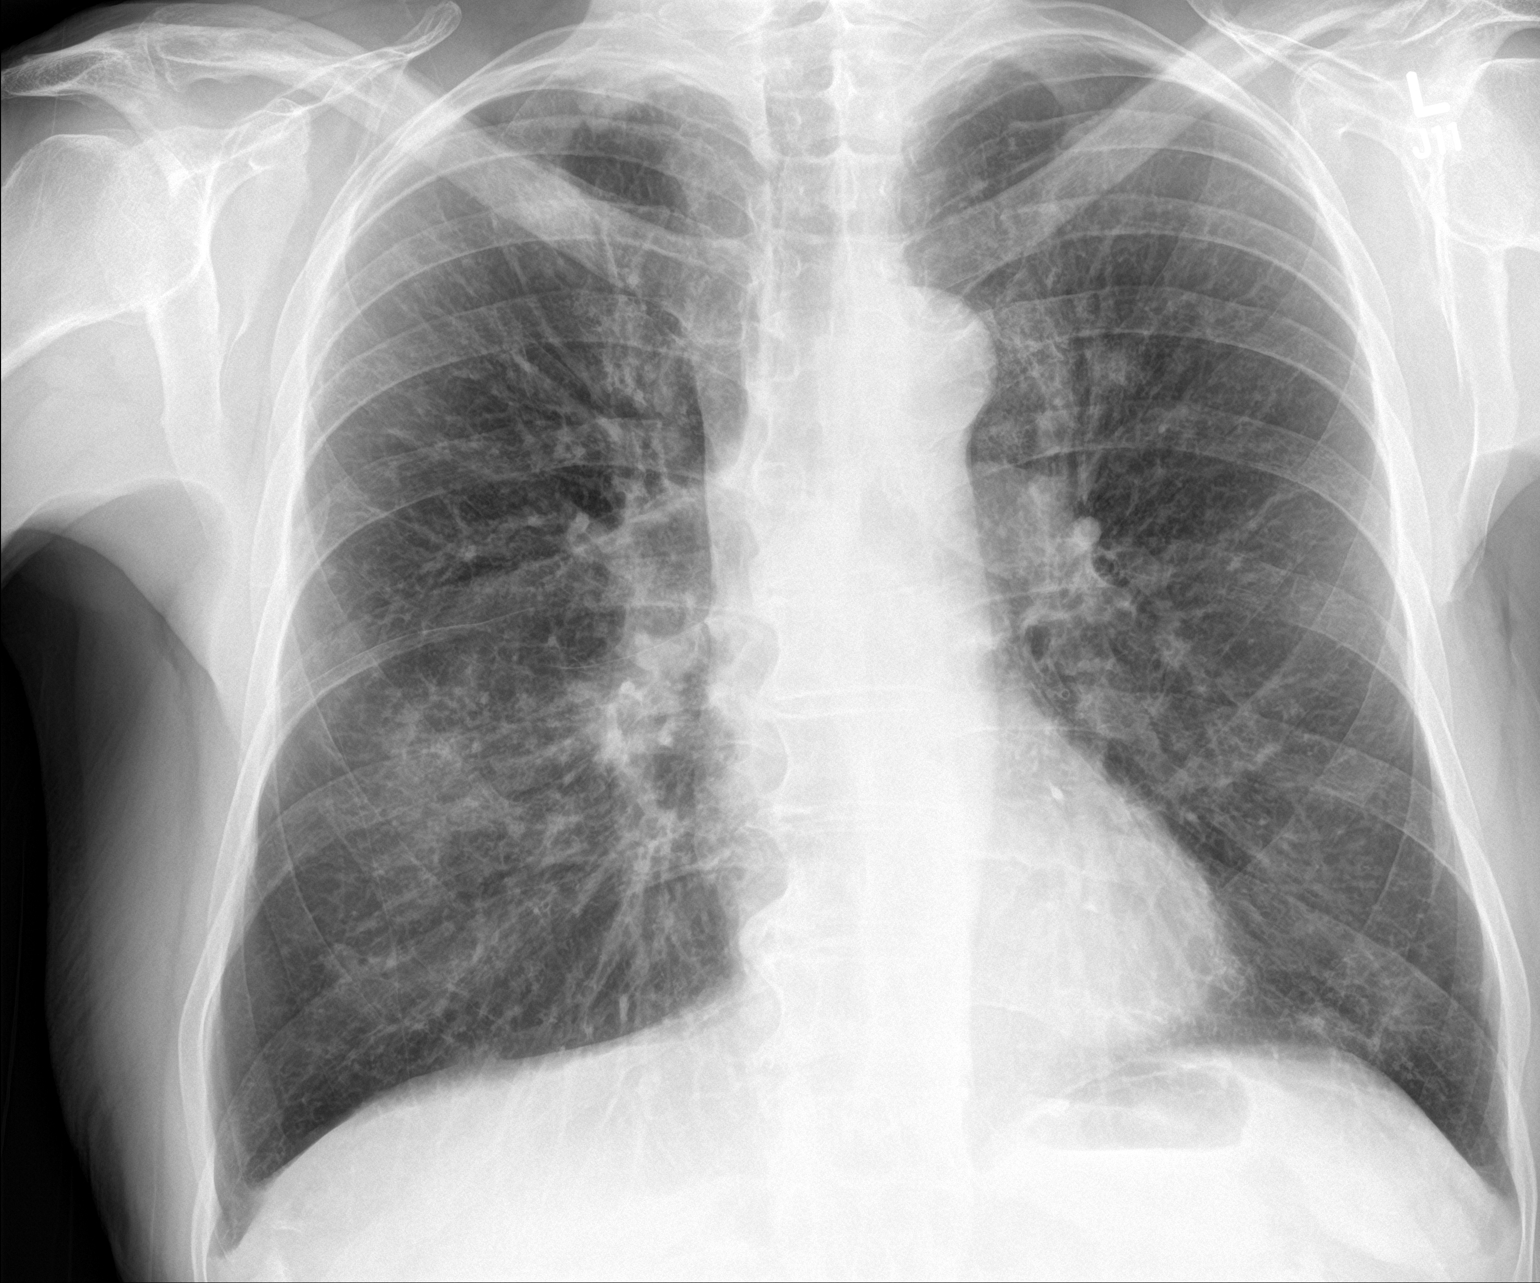

[chest lat]
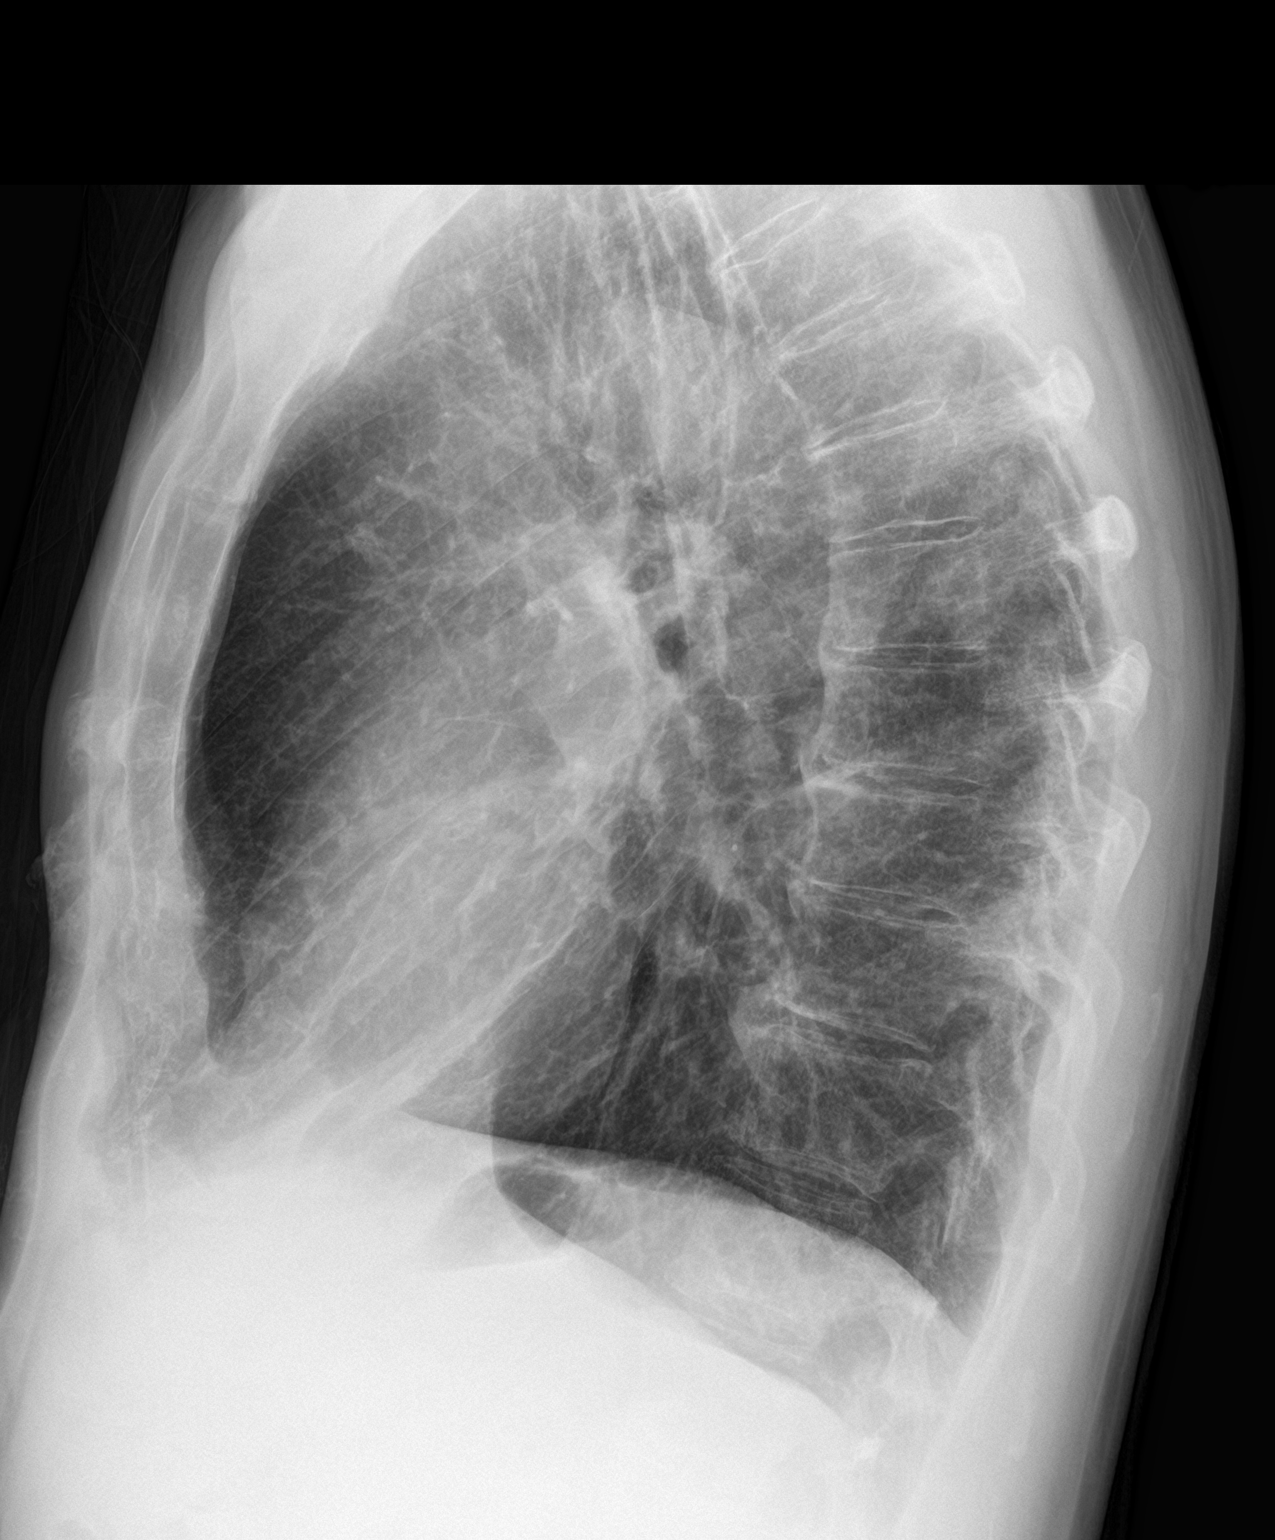

[2 of 2 positions shown; findings below may reference images not displayed]

FINDINGS: Two-view exam shows patchy airspace disease in the right lower lung.
Hyperexpansion is consistent with emphysema. Interstitial markings
are diffusely coarsened with chronic features. The cardiopericardial
silhouette is within normal limits for size. The visualized bony
structures of the thorax are intact.
IMPRESSION: Patchy airspace opacity in the right lower lung suggests pneumonia.

## 2018-06-19 ENCOUNTER — Other Ambulatory Visit (INDEPENDENT_AMBULATORY_CARE_PROVIDER_SITE_OTHER): Payer: Medicare Other

## 2018-06-19 ENCOUNTER — Ambulatory Visit (INDEPENDENT_AMBULATORY_CARE_PROVIDER_SITE_OTHER): Payer: Medicare Other | Admitting: Internal Medicine

## 2018-06-19 ENCOUNTER — Encounter: Payer: Self-pay | Admitting: Internal Medicine

## 2018-06-19 VITALS — BP 144/90 | HR 87 | Temp 97.5°F | Ht 73.0 in | Wt 166.8 lb

## 2018-06-19 DIAGNOSIS — E785 Hyperlipidemia, unspecified: Secondary | ICD-10-CM

## 2018-06-19 DIAGNOSIS — M810 Age-related osteoporosis without current pathological fracture: Secondary | ICD-10-CM | POA: Diagnosis not present

## 2018-06-19 DIAGNOSIS — I10 Essential (primary) hypertension: Secondary | ICD-10-CM | POA: Diagnosis not present

## 2018-06-19 DIAGNOSIS — D539 Nutritional anemia, unspecified: Secondary | ICD-10-CM

## 2018-06-19 DIAGNOSIS — I2510 Atherosclerotic heart disease of native coronary artery without angina pectoris: Secondary | ICD-10-CM

## 2018-06-19 DIAGNOSIS — Z0000 Encounter for general adult medical examination without abnormal findings: Secondary | ICD-10-CM

## 2018-06-19 DIAGNOSIS — I426 Alcoholic cardiomyopathy: Secondary | ICD-10-CM | POA: Diagnosis not present

## 2018-06-19 DIAGNOSIS — N401 Enlarged prostate with lower urinary tract symptoms: Secondary | ICD-10-CM | POA: Diagnosis not present

## 2018-06-19 DIAGNOSIS — R351 Nocturia: Secondary | ICD-10-CM

## 2018-06-19 LAB — CBC WITH DIFFERENTIAL/PLATELET
BASOS ABS: 0 10*3/uL (ref 0.0–0.1)
Basophils Relative: 1.1 % (ref 0.0–3.0)
Eosinophils Absolute: 0.1 10*3/uL (ref 0.0–0.7)
Eosinophils Relative: 3.8 % (ref 0.0–5.0)
HCT: 42.9 % (ref 39.0–52.0)
HEMOGLOBIN: 14.5 g/dL (ref 13.0–17.0)
Lymphocytes Relative: 17.3 % (ref 12.0–46.0)
Lymphs Abs: 0.6 10*3/uL — ABNORMAL LOW (ref 0.7–4.0)
MCHC: 33.7 g/dL (ref 30.0–36.0)
MCV: 104.4 fl — ABNORMAL HIGH (ref 78.0–100.0)
MONO ABS: 0.4 10*3/uL (ref 0.1–1.0)
Monocytes Relative: 10.6 % (ref 3.0–12.0)
NEUTROS PCT: 67.2 % (ref 43.0–77.0)
Neutro Abs: 2.3 10*3/uL (ref 1.4–7.7)
Platelets: 185 10*3/uL (ref 150.0–400.0)
RBC: 4.11 Mil/uL — AB (ref 4.22–5.81)
RDW: 14.9 % (ref 11.5–15.5)
WBC: 3.4 10*3/uL — AB (ref 4.0–10.5)

## 2018-06-19 LAB — URINALYSIS, ROUTINE W REFLEX MICROSCOPIC
BILIRUBIN URINE: NEGATIVE
LEUKOCYTE UA: NEGATIVE
NITRITE: NEGATIVE
PH: 5.5 (ref 5.0–8.0)
Specific Gravity, Urine: >=1.03 — AB (ref 1.000–1.030)
TOTAL PROTEIN, URINE-UPE24: NEGATIVE
Urine Glucose: NEGATIVE
Urobilinogen, UA: 0.2 (ref 0.0–1.0)

## 2018-06-19 LAB — COMPREHENSIVE METABOLIC PANEL
ALBUMIN: 4.4 g/dL (ref 3.5–5.2)
ALT: 9 U/L (ref 0–53)
AST: 17 U/L (ref 0–37)
Alkaline Phosphatase: 54 U/L (ref 39–117)
BILIRUBIN TOTAL: 1 mg/dL (ref 0.2–1.2)
BUN: 16 mg/dL (ref 6–23)
CO2: 29 mEq/L (ref 19–32)
CREATININE: 0.76 mg/dL (ref 0.40–1.50)
Calcium: 9.5 mg/dL (ref 8.4–10.5)
Chloride: 103 mEq/L (ref 96–112)
GFR: 100.1 mL/min (ref >60.00)
Glucose, Bld: 117 mg/dL — ABNORMAL HIGH (ref 70–99)
Potassium: 3.6 mEq/L (ref 3.5–5.1)
SODIUM: 139 meq/L (ref 135–145)
Total Protein: 7.3 g/dL (ref 6.0–8.3)

## 2018-06-19 LAB — VITAMIN B12: Vitamin B-12: 354 pg/mL (ref 211–911)

## 2018-06-19 LAB — FOLATE: FOLATE: 18.6 ng/mL (ref >5.9)

## 2018-06-19 LAB — LIPID PANEL
CHOLESTEROL: 266 mg/dL — AB (ref 0–200)
HDL: 124.4 mg/dL (ref >39.00)
LDL Cholesterol: 132 mg/dL — ABNORMAL HIGH (ref 0–99)
NonHDL: 141.98
TRIGLYCERIDES: 52 mg/dL (ref 0.0–149.0)
Total CHOL/HDL Ratio: 2
VLDL: 10.4 mg/dL (ref 0.0–40.0)

## 2018-06-19 LAB — PSA: PSA: 0.63 ng/mL (ref 0.10–4.00)

## 2018-06-19 LAB — VITAMIN D 25 HYDROXY (VIT D DEFICIENCY, FRACTURES): VITD: 37.9 ng/mL (ref 30.00–100.00)

## 2018-06-19 LAB — TSH: TSH: 1.62 u[IU]/mL (ref 0.35–4.50)

## 2018-06-19 LAB — FERRITIN: Ferritin: 100.4 ng/mL (ref 22.0–322.0)

## 2018-06-19 MED ORDER — OLMESARTAN MEDOXOMIL 20 MG PO TABS: 20.0000 mg | ORAL_TABLET | Freq: Every day | ORAL | 1 refills | Status: DC

## 2018-06-19 NOTE — Progress Notes (Signed)
Subjective:  Patient ID: Lee Blevins, male    DOB: 1946/03/28  Age: 73 y.o. MRN: [id number]  CC: Annual Exam; Anemia; and Hypertension   HPI Lee Blevins presents for a CPX.  It is hard to get a good history from him but it sounds like he has been feeling well recently and tells me he has had no recent episodes of CP, DOE, palpitations, edema, or fatigue.  His shortness of breath is at his baseline level.  Past Medical History:  Diagnosis Date  . Asthma   . Chronic sinusitis   . History of CVA (cerebrovascular accident) 2011   hemorrhagic  . HTN (hypertension)   . Hyperlipidemia   . Nasal polyps   . Pneumonia    Past Surgical History:  Procedure Laterality Date  . NASAL POLYP EXCISION     x2  . TONSILLECTOMY      reports that he quit smoking about 45 years ago. His smoking use included cigarettes. He has a 1.00 pack-year smoking history. He has never used smokeless tobacco. He reports current alcohol use of about 35.0 standard drinks of alcohol per week. He reports that he does not use drugs. family history includes Heart disease in his father; Neurologic Disorder (age of onset: 42) in his mother; Valvular heart disease in his father. Allergies  Allergen Reactions  . Amlodipine     edema  . Crestor [Rosuvastatin]     Muscle aches  . Livalo [Pitavastatin]     Muscle aches  . Bystolic [Nebivolol Hcl]     dizziness    Outpatient Medications Prior to Visit  Medication Sig Dispense Refill  . Alendronate Sodium (FOSAMAX PO) Take by mouth once a week.    Marland Kitchen aspirin EC 81 MG tablet Take 1 tablet (81 mg total) by mouth daily.    Marland Kitchen azaTHIOprine (IMURAN) 50 MG tablet Take 50 mg by mouth daily.     . montelukast (SINGULAIR) 10 MG tablet TAKE 1 TABLET AT BEDTIME 30 tablet 6  . predniSONE (DELTASONE) 5 MG tablet Take 5 mg by mouth daily with breakfast.    . ADVAIR DISKUS 250-50 MCG/DOSE AEPB INHALE 1 PUFF, THEN RINSE MOUTH TWICE DAILY (Patient not taking: Reported on 06/19/2018)  60 each 7  . albuterol (PROVENTIL HFA;VENTOLIN HFA) 108 (90 Base) MCG/ACT inhaler Inhale 2 puffs into the lungs every 6 (six) hours as needed for wheezing or shortness of breath. (Patient not taking: Reported on 06/19/2018) 1 Inhaler 11  . fluticasone (FLONASE) 50 MCG/ACT nasal spray USE 1 SPRAY IN EACH NOSTRIL TWICE DAILY (Patient not taking: Reported on 06/19/2018) 48 g 3   No facility-administered medications prior to visit.     ROS Review of Systems  Constitutional: Negative for appetite change, diaphoresis, fatigue and unexpected weight change.  HENT: Negative.  Negative for trouble swallowing.   Eyes: Negative.   Respiratory: Positive for shortness of breath. Negative for cough, chest tightness and wheezing.   Cardiovascular: Negative for chest pain, palpitations and leg swelling.  Gastrointestinal: Negative for abdominal pain, constipation, diarrhea and nausea.  Endocrine: Negative.   Genitourinary: Negative.  Negative for difficulty urinating, penile swelling, scrotal swelling, testicular pain and urgency.  Musculoskeletal: Negative for arthralgias and myalgias.  Skin: Negative.  Negative for color change and pallor.  Neurological: Negative.  Negative for dizziness, weakness, light-headedness and headaches.  Hematological: Negative for adenopathy. Does not bruise/bleed easily.  Psychiatric/Behavioral: Positive for confusion and decreased concentration. Negative for sleep disturbance and suicidal ideas. The patient is  not nervous/anxious.     Objective:  BP (!) 144/90 (BP Location: Left Arm, Patient Position: Sitting, Cuff Size: Normal)   Pulse 87   Temp (!) 97.5 F (36.4 C) (Oral)   Ht 6\' 1"  (1.854 m)   Wt 166 lb 12 oz (75.6 kg)   SpO2 97%   BMI 22.00 kg/m   BP Readings from Last 3 Encounters:  06/19/18 (!) 144/90  02/11/18 122/76  05/16/17 118/70    Wt Readings from Last 3 Encounters:  06/19/18 166 lb 12 oz (75.6 kg)  02/11/18 162 lb (73.5 kg)  05/16/17 154 lb (69.9  kg)    Physical Exam Vitals signs reviewed.  Constitutional:      General: He is not in acute distress.    Appearance: He is ill-appearing. He is not toxic-appearing or diaphoretic.  HENT:     Nose: Nose normal. No congestion or rhinorrhea.     Mouth/Throat:     Pharynx: Oropharynx is clear. No oropharyngeal exudate or posterior oropharyngeal erythema.  Eyes:     General: No scleral icterus.    Conjunctiva/sclera: Conjunctivae normal.  Neck:     Musculoskeletal: Normal range of motion and neck supple. No muscular tenderness.  Cardiovascular:     Rate and Rhythm: Normal rate and regular rhythm.     Heart sounds: No murmur. No gallop.      Comments: EKG -----  Sinus  Rhythm  - frequent multiform ectopic ventricular beats  # VECs = 4, # types 2 -Old inferior infarct.   ABNORMAL  Pulmonary:     Effort: Pulmonary effort is normal. No respiratory distress.     Breath sounds: No stridor. No wheezing, rhonchi or rales.  Abdominal:     General: Abdomen is flat.     Palpations: There is no hepatomegaly, splenomegaly or mass.     Tenderness: There is no abdominal tenderness. There is no guarding.     Hernia: There is no hernia in the right inguinal area or left inguinal area.  Genitourinary:    Penis: Normal and circumcised. No discharge, swelling or lesions.      Scrotum/Testes: Normal.        Right: Mass or tenderness not present.        Left: Mass or tenderness not present.     Epididymis:     Right: Normal. Not enlarged. No mass.     Left: Normal. Not enlarged. No mass.     Prostate: Enlarged (1+ smooth symm BPH). Not tender and no nodules present.     Rectum: Normal. Guaiac result negative. No mass, tenderness, anal fissure, external hemorrhoid or internal hemorrhoid. Normal anal tone.  Musculoskeletal: Normal range of motion.        General: No swelling.     Right lower leg: No edema.     Left lower leg: No edema.  Lymphadenopathy:     Cervical: No cervical adenopathy.      Lower Body: No right inguinal adenopathy. No left inguinal adenopathy.  Skin:    General: Skin is warm and dry.  Neurological:     General: No focal deficit present.     Mental Status: He is oriented to person, place, and time. Mental status is at baseline.  Psychiatric:        Attention and Perception: He is inattentive.        Mood and Affect: Affect normal. Mood is anxious.        Speech: Speech is delayed and tangential.  Behavior: Behavior normal. Behavior is cooperative.        Thought Content: Thought content normal. Thought content is not paranoid. Thought content does not include homicidal or suicidal ideation.        Cognition and Memory: Cognition is impaired. Memory is impaired. He exhibits impaired recent memory and impaired remote memory.     Lab Results  Component Value Date   WBC 3.4 (L) 06/19/2018   HGB 14.5 06/19/2018   HCT 42.9 06/19/2018   PLT 185.0 06/19/2018   GLUCOSE 117 (H) 06/19/2018   CHOL 266 (H) 06/19/2018   TRIG 52.0 06/19/2018   HDL 124.40 06/19/2018   LDLDIRECT 160.0 06/13/2013   LDLCALC 132 (H) 06/19/2018   ALT 9 06/19/2018   AST 17 06/19/2018   NA 139 06/19/2018   K 3.6 06/19/2018   CL 103 06/19/2018   CREATININE 0.76 06/19/2018   BUN 16 06/19/2018   CO2 29 06/19/2018   TSH 1.62 06/19/2018   PSA 0.63 06/19/2018   INR 1.1 (H) 12/14/2015   HGBA1C 5.5 06/08/2015    Dg Chest 2 View  Result Date: 05/16/2017 CLINICAL DATA:  Follow-up of pulmonary nodules. EXAM: CHEST  2 VIEW COMPARISON:  11/17/2016 CT.  06/22/2016 chest radiograph. FINDINGS: Mild hyperinflation. Moderate thoracic spondylosis. Midline trachea. Normal heart size. Atherosclerosis in the transverse aorta. No pleural effusion or pneumothorax. Clear lungs. IMPRESSION: 1. Hyperinflation, without acute disease. 2.  Aortic Atherosclerosis (ICD10-I70.0). 3. The right lower lobe diminutive pulmonary nodules described on prior CT would not be plain film evident. Electronically Signed    By: Abigail Miyamoto M.D.   On: 05/16/2017 16:32    Assessment & Plan:   Lee Blevins was seen today for annual exam, anemia and hypertension.  Diagnoses and all orders for this visit:  Coronary artery disease involving native coronary artery of native heart without angina pectoris- His EKG is negative for ischemia but he does have frequent PVCs.  I recommended that he follow-up with cardiology. -     Cancel: Lipid panel; Future -     Ambulatory referral to Cardiology -     olmesartan (BENICAR) 20 MG tablet; Take 1 tablet (20 mg total) by mouth daily.  Essential hypertension- His blood pressure is not adequately well controlled.  His EKG is negative for LVH or ischemia.  I recommend that he start taking an ARB for this. -     Comprehensive metabolic panel; Future -     TSH; Future -     Urinalysis, Routine w reflex microscopic; Future -     VITAMIN D 25 Hydroxy (Vit-D Deficiency, Fractures); Future -     EKG 12-Lead -     olmesartan (BENICAR) 20 MG tablet; Take 1 tablet (20 mg total) by mouth daily.  BPH associated with nocturia- His PSA is low which is reassuring that he does not have prostate cancer.  He has no symptoms that need to be treated. -     PSA; Future  Hyperlipidemia with target LDL less than 70- He has not achieved his LDL goal but is also not willing to take a statin. 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  I will monitor him for vitamin deficiencies. -     CBC with Differential/Platelet; Future -     Vitamin B12; Future -     Ferritin; Future -     Folate; Future -  Vitamin B1; Future  Osteoporosis without current pathological fracture, unspecified osteoporosis type -     VITAMIN D 25 Hydroxy (Vit-D Deficiency, Fractures); Future -     DG Bone Density; Future  Alcoholic cardiomyopathy ([place])- I think the amount of alcohol that he drinks explains the cardiomyopathy and possibly also cognitive decline.  I have asked him  to follow-up with cardiology about this. -     Ambulatory referral to Cardiology -     olmesartan (BENICAR) 20 MG tablet; Take 1 tablet (20 mg total) by mouth daily.   I am having Lee Blevins start on olmesartan. I am also having him maintain his fluticasone, albuterol, aspirin EC, Alendronate Sodium (FOSAMAX PO), azaTHIOprine, predniSONE, ADVAIR DISKUS, and montelukast.  Meds ordered this encounter  Medications  . olmesartan (BENICAR) 20 MG tablet    Sig: Take 1 tablet (20 mg total) by mouth daily.    Dispense:  90 tablet    Refill:  1   See AVS for instructions about healthy living and anticipatory guidance.  Follow-up: Return in about 3 months (around 09/19/2018).  Scarlette Calico, MD

## 2018-06-19 NOTE — Patient Instructions (Signed)

## 2018-06-20 ENCOUNTER — Other Ambulatory Visit: Payer: Self-pay | Admitting: Internal Medicine

## 2018-06-20 DIAGNOSIS — I10 Essential (primary) hypertension: Secondary | ICD-10-CM

## 2018-06-20 DIAGNOSIS — I2510 Atherosclerotic heart disease of native coronary artery without angina pectoris: Secondary | ICD-10-CM

## 2018-06-20 DIAGNOSIS — I426 Alcoholic cardiomyopathy: Secondary | ICD-10-CM

## 2018-06-20 MED ORDER — OLMESARTAN MEDOXOMIL 20 MG PO TABS: 20.0000 mg | ORAL_TABLET | Freq: Every day | ORAL | 1 refills | Status: DC

## 2018-06-20 NOTE — Assessment & Plan Note (Signed)

## 2018-06-20 NOTE — Telephone Encounter (Signed)
Resending to different pharmacy per pt request Requested Prescriptions  Pending Prescriptions Disp Refills  . olmesartan (BENICAR) 20 MG tablet 90 tablet 1    Sig: Take 1 tablet (20 mg total) by mouth daily.     Cardiovascular:  Angiotensin Receptor Blockers Failed - 06/20/2018 11:04 AM      Failed - Last BP in normal range    BP Readings from Last 1 Encounters:  06/19/18 (!) 144/90         Passed - Cr in normal range and within 180 days    Creatinine, Ser  Date Value Ref Range Status  06/19/2018 0.76 0.40 - 1.50 mg/dL Final         Passed - K in normal range and within 180 days    Potassium  Date Value Ref Range Status  06/19/2018 3.6 3.5 - 5.1 mEq/L Final         Passed - Patient is not pregnant      Passed - Valid encounter within last 6 months    Recent Outpatient Visits          Yesterday Coronary artery disease involving native coronary artery of native heart without angina pectoris   [place], Thomas L, MD   2 years ago Steroid-induced osteoporosis   [place], Thomas L, MD   2 years ago Myalgia and myositis   [place], Thomas L, MD   2 years ago Abnormal weight loss   [place], Charlene Brooke, NP   2 years ago Other cirrhosis of liver [facility])   [place], MD      Future Appointments            In 3 months [name] Arvid Right, MD [place], [place]         \

## 2018-06-20 NOTE — Telephone Encounter (Signed)
Copied from [place] [id number]. Topic: Quick Communication - Rx Refill/Question >> Jun 20, 2018 10:49 AM Scherrie Gerlach wrote: Medication: olmesartan (BENICAR) 20 MG tablet 90 day with 1 refill Pt does not use mail order, and this was sent to [facility] yesterday. Please resend to  [id number] - [place], [place] - [address]. AT [address] [place] [phone] (Phone) [phone] (Fax)  Pt is out and needs asap

## 2018-06-23 ENCOUNTER — Encounter: Payer: Self-pay | Admitting: Internal Medicine

## 2018-06-23 LAB — VITAMIN B1: Vitamin B1 (Thiamine): 11 nmol/L (ref 8–30)

## 2018-06-25 IMAGING — DX DG CHEST 2V
2 series · 2 of 2 positions shown · non-contrast
Comparison: PA and lateral chest x-ray October 25, 2015

CLINICAL DATA: Follow-up of pneumonia ; history of asthma and
hypertension

EXAM:
CHEST  2 VIEW

[chest pa]
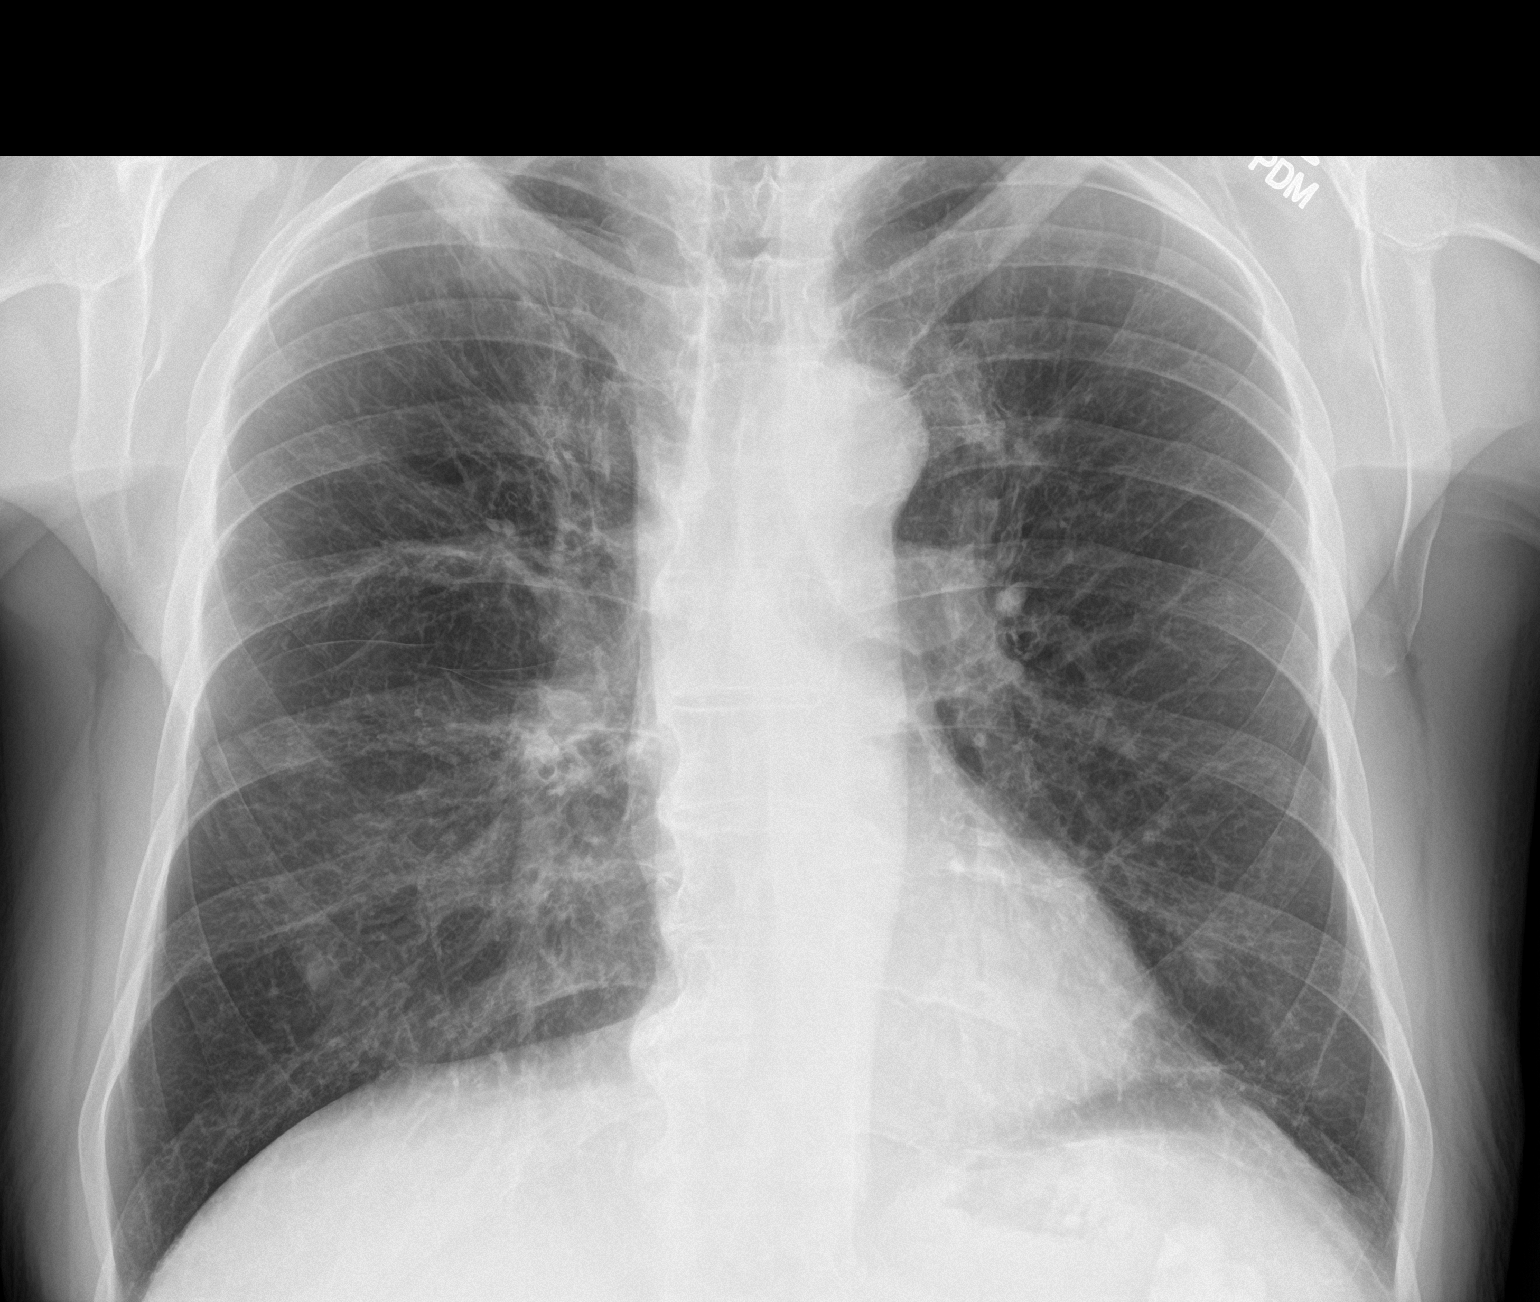

[chest lat]
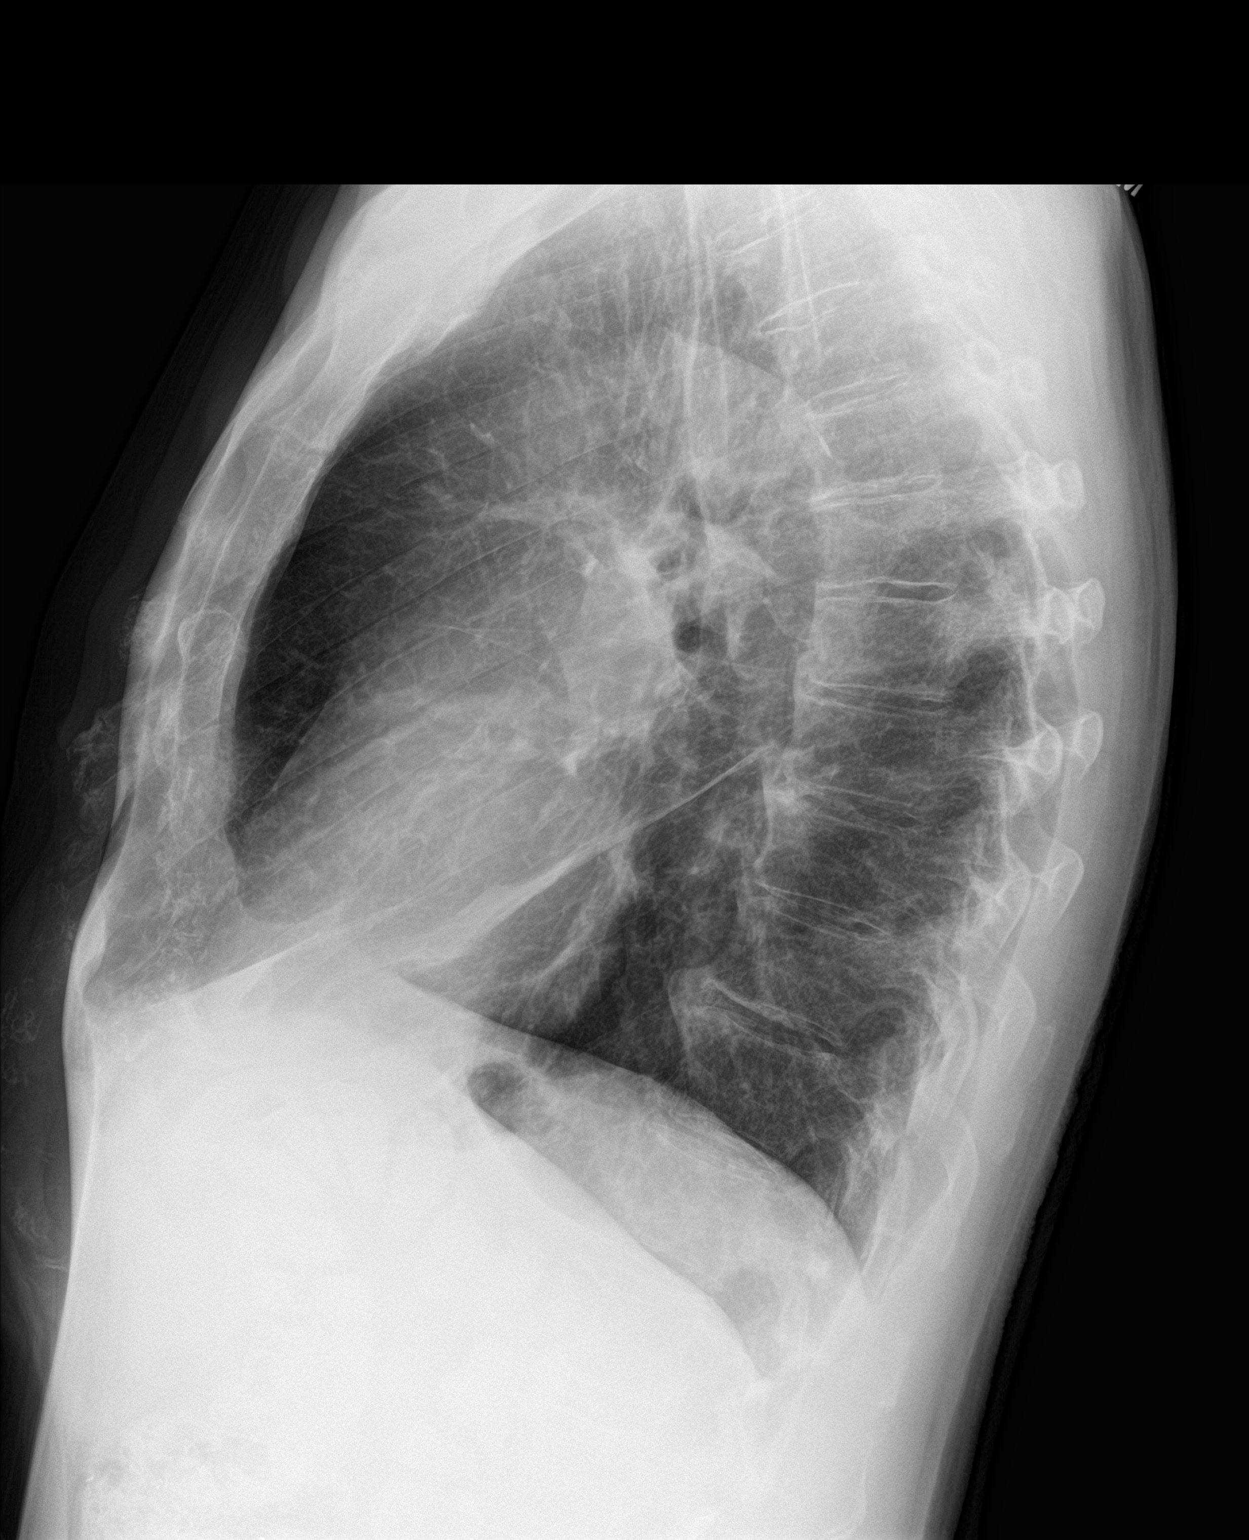

[2 of 2 positions shown; findings below may reference images not displayed]

FINDINGS: The lungs are mildly hyperinflated. The interstitial markings are
coarse. Patchy alveolar infiltrate previously demonstrated in the
right lower lung is considerably less conspicuous. Subtle increased
density remains however. Linear density in the right mid lung is
slightly more conspicuous. Nipple shadows are visible bilaterally
The heart and pulmonary vascularity are normal. The mediastinum is
normal in width. There is faint calcification in the wall of the
thoracic aorta. There is mild multilevel degenerative disc disease
of the thoracic spine.
IMPRESSION: Reactive airway disease. Ongoing improvement in patchy right lower
lobe pneumonia. Slight interval increase conspicuity of right
perihilar linear density in the mid lung likely reflects
atelectasis.

An additional follow-up chest x-ray in 2-3 weeks is recommended
assuming the patient continues to improve clinically.

Aortic atherosclerosis.

## 2018-07-16 ENCOUNTER — Telehealth: Payer: Self-pay | Admitting: Cardiovascular Disease

## 2018-07-16 NOTE — Telephone Encounter (Signed)
New message    Patient's wife is wanting to know if this is a required visit or if this visit can be changed to another date or changed to a virtual visit? Please advise.

## 2018-07-16 NOTE — Telephone Encounter (Signed)
Returned call to patient's wife she wanted to change husband's 07/30/18 appointment with Dr.Berry to a video visit.Advised I will send message to Dr.Berry's RN.

## 2018-07-18 NOTE — Telephone Encounter (Signed)
SPOKE WITH PT WIFE. VIRTUAL VISIT SET FOR 1000 ON 4/14.

## 2018-07-26 ENCOUNTER — Telehealth: Payer: Self-pay | Admitting: *Deleted

## 2018-07-26 ENCOUNTER — Encounter: Payer: Self-pay | Admitting: *Deleted

## 2018-07-26 NOTE — Telephone Encounter (Signed)
   Cardiac Questionnaire:    Since your last visit or hospitalization:    1. Have you been having new or worsening chest pain? NO   2. Have you been having new or worsening shortness of breath? NO 3. Have you been having new or worsening leg swelling, wt gain, or increase in abdominal girth (pants fitting more tightly)? NO   4. Have you had any passing out spells? NO    *A YES to any of these questions would result in the appointment being kept. *If all the answers to these questions are NO, we should indicate that given the current situation regarding the worldwide coronarvirus pandemic, at the recommendation of the CDC, we are looking to limit gatherings in our waiting area, and thus will reschedule their appointment beyond four weeks from today.   _____________   OMVEH-20 Pre-Screening Questions:  . Do you currently have a fever? NO . Have you recently travelled on a cruise, internationally, or to [place], [place], [place], [place], [place], or [place], [place] [employer]) ? NO . Have you been in contact with someone that is currently pending confirmation of Covid19 testing or has been confirmed to have the [place] virus? NO . Are you currently experiencing fatigue or cough? NO    Spoke with patient and we agreed to do his office visit July 30, 2018 via telephone. We went over his medications, pharmacy, and history.

## 2018-07-29 IMAGING — DX DG CHEST 2V
2 series · 2 of 2 positions shown · non-contrast
Comparison: 11/10/2015.

CLINICAL DATA: Follow-up pneumonia.

EXAM:
CHEST  2 VIEW

[chest pa]
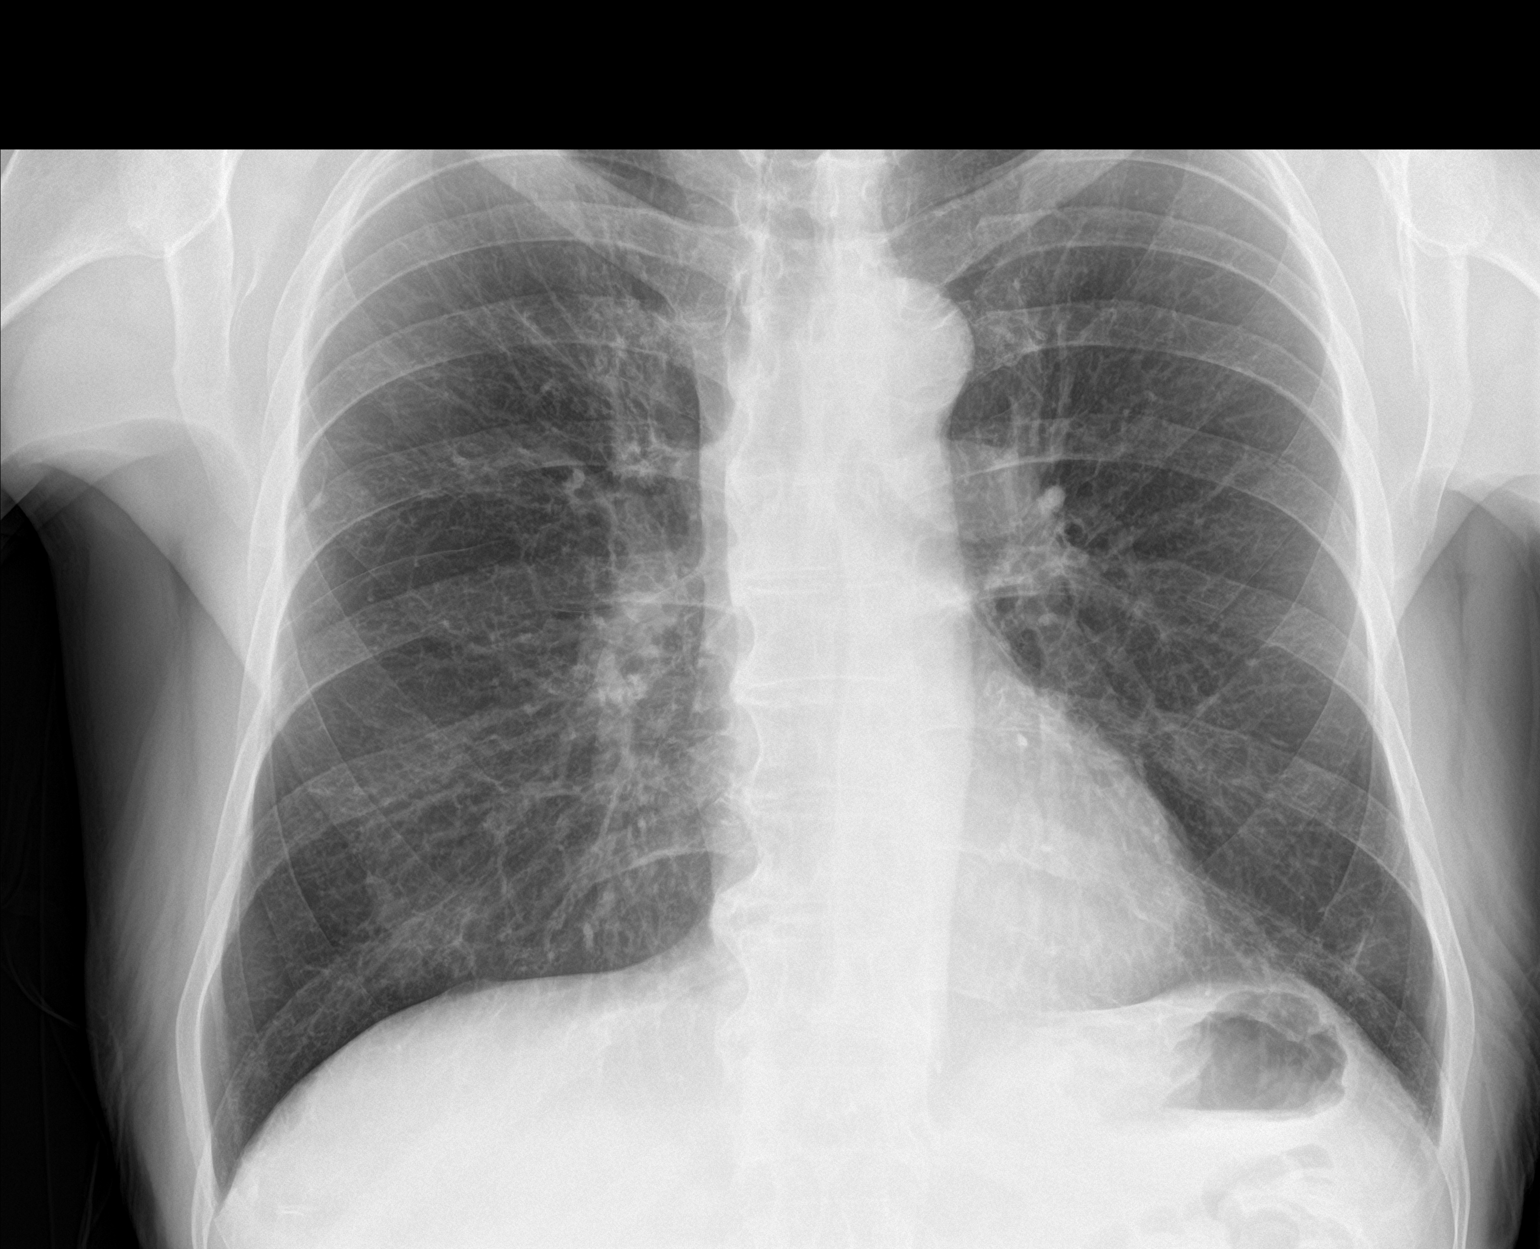

[chest lat]
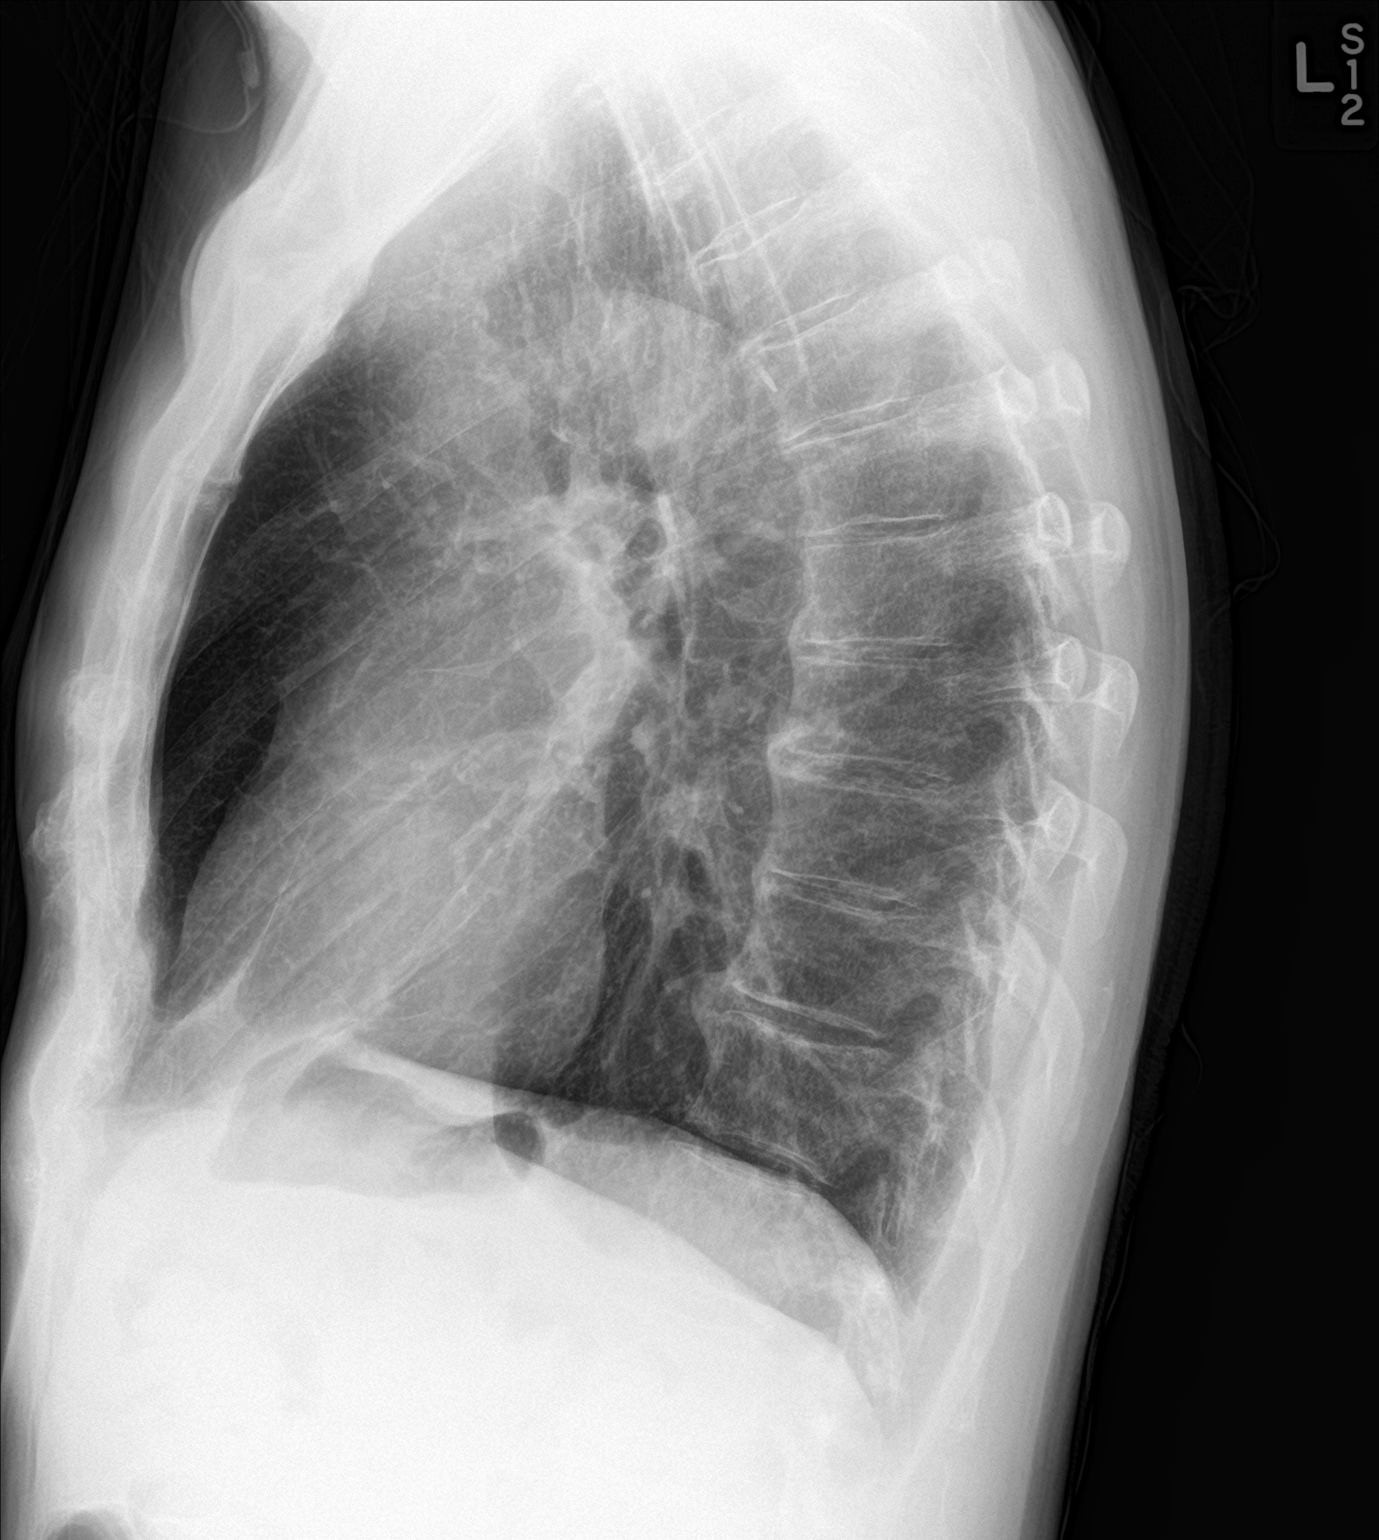

[2 of 2 positions shown; findings below may reference images not displayed]

FINDINGS: Mediastinum and hilar structures are unremarkable. Heart size
normal. Interim clearing of right base infiltrate. Stable nipple
shadow. No pleural effusion or pneumothorax. COPD cannot be
excluded. No acute bony abnormality.
IMPRESSION: Interim clearing of right base infiltrate.

## 2018-07-30 ENCOUNTER — Telehealth (INDEPENDENT_AMBULATORY_CARE_PROVIDER_SITE_OTHER): Payer: Medicare Other | Admitting: Cardiovascular Disease

## 2018-07-30 ENCOUNTER — Telehealth: Payer: Self-pay

## 2018-07-30 DIAGNOSIS — I2510 Atherosclerotic heart disease of native coronary artery without angina pectoris: Secondary | ICD-10-CM | POA: Diagnosis not present

## 2018-07-30 DIAGNOSIS — I10 Essential (primary) hypertension: Secondary | ICD-10-CM | POA: Diagnosis not present

## 2018-07-30 DIAGNOSIS — R9431 Abnormal electrocardiogram [ECG] [EKG]: Secondary | ICD-10-CM | POA: Diagnosis not present

## 2018-07-30 DIAGNOSIS — E785 Hyperlipidemia, unspecified: Secondary | ICD-10-CM

## 2018-07-30 NOTE — Patient Instructions (Signed)

## 2018-07-30 NOTE — Telephone Encounter (Signed)
AVS released to MyChart.  

## 2018-07-30 NOTE — Progress Notes (Signed)
Virtual Visit via Telephone Note   This visit type was conducted due to national recommendations for restrictions regarding the COVID-19 Pandemic (e.g. social distancing) in an effort to limit this patient's exposure and mitigate transmission in our community.  Due to his co-morbid illnesses, this patient is at least at moderate risk for complications without adequate follow up.  This format is felt to be most appropriate for this patient at this time.  The patient did not have access to video technology/had technical difficulties with video requiring transitioning to audio format only (telephone).  All issues noted in this document were discussed and addressed.  No physical exam could be performed with this format.  Please refer to the patient's chart for his  consent to telehealth for [facility].   Evaluation Performed:  Follow-up visit  Date:  07/30/2018   ID:  [name], DOB 04-11-1946, MRN [id number]  Patient Location: Home  Provider Location: Home  PCP:  Janith Lima, MD  Cardiologist: Dr. Quay Burow Electrophysiologist:  None   Chief Complaint: Abnormal EKG  History of Present Illness:    Hyatt Capobianco is a 73 y.o. male who presents via audio/video conferencing for a telehealth visit today.    Mr. Mones is a very pleasant 73 year old married Caucasian male father of 1 child who I am seeing at the request of Dr. Scarlette Calico for an abnormal EKG.  This is a telemedicine phone visit.  His wife [name] is present as well.  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  He has seen Dr. Gerald Stabs End 01/21/2016 for an abnormal EKG with inferior Q waves.  This was a preoperative evaluation.  Because of this he had a Myoview stress test performed 12/22/2015 that showed inferior scar versus diaphragmatic attenuation and a 2D echo performed 02/03/2016 that showed an ejection fraction of 55 to 60% without focal wall motion abnormalities.  Interestingly, he did have  a chest CT performed 11/17/2016 that showed coronary calcification.  His cardiac risk factor profile is notable for hyperlipidemia and hypertension.  He is not diabetic nor does he smoke.  His past medical history otherwise is notable for a stroke back in 2011 which he has had no neurologic deficits from.  He saw Dr. [name] in the office a month ago who did an EKG on 06/19/2018 that did show PVCs with inferior Q waves.  He is completely asymptomatic.  His most recent lipid profile performed 06/19/2018 revealed a total cholesterol of 266, LDL 132 and HDL of 124.  He has been on a statin drug in the past but currently is not on 1.  The patient does not have symptoms concerning for COVID-19 infection (fever, chills, cough, or new shortness of breath).    Past Medical History:  Diagnosis Date  . Asthma   . Chronic sinusitis   . History of CVA (cerebrovascular accident) 2011   hemorrhagic  . HTN (hypertension)   . Hyperlipidemia   . Nasal polyps   . Pneumonia    Past Surgical History:  Procedure Laterality Date  . NASAL POLYP EXCISION     x2  . TONSILLECTOMY       No outpatient medications have been marked as taking for the 07/30/18 encounter (Telemedicine) with Lorretta Harp, MD.     Allergies:   Amlodipine; Crestor [rosuvastatin]; Livalo [pitavastatin]; and Bystolic [nebivolol hcl]   Social History   Tobacco Use  . Smoking status: Former Smoker  Packs/day: 1.00    Years: 1.00    Pack years: 1.00    Types: Cigarettes    Last attempt to quit: 04/17/1973    Years since quitting: 45.3  . Smokeless tobacco: Never Used  . Tobacco comment: 1975  Substance Use Topics  . Alcohol use: Yes    Alcohol/week: 35.0 standard drinks    Types: 35 Glasses of wine per week  . Drug use: No     Family Hx: The patient's family history includes Heart disease in his father; Neurologic Disorder (age of onset: 74) in his mother; Valvular heart disease in his father. There is no history of Cancer,  Early death, Hearing loss, Hyperlipidemia, Hypertension, Stroke, or Kidney disease.  ROS:   Please see the history of present illness.     All other systems reviewed and are negative.   Prior CV studies:   The following studies were reviewed today:  None  Labs/Other Tests and Data Reviewed:    EKG:  An ECG dated 06/19/2018 was personally reviewed today and demonstrated:  Normal sinus rhythm with occasional PVCs and inferior Q waves  Recent Labs: 06/19/2018: ALT 9; BUN 16; Creatinine, Ser 0.76; Hemoglobin 14.5; Platelets 185.0; Potassium 3.6; Sodium 139; TSH 1.62   Recent Lipid Panel Lab Results  Component Value Date/Time   CHOL 266 (H) 06/19/2018 12:20 PM   TRIG 52.0 06/19/2018 12:20 PM   HDL 124.40 06/19/2018 12:20 PM   CHOLHDL 2 06/19/2018 12:20 PM   LDLCALC 132 (H) 06/19/2018 12:20 PM   LDLDIRECT 160.0 06/13/2013 02:14 PM    Wt Readings from Last 3 Encounters:  07/30/18 161 lb (73 kg)  06/19/18 166 lb 12 oz (75.6 kg)  02/11/18 162 lb (73.5 kg)     Objective:    Vital Signs:  BP 129/80   Pulse 78   Temp (!) 96.5 F (35.8 C)   Wt 161 lb (73 kg)   BMI 21.24 kg/m    This was a telemedicine phone visit and therefore a physical exam was not performed  ASSESSMENT & PLAN:    1. Abnormal EKG- Mr. Monks has inferior Q waves and occasional PVCs on EKG performed by his PCP 06/19/2018.  He is totally asymptomatic.  He was evaluated for this by Dr. Saunders Revel 01/21/2016 with a Myoview stress test that showed inferior scar with normal wall motion thought to be diaphragmatic attenuation and a 2D echo performed 02/03/2016 that showed an EF of 55 to 60% without focal wall motion abnormalities.  I have reassured Mr. Cuaresma that his EKG shows nothing I am concerned about at this time.  He is not aware of the PVCs. 2. Essential hypertension- blood pressure measured today by the patient at home was 129/80 with a pulse of 78.  He is on Benicar 3. Hyperlipidemia- his last lipid profile performed  06/19/2018 by his PCP revealed total cluster 266, LDL 132 and HDL 124.  He is currently not on a statin drug.  Given his high HDL I am not inclined to start him on a statin drug at this time.  COVID-19 Education: The signs and symptoms of COVID-19 were discussed with the patient and how to seek care for testing (follow up with PCP or arrange E-visit).  The importance of social distancing was discussed today.  Time:   Today, I have spent 16 minutes with the patient with telehealth technology discussing the above problems.     Medication Adjustments/Labs and Tests Ordered: Current medicines are reviewed at length  with the patient today.  Concerns regarding medicines are outlined above.   Tests Ordered: No orders of the defined types were placed in this encounter.   Medication Changes: No orders of the defined types were placed in this encounter.   Disposition:  Follow up in 1 year(s)  Signed, Quay Burow, MD  07/30/2018 10:08 AM    [place]

## 2018-07-30 NOTE — Telephone Encounter (Signed)
Virtual Visit Pre-Appointment Phone Call  Steps For Call:  1. Confirm consent - "In the setting of the current Covid19 crisis, you are scheduled for a (phone or video) visit with your provider on (date) at (time).  Just as we do with many in-office visits, in order for you to participate in this visit, we must obtain consent.  If you'd like, I can send this to your mychart (if signed up) or email for you to review.  Otherwise, I can obtain your verbal consent now.  All virtual visits are billed to your insurance company just like a normal visit would be.  By agreeing to a virtual visit, we'd like you to understand that the technology does not allow for your provider to perform an examination, and thus may limit your provider's ability to fully assess your condition.  Finally, though the technology is pretty good, we cannot assure that it will always work on either your or our end, and in the setting of a video visit, we may have to convert it to a phone-only visit.  In either situation, we cannot ensure that we have a secure connection.  Are you willing to proceed?" STAFF: Did the patient verbally acknowledge consent to telehealth visit? YES 1.Confirm the BEST phone number to call the day of the visit:   2. Give patient instructions for WebEx/MyChart download to smartphone as below or Doximity/Doxy.me if video visit (depending on what platform provider is using)  3. Advise patient to be prepared with any vital sign or heart rhythm information, their current medicines, and a piece of paper and pen handy for any instructions they may receive the day of their visit  4. Inform patient they will receive a phone call 15 minutes prior to their appointment time (may be from unknown caller ID) so they should be prepared to answer  5. Confirm that appointment type is correct in Epic appointment notes (video vs telephone)     TELEPHONE CALL NOTE  Lee Blevins has been deemed a candidate for a  follow-up tele-health visit to limit community exposure during the Covid-19 pandemic. I spoke with the patient via phone to ensure availability of phone/video source, confirm preferred email & phone number, and discuss instructions and expectations.  I reminded Lee Blevins to be prepared with any vital sign and/or heart rhythm information that could potentially be obtained via home monitoring, at the time of his visit. I reminded Lee Blevins to expect a phone call at the time of his visit if his visit.  Leora Platt Dawna Part, RN 07/30/2018    DOWNLOADING THE WEBEX APP TO SMARTPHONE  - If Apple, ask patient to go to App Store and type in WebEx in the search bar. [place] [employer], the blue/green circle. If Android, go to [employer] and type in [employer] in the search bar. The app is free but as with any other app downloads, their phone may require them to verify saved payment information or Apple/Android password.  - The patient does NOT have to create an account. - On the day of the visit, the assist will walk the patient through joining the meeting with the meeting number/password.  DOWNLOADING THE MYCHART APP TO SMARTPHONE  - If Apple, go to [employer] and type in MyChart in the search bar and download the app. If Android, ask patient to go to [employer] and type in [place] in the search bar and download the app. The app is free  but as with any other app downloads, their phone may require them to verify saved payment information or Apple/Android password.  - The patient will need to then log into the app with their MyChart username and password, and select [facility] as their healthcare provider to link the account. When it is time for your visit, go to the MyChart app, find appointments, and click Begin Video Visit. Be sure to Select Allow for your device to access the Microphone and Camera for your visit. You will then be connected, and your provider will be with you  shortly.  **If they have any issues connecting, or need assistance please contact [facility] [phone])**  **If using a computer, in order to ensure the best quality for your visit they will need to use either of the following Internet Browsers: Microsoft [place], or Google Chrome**  [place]   I hereby voluntarily request, consent and authorize [facility] and its employed or contracted physicians, physician assistants, nurse practitioners or other licensed health care professionals (the Practitioner), to provide me with telemedicine health care services (the Services") as deemed necessary by the treating Practitioner. I acknowledge and consent to receive the Services by the Practitioner via telemedicine. I understand that the telemedicine visit will involve communicating with the Practitioner through live audiovisual communication technology and the disclosure of certain medical information by electronic transmission. I acknowledge that I have been given the opportunity to request an in-person assessment or other available alternative prior to the telemedicine visit and am voluntarily participating in the telemedicine visit.  I understand that I have the right to withhold or withdraw my consent to the use of telemedicine in the course of my care at any time, without affecting my right to future care or treatment, and that the Practitioner or I may terminate the telemedicine visit at any time. I understand that I have the right to inspect all information obtained and/or recorded in the course of the telemedicine visit and may receive copies of available information for a reasonable fee.  I understand that some of the potential risks of receiving the Services via telemedicine include:   Delay or interruption in medical evaluation due to technological equipment failure or disruption;  Information transmitted may not be sufficient (e.g. poor  resolution of images) to allow for appropriate medical decision making by the Practitioner; and/or   In rare instances, security protocols could fail, causing a breach of personal health information.  Furthermore, I acknowledge that it is my responsibility to provide information about my medical history, conditions and care that is complete and accurate to the best of my ability. I acknowledge that Practitioner's advice, recommendations, and/or decision may be based on factors not within their control, such as incomplete or inaccurate data provided by me or distortions of diagnostic images or specimens that may result from electronic transmissions. I understand that the practice of medicine is not an exact science and that Practitioner makes no warranties or guarantees regarding treatment outcomes. I acknowledge that I will receive a copy of this consent concurrently upon execution via email to the email address I last provided but may also request a printed copy by calling the office of [facility].    I understand that my insurance will be billed for this visit.   I have read or had this consent read to me.  I understand the contents of this consent, which adequately explains the benefits and risks of the Services being  provided via telemedicine.   I have been provided ample opportunity to ask questions regarding this consent and the Services and have had my questions answered to my satisfaction.  I give my informed consent for the services to be provided through the use of telemedicine in my medical care  By participating in this telemedicine visit I agree to the above.

## 2018-08-06 DIAGNOSIS — M150 Primary generalized (osteo)arthritis: Secondary | ICD-10-CM | POA: Diagnosis not present

## 2018-08-06 DIAGNOSIS — M301 Polyarteritis with lung involvement [Churg-Strauss]: Secondary | ICD-10-CM | POA: Diagnosis not present

## 2018-08-23 ENCOUNTER — Other Ambulatory Visit: Payer: Medicare Other

## 2018-08-28 ENCOUNTER — Other Ambulatory Visit: Payer: Self-pay | Admitting: Internal Medicine

## 2018-09-19 ENCOUNTER — Ambulatory Visit: Payer: Medicare Other | Admitting: Internal Medicine

## 2018-10-16 ENCOUNTER — Other Ambulatory Visit: Payer: Self-pay

## 2018-10-16 ENCOUNTER — Ambulatory Visit
Admission: RE | Admit: 2018-10-16 | Discharge: 2018-10-16 | Disposition: A | Discharge status: Home / Self Care | Payer: Medicare Other | Source: Ambulatory Visit | Attending: Internal Medicine | Admitting: Internal Medicine

## 2018-10-16 DIAGNOSIS — M810 Age-related osteoporosis without current pathological fracture: Secondary | ICD-10-CM

## 2018-10-16 DIAGNOSIS — M85852 Other specified disorders of bone density and structure, left thigh: Secondary | ICD-10-CM | POA: Diagnosis not present

## 2018-10-17 ENCOUNTER — Encounter: Payer: Self-pay | Admitting: Internal Medicine

## 2018-11-05 DIAGNOSIS — M810 Age-related osteoporosis without current pathological fracture: Secondary | ICD-10-CM | POA: Diagnosis not present

## 2018-11-05 DIAGNOSIS — Z6822 Body mass index (BMI) 22.0-22.9, adult: Secondary | ICD-10-CM | POA: Diagnosis not present

## 2018-11-05 DIAGNOSIS — M150 Primary generalized (osteo)arthritis: Secondary | ICD-10-CM | POA: Diagnosis not present

## 2018-11-05 DIAGNOSIS — M301 Polyarteritis with lung involvement [Churg-Strauss]: Secondary | ICD-10-CM | POA: Diagnosis not present

## 2018-11-05 DIAGNOSIS — D72818 Other decreased white blood cell count: Secondary | ICD-10-CM | POA: Diagnosis not present

## 2018-11-22 ENCOUNTER — Other Ambulatory Visit: Payer: Self-pay | Admitting: Internal Medicine

## 2018-12-02 ENCOUNTER — Other Ambulatory Visit: Payer: Self-pay | Admitting: Internal Medicine

## 2018-12-09 ENCOUNTER — Ambulatory Visit (INDEPENDENT_AMBULATORY_CARE_PROVIDER_SITE_OTHER)
Admission: RE | Admit: 2018-12-09 | Discharge: 2018-12-09 | Disposition: A | Discharge status: Home / Self Care | Payer: Medicare Other | Source: Ambulatory Visit | Attending: Internal Medicine | Admitting: Internal Medicine

## 2018-12-09 ENCOUNTER — Other Ambulatory Visit: Payer: Self-pay

## 2018-12-09 ENCOUNTER — Encounter: Payer: Self-pay | Admitting: Internal Medicine

## 2018-12-09 ENCOUNTER — Ambulatory Visit (INDEPENDENT_AMBULATORY_CARE_PROVIDER_SITE_OTHER): Payer: Medicare Other | Admitting: Internal Medicine

## 2018-12-09 VITALS — BP 142/82 | HR 89 | Temp 98.3°F | Resp 16 | Ht 73.0 in | Wt 161.0 lb

## 2018-12-09 DIAGNOSIS — S6722XA Crushing injury of left hand, initial encounter: Secondary | ICD-10-CM

## 2018-12-09 DIAGNOSIS — I2510 Atherosclerotic heart disease of native coronary artery without angina pectoris: Secondary | ICD-10-CM

## 2018-12-09 DIAGNOSIS — L03114 Cellulitis of left upper limb: Secondary | ICD-10-CM

## 2018-12-09 DIAGNOSIS — Z23 Encounter for immunization: Secondary | ICD-10-CM | POA: Diagnosis not present

## 2018-12-09 MED ORDER — AMOXICILLIN-POT CLAVULANATE 875-125 MG PO TABS: 1.0000 | ORAL_TABLET | Freq: Two times a day (BID) | ORAL | 0 refills | Status: AC

## 2018-12-09 NOTE — Patient Instructions (Signed)

## 2018-12-09 NOTE — Progress Notes (Signed)
Subjective:  Patient ID: Lee Blevins, male    DOB: 1946/02/29  Age: 73 y.o. MRN: [id number]  CC: Hand Injury   HPI Lee Blevins presents for concerns about his left hand.  He was doing some chiseling about 4 days ago when he hit the dorsolateral surface of his left hand causing an injury.  The injured area has healed with a scab but now he has developed pain, redness, and swelling on the dorsum of the hand.  Outpatient Medications Prior to Visit  Medication Sig Dispense Refill  . ADVAIR DISKUS 250-50 MCG/DOSE AEPB INHALE 1 INHALATION TWICE DAILY *RINSE MOUTH AFTERWARDS* 180 each 1  . albuterol (PROVENTIL HFA;VENTOLIN HFA) 108 (90 Base) MCG/ACT inhaler Inhale 2 puffs into the lungs every 6 (six) hours as needed for wheezing or shortness of breath. 1 Inhaler 11  . Alendronate Sodium (FOSAMAX PO) Take by mouth once a week.    . montelukast (SINGULAIR) 10 MG tablet TAKE 1 TABLET BY MOUTH EVERYDAY AT BEDTIME 90 tablet 0  . olmesartan (BENICAR) 20 MG tablet Take 1 tablet (20 mg total) by mouth daily. 90 tablet 1  . predniSONE (DELTASONE) 5 MG tablet Take 5 mg by mouth daily with breakfast.     No facility-administered medications prior to visit.     ROS Review of Systems  Constitutional: Negative for chills, fatigue and fever.  HENT: Negative.   Eyes: Negative.   Respiratory: Negative for cough, shortness of breath and wheezing.   Gastrointestinal: Negative for abdominal pain, constipation, diarrhea, nausea and vomiting.  Endocrine: Negative.   Genitourinary: Negative.   Musculoskeletal: Positive for arthralgias. Negative for myalgias and neck pain.  Skin: Positive for color change and wound. Negative for rash.  Neurological: Negative.  Negative for dizziness and light-headedness.  Hematological: Negative for adenopathy. Does not bruise/bleed easily.  Psychiatric/Behavioral: Negative.     Objective:  BP (!) 142/82 (BP Location: Left Arm, Patient Position: Sitting, Cuff Size:  Normal)   Pulse 89   Temp 98.3 F (36.8 C) (Oral)   Resp 16   Ht 6\' 1"  (1.854 m)   Wt 161 lb (73 kg)   SpO2 95%   BMI 21.24 kg/m   BP Readings from Last 3 Encounters:  12/09/18 (!) 142/82  07/30/18 129/80  06/19/18 (!) 144/90    Wt Readings from Last 3 Encounters:  12/09/18 161 lb (73 kg)  07/30/18 161 lb (73 kg)  06/19/18 166 lb 12 oz (75.6 kg)    Physical Exam Constitutional:      General: He is not in acute distress.    Appearance: He is not toxic-appearing.  HENT:     Nose: Nose normal.  Eyes:     General: No scleral icterus. Neck:     Musculoskeletal: Normal range of motion. No neck rigidity.  Cardiovascular:     Rate and Rhythm: Normal rate and regular rhythm.  Pulmonary:     Effort: Pulmonary effort is normal.     Breath sounds: No stridor. No wheezing, rhonchi or rales.  Abdominal:     General: Abdomen is flat.     Palpations: There is no mass.     Tenderness: There is no abdominal tenderness.  Musculoskeletal:        General: Swelling present.       Hands:  Lymphadenopathy:     Cervical: No cervical adenopathy.  Skin:    Findings: Erythema present.  Neurological:     Mental Status: He is alert.  Lab Results  Component Value Date   WBC 3.4 (L) 06/19/2018   HGB 14.5 06/19/2018   HCT 42.9 06/19/2018   PLT 185.0 06/19/2018   GLUCOSE 117 (H) 06/19/2018   CHOL 266 (H) 06/19/2018   TRIG 52.0 06/19/2018   HDL 124.40 06/19/2018   LDLDIRECT 160.0 06/13/2013   LDLCALC 132 (H) 06/19/2018   ALT 9 06/19/2018   AST 17 06/19/2018   NA 139 06/19/2018   K 3.6 06/19/2018   CL 103 06/19/2018   CREATININE 0.76 06/19/2018   BUN 16 06/19/2018   CO2 29 06/19/2018   TSH 1.62 06/19/2018   PSA 0.63 06/19/2018   INR 1.1 (H) 12/14/2015   HGBA1C 5.5 06/08/2015    Dg Bone Density  Result Date: 10/16/2018 EXAM: DUAL X-RAY ABSORPTIOMETRY (DXA) FOR BONE MINERAL DENSITY IMPRESSION: Referring Physician:  Janith Lima Your patient completed a BMD test using  Lunar IDXA DXA system ( analysis version: 16 ) manufactured by [employer]. Technologist: Renford Dills PATIENT: Name: Lee Blevins, Lee Blevins Patient ID: [id number] Birth Date: Apr 02, 1946 Height: 72.0 in. Sex: Male Measured: 10/16/2018 Weight: 164.4 lbs. Indications: Advanced Age, Caucasian, Glucocorticoids (Chronic) (255.41), History of Osteoporosis, long term current use of systemic steriods, Low Calcium Intake (269.3) Fractures: None Treatments: Fosamax ASSESSMENT: The BMD measured at Femur Neck Left is 0.815 g/cm2 with a T-score of -1.6. This patient is considered osteopenic according to [facility]) criteria. The scan quality is limited by patient body habitus. L-4 was excluded due to degenerative changes. Patient does not meet criteria for FRAX due to current treatment with Fosamax. Site Region Measured Date Measured Age YA BMD Significant CHANGE T-score DualFemur Neck Left  10/16/2018    74.6         -1.6    0.815 g/cm2 AP Spine  L1-L3      10/16/2018    74.8         0.3     1.207 g/cm2 DualFemur Total Mean 10/16/2018    74.8         -0.7    0.922 g/cm2 World Health Organization [facility]) criteria for post-menopausal, Caucasian Women: Normal       T-score at or above -1 SD Osteopenia   T-score between -1 and -2.5 SD Osteoporosis T-score at or below -2.5 SD RECOMMENDATION: 1. All patients should optimize calcium and vitamin D intake. 2. Consider FDA approved medical therapies in postmenopausal women and men aged 26 years and older, based on the following: a. A hip or vertebral (clinical or morphometric) fracture b. T- score < or = -2.5 at the femoral neck or spine after appropriate evaluation to exclude secondary causes c. Low bone mass (T-score between -1.0 and -2.5 at the femoral neck or spine) and a 10 year probability of a hip fracture > or = 3% or a 10 year probability of a major osteoporosis-related fracture > or = 20% based on the [place]-adapted WHO algorithm d. Clinician judgment and/or patient preferences  may indicate treatment for people with 10-year fracture probabilities above or below these levels FOLLOW-UP: Patients with diagnosis of osteoporosis or at high risk for fracture should have regular bone mineral density tests. For patients eligible for Medicare, routine testing is allowed once every 2 years. The testing frequency can be increased to one year for patients who have rapidly progressing disease, those who are receiving or discontinuing medical therapy to restore bone mass, or have additional risk factors. Electronically Signed   By: Myles Rosenthal.D.  On: 10/16/2018 14:34   Dg Hand Complete Left  Result Date: 12/09/2018 CLINICAL DATA:  73 year old male with crush injury of the left hand EXAM: LEFT HAND - COMPLETE 3+ VIEW COMPARISON:  None. FINDINGS: No acute displaced fracture. Degenerative changes of the interphalangeal joints. No subluxation/dislocation. No erosive changes. No osteopenia. Degenerative changes at the first carpometacarpal joint. Vascular calcifications. No radiopaque foreign body. Soft tissue swelling on the dorsum of the hand. IMPRESSION: Negative for acute bony abnormality. Questionable soft tissue swelling on the dorsum of the hand. No radiopaque foreign body. Osteoarthritis. Vascular calcifications. Electronically Signed   By: Corrie Mckusick D.O.   On: 12/09/2018 15:33     Assessment & Plan:   Jahquez was seen today for hand injury.  Diagnoses and all orders for this visit:  Crushing injury of left hand, initial encounter- Plain films are negative for foreign body, bony injury, or subcutaneous air.  There is evidence of soft tissue swelling.  I will treat him for cellulitis with a 10-day course of Augmentin. -     DG Hand Complete Left; Future  Cellulitis of left hand excluding fingers and thumb -     DG Hand Complete Left; Future -     amoxicillin-clavulanate (AUGMENTIN) 875-125 MG tablet; Take 1 tablet by mouth 2 (two) times daily for 10 days.  Need for  influenza vaccination -     Flu Vaccine QUAD High Dose(Fluad)  Need for Tdap vaccination -     Tdap vaccine greater than or equal to 7yo IM   I have discontinued Shanon Brow Schum's predniSONE. I am also having him start on amoxicillin-clavulanate. Additionally, I am having him maintain his albuterol, Alendronate Sodium (FOSAMAX PO), olmesartan, Advair Diskus, and montelukast.  Meds ordered this encounter  Medications  . amoxicillin-clavulanate (AUGMENTIN) 875-125 MG tablet    Sig: Take 1 tablet by mouth 2 (two) times daily for 10 days.    Dispense:  20 tablet    Refill:  0     Follow-up: Return in about 1 week (around 12/16/2018).  Scarlette Calico, MD

## 2018-12-16 ENCOUNTER — Encounter: Payer: Self-pay | Admitting: Internal Medicine

## 2018-12-16 ENCOUNTER — Other Ambulatory Visit: Payer: Self-pay

## 2018-12-16 ENCOUNTER — Ambulatory Visit (INDEPENDENT_AMBULATORY_CARE_PROVIDER_SITE_OTHER): Payer: Medicare Other | Admitting: Internal Medicine

## 2018-12-16 VITALS — BP 146/84 | HR 72 | Temp 97.7°F | Resp 16 | Ht 73.0 in | Wt 168.0 lb

## 2018-12-16 DIAGNOSIS — I2510 Atherosclerotic heart disease of native coronary artery without angina pectoris: Secondary | ICD-10-CM | POA: Diagnosis not present

## 2018-12-16 DIAGNOSIS — L03114 Cellulitis of left upper limb: Secondary | ICD-10-CM

## 2018-12-16 NOTE — Patient Instructions (Signed)

## 2018-12-16 NOTE — Progress Notes (Signed)
Subjective:  Patient ID: Lee Blevins, male    DOB: 06-12-1945  Age: 73 y.o. MRN: [id number]  CC: Cellulitis   HPI Lee Blevins presents for recheck of cellulitis on the dorsum of his left hand.  He is taking the Augmentin as prescribed and is tolerating it well.  The pain, redness, and swelling on the dorsum of his left hand has resolved.  He feels well today and offers no complaints.  Outpatient Medications Prior to Visit  Medication Sig Dispense Refill  . ADVAIR DISKUS 250-50 MCG/DOSE AEPB INHALE 1 INHALATION TWICE DAILY *RINSE MOUTH AFTERWARDS* 180 each 1  . albuterol (PROVENTIL HFA;VENTOLIN HFA) 108 (90 Base) MCG/ACT inhaler Inhale 2 puffs into the lungs every 6 (six) hours as needed for wheezing or shortness of breath. 1 Inhaler 11  . Alendronate Sodium (FOSAMAX PO) Take by mouth once a week.    Marland Kitchen amoxicillin-clavulanate (AUGMENTIN) 875-125 MG tablet Take 1 tablet by mouth 2 (two) times daily for 10 days. 20 tablet 0  . montelukast (SINGULAIR) 10 MG tablet TAKE 1 TABLET BY MOUTH EVERYDAY AT BEDTIME 90 tablet 0  . olmesartan (BENICAR) 20 MG tablet Take 1 tablet (20 mg total) by mouth daily. 90 tablet 1   No facility-administered medications prior to visit.     ROS Review of Systems  Constitutional: Negative for chills and fever.  HENT: Negative.   Eyes: Negative for visual disturbance.  Respiratory: Negative for cough, chest tightness, shortness of breath and wheezing.   Cardiovascular: Negative for chest pain, palpitations and leg swelling.  Gastrointestinal: Negative for abdominal pain, diarrhea, nausea and vomiting.  Endocrine: Negative.   Genitourinary: Negative.  Negative for difficulty urinating.  Musculoskeletal: Negative.  Negative for arthralgias.  Skin: Negative for color change and wound.  Neurological: Negative.   Hematological: Negative for adenopathy. Does not bruise/bleed easily.  Psychiatric/Behavioral: Negative.     Objective:  BP (!) 146/84 (BP  Location: Left Arm, Patient Position: Sitting, Cuff Size: Normal)   Pulse 72   Temp 97.7 F (36.5 C) (Oral)   Resp 16   Ht 6\' 1"  (1.854 m)   Wt 168 lb (76.2 kg)   SpO2 95%   BMI 22.16 kg/m   BP Readings from Last 3 Encounters:  12/16/18 (!) 146/84  12/09/18 (!) 142/82  07/30/18 129/80    Wt Readings from Last 3 Encounters:  12/16/18 168 lb (76.2 kg)  12/09/18 161 lb (73 kg)  07/30/18 161 lb (73 kg)    Physical Exam Vitals signs reviewed.  HENT:     Nose: Nose normal.  Eyes:     General: No scleral icterus.    Conjunctiva/sclera: Conjunctivae normal.  Neck:     Musculoskeletal: Normal range of motion and neck supple.  Cardiovascular:     Rate and Rhythm: Normal rate and regular rhythm.  Pulmonary:     Effort: Pulmonary effort is normal.     Breath sounds: No stridor. No wheezing, rhonchi or rales.  Abdominal:     General: Abdomen is flat.     Palpations: There is no hepatomegaly or splenomegaly.     Tenderness: There is no abdominal tenderness.  Musculoskeletal:     Left hand: He exhibits normal range of motion, no tenderness, no bony tenderness, normal capillary refill, no deformity, no laceration and no swelling. Normal sensation noted. Normal strength noted.     Comments: Left hand examination is normal today     Lab Results  Component Value Date   WBC 3.4 (  L) 06/19/2018   HGB 14.5 06/19/2018   HCT 42.9 06/19/2018   PLT 185.0 06/19/2018   GLUCOSE 117 (H) 06/19/2018   CHOL 266 (H) 06/19/2018   TRIG 52.0 06/19/2018   HDL 124.40 06/19/2018   LDLDIRECT 160.0 06/13/2013   LDLCALC 132 (H) 06/19/2018   ALT 9 06/19/2018   AST 17 06/19/2018   NA 139 06/19/2018   K 3.6 06/19/2018   CL 103 06/19/2018   CREATININE 0.76 06/19/2018   BUN 16 06/19/2018   CO2 29 06/19/2018   TSH 1.62 06/19/2018   PSA 0.63 06/19/2018   INR 1.1 (H) 12/14/2015   HGBA1C 5.5 06/08/2015    Dg Hand Complete Left  Result Date: 12/09/2018 CLINICAL DATA:  73 year old male with crush  injury of the left hand EXAM: LEFT HAND - COMPLETE 3+ VIEW COMPARISON:  None. FINDINGS: No acute displaced fracture. Degenerative changes of the interphalangeal joints. No subluxation/dislocation. No erosive changes. No osteopenia. Degenerative changes at the first carpometacarpal joint. Vascular calcifications. No radiopaque foreign body. Soft tissue swelling on the dorsum of the hand. IMPRESSION: Negative for acute bony abnormality. Questionable soft tissue swelling on the dorsum of the hand. No radiopaque foreign body. Osteoarthritis. Vascular calcifications. Electronically Signed   By: Corrie Mckusick D.O.   On: 12/09/2018 15:33    Assessment & Plan:   Lee Blevins was seen today for cellulitis.  Diagnoses and all orders for this visit:  Cellulitis of left hand excluding fingers and thumb- This has resolved.   I am having Lee Blevins maintain his albuterol, Alendronate Sodium (FOSAMAX PO), olmesartan, Advair Diskus, montelukast, and amoxicillin-clavulanate.  No orders of the defined types were placed in this encounter.    Follow-up: No follow-ups on file.  Scarlette Calico, MD

## 2019-02-10 ENCOUNTER — Telehealth: Payer: Self-pay | Admitting: Internal Medicine

## 2019-02-11 NOTE — Telephone Encounter (Signed)
Attempted to call patient, no answer, no voicemail picked up.

## 2019-02-11 NOTE — Telephone Encounter (Signed)
I would suggest updating his pneumovax 23 this year.   It can be given at same time as flu vax if appropriate.  He has had Prevnar 13 pneumonia vaccine. This is a "one time" shot and won't be repeated.

## 2019-02-11 NOTE — Telephone Encounter (Signed)
Spoke with pt. He wants to know if he needs to update his PNA vaccine.  CY - please advise. Thanks.  Immunization History  Administered Date(s) Administered  . Fluad Quad(high Dose 65+) 12/09/2018  . Influenza Split 01/17/2011, 01/31/2012  . Influenza Whole 02/18/2008, 04/07/2010  . Influenza, High Dose Seasonal PF 12/14/2015, 03/02/2017, 02/09/2018  . Influenza,inj,Quad PF,6+ Mos 01/13/2014, 12/31/2014  . Influenza-Unspecified 01/29/2013  . Pneumococcal Conjugate-13 06/24/2014  . Pneumococcal Polysaccharide-23 02/18/2008, 04/07/2010  . Tdap 08/05/2010, 12/09/2018

## 2019-02-12 ENCOUNTER — Ambulatory Visit: Payer: Medicare Other | Admitting: Internal Medicine

## 2019-02-17 NOTE — Telephone Encounter (Signed)
Spoke with pt. He is aware of CY's response. States that he will call back to make an appointment for this. Nothing further was needed.

## 2019-02-18 DIAGNOSIS — M301 Polyarteritis with lung involvement [Churg-Strauss]: Secondary | ICD-10-CM | POA: Diagnosis not present

## 2019-02-18 DIAGNOSIS — Z6822 Body mass index (BMI) 22.0-22.9, adult: Secondary | ICD-10-CM | POA: Diagnosis not present

## 2019-02-18 DIAGNOSIS — D72818 Other decreased white blood cell count: Secondary | ICD-10-CM | POA: Diagnosis not present

## 2019-02-18 DIAGNOSIS — M810 Age-related osteoporosis without current pathological fracture: Secondary | ICD-10-CM | POA: Diagnosis not present

## 2019-02-18 DIAGNOSIS — M150 Primary generalized (osteo)arthritis: Secondary | ICD-10-CM | POA: Diagnosis not present

## 2019-03-02 ENCOUNTER — Other Ambulatory Visit: Payer: Self-pay | Admitting: Internal Medicine

## 2019-03-04 ENCOUNTER — Telehealth: Payer: Self-pay | Admitting: Internal Medicine

## 2019-03-04 ENCOUNTER — Other Ambulatory Visit: Payer: Self-pay | Admitting: Internal Medicine

## 2019-03-04 MED ORDER — MONTELUKAST SODIUM 10 MG PO TABS: ORAL_TABLET | ORAL | 0 refills | Status: DC

## 2019-03-04 NOTE — Telephone Encounter (Signed)
Called and spoke with pt's wife [name]. Stated to [name] that pt was due for a f/u with CY. Let her know if we made an appt with pt, I would send refill of med to pharmacy for him and stated to her that he would need to keep that appt for further refills and she verbalized understanding. appt made for pt with CY 12/10 at 9am and med refill sent to pharmacy for pt. Nothing further needed.

## 2019-03-17 ENCOUNTER — Encounter: Payer: Self-pay | Admitting: Internal Medicine

## 2019-03-17 ENCOUNTER — Ambulatory Visit (INDEPENDENT_AMBULATORY_CARE_PROVIDER_SITE_OTHER): Payer: Medicare Other | Admitting: Internal Medicine

## 2019-03-17 ENCOUNTER — Other Ambulatory Visit: Payer: Self-pay

## 2019-03-17 ENCOUNTER — Other Ambulatory Visit (INDEPENDENT_AMBULATORY_CARE_PROVIDER_SITE_OTHER): Payer: Medicare Other

## 2019-03-17 VITALS — BP 146/78 | HR 91 | Temp 97.6°F | Resp 16 | Ht 73.0 in | Wt 168.2 lb

## 2019-03-17 DIAGNOSIS — I10 Essential (primary) hypertension: Secondary | ICD-10-CM

## 2019-03-17 DIAGNOSIS — R739 Hyperglycemia, unspecified: Secondary | ICD-10-CM

## 2019-03-17 DIAGNOSIS — I2510 Atherosclerotic heart disease of native coronary artery without angina pectoris: Secondary | ICD-10-CM

## 2019-03-17 LAB — BASIC METABOLIC PANEL
BUN: 12 mg/dL (ref 6–23)
CO2: 27 mEq/L (ref 19–32)
Calcium: 9.7 mg/dL (ref 8.4–10.5)
Chloride: 102 mEq/L (ref 96–112)
Creatinine, Ser: 0.79 mg/dL (ref 0.40–1.50)
GFR: 95.54 mL/min (ref >60.00)
Glucose, Bld: 159 mg/dL — ABNORMAL HIGH (ref 70–99)
Potassium: 3.7 mEq/L (ref 3.5–5.1)
Sodium: 138 mEq/L (ref 135–145)

## 2019-03-17 LAB — CBC WITH DIFFERENTIAL/PLATELET
Basophils Absolute: 0 10*3/uL (ref 0.0–0.1)
Basophils Relative: 0.8 % (ref 0.0–3.0)
Eosinophils Absolute: 0.4 10*3/uL (ref 0.0–0.7)
Eosinophils Relative: 10.2 % — ABNORMAL HIGH (ref 0.0–5.0)
HCT: 44.8 % (ref 39.0–52.0)
Hemoglobin: 15.1 g/dL (ref 13.0–17.0)
Lymphocytes Relative: 24.8 % (ref 12.0–46.0)
Lymphs Abs: 0.9 10*3/uL (ref 0.7–4.0)
MCHC: 33.7 g/dL (ref 30.0–36.0)
MCV: 100.8 fl — ABNORMAL HIGH (ref 78.0–100.0)
Monocytes Absolute: 0.4 10*3/uL (ref 0.1–1.0)
Monocytes Relative: 10.8 % (ref 3.0–12.0)
Neutro Abs: 2 10*3/uL (ref 1.4–7.7)
Neutrophils Relative %: 53.4 % (ref 43.0–77.0)
Platelets: 182 10*3/uL (ref 150.0–400.0)
RBC: 4.44 Mil/uL (ref 4.22–5.81)
RDW: 13.2 % (ref 11.5–15.5)
WBC: 3.8 10*3/uL — ABNORMAL LOW (ref 4.0–10.5)

## 2019-03-17 LAB — HEMOGLOBIN A1C: Hgb A1c MFr Bld: 5.6 % (ref 4.6–6.5)

## 2019-03-17 NOTE — Progress Notes (Signed)
Subjective:  Patient ID: Lee Blevins, male    DOB: 1945/05/18  Age: 73 y.o. MRN: [id number]  CC: Hypertension   HPI Lee Blevins presents for f/up - He tells me his blood pressure has been well controlled.  He is active and denies any recent episodes of CP, DOE, palpitations, edema, or fatigue.  Outpatient Medications Prior to Visit  Medication Sig Dispense Refill  . ADVAIR DISKUS 250-50 MCG/DOSE AEPB INHALE 1 INHALATION TWICE DAILY *RINSE MOUTH AFTERWARDS* 180 each 1  . albuterol (PROVENTIL HFA;VENTOLIN HFA) 108 (90 Base) MCG/ACT inhaler Inhale 2 puffs into the lungs every 6 (six) hours as needed for wheezing or shortness of breath. 1 Inhaler 11  . Alendronate Sodium (FOSAMAX PO) Take by mouth once a week.    . montelukast (SINGULAIR) 10 MG tablet TAKE 1 TABLET BY MOUTH EVERYDAY AT BEDTIME 90 tablet 0  . olmesartan (BENICAR) 20 MG tablet Take 1 tablet (20 mg total) by mouth daily. 90 tablet 1   No facility-administered medications prior to visit.     ROS Review of Systems  Constitutional: Negative for diaphoresis, fatigue and unexpected weight change.  HENT: Negative.   Eyes: Negative for visual disturbance.  Respiratory: Negative for cough, chest tightness, shortness of breath and wheezing.   Cardiovascular: Negative for chest pain, palpitations and leg swelling.  Gastrointestinal: Negative for abdominal pain, constipation, diarrhea, nausea and vomiting.  Endocrine: Negative.   Genitourinary: Negative.  Negative for difficulty urinating and dysuria.  Musculoskeletal: Negative.  Negative for arthralgias and myalgias.  Skin: Negative.  Negative for color change and pallor.  Neurological: Negative.  Negative for dizziness, weakness and light-headedness.  Hematological: Negative for adenopathy. Does not bruise/bleed easily.  Psychiatric/Behavioral: Negative.     Objective:  BP (!) 146/78 (BP Location: Left Arm, Patient Position: Sitting, Cuff Size: Normal)   Pulse 91   Temp  97.6 F (36.4 C) (Oral)   Resp 16   Ht 6\' 1"  (1.854 m)   Wt 168 lb 4 oz (76.3 kg)   SpO2 94%   BMI 22.20 kg/m   BP Readings from Last 3 Encounters:  03/17/19 (!) 146/78  12/16/18 (!) 146/84  12/09/18 (!) 142/82    Wt Readings from Last 3 Encounters:  03/17/19 168 lb 4 oz (76.3 kg)  12/16/18 168 lb (76.2 kg)  12/09/18 161 lb (73 kg)    Physical Exam Vitals signs reviewed.  Constitutional:      Appearance: Normal appearance.  HENT:     Nose: Nose normal.     Mouth/Throat:     Mouth: Mucous membranes are moist.  Eyes:     General: No scleral icterus.    Conjunctiva/sclera: Conjunctivae normal.  Neck:     Musculoskeletal: Neck supple.  Cardiovascular:     Rate and Rhythm: Normal rate and regular rhythm.     Heart sounds: No murmur. No gallop.   Pulmonary:     Effort: Pulmonary effort is normal.     Breath sounds: No stridor. No wheezing, rhonchi or rales.  Abdominal:     General: Abdomen is flat. Bowel sounds are normal. There is no distension.     Palpations: Abdomen is soft. There is no hepatomegaly or splenomegaly.     Tenderness: There is no abdominal tenderness.  Musculoskeletal: Normal range of motion.     Right lower leg: No edema.     Left lower leg: No edema.  Lymphadenopathy:     Cervical: No cervical adenopathy.  Skin:  General: Skin is warm and dry.     Coloration: Skin is not pale.  Neurological:     General: No focal deficit present.     Mental Status: He is alert.  Psychiatric:        Mood and Affect: Mood normal.        Behavior: Behavior normal.     Lab Results  Component Value Date   WBC 3.8 (L) 03/17/2019   HGB 15.1 03/17/2019   HCT 44.8 03/17/2019   PLT 182.0 03/17/2019   GLUCOSE 159 (H) 03/17/2019   CHOL 266 (H) 06/19/2018   TRIG 52.0 06/19/2018   HDL 124.40 06/19/2018   LDLDIRECT 160.0 06/13/2013   LDLCALC 132 (H) 06/19/2018   ALT 9 06/19/2018   AST 17 06/19/2018   NA 138 03/17/2019   K 3.7 03/17/2019   CL 102 03/17/2019    CREATININE 0.79 03/17/2019   BUN 12 03/17/2019   CO2 27 03/17/2019   TSH 1.62 06/19/2018   PSA 0.63 06/19/2018   INR 1.1 (H) 12/14/2015   HGBA1C 5.6 03/17/2019    Dg Hand Complete Left  Result Date: 12/09/2018 CLINICAL DATA:  73 year old male with crush injury of the left hand EXAM: LEFT HAND - COMPLETE 3+ VIEW COMPARISON:  None. FINDINGS: No acute displaced fracture. Degenerative changes of the interphalangeal joints. No subluxation/dislocation. No erosive changes. No osteopenia. Degenerative changes at the first carpometacarpal joint. Vascular calcifications. No radiopaque foreign body. Soft tissue swelling on the dorsum of the hand. IMPRESSION: Negative for acute bony abnormality. Questionable soft tissue swelling on the dorsum of the hand. No radiopaque foreign body. Osteoarthritis. Vascular calcifications. Electronically Signed   By: Corrie Mckusick D.O.   On: 12/09/2018 15:33    Assessment & Plan:   Lee Blevins was seen today for hypertension.  Diagnoses and all orders for this visit:  Essential hypertension- His blood pressure is adequately well controlled.  Electrolytes and renal function are normal.  Will continue the current dose of olmesartan. -     CBC with Differential; Future -     Hemoglobin A1c; Future -     Basic metabolic panel; Future  Hyperglycemia- His random blood sugar today is 159 but his A1c is at 5.6%.  He has mild prediabetes it does not need to be treated with a medication. -     Hemoglobin A1c; Future -     Basic metabolic panel; Future   I am having Lee Blevins maintain his albuterol, Alendronate Sodium (FOSAMAX PO), olmesartan, Advair Diskus, and montelukast.  No orders of the defined types were placed in this encounter.    Follow-up: Return in about 6 months (around 09/14/2019).  Scarlette Calico, MD

## 2019-03-17 NOTE — Patient Instructions (Signed)

## 2019-03-18 ENCOUNTER — Encounter: Payer: Self-pay | Admitting: Internal Medicine

## 2019-03-20 ENCOUNTER — Other Ambulatory Visit: Payer: Self-pay | Admitting: Internal Medicine

## 2019-03-20 DIAGNOSIS — I10 Essential (primary) hypertension: Secondary | ICD-10-CM

## 2019-03-20 DIAGNOSIS — I2510 Atherosclerotic heart disease of native coronary artery without angina pectoris: Secondary | ICD-10-CM

## 2019-03-20 DIAGNOSIS — I426 Alcoholic cardiomyopathy: Secondary | ICD-10-CM

## 2019-03-20 MED ORDER — OLMESARTAN MEDOXOMIL 20 MG PO TABS: 20.0000 mg | ORAL_TABLET | Freq: Every day | ORAL | 1 refills | Status: DC

## 2019-03-20 NOTE — Telephone Encounter (Signed)
Medication Refill - Medication: olmesartan (BENICAR) 20 MG tablet  Has the patient contacted their pharmacy? Yes - states that Dr. [name] told him at [facility] this was being called in but it hasn't. (Agent: If no, request that the patient contact the pharmacy for the refill.) (Agent: If yes, when and what did the pharmacy advise?)  Preferred Pharmacy (with phone number or street name):  [id number] - [place], [place]. AT [address] [phone] (Phone) [phone] (Fax)   Agent: Please be advised that RX refills may take up to 3 business days. We ask that you follow-up with your pharmacy.

## 2019-03-27 ENCOUNTER — Other Ambulatory Visit: Payer: Self-pay

## 2019-03-27 ENCOUNTER — Encounter: Payer: Self-pay | Admitting: Internal Medicine

## 2019-03-27 ENCOUNTER — Ambulatory Visit (INDEPENDENT_AMBULATORY_CARE_PROVIDER_SITE_OTHER): Payer: Medicare Other | Admitting: Internal Medicine

## 2019-03-27 ENCOUNTER — Telehealth: Payer: Self-pay | Admitting: Internal Medicine

## 2019-03-27 DIAGNOSIS — J328 Other chronic sinusitis: Secondary | ICD-10-CM | POA: Diagnosis not present

## 2019-03-27 DIAGNOSIS — I2510 Atherosclerotic heart disease of native coronary artery without angina pectoris: Secondary | ICD-10-CM | POA: Diagnosis not present

## 2019-03-27 DIAGNOSIS — J4540 Moderate persistent asthma, uncomplicated: Secondary | ICD-10-CM

## 2019-03-27 DIAGNOSIS — R918 Other nonspecific abnormal finding of lung field: Secondary | ICD-10-CM

## 2019-03-27 DIAGNOSIS — Z23 Encounter for immunization: Secondary | ICD-10-CM | POA: Diagnosis not present

## 2019-03-27 NOTE — Assessment & Plan Note (Signed)
Markedly improved, now not needing constant inhaler use. Plan- continue to follow

## 2019-03-27 NOTE — Patient Instructions (Signed)
Glad you have done so well !  Order- Pneumovax-23  Pneumonia vaccine  Please call if we can help

## 2019-03-27 NOTE — Progress Notes (Signed)
Subjective:    Patient ID: Lee Blevins, male    DOB: 10-04-1945, 73 y.o.   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 Started cytoxan 02/02/16  ------------------------------------------------------------   02/11/2018- 73 year old male former smoker followed for asthma/bronchitis, eosinophilia, lung nodules, chronic sinusitis, nasal polyps/Churg-Strauss/Cytoxan, chronic hypoxic respiratory failure, complicated by history CVA, HBP, CAD Imuran-Dr. Beekman/rheumatology -----Lung Nodules: Pt states his breathing is doing well overall.  Advair 250, Singulair 10 mg, albuterol HFA, Flonase, prednisone 5 mg daily, Imuran 50 mg daily Rheumatology is managing anti-inflammatory therapy with intent to slowly taper prednisone.  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 Breathing has been good-sinuses and chest feels clear and well.  He is not needed a rescue inhaler in a long time and uses Advair only for short intervals occasionally. CXR 05/16/17- IMPRESSION: 1. Hyperinflation, without acute disease. 2.  Aortic Atherosclerosis (ICD10-I70.0). 3. The right lower lobe diminutive pulmonary nodules described on prior CT would not be plain film evident.  03/27/2019- 73 year old male former smoker followed for asthma/bronchitis, eosinophilia, lung  nodules, chronic sinusitis, nasal polyps/Churg-Strauss/Cytoxan, chronic hypoxic respiratory failure, complicated by history CVA, HBP, CAD Finished Imuran 6 months ago-Dr. Beekman/rheumatology -----f/u Asthma/Bronchitis Advair 250, albuterol hfa, singulair Rhinosinusitis and steroid dependent asthma manifestations of Churg-Straus has substantially resolved and he has been able to stay off immunosuppression and systemic steroids. Not using Advair every day, and not used rescue inhaler in a year. Little cough or wheeze. Had ablation for Afib and still has pacer. Tends to get bradycardia into 50's, being followed by Cardiology.   ROS-see HPI    + = positive Constitutional:   No-   weight loss, night sweats, fevers, chills, fatigue, lassitude. HEENT:   No-  headaches, difficulty swallowing, tooth/dental problems, sore throat,       No-  sneezing, itching, ear ache, nasal congestion, post nasal drip,  CV:  No-   chest pain, orthopnea, PND, swelling in lower extremities, anasarca, dizziness, palpitations Resp:  shortness of breath with exertion or at rest.              productive cough,  No non-productive cough,  No- coughing up of blood.              No-   change in color of mucus.  No- wheezing.   Skin: No-   rash or lesions. GI:  No-   heartburn, indigestion, abdominal pain, nausea, vomiting,  GU:  MS:  No-   joint pain or swelling.   Neuro-     nothing unusual Psych:  No- change in mood or affect. No depression or anxiety.  No memory loss.  Objective:   Physical Exam General- Alert, Oriented, Affect-appropriate, Distress- none acute, thin Skin- rash-none, lesions- none, excoriation- none Lymphadenopathy- none Head- atraumatic            Eyes- Gross vision intact, PERRLA, conjunctivae clear secretions            Ears- Hearing, canals normal  Nose- Clear, No- Septal dev, mucus -none, polyp-none seen, no-erosion, perforation             Throat- Mallampati II-III , mucosa+mild  erythema posterior pharynx, drainage- none, tonsils- atrophic, missing teeth. Neck- flexible , trachea midline, no stridor , thyroid nl, carotid no bruit Chest - symmetrical excursion , unlabored           Heart/CV- RRR , no murmur , no gallop  , no rub, nl s1 s2                           - JVD- none , edema- none, stasis changes- none, varices- none           Lung-  wheeze-none , cough- none , dullness-none, rub- none, unlabored           Chest wall-  +pacemaker L Abd-  Br/ Gen/ Rectal- Not done, not indicated Extrem- cyanosis- none, clubbing, none, atrophy- none, strength- nl Neuro- +mild myoclonic twitching/ jerking which he tries to minimize  Assessment & Plan:

## 2019-03-27 NOTE — Telephone Encounter (Signed)
After he got Pvax-23 at office visit today, he went home. Was out blowing leaves this evening, got light headed and may have had transient syncope. Wife says HR in 50's and O2 sat 89%. She had him lying on floor with pillow under head. HR now in ow 90's and he seems ok. Suspect this was hypotension, possibly related to known bradycardia. Unclear if it had anything to do with the pneumonia vaccine. Plan- rest, move slowly this evening, drink fluids. Ok to skip BP med tonight. If it happens again notify his cardiologist.

## 2019-03-27 NOTE — Assessment & Plan Note (Signed)
His pattern began with chronic sinus disease and nasal polyps. Has been asymptomatic after Imuran ending in 2020.

## 2019-03-27 NOTE — Assessment & Plan Note (Signed)
Too small to see on CXR 05/16/2017

## 2019-05-03 ENCOUNTER — Ambulatory Visit: Payer: Medicare Other | Attending: Internal Medicine

## 2019-05-03 DIAGNOSIS — Z23 Encounter for immunization: Secondary | ICD-10-CM | POA: Diagnosis not present

## 2019-05-03 NOTE — Progress Notes (Signed)
   Covid-19 Vaccination Clinic  Name:  Lee Blevins    MRN: [id number] DOB: May 08, 1945  05/03/2019  Lee Blevins was observed post Covid-19 immunization for 15 minutes without incidence. He was provided with Vaccine Information Sheet and instruction to access the V-Safe system.   Lee Blevins was instructed to call 911 with any severe reactions post vaccine: Marland Kitchen Difficulty breathing  . Swelling of your face and throat  . A fast heartbeat  . A bad rash all over your body  . Dizziness and weakness    Immunizations Administered    Name Date Dose VIS Date Route   Pfizer COVID-19 Vaccine 05/03/2019  1:32 PM 0.3 mL 03/28/2019 Intramuscular   Manufacturer: [place]   Lot: [id number]   [place]: [id number]

## 2019-05-14 ENCOUNTER — Telehealth: Payer: Self-pay | Admitting: Internal Medicine

## 2019-05-14 MED ORDER — ALBUTEROL SULFATE HFA 108 (90 BASE) MCG/ACT IN AERS: 2.0000 | INHALATION_SPRAY | Freq: Four times a day (QID) | RESPIRATORY_TRACT | 11 refills | Status: DC | PRN

## 2019-05-14 NOTE — Telephone Encounter (Signed)
Albuterol inhaler refill sent to preferred pharmacy for pt. Called and spoke with pt's wife [name] letting her know this had been done and she verbalized understanding. Nothing further needed.

## 2019-05-23 ENCOUNTER — Ambulatory Visit: Payer: Medicare Other | Attending: Internal Medicine

## 2019-05-23 DIAGNOSIS — Z23 Encounter for immunization: Secondary | ICD-10-CM | POA: Insufficient documentation

## 2019-05-23 NOTE — Progress Notes (Signed)
   Covid-19 Vaccination Clinic  Name:  Davious Oen    MRN: [id number] DOB: [birth year]  05/23/2019  Mr. Puello was observed post Covid-19 immunization for 15 minutes without incidence. He was provided with Vaccine Information Sheet and instruction to access the V-Safe system.   Mr. Zani was instructed to call 911 with any severe reactions post vaccine: Marland Kitchen Difficulty breathing  . Swelling of your face and throat  . A fast heartbeat  . A bad rash all over your body  . Dizziness and weakness    Immunizations Administered    Name Date Dose VIS Date Route   Pfizer COVID-19 Vaccine 05/23/2019  2:25 PM 0.3 mL 03/28/2019 Intramuscular   Manufacturer: [place]   Lot: [id number]   [place]: [id number]

## 2019-06-01 ENCOUNTER — Other Ambulatory Visit: Payer: Self-pay | Admitting: Internal Medicine

## 2019-08-19 ENCOUNTER — Encounter: Payer: Self-pay | Admitting: Internal Medicine

## 2019-08-19 DIAGNOSIS — E559 Vitamin D deficiency, unspecified: Secondary | ICD-10-CM | POA: Diagnosis not present

## 2019-08-19 DIAGNOSIS — Z6821 Body mass index (BMI) 21.0-21.9, adult: Secondary | ICD-10-CM | POA: Diagnosis not present

## 2019-08-19 DIAGNOSIS — D72818 Other decreased white blood cell count: Secondary | ICD-10-CM | POA: Diagnosis not present

## 2019-08-19 DIAGNOSIS — M150 Primary generalized (osteo)arthritis: Secondary | ICD-10-CM | POA: Diagnosis not present

## 2019-08-19 DIAGNOSIS — M810 Age-related osteoporosis without current pathological fracture: Secondary | ICD-10-CM | POA: Diagnosis not present

## 2019-08-19 DIAGNOSIS — M301 Polyarteritis with lung involvement [Churg-Strauss]: Secondary | ICD-10-CM | POA: Diagnosis not present

## 2019-09-11 DIAGNOSIS — H2513 Age-related nuclear cataract, bilateral: Secondary | ICD-10-CM | POA: Diagnosis not present

## 2019-09-16 ENCOUNTER — Ambulatory Visit: Payer: Medicare Other | Admitting: Internal Medicine

## 2019-11-11 ENCOUNTER — Other Ambulatory Visit: Payer: Self-pay | Admitting: Internal Medicine

## 2019-12-01 DIAGNOSIS — M810 Age-related osteoporosis without current pathological fracture: Secondary | ICD-10-CM | POA: Diagnosis not present

## 2019-12-01 DIAGNOSIS — Z6823 Body mass index (BMI) 23.0-23.9, adult: Secondary | ICD-10-CM | POA: Diagnosis not present

## 2019-12-01 DIAGNOSIS — E559 Vitamin D deficiency, unspecified: Secondary | ICD-10-CM | POA: Diagnosis not present

## 2019-12-01 DIAGNOSIS — M150 Primary generalized (osteo)arthritis: Secondary | ICD-10-CM | POA: Diagnosis not present

## 2019-12-01 DIAGNOSIS — D72818 Other decreased white blood cell count: Secondary | ICD-10-CM | POA: Diagnosis not present

## 2019-12-01 DIAGNOSIS — M301 Polyarteritis with lung involvement [Churg-Strauss]: Secondary | ICD-10-CM | POA: Diagnosis not present

## 2019-12-14 ENCOUNTER — Other Ambulatory Visit: Payer: Self-pay | Admitting: Internal Medicine

## 2019-12-14 DIAGNOSIS — I2510 Atherosclerotic heart disease of native coronary artery without angina pectoris: Secondary | ICD-10-CM

## 2019-12-14 DIAGNOSIS — I10 Essential (primary) hypertension: Secondary | ICD-10-CM

## 2019-12-14 DIAGNOSIS — I426 Alcoholic cardiomyopathy: Secondary | ICD-10-CM

## 2020-03-25 NOTE — Progress Notes (Signed)
Subjective:    Patient ID: Lee Blevins, male    DOB: 10/22/1945, 74 y.o.   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 Started cytoxan 02/02/16  ------------------------------------------------------------   03/27/2019- 74 year old male former smoker followed for asthma/bronchitis, eosinophilia, lung nodules, chronic sinusitis, nasal polyps/Churg-Strauss/Cytoxan, chronic hypoxic respiratory failure, complicated by HBP, CAD Finished Imuran 6 months ago-Dr. Beekman/rheumatology -----f/u Asthma/Bronchitis Advair 250, albuterol hfa, singulair Rhinosinusitis and steroid dependent asthma manifestations of Churg-Straus has substantially resolved and he has been able to stay off immunosuppression and systemic steroids. Not using Advair every day, and not used rescue inhaler in a year. Little cough or wheeze. Had ablation for Afib and still has pacer. Tends to get bradycardia into 50's, being followed by Cardiology.   03/26/20- 74 year old male former smoker followed for asthma/bronchitis, eosinophilia, lung nodules, chronic sinusitis, nasal polyps/Churg-Strauss/Cytoxan then Imuran, hx chronic hypoxic respiratory failure, complicated by HBP, Myoclonic jerking,  -Advair 250, albuterol hfa, singulair- Covid vax- 2 Phizer Flu vax- had Uses Advair but rarely needs rescue inhaler. Denies symptoms of  sinusitis or asthma. Big difference after he was treated with immunosuppression for eosinophilic vasculitis/ Churg-Strauss.   ROS-see HPI    + = positive Constitutional:   No-   weight loss, night sweats, fevers, chills, fatigue, lassitude. HEENT:   No-  headaches, difficulty swallowing, tooth/dental problems, sore throat,       No-  sneezing, itching, ear ache, nasal congestion, post nasal drip,  CV:  No-   chest pain, orthopnea, PND, swelling in lower extremities, anasarca, dizziness, palpitations Resp:  shortness of breath with exertion or at rest.              productive cough,  No non-productive cough,  No- coughing up of blood.              No-   change in color of mucus.  No- wheezing.   Skin: No-   rash or lesions. GI:  No-   heartburn, indigestion, abdominal pain, nausea, vomiting,  GU:  MS:  No-   joint pain or swelling.   Neuro-     nothing unusual Psych:  No- change in mood or affect. No depression or anxiety.  No memory loss.  Objective:   Physical Exam General- Alert, Oriented, Affect-appropriate, Distress- none acute, thin Skin- rash-none, lesions- none, excoriation- none Lymphadenopathy- none Head- atraumatic            Eyes- Gross vision intact, PERRLA, conjunctivae clear secretions            Ears- Hearing, canals normal            Nose- Clear, No- Septal dev, mucus -none, polyp-none seen, no-erosion, perforation             Throat- Mallampati II-III , mucosa+mild erythema posterior pharynx, drainage- none, tonsils- atrophic, missing teeth. Neck- flexible , trachea midline, no stridor , thyroid nl, carotid no bruit Chest - symmetrical excursion , unlabored           Heart/CV- RRR , no murmur ,  no gallop  , no rub, nl s1 s2                           - JVD- none , edema- none, stasis changes- none, varices- none           Lung-  wheeze-none , cough- none , dullness-none, rub- none, unlabored           Chest wall-  +pacemaker L Abd-  Br/ Gen/ Rectal- Not done, not  indicated Extrem- cyanosis- none, clubbing, none, atrophy- none, strength- nl Neuro- +mild myoclonic twitching/ jerking which he tries to minimize  Assessment & Plan:

## 2020-03-26 ENCOUNTER — Ambulatory Visit (INDEPENDENT_AMBULATORY_CARE_PROVIDER_SITE_OTHER): Payer: Medicare Other | Admitting: Internal Medicine

## 2020-03-26 ENCOUNTER — Other Ambulatory Visit: Payer: Self-pay

## 2020-03-26 ENCOUNTER — Encounter: Payer: Self-pay | Admitting: Internal Medicine

## 2020-03-26 DIAGNOSIS — J328 Other chronic sinusitis: Secondary | ICD-10-CM | POA: Diagnosis not present

## 2020-03-26 DIAGNOSIS — J4540 Moderate persistent asthma, uncomplicated: Secondary | ICD-10-CM | POA: Diagnosis not present

## 2020-03-26 DIAGNOSIS — I2510 Atherosclerotic heart disease of native coronary artery without angina pectoris: Secondary | ICD-10-CM | POA: Diagnosis not present

## 2020-03-26 NOTE — Patient Instructions (Signed)
We can continue current meds  Sure glad you are doing well. Please call if we can help

## 2020-03-28 ENCOUNTER — Encounter: Payer: Self-pay | Admitting: Internal Medicine

## 2020-03-28 NOTE — Assessment & Plan Note (Signed)
Sinus disease and asthma symptoms virtually eliminated. He still uses Advair and should be slow to consider transition to once daily.

## 2020-03-28 NOTE — Assessment & Plan Note (Signed)
Chronic sinus disease and polyps are in sustained remission after immunosuppressive therapy by Rheumatology. Plan- observation for relapse

## 2020-05-07 ENCOUNTER — Other Ambulatory Visit: Payer: Self-pay | Admitting: Internal Medicine

## 2020-06-04 ENCOUNTER — Other Ambulatory Visit: Payer: Self-pay | Admitting: Internal Medicine

## 2020-11-01 ENCOUNTER — Other Ambulatory Visit: Payer: Self-pay | Admitting: Internal Medicine

## 2020-12-09 ENCOUNTER — Other Ambulatory Visit: Payer: Self-pay | Admitting: Internal Medicine

## 2020-12-09 DIAGNOSIS — M8580 Other specified disorders of bone density and structure, unspecified site: Secondary | ICD-10-CM

## 2021-01-11 ENCOUNTER — Inpatient Hospital Stay (HOSPITAL_COMMUNITY): Payer: Medicare Other

## 2021-01-11 ENCOUNTER — Inpatient Hospital Stay (HOSPITAL_COMMUNITY)
Admission: EM | Admit: 2021-01-11 | Discharge: 2021-01-17 | DRG: 065 | Disposition: A | Discharge status: Rehabilitation Facility | Payer: Medicare Other | Attending: Internal Medicine | Admitting: Internal Medicine

## 2021-01-11 ENCOUNTER — Other Ambulatory Visit: Payer: Self-pay

## 2021-01-11 ENCOUNTER — Encounter (HOSPITAL_COMMUNITY): Payer: Self-pay | Admitting: Emergency Medicine

## 2021-01-11 ENCOUNTER — Emergency Department (HOSPITAL_COMMUNITY): Payer: Medicare Other

## 2021-01-11 DIAGNOSIS — Z87891 Personal history of nicotine dependence: Secondary | ICD-10-CM

## 2021-01-11 DIAGNOSIS — Z20822 Contact with and (suspected) exposure to covid-19: Secondary | ICD-10-CM | POA: Diagnosis present

## 2021-01-11 DIAGNOSIS — Z79899 Other long term (current) drug therapy: Secondary | ICD-10-CM | POA: Diagnosis not present

## 2021-01-11 DIAGNOSIS — J45909 Unspecified asthma, uncomplicated: Secondary | ICD-10-CM | POA: Diagnosis present

## 2021-01-11 DIAGNOSIS — I609 Nontraumatic subarachnoid hemorrhage, unspecified: Secondary | ICD-10-CM | POA: Diagnosis present

## 2021-01-11 DIAGNOSIS — I611 Nontraumatic intracerebral hemorrhage in hemisphere, cortical: Secondary | ICD-10-CM | POA: Diagnosis present

## 2021-01-11 DIAGNOSIS — K7460 Unspecified cirrhosis of liver: Secondary | ICD-10-CM | POA: Diagnosis present

## 2021-01-11 DIAGNOSIS — R2689 Other abnormalities of gait and mobility: Secondary | ICD-10-CM | POA: Diagnosis present

## 2021-01-11 DIAGNOSIS — I10 Essential (primary) hypertension: Secondary | ICD-10-CM | POA: Diagnosis present

## 2021-01-11 DIAGNOSIS — I2510 Atherosclerotic heart disease of native coronary artery without angina pectoris: Secondary | ICD-10-CM | POA: Diagnosis present

## 2021-01-11 DIAGNOSIS — R471 Dysarthria and anarthria: Secondary | ICD-10-CM | POA: Diagnosis present

## 2021-01-11 DIAGNOSIS — Z8673 Personal history of transient ischemic attack (TIA), and cerebral infarction without residual deficits: Secondary | ICD-10-CM

## 2021-01-11 DIAGNOSIS — I619 Nontraumatic intracerebral hemorrhage, unspecified: Principal | ICD-10-CM | POA: Diagnosis present

## 2021-01-11 DIAGNOSIS — I680 Cerebral amyloid angiopathy: Secondary | ICD-10-CM | POA: Diagnosis present

## 2021-01-11 DIAGNOSIS — E785 Hyperlipidemia, unspecified: Secondary | ICD-10-CM | POA: Diagnosis present

## 2021-01-11 DIAGNOSIS — H538 Other visual disturbances: Secondary | ICD-10-CM | POA: Diagnosis present

## 2021-01-11 DIAGNOSIS — J9611 Chronic respiratory failure with hypoxia: Secondary | ICD-10-CM | POA: Diagnosis present

## 2021-01-11 DIAGNOSIS — R29705 NIHSS score 5: Secondary | ICD-10-CM | POA: Diagnosis present

## 2021-01-11 DIAGNOSIS — M810 Age-related osteoporosis without current pathological fracture: Secondary | ICD-10-CM | POA: Diagnosis present

## 2021-01-11 DIAGNOSIS — H269 Unspecified cataract: Secondary | ICD-10-CM | POA: Diagnosis present

## 2021-01-11 DIAGNOSIS — E7800 Pure hypercholesterolemia, unspecified: Secondary | ICD-10-CM | POA: Diagnosis not present

## 2021-01-11 DIAGNOSIS — N400 Enlarged prostate without lower urinary tract symptoms: Secondary | ICD-10-CM | POA: Diagnosis present

## 2021-01-11 DIAGNOSIS — H53461 Homonymous bilateral field defects, right side: Secondary | ICD-10-CM | POA: Diagnosis present

## 2021-01-11 DIAGNOSIS — E854 Organ-limited amyloidosis: Secondary | ICD-10-CM | POA: Diagnosis present

## 2021-01-11 DIAGNOSIS — Z7951 Long term (current) use of inhaled steroids: Secondary | ICD-10-CM | POA: Diagnosis not present

## 2021-01-11 DIAGNOSIS — I69222 Dysarthria following other nontraumatic intracranial hemorrhage: Secondary | ICD-10-CM | POA: Diagnosis present

## 2021-01-11 DIAGNOSIS — Z7983 Long term (current) use of bisphosphonates: Secondary | ICD-10-CM

## 2021-01-11 DIAGNOSIS — R270 Ataxia, unspecified: Secondary | ICD-10-CM | POA: Diagnosis present

## 2021-01-11 DIAGNOSIS — Z8249 Family history of ischemic heart disease and other diseases of the circulatory system: Secondary | ICD-10-CM | POA: Diagnosis not present

## 2021-01-11 DIAGNOSIS — Z7952 Long term (current) use of systemic steroids: Secondary | ICD-10-CM

## 2021-01-11 DIAGNOSIS — R4701 Aphasia: Secondary | ICD-10-CM | POA: Diagnosis present

## 2021-01-11 DIAGNOSIS — I69298 Other sequelae of other nontraumatic intracranial hemorrhage: Secondary | ICD-10-CM | POA: Diagnosis not present

## 2021-01-11 DIAGNOSIS — I6389 Other cerebral infarction: Secondary | ICD-10-CM | POA: Diagnosis not present

## 2021-01-11 DIAGNOSIS — Z888 Allergy status to other drugs, medicaments and biological substances status: Secondary | ICD-10-CM | POA: Diagnosis not present

## 2021-01-11 DIAGNOSIS — I69220 Aphasia following other nontraumatic intracranial hemorrhage: Secondary | ICD-10-CM | POA: Diagnosis not present

## 2021-01-11 LAB — CBC
HCT: 44.1 % (ref 39.0–52.0)
Hemoglobin: 14.6 g/dL (ref 13.0–17.0)
MCH: 33.2 pg (ref 26.0–34.0)
MCHC: 33.1 g/dL (ref 30.0–36.0)
MCV: 100.2 fL — ABNORMAL HIGH (ref 80.0–100.0)
Platelets: 193 10*3/uL (ref 150–400)
RBC: 4.4 MIL/uL (ref 4.22–5.81)
RDW: 12.6 % (ref 11.5–15.5)
WBC: 5.9 10*3/uL (ref 4.0–10.5)
nRBC: 0 % (ref 0.0–0.2)

## 2021-01-11 LAB — COMPREHENSIVE METABOLIC PANEL
ALT: 14 U/L (ref 0–44)
AST: 22 U/L (ref 15–41)
Albumin: 4 g/dL (ref 3.5–5.0)
Alkaline Phosphatase: 55 U/L (ref 38–126)
Anion gap: 8 (ref 5–15)
BUN: 17 mg/dL (ref 8–23)
CO2: 28 mmol/L (ref 22–32)
Calcium: 10 mg/dL (ref 8.9–10.3)
Chloride: 107 mmol/L (ref 98–111)
Creatinine, Ser: 0.9 mg/dL (ref 0.61–1.24)
GFR, Estimated: >60 mL/min (ref >60)
Glucose, Bld: 146 mg/dL — ABNORMAL HIGH (ref 70–99)
Potassium: 4.5 mmol/L (ref 3.5–5.1)
Sodium: 143 mmol/L (ref 135–145)
Total Bilirubin: 1 mg/dL (ref 0.3–1.2)
Total Protein: 7.4 g/dL (ref 6.5–8.1)

## 2021-01-11 LAB — MRSA NEXT GEN BY PCR, NASAL: MRSA by PCR Next Gen: NOT DETECTED

## 2021-01-11 LAB — DIFFERENTIAL
Abs Immature Granulocytes: 0.02 10*3/uL (ref 0.00–0.07)
Basophils Absolute: 0.1 10*3/uL (ref 0.0–0.1)
Basophils Relative: 1 %
Eosinophils Absolute: 0.1 10*3/uL (ref 0.0–0.5)
Eosinophils Relative: 2 %
Immature Granulocytes: 0 %
Lymphocytes Relative: 8 %
Lymphs Abs: 0.5 10*3/uL — ABNORMAL LOW (ref 0.7–4.0)
Monocytes Absolute: 0.4 10*3/uL (ref 0.1–1.0)
Monocytes Relative: 7 %
Neutro Abs: 4.8 10*3/uL (ref 1.7–7.7)
Neutrophils Relative %: 82 %

## 2021-01-11 LAB — URINALYSIS, ROUTINE W REFLEX MICROSCOPIC
Bilirubin Urine: NEGATIVE
Glucose, UA: NEGATIVE mg/dL
Hgb urine dipstick: NEGATIVE
Ketones, ur: NEGATIVE mg/dL
Leukocytes,Ua: NEGATIVE
Nitrite: NEGATIVE
Protein, ur: NEGATIVE mg/dL
Specific Gravity, Urine: <1.005 — ABNORMAL LOW (ref 1.005–1.030)
pH: 6.5 (ref 5.0–8.0)

## 2021-01-11 LAB — I-STAT CHEM 8, ED
BUN: 14 mg/dL (ref 8–23)
Calcium, Ion: 1.24 mmol/L (ref 1.15–1.40)
Chloride: 102 mmol/L (ref 98–111)
Creatinine, Ser: 0.8 mg/dL (ref 0.61–1.24)
Glucose, Bld: 143 mg/dL — ABNORMAL HIGH (ref 70–99)
HCT: 45 % (ref 39.0–52.0)
Hemoglobin: 15.3 g/dL (ref 13.0–17.0)
Potassium: 4.3 mmol/L (ref 3.5–5.1)
Sodium: 135 mmol/L (ref 135–145)
TCO2: 25 mmol/L (ref 22–32)

## 2021-01-11 LAB — RAPID URINE DRUG SCREEN, HOSP PERFORMED
Amphetamines: NOT DETECTED
Barbiturates: NOT DETECTED
Benzodiazepines: NOT DETECTED
Cocaine: NOT DETECTED
Opiates: NOT DETECTED
Tetrahydrocannabinol: NOT DETECTED

## 2021-01-11 LAB — ETHANOL: Alcohol, Ethyl (B): <10 mg/dL (ref <10)

## 2021-01-11 LAB — PROTIME-INR
INR: 1 (ref 0.8–1.2)
Prothrombin Time: 13.1 seconds (ref 11.4–15.2)

## 2021-01-11 LAB — APTT: aPTT: 33 seconds (ref 24–36)

## 2021-01-11 LAB — VITAMIN B12: Vitamin B-12: 194 pg/mL (ref 180–914)

## 2021-01-11 MED ORDER — STROKE: EARLY STAGES OF RECOVERY BOOK
Freq: Once | Status: AC
Start: 2021-01-11 — End: 2021-01-12
  Filled 2021-01-11 (×2): qty 1

## 2021-01-11 MED ORDER — NICARDIPINE HCL IN NACL 20-0.86 MG/200ML-% IV SOLN
0.0000 mg/h | INTRAVENOUS | Status: DC
  Administered 2021-01-11: 5 mg/h via INTRAVENOUS
  Administered 2021-01-11 (×2): 15 mg/h via INTRAVENOUS
  Administered 2021-01-11: 2.5 mg/h via INTRAVENOUS
  Filled 2021-01-11 (×5): qty 200

## 2021-01-11 MED ORDER — ACETAMINOPHEN 160 MG/5ML PO SOLN
650.0000 mg | ORAL | Status: DC | PRN
Start: 2021-01-11 — End: 2021-01-17

## 2021-01-11 MED ORDER — GADOBUTROL 1 MMOL/ML IV SOLN
8.0000 mL | Freq: Once | INTRAVENOUS | Status: AC | PRN
  Administered 2021-01-11: 8 mL via INTRAVENOUS

## 2021-01-11 MED ORDER — ACETAMINOPHEN 325 MG PO TABS
650.0000 mg | ORAL_TABLET | ORAL | Status: DC | PRN
Start: 2021-01-11 — End: 2021-01-17

## 2021-01-11 MED ORDER — TRAMADOL HCL 50 MG PO TABS: 50.0000 mg | ORAL_TABLET | Freq: Two times a day (BID) | ORAL | Status: DC | PRN

## 2021-01-11 MED ORDER — LABETALOL HCL 5 MG/ML IV SOLN
20.0000 mg | Freq: Once | INTRAVENOUS | Status: AC
  Administered 2021-01-11: 20 mg via INTRAVENOUS
  Filled 2021-01-11: qty 4

## 2021-01-11 MED ORDER — CHLORHEXIDINE GLUCONATE CLOTH 2 % EX PADS
6.0000 | MEDICATED_PAD | Freq: Every day | CUTANEOUS | Status: DC
  Administered 2021-01-11 – 2021-01-13 (×3): 6 via TOPICAL

## 2021-01-11 MED ORDER — SENNOSIDES-DOCUSATE SODIUM 8.6-50 MG PO TABS
1.0000 | ORAL_TABLET | Freq: Two times a day (BID) | ORAL | Status: DC
Start: 2021-01-11 — End: 2021-01-17
  Administered 2021-01-12 – 2021-01-17 (×8): 1 via ORAL
  Filled 2021-01-11 (×9): qty 1

## 2021-01-11 MED ORDER — NICARDIPINE HCL IN NACL 20-0.86 MG/200ML-% IV SOLN: 0.0000 mg/h | INTRAVENOUS | Status: DC

## 2021-01-11 MED ORDER — ACETAMINOPHEN 650 MG RE SUPP
650.0000 mg | RECTAL | Status: DC | PRN
Start: 2021-01-11 — End: 2021-01-17

## 2021-01-11 MED ORDER — PANTOPRAZOLE SODIUM 40 MG IV SOLR
40.0000 mg | Freq: Every day | INTRAVENOUS | Status: DC
  Administered 2021-01-12 – 2021-01-14 (×4): 40 mg via INTRAVENOUS
  Filled 2021-01-11 (×4): qty 40

## 2021-01-11 NOTE — H&P (Addendum)
Neurology Admission H&P    Chief Complaint: aphasia, and vision changes with ICH found on [facility].   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 Patient presented 2 hours ago with c/o aphasia and difficulty word finding. CTH showed an acute hemorrhagic left occipital hemorrhage measuring 3.7cm.   Wife states they vacationed at the beach and drove home on 01/09/21 with no abnormal events. On Sunday 9/25 before bed, patient noted some difficulty with peripheral vision in the OD with no other symptoms or changes. On Monday afternoon, patient went to ophthamologist. Cataracts were a tad worse, but no corneal abrasion, retinal detachment, BRAO, or CRAO was found. Afer appointment, he still felt fine and went home and did some pruning in his yard, but no strenuous activity. This a.m., his wife noted he was slow to answer questions with word finding difficulty, naming objects incorrectly, and depth perception issues. No fall. No head trauma. BP at home was in the 1 teens. Wife then brought him to [facility] ED. On arrival here BP was 150s.   Patient feels fine other than vision and difficulty speaking. No weakness of a limb, no numbness or tingling, no sensation loss, no dysphagia, no ataxia or difficulty ambulating, and no dizziness nor light headedness.   Aggressive BP control started in ED with Labatelol pushes and Cardene drip.   Former tobacco abuse, quite 40 years ago. Has 3 glasses of wine a night and has never been told by a MD that he had cirrhosis.   No FMHx of stroke or brain hemorrhage.   LKW: 01/09/21.  MRS: 0  Past Medical History:  Diagnosis Date   Asthma    Chronic sinusitis    History of CVA (cerebrovascular accident) 2011   hemorrhagic   HTN (hypertension)     Hyperlipidemia    Nasal polyps    Pneumonia     Past Surgical History:  Procedure Laterality Date   NASAL POLYP EXCISION     x2   TONSILLECTOMY      Family History  Problem Relation Age of Onset   Heart disease Father    Valvular heart disease Father    Neurologic Disorder Mother 76       Similar to MS   Cancer Neg Hx    Early death Neg Hx    Hearing loss Neg Hx    Hyperlipidemia Neg Hx    Hypertension Neg Hx    Stroke Neg Hx    Kidney disease Neg Hx    Social History:  reports that he quit smoking about 47 years ago. His smoking use included cigarettes. He has a 1.00 pack-year smoking history. He has never used smokeless tobacco. He reports current alcohol use of about 15.0 standard drinks per week. He reports that he does not use drugs.  Allergies:  Allergies  Allergen Reactions   Amlodipine     edema   Crestor [Rosuvastatin]     Muscle aches   Livalo [Pitavastatin]     Muscle aches   Bystolic [Nebivolol Hcl]     dizziness   Medications PTA: Albuterol inhaler, Advair inhaler, Singulair, Benicar, Prednisone 5mg  po qd.   ROS: A robust ROS is reviewed and is negative except as noted in the HPI.   Physical Examination: General: Appears well,  NAD. Wife at bedside.  Psych: Affect appropriate. Calm. Cooperative.  HEENT-  Normocephalic, AT. No lymphadenopathy. No thyromegaly. No stiffness of neck.  Cardiovascular - RRR. No LE edema.  Lungs - Normal respiratory effort. Abdomen - soft, NT. Extremities - warm, well perfused. Skin: WDI.  NIHSS:  1a Level of Consciousness: 0 1b LOC Questions: 0 1c LOC Commands: 0 2 Best Gaze: 0 3 Visual: 1 4 Facial Palsy: 0 5a Motor Arm - Left: 0 5b Motor Arm - Right: 0 6a Motor Leg - Left: 0 6b Motor Leg - Right: 0 7 Limb Ataxia: 1 8 Sensory: 0 9 Best Language: 1 10 Dysarthria: 1 11 Extinct and Inattention: 0 TOTAL: 4  Neuro exam:   Mental Status: Awake and alert. Is able to follow commands. Oriented to person, place,  city, state. Unable to state correct month, year, day, or date.   Speech/Language: speech is with mild dysarthria. Expressive aphasia noted with difficulty naming objects. Some hesitancy in speech when word searching. Slight issue with repetition. Comprehension slowed.   Cranial Nerves:  II: PERRL. OD homonymous lateral hemianopsia.  III, IV, VI: EOMI. Eyelids elevate symmetrically.  V: Sensation is intact to light touch and symmetrical to face.  VII: Smile is symmetrical.   VIII: hearing intact to voice. IX, X: Palate elevates symmetrically. Phonation is normal.  YJ:[id number] shrug 5/5. XII: tongue is midline without fasciculations. Motor:  All muscle groups 5/5.  Tone is normal and bulk is normal. Sensation- Intact to light touch bilaterally. Extinction absent to DSS. Coordination: FTN intact bilaterally but slightly ataxic on the right likely due to hemianopsia. No drift.  Plantars: downgoing.  Gait- deferred.  Labs pertinent to this encounter: UA negative. UDS negative. Ethanol < 10. INR 1. aPTT 33. creat .8. glucose 143.  B12 low in past. LDL 132 in 2020. A1c 5.6 2020.   Imaging: Independently reviewed by MD.   [place] of acute hemorrhage within the left occipital lobe measuring up to 3.7 cm. Atrophy, chronic small vessel disease.   Assessment:75 yr old male who presented today with aphasia and visual cut to OD. Found to have [place] on [place]. Stroke risk factors are previous hemorrhagic stroke, HTN, HLD, remote tobacco abuse, and ETOH use/overuse. His hemorrhage in 2011 was attributed to HTN. Today, his BP was not very high (150s) on admission, so BP may not be etiology this time. We will r/o intracranial aneurysm with CTA head. However, doubt rupture because he had no HA. His visual cut OD is explained by occipital location of bleed.   Impression: -ICH, etiology unknown at this point.  -history of 2011 hypertensive left parietal ICH.  Acuity: Acute Laterality:left Current  suspected etiology:  ? HTN.  Treatment: Cardene gtt, serial CTHs, MRI, CTA head and neck.  -ICH Score: 1  Plan:  -admit by neurology to 4N ICU at [facility]. Transfer patient to [facility]  -BP goal is below 140.  -Aggressive BP control with Cardene drip.  -CTH 6 hours.  -MRI tomorrow.  -CTA head and neck.  -HbA1c. Goal < 7.  -lipid panel. Start high intensity statin if LDL < 70 if there is atherosclerosis on CTA head/neck.  -CTH stat for any acute change in neuro status.  12 hours after tPA given or stat for any acute change in neurological status.  -No chemical VTE. SCDs only.  -Echocardiogram.  -Frequent neuro checks per protocol.  -NIHSS per protocol.  - Risk factor modification. -Telemetry monitoring for arrhythmia.  -PT consult, OT consult,  Speech consult. -stroke education.  - Stroke team to follow. Outpatient f/up with Dr. Leonie Man 4 weeks after discharge home.  CNS Edema is present and is not being treated or monitored - not clinically significant  -frequent neuro checks per ICU protocol.  -Follow NIH score.  Dysarthria -NPO until cleared by speech. -ST evaluation.  -Advance diet as tolerated RESP -chronic respiratory failure.  -O2 saturation per ICU protocol.  -O2 per [place] to keep O2 sats over 91%.  CV Essential (primary) hypertension. -Aggressive BP control, goal SBP < 140. -Titrate oral agents. Prophylaxis DVT: SCDs only. No antiplatelets, AC, Lovenox, NSAIDS, ASA.  GI: Protonix.  Bowel: Senna S I po bid, hold for loose stools.   Dispo:per therapy as progresses.   Diet: NPO until cleared by speech.  Code Status: Full Code    NP education: No driving until released by ophth or neurology. Informed patient that OD vision issue could be permanent, but hopefully, will improve over time. Cut wine consumption to no more than 2 glasses per day, and abstinence is even better. Close f/up with PCP for BP control. F/up neurology after discharge and ophthalmology for vision.   Patient  seen by Clance Boll, NP/Neurology and MD. Note and plan to be edited by MD as necessary.

## 2021-01-11 NOTE — ED Notes (Signed)
ED Provider at bedside for initial assessment. 

## 2021-01-11 NOTE — ED Notes (Signed)
Patient transported to CT 

## 2021-01-11 NOTE — ED Notes (Signed)
CT notified of pt needing scan stat

## 2021-01-11 NOTE — ED Notes (Signed)
Accidental Click-off of CT order. Disregard.

## 2021-01-11 NOTE — ED Provider Notes (Signed)
[facility] DEPT Provider Note   CSN: [id number] Arrival date & time: 01/11/21  1504     History Chief Complaint  Patient presents with   Dizziness   Aphasia   Vision Changes    Lee Blevins is a 75 y.o. male with PMH previous hemorrhagic CVA, HTN, HLD, asthma who presents the emergency department for evaluation of visual difficulties, dizziness and word finding difficulty.  History obtained from patient's wife who states that his symptoms began approximately 48 hours ago and she states that his speech has not been the same.  She states that during his last hemorrhagic stroke he did have persistent deficits but these have since resolved and he has returned to normal neurologic status.  Patient is not on blood thinning medications.  Denies traumatic head injury.  Patient endorsing blurry vision in the peripherals.   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   Allergic asthma, moderate persistent, uncomplicated 00/93/8182   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   Cardiomyopathy ([place]) 01/22/2016   Multiple lung nodules on CT 12/14/2015   Nonspecific abnormal electrocardiogram (ECG) (EKG) 12/14/2015   Hepatic cirrhosis ([place]) 09/24/2015   Coronary artery disease involving native coronary artery of native heart without angina pectoris 09/24/2015   Hyperglycemia 06/08/2015   Screen for colon cancer 07/08/2014   Allergy to statin medication  10/23/2012   BPH associated with nocturia 04/18/2012   Routine general medical examination at a health care facility 04/18/2012   Hyperlipidemia with target LDL less than 70 04/27/2010   Essential hypertension 04/27/2010   NASAL POLYP 11/06/2009   RHINOSINUSITIS, 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7   Smokeless tobacco: Never   Tobacco comments:    1975  Vaping Use   Vaping Use: Never used  Substance Use Topics   Alcohol use: Yes    Alcohol/week: 35.0 standard drinks    Types: 35 Glasses of wine per week   Drug use: No    Home Medications Prior to Admission medications   Medication Sig Start Date  End Date Taking? Authorizing Provider  albuterol (VENTOLIN HFA) 108 (90 Base) MCG/ACT inhaler Inhale 2 puffs into the lungs every 6 (six) hours as needed for wheezing or shortness of breath. 05/14/19   Baird Lyons D, MD  Alendronate Sodium (FOSAMAX PO) Take by mouth once a week.    [provider]  fluticasone-salmeterol (ADVAIR DISKUS) 250-50 MCG/ACT AEPB INHALE 1 INHALATION TWICE DAILY *RINSE MOUTH AFTERWARDS* 11/01/20   Baird Lyons D, MD  montelukast (SINGULAIR) 10 MG tablet TAKE 1 TABLET BY MOUTH EVERYDAY AT BEDTIME 06/04/20   Baird Lyons D, MD  olmesartan (BENICAR) 20 MG tablet TAKE 1 TABLET BY MOUTH EVERY DAY 12/14/19   Janith Lima, MD    Allergies    Amlodipine,  Crestor [rosuvastatin], Livalo [pitavastatin], and Bystolic [nebivolol hcl]  Review of Systems   Review of Systems  Constitutional:  Negative for chills and fever.  HENT:  Negative for ear pain and sore throat.   Eyes:  Positive for visual disturbance. Negative for pain.  Respiratory:  Negative for cough and shortness of breath.   Cardiovascular:  Negative for chest pain and palpitations.  Gastrointestinal:  Negative for abdominal pain and vomiting.  Genitourinary:  Negative for dysuria and hematuria.  Musculoskeletal:  Negative for arthralgias and back pain.  Skin:  Negative for color change and rash.  Neurological:  Positive for dizziness and speech difficulty. Negative for seizures and syncope.  All other systems reviewed and are negative.  Physical Exam Updated Vital Signs BP (!) 154/76 (BP Location: Left Arm)   Pulse 90   Temp 97.8 F (36.6 C) (Oral)   Resp 16   Ht 6\' 1"  (1.854 m)   Wt 77.1 kg   SpO2 97%   BMI 22.43 kg/m   Physical Exam Vitals and nursing note reviewed.  Constitutional:      Appearance: He is well-developed.  HENT:     Head: Normocephalic and atraumatic.  Eyes:     Conjunctiva/sclera: Conjunctivae normal.  Cardiovascular:     Rate and Rhythm: Normal rate and regular rhythm.     Heart sounds: No murmur heard. Pulmonary:     Effort: Pulmonary effort is normal. No respiratory distress.     Breath sounds: Normal breath sounds.  Abdominal:     Palpations: Abdomen is soft.     Tenderness: There is no abdominal tenderness.  Musculoskeletal:     Cervical back: Neck supple.  Skin:    General: Skin is warm and dry.  Neurological:     Mental Status: He is alert.     Cranial Nerves: No cranial nerve deficit.     Sensory: No sensory deficit.     Motor: No weakness.     Comments: Aphasia, word finding difficulty    ED Results / Procedures / Treatments   Labs (all labs ordered are listed, but only abnormal results are displayed) Labs Reviewed  CBC  - Abnormal; Notable for the following components:      Result Value   MCV 100.2 (*)    All other components within normal limits  DIFFERENTIAL - Abnormal; Notable for the following components:   Lymphs Abs 0.5 (*)    All other components within normal limits  I-STAT CHEM 8, ED - Abnormal; Notable for the following components:   Glucose, Bld 143 (*)    All other components within normal limits  PROTIME-INR  APTT  COMPREHENSIVE METABOLIC PANEL  RAPID URINE DRUG SCREEN, HOSP PERFORMED  URINALYSIS, ROUTINE W REFLEX MICROSCOPIC  ETHANOL    EKG None  Radiology CT HEAD WO CONTRAST  Result Date: 01/11/2021 CLINICAL DATA:  Aphasia EXAM: CT HEAD WITHOUT CONTRAST TECHNIQUE: Contiguous axial images were obtained from the base of the skull through the vertex without intravenous contrast. COMPARISON:  04/11/2010 FINDINGS: Brain: Area of hemorrhage noted in the left occipital lobe measuring 3.7 x 2.8 x 2.6 cm, volume of 13.5 mL. No mass effect or midline shift. Area of encephalomalacia within the posterior left parietal lobe in area of prior hemorrhage. There is atrophy and chronic small vessel disease changes. Vascular: No hyperdense vessel or unexpected calcification. Skull: No acute calvarial abnormality. Sinuses/Orbits: Mucosal thickening throughout the paranasal sinuses, right greater than left. No air-fluid levels. Other: None IMPRESSION: Area of acute hemorrhage within the left occipital lobe measuring up to 3.7 cm. Atrophy, chronic small vessel disease. Critical Value/emergent results were called by telephone at the time of interpretation on 01/11/2021 at 4:11 pm to provider Diamantina Edinger [facility] , who verbally acknowledged these results. Electronically Signed   By: Rolm Baptise M.D.   On: 01/11/2021 16:13    Procedures Procedures   Medications Ordered in ED Medications  labetalol (NORMODYNE) injection 20 mg (has no administration in time range)    And  nicardipine (CARDENE) 20mg  in 0.86% saline  268ml IV infusion (0.1 mg/ml) (has no administration in time range)    ED Course  I have reviewed the triage vital signs and the nursing notes.  Pertinent labs & imaging results that were available during my care of the patient were reviewed by me and considered in my medical decision making (see chart for details).    MDM Rules/Calculators/A&P                           Patient seen in the emergency department for evaluation of word finding difficulty and blurry vision in the peripherals.  Physical exam reveals no focal motor or sensory deficits but the patient does have obvious word finding difficulty.  Extraocular movements intact.  The patient is outside of the tPA window but an expedited CT head was performed that shows a left-sided occipital hemorrhagic stroke.  Patient immediately started on Cardene and labetalol pushes are available to keep blood pressure less than 140.  Neurology consulted who recommended admission to the neuro ICU at [facility].  Patient then admitted. Final Clinical Impression(s) / ED Diagnoses Final diagnoses:  None    Rx / DC Orders ED Discharge Orders     None        Sharilynn Cassity, Debe Coder, MD 01/11/21 [phone]

## 2021-01-11 NOTE — ED Notes (Addendum)
Patients wife stated symptoms started Sunday night at 2115. States peripheral vision on the right was diminished. Patient went to the eye Dr on Monday. Patient does not take any blood thinners. Patient does currently take blood pressure medication. However yesterday he took a blood pressure medication (unknown name).  Patient is alert and oriented X4. His wife is at bedside. Patient uses the urinal to void.

## 2021-01-11 NOTE — ED Notes (Addendum)
Report given to [facility] at [facility]

## 2021-01-11 NOTE — ED Notes (Signed)
[facility] is here for transport.  

## 2021-01-11 NOTE — ED Triage Notes (Signed)
Pt reports changes in vision, dizziness, nausea, aphasia x 2 days. Hx of hemorraghic stroke.

## 2021-01-12 ENCOUNTER — Inpatient Hospital Stay (HOSPITAL_COMMUNITY): Payer: Medicare Other

## 2021-01-12 ENCOUNTER — Telehealth: Payer: Self-pay | Admitting: Internal Medicine

## 2021-01-12 ENCOUNTER — Encounter (HOSPITAL_COMMUNITY): Payer: Self-pay | Admitting: Neurology

## 2021-01-12 DIAGNOSIS — I611 Nontraumatic intracerebral hemorrhage in hemisphere, cortical: Secondary | ICD-10-CM | POA: Diagnosis not present

## 2021-01-12 LAB — CBC
HCT: 40 % (ref 39.0–52.0)
Hemoglobin: 13.5 g/dL (ref 13.0–17.0)
MCH: 33.3 pg (ref 26.0–34.0)
MCHC: 33.8 g/dL (ref 30.0–36.0)
MCV: 98.8 fL (ref 80.0–100.0)
Platelets: 182 10*3/uL (ref 150–400)
RBC: 4.05 MIL/uL — ABNORMAL LOW (ref 4.22–5.81)
RDW: 12.3 % (ref 11.5–15.5)
WBC: 6 10*3/uL (ref 4.0–10.5)
nRBC: 0 % (ref 0.0–0.2)

## 2021-01-12 LAB — COMPREHENSIVE METABOLIC PANEL
ALT: 13 U/L (ref 0–44)
AST: 19 U/L (ref 15–41)
Albumin: 3.3 g/dL — ABNORMAL LOW (ref 3.5–5.0)
Alkaline Phosphatase: 52 U/L (ref 38–126)
Anion gap: 6 (ref 5–15)
BUN: 11 mg/dL (ref 8–23)
CO2: 26 mmol/L (ref 22–32)
Calcium: 9 mg/dL (ref 8.9–10.3)
Chloride: 104 mmol/L (ref 98–111)
Creatinine, Ser: 0.85 mg/dL (ref 0.61–1.24)
GFR, Estimated: >60 mL/min (ref >60)
Glucose, Bld: 109 mg/dL — ABNORMAL HIGH (ref 70–99)
Potassium: 3.6 mmol/L (ref 3.5–5.1)
Sodium: 136 mmol/L (ref 135–145)
Total Bilirubin: 1.4 mg/dL — ABNORMAL HIGH (ref 0.3–1.2)
Total Protein: 6.4 g/dL — ABNORMAL LOW (ref 6.5–8.1)

## 2021-01-12 LAB — LIPID PANEL
Cholesterol: 250 mg/dL — ABNORMAL HIGH (ref 0–200)
HDL: 112 mg/dL (ref >40)
LDL Cholesterol: 130 mg/dL — ABNORMAL HIGH (ref 0–99)
Total CHOL/HDL Ratio: 2.2 RATIO
Triglycerides: 42 mg/dL (ref <150)
VLDL: 8 mg/dL (ref 0–40)

## 2021-01-12 LAB — URINALYSIS, ROUTINE W REFLEX MICROSCOPIC
Bilirubin Urine: NEGATIVE
Glucose, UA: NEGATIVE mg/dL
Hgb urine dipstick: NEGATIVE
Ketones, ur: NEGATIVE mg/dL
Leukocytes,Ua: NEGATIVE
Nitrite: NEGATIVE
Protein, ur: NEGATIVE mg/dL
Specific Gravity, Urine: 1.012 (ref 1.005–1.030)
pH: 6 (ref 5.0–8.0)

## 2021-01-12 LAB — HEMOGLOBIN A1C
Hgb A1c MFr Bld: 5.2 % (ref 4.8–5.6)
Mean Plasma Glucose: 103 mg/dL

## 2021-01-12 LAB — SARS CORONAVIRUS 2 (TAT 6-24 HRS): SARS Coronavirus 2: NEGATIVE

## 2021-01-12 MED ORDER — IRBESARTAN 300 MG PO TABS
150.0000 mg | ORAL_TABLET | Freq: Every day | ORAL | Status: DC
  Administered 2021-01-12 – 2021-01-17 (×6): 150 mg via ORAL
  Filled 2021-01-12 (×6): qty 1

## 2021-01-12 MED ORDER — HYDRALAZINE HCL 20 MG/ML IJ SOLN: 20.0000 mg | INTRAMUSCULAR | Status: DC | PRN

## 2021-01-12 MED ORDER — IOHEXOL 350 MG/ML SOLN
75.0000 mL | Freq: Once | INTRAVENOUS | Status: AC | PRN
  Administered 2021-01-12: 75 mL via INTRAVENOUS

## 2021-01-12 MED ORDER — LABETALOL HCL 5 MG/ML IV SOLN: 20.0000 mg | INTRAVENOUS | Status: DC | PRN

## 2021-01-12 NOTE — TOC CAGE-AID Note (Signed)
Transition of Care [facility]) - CAGE-AID Screening   Patient Details  Name: Lee Blevins MRN: [id number] Date of Birth: 10-29-1945  Transition of Care [facility]) CM/SW Contact:    Daryle Amis C Tarpley-Carter, [place] Phone Number: 01/12/2021, 11:31 AM   Clinical Narrative: Pt is unable to participate in Cage Aid.   Juluis Fitzsimmons Tarpley-Carter, MSW, LCSW-A Pronouns:  She/Her/Hers [facility] Clinical Social Worker Direct Number:  [phone] [email or web address]  CAGE-AID Screening: Substance Abuse Screening unable to be completed due to: : Patient unable to participate             Substance Abuse Education Offered: No

## 2021-01-12 NOTE — Evaluation (Signed)
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   Cardiomyopathy ([place]) 01/22/2016   Multiple lung nodules on CT 12/14/2015   Nonspecific abnormal electrocardiogram (ECG) (EKG) 12/14/2015   Hepatic cirrhosis ([place]) 09/24/2015   Coronary artery disease involving native coronary artery of native heart without angina pectoris 09/24/2015   Hyperglycemia 06/08/2015   Screen for colon cancer 07/08/2014   Allergy to statin medication 10/23/2012   BPH associated with nocturia 04/18/2012   Routine general medical examination at a health care facility 04/18/2012   Hyperlipidemia with target LDL less than 70 04/27/2010   Essential hypertension 04/27/2010   NASAL POLYP 11/06/2009   RHINOSINUSITIS, 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 PMH: hemorrhagic CVA,  HTN, HLD, asthma.   Assessment / Plan / Recommendation Clinical Impression  Pt was seen for a speech language evaluation. Overall, pt presents with mild-moderate fluent aphasia impacting auditory comprehension of complex language structures and word finding/object naming. SLP informally assessed cognition, revealing memory and orientation/awareness impairment re: situation. Oral mechanism exam revealed mild facial asymmetry and reduced lingual ROM not impacting speech and was otherwise unremarkable. Pt intelligibility 100% at all utterance lengths. SLP administered the bedside WAB to formally assess language deficits. Pt exhibits impairments with following complex commands and confrontation naming, especially in the cateory of body parts (elbow, wrist). Repetition spared up to phrase level and accuracy decreases with increase in complexity. Pt spontaneous speech c/b mild circumlocution secondary to word finding difficulty and semantic paraphasias for which pt demonstrates awareness. SLP educated pt and family at bedside re: impairments commonly associated with aphasia and available SLP services across different venues. SLP will continue to follow pt for therapy targeting complex auditory comprehension, word finding strategies, and memory/orientation/awareness.    SLP Assessment  SLP Recommendation/Assessment: Patient needs continued Speech Lanaguage Pathology Services SLP Visit Diagnosis: Aphasia (R47.01);Cognitive communication deficit (R41.841)    Recommendations for follow up therapy are one component of a multi-disciplinary discharge planning process, led by the attending physician.  Recommendations may be updated based on patient status, additional functional criteria and insurance authorization.    Follow Up Recommendations  Outpatient SLP    Frequency and Duration min 2x/week  2 weeks      SLP Evaluation Cognition  Overall Cognitive Status: Impaired/Different from  baseline Arousal/Alertness: Awake/alert Orientation Level: Oriented to person;Oriented to place;Oriented to time;Disoriented to situation Attention: Sustained Sustained Attention:  Appears intact Memory: Impaired Memory Impairment: Storage deficit Awareness: Impaired Awareness Impairment: Intellectual impairment;Anticipatory impairment Executive Function: Writer: Impaired Organizing Impairment: Verbal complex Safety/Judgment: Impaired       Comprehension  Auditory Comprehension Overall Auditory Comprehension: Impaired Yes/No Questions: Within Functional Limits Commands: Impaired Complex Commands: 0-24% accurate Conversation: Simple EffectiveTechniques: Visual/Gestural cues;Stressing words Visual Recognition/Discrimination Discrimination: Not tested    Expression Expression Primary Mode of Expression: Verbal Verbal Expression Overall Verbal Expression: Impaired Initiation: No impairment Level of Generative/Spontaneous Verbalization: Conversation Repetition: Impaired Level of Impairment: Sentence level Naming: Impairment Confrontation: Impaired Other Naming Comments: body parts especially impaired for tasks assessed Verbal Errors: Semantic paraphasias;Aware of errors Pragmatics: No impairment Effective Techniques: Phonemic cues Non-Verbal Means of Communication: Not applicable Written Expression Dominant Hand: Right Written Expression: Not tested   Oral / Motor  Oral Motor/Sensory Function Overall Oral Motor/Sensory Function: Within functional limits Motor Speech Overall Motor Speech: Appears within functional limits for tasks assessed Respiration: Within functional limits Phonation: Normal Resonance: Within functional limits Articulation: Within functional limitis Intelligibility: Intelligible Motor Planning: Witnin functional limits Motor Speech Errors: Not applicable   GO           Dewitt Rota, SLP-Student         Dewitt Rota 01/12/2021,  3:17 PM

## 2021-01-12 NOTE — Evaluation (Signed)
Physical Therapy Evaluation Patient Details Name: Lee Blevins MRN: [id number] DOB: 1945-08-02 Today's Date: 01/12/2021  History of Present Illness  75 yo admitted wtih confusion, aphasia and impaired vision. 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 PMH: hemorrhagic CVA, HTN, HLD, asthma.  Clinical Impression  PTA, patient lives with wife and reports independence with driving and managing own medications/finances. Patient presents with R visual field deficits, impaired cognition, impaired balance, impaired coordination, decreased activity tolerance, and poor awareness of safety and deficits. BP initially 152/76, encouraged relaxation/deep breathing with BP 136/77. Patient requires minA for safety and balance with mobility due to patient being unaware of running into objects on R.  Patient will benefit from skilled PT services during acute stay to address listed deficits. Recommend CIR following discharge to maximize functional independence and safety prior to returning home.      Recommendations for follow up therapy are one component of a multi-disciplinary discharge planning process, led by the attending physician.  Recommendations may be updated based on patient status, additional functional criteria and insurance authorization.  Follow Up Recommendations CIR    Equipment Recommendations  None recommended by PT    Recommendations for Other Services Rehab consult     Precautions / Restrictions Precautions Precautions: Fall Precaution Comments: BP <140, R visual field deficit, R inattention Restrictions Weight Bearing Restrictions: No      Mobility  Bed Mobility Overal bed mobility: Needs Assistance Bed Mobility: Supine to Sit     Supine to sit: Supervision          Transfers Overall transfer level: Needs assistance Equipment used: None Transfers: Sit to/from  Stand Sit to Stand: Min guard         General transfer comment: min guard for safety  Ambulation/Gait Ambulation/Gait assistance: Min assist Gait Distance (Feet): 20 Feet Assistive device: None Gait Pattern/deviations: Step-through pattern;Decreased stride length Gait velocity: decreased   General Gait Details: MinA for balance and avoiding obstacles on R. Patient unaware of R visual field and bumping into objects on the R throughout  Stairs            Wheelchair Mobility    Modified Rankin (Stroke Patients Only) Modified Rankin (Stroke Patients Only) Pre-Morbid Rankin Score: No significant disability Modified Rankin: Moderately severe disability     Balance Overall balance assessment: Needs assistance Sitting-balance support: No upper extremity supported;Feet supported Sitting balance-Leahy Scale: Good     Standing balance support: No upper extremity supported;During functional activity Standing balance-Leahy Scale: Fair Standing balance comment: requires assist for safety and balance                             Pertinent Vitals/Pain Pain Assessment: No/denies pain    Home Living Family/patient expects to be discharged to:: Private residence Living Arrangements: Spouse/significant other Available Help at Discharge: Family Type of Home: House Home Access: Stairs to enter   [employer] of Steps: 2 Home Layout: One level Home Equipment: Grab bars - tub/shower;Shower seat;Cane - single point      Prior Function Level of Independence: Independent         Comments: drives, manages own medications and finances     Hand Dominance   Dominant Hand: Right    Extremity/Trunk Assessment   Upper Extremity Assessment Upper Extremity Assessment: Defer to OT evaluation    Lower Extremity Assessment Lower Extremity Assessment:  RLE deficits/detail RLE Deficits / Details: 5/5 strength, impaired coordination. Per patient, sensation  intact RLE Coordination: decreased fine motor;decreased gross motor    Cervical / Trunk Assessment Cervical / Trunk Assessment: Normal  Communication   Communication: Expressive difficulties (significant word finding deficits)  Cognition Arousal/Alertness: Awake/alert Behavior During Therapy: Anxious Overall Cognitive Status: Impaired/Different from baseline Area of Impairment: Orientation;Attention;Memory;Following commands;Safety/judgement;Awareness;Problem solving                 Orientation Level: Disoriented to;Time Current Attention Level: Sustained Memory: Decreased short-term memory Following Commands: Follows one step commands with increased time Safety/Judgement: Decreased awareness of safety;Decreased awareness of deficits Awareness: Emergent Problem Solving: Slow processing;Difficulty sequencing;Requires verbal cues;Requires tactile cues General Comments: Pt did not recall earlier  conversation with MD regarding no driving or the expaination of his vision loss. Poor awareness of deficits. R visual field deficit with no attempts at compensation      General Comments      Exercises     Assessment/Plan    PT Assessment Patient needs continued PT services  PT Problem List Decreased strength;Decreased balance;Decreased activity tolerance;Decreased mobility;Decreased coordination;Decreased cognition;Decreased knowledge of use of DME;Decreased safety awareness;Decreased knowledge of precautions       PT Treatment Interventions DME instruction;Gait training;Stair training;Functional mobility training;Therapeutic activities;Balance training;Therapeutic exercise;Neuromuscular re-education;Cognitive remediation;Patient/family education    PT Goals (Current goals can be found in the Care Plan section)  Acute Rehab PT Goals Patient Stated Goal: to go home PT Goal Formulation: With patient Time For Goal Achievement: 01/26/21 Potential to Achieve Goals: Good     Frequency Min 4X/week   Barriers to discharge        Co-evaluation PT/OT/SLP Co-Evaluation/Treatment: Yes Reason for Co-Treatment: To address functional/ADL transfers PT goals addressed during session: Mobility/safety with mobility;Balance OT goals addressed during session: ADL's and self-care       AM-PAC PT "6 Clicks" Mobility  Outcome Measure Help needed turning from your back to your side while in a flat bed without using bedrails?: A Little Help needed moving from lying on your back to sitting on the side of a flat bed without using bedrails?: A Little Help needed moving to and from a bed to a chair (including a wheelchair)?: A Little Help needed standing up from a chair using your arms (e.g., wheelchair or bedside chair)?: A Little Help needed to walk in hospital room?: A Little Help needed climbing 3-5 steps with a railing? : A Little 6 Click Score: 18    End of Session Equipment Utilized During Treatment: Gait belt Activity Tolerance: Patient tolerated treatment well Patient left: in chair;with call bell/phone within reach;with chair alarm set Nurse Communication: Mobility status PT Visit Diagnosis: Unsteadiness on feet (R26.81);Muscle weakness (generalized) (M62.81)    Time: 4403-4742 PT Time Calculation (min) (ACUTE ONLY): 26 min   Charges:   PT Evaluation $PT Eval Moderate Complexity: 1 Mod PT Treatments $Gait Training: 8-22 mins        Zyion Doxtater A. Gilford Rile PT, DPT Acute Rehabilitation Services Pager [phone] Office [phone]   Linna Hoff 01/12/2021, 4:24 PM

## 2021-01-12 NOTE — Progress Notes (Signed)
OT Cancellation Note  Patient Details Name: Lee Blevins MRN: [id number] DOB: [birth year]   Cancelled Treatment:    Reason Eval/Treat Not Completed: Active bedrest order (Will assess when medically appropriate and activity orders updated.)  [facility] [name], OT/L   Acute OT Clinical Specialist Acute Rehabilitation Services Pager [phone] Office [phone]  01/12/2021, 9:12 AM

## 2021-01-12 NOTE — Progress Notes (Signed)
OT Treatment Note  Pt seen again to assist to bathroom. Assistance required to locate the handle to flush the toilet then to locate the sink in order to complete hand hygiene. Pt requires mulitmodal cues to scan to R to find objects/items. Pt running into objects on R and required VC to find his recliner located on his R. Feel pt would benefit from intensive rehab at [facility]. Will continue to follow acutely.     01/12/21 1412  OT Visit Information  Last OT Received On 01/12/21  Assistance Needed +1  History of Present Illness 75 yo admitted wtih confusion, aphasia and impaired vision. 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 PMH: hemorrhagic CVA, HTN, HLD, asthma.  Precautions  Precautions Fall  Precaution Comments BP <140  Pain Assessment  Pain Assessment No/denies pain  Cognition  Behavior During Therapy Anxious  General Comments mod A with problem solving to find handle on toilet and to locate sink, soap and paper towels in bathroom; ran into bathroom door; cues to find recliner located on his R  ADL  Toilet Transfer Minimal assistance  Toileting- Clothing Manipulation and Hygiene Min guard  General ADL Comments mod A with problem solving to find handle on toilet and to locate sink, soap and paper towels in bathroom; ran into bathroom door; cues to find recliner located on his R; note impaired depth perception during funcitonal tasks with under/over reaching  Balance  Standing balance-Leahy Scale Fair  Vision- Assessment  Additional Comments Began educating pt on scanning adn using anchors to help increase safety with mobility; pt does not appear to demonstrate insight at this time regarding impact of visual deficits on function and safety  Transfers  Sit to Stand Min guard  OT - End of Session  Activity Tolerance Patient tolerated treatment well  Patient left in chair;with call  bell/phone within reach;with chair alarm set  Nurse Communication Mobility status  OT Assessment/Plan  OT Plan Discharge plan remains appropriate  OT Visit Diagnosis Other abnormalities of gait and mobility (R26.89);Low vision, both eyes (H54.2);Other symptoms and signs involving cognitive function  OT Frequency (ACUTE ONLY) Min 2X/week  Recommendations for Other Services Rehab consult  Follow Up Recommendations CIR  OT Equipment None recommended by OT  AM-PAC OT "6 Clicks" Daily Activity Outcome Measure (Version 2)  Help from another person eating meals? 3  Help from another person taking care of personal grooming? 3  Help from another person toileting, which includes using toliet, bedpan, or urinal? 2  Help from another person bathing (including washing, rinsing, drying)? 3  Help from another person to put on and taking off regular upper body clothing? 3  Help from another person to put on and taking off regular lower body clothing? 3  6 Click Score 17  Progressive Mobility  What is the highest level of mobility based on the progressive mobility assessment? Level 3 (Stands with assist) - Balance while standing  and cannot march in place  Mobility Out of bed for toileting;Out of bed to chair with meals  OT Goal Progression  Progress towards OT goals Progressing toward goals  Acute Rehab OT Goals  Patient Stated Goal to go home  OT Goal Formulation With patient  Time For Goal Achievement 01/26/21  Potential to Achieve Goals Good  ADL Goals  Pt Will Perform Lower Body Bathing with modified independence;sit to/from stand  Pt Will Perform Lower Body Dressing  with modified independence;sit to/from stand  Pt Will Transfer to Toilet with modified independence;ambulating  Additional ADL Goal #1 Pt will find 5/10 targets in central visual field with min vc  Additional ADL Goal #2 Pt will demonstrate anchoring techniques with min VC when scanning environment to reduce risk of falls during  functional mobility  OT Time Calculation  OT Start Time (ACUTE ONLY) 1215  OT Stop Time (ACUTE ONLY) 1227  OT Time Calculation (min) 12 min  OT General Charges  $OT Visit 1 Visit  OT Treatments  $Self Care/Home Management  8-22 mins  Maurie Boettcher, OT/L   Acute OT Clinical Specialist Acute Rehabilitation Services Pager [phone] Office [phone]

## 2021-01-12 NOTE — Telephone Encounter (Signed)
[facility] FYI...   Caller states her H noticed some peripheral vision was off and was seen at the eye Dr yesterday and cataracs were apparently the cause of this. Caller states her H balance is off as well as his depth perception. He is not confused but has a headache and neckache.   Advised to go to ED now.   Patient understood and went to [facility] ED

## 2021-01-12 NOTE — Progress Notes (Signed)
STROKE TEAM PROGRESS NOTE   INTERVAL HISTORY Wife at bedside earlier while patient was in radiology but not when patient was seen after returning to room.  Still having difficulty speaking and right sided visual deficit. Off drips.  We discussed his history, stroke diagnosis, ongoing work up and plan of care with wife and then patient when he returned to room.  Blood pressure adequately controlled.  Neurological exam is unchanged.  CT scan and MRI of shows stable appearance of left parieto-occipital parenchymal hemorrhage.  He also had a previous posterior parietal hemorrhage in 2011 which was felt to be hypertensive Vitals:   01/12/21 0645 01/12/21 0700 01/12/21 0758 01/12/21 0800  BP: 121/66 116/66  110/63  Pulse: 72 67  66  Resp: 15 19  20   Temp:   98 F (36.7 C)   TempSrc:   Oral   SpO2: 92% 93%  92%  Weight:      Height:       CBC:  Recent Labs  Lab 01/11/21 1552 01/11/21 1601 01/12/21 0100  WBC 5.9  --  6.0  NEUTROABS 4.8  --   --   HGB 14.6 15.3 13.5  HCT 44.1 45.0 40.0  MCV 100.2*  --  98.8  PLT 193  --  622   Basic Metabolic Panel:  Recent Labs  Lab 01/11/21 1552 01/11/21 1601 01/12/21 0100  NA 143 135 136  K 4.5 4.3 3.6  CL 107 102 104  CO2 28  --  26  GLUCOSE 146* 143* 109*  BUN 17 14 11   CREATININE 0.90 0.80 0.85  CALCIUM 10.0  --  9.0   Lipid Panel:  Recent Labs  Lab 01/12/21 0100  CHOL 250*  TRIG 42  HDL 112  CHOLHDL 2.2  VLDL 8  LDLCALC 130*   HgbA1c: No results for input(s): HGBA1C in the last 168 hours. Urine Drug Screen:  Recent Labs  Lab 01/11/21 1656  LABOPIA NONE DETECTED  COCAINSCRNUR NONE DETECTED  LABBENZ NONE DETECTED  AMPHETMU NONE DETECTED  THCU NONE DETECTED  LABBARB NONE DETECTED    Alcohol Level  Recent Labs  Lab 01/11/21 1552  ETH <10    IMAGING past 24 hours CT HEAD WO CONTRAST  Result Date: 01/11/2021 CLINICAL DATA:  Intracranial hemorrhage follow-up EXAM: CT HEAD WITHOUT CONTRAST TECHNIQUE: Contiguous  axial images were obtained from the base of the skull through the vertex without intravenous contrast. COMPARISON:  None. FINDINGS: Brain: Unchanged appearance of left occipital intraparenchymal hematoma with mild surrounding edema. There is periventricular hypoattenuation compatible with chronic microvascular disease. Unchanged appearance of multiple old infarcts. Generalized volume loss. Vascular: No abnormal hyperdensity of the major intracranial arteries or dural venous sinuses. No intracranial atherosclerosis. Skull: The visualized skull base, calvarium and extracranial soft tissues are normal. Sinuses/Orbits: Small amount of left mastoid fluid. Opacification of the right maxillary sinus and bilateral anterior ethmoid air cells. Partial opacification of the right frontal sinus. The orbits are normal. IMPRESSION: 1. Unchanged appearance of left occipital intraparenchymal hematoma with mild surrounding edema. 2. Unchanged appearance of multiple old infarcts and chronic microvascular ischemia. 3. Paranasal sinus disease. Electronically Signed   By: Ulyses Jarred M.D.   On: 01/11/2021 21:54   CT HEAD WO CONTRAST  Result Date: 01/11/2021 CLINICAL DATA:  Aphasia EXAM: CT HEAD WITHOUT CONTRAST TECHNIQUE: Contiguous axial images were obtained from the base of the skull through the vertex without intravenous contrast. COMPARISON:  04/11/2010 FINDINGS: Brain: Area of hemorrhage noted in the left occipital  lobe measuring 3.7 x 2.8 x 2.6 cm, volume of 13.5 mL. No mass effect or midline shift. Area of encephalomalacia within the posterior left parietal lobe in area of prior hemorrhage. There is atrophy and chronic small vessel disease changes. Vascular: No hyperdense vessel or unexpected calcification. Skull: No acute calvarial abnormality. Sinuses/Orbits: Mucosal thickening throughout the paranasal sinuses, right greater than left. No air-fluid levels. Other: None IMPRESSION: Area of acute hemorrhage within the left  occipital lobe measuring up to 3.7 cm. Atrophy, chronic small vessel disease. Critical Value/emergent results were called by telephone at the time of interpretation on 01/11/2021 at 4:11 pm to provider [facility] , who verbally acknowledged these results. Electronically Signed   By: Rolm Baptise M.D.   On: 01/11/2021 16:13   MR BRAIN W WO CONTRAST  Result Date: 01/12/2021 CLINICAL DATA:  Intracranial hemorrhage follow up EXAM: MRI HEAD WITHOUT AND WITH CONTRAST TECHNIQUE: Multiplanar, multiecho pulse sequences of the brain and surrounding structures were obtained without and with intravenous contrast. CONTRAST:  2mL GADAVIST GADOBUTROL 1 MMOL/ML IV SOLN COMPARISON:  Head CT 01/11/2021 FINDINGS: Brain: Small area of abnormal diffusion restriction in the left centrum semiovale. Large left occipital intraparenchymal hematoma is unchanged allowing for differences in modality. Evidence of chronic hemorrhage over the posterior left parietal lobe. Hyperintense T2-weighted signal is moderately widespread throughout the white matter. Generalized volume loss without a clear lobar predilection. The midline structures are normal. There is no abnormal contrast enhancement. Vascular: Major flow voids are preserved. Skull and upper cervical spine: Normal calvarium and skull base. Visualized upper cervical spine and soft tissues are normal. Sinuses/Orbits:Bilateral mastoid fluid with right maxillary and bilateral ethmoid sinus mucosal thickening. Normal orbits. IMPRESSION: 1. Unchanged size of left occipital intraparenchymal hematoma allowing for differences in modality. 2. Small area of acute ischemia in the left centrum semiovale. 3. Chronic microvascular ischemia and volume loss. Electronically Signed   By: Ulyses Jarred M.D.   On: 01/12/2021 00:01    PHYSICAL EXAM Pleasant elderly Caucasian male not in distress.  He appears anxious. and fidgety. . Afebrile. Head is nontraumatic. Neck is supple without bruit.     Cardiac exam no murmur or gallop. Lungs are clear to auscultation. Distal pulses are well felt.  Neurological Exam ;  Awake  Alert moderate expressive aphasia with significant nonfluent speech and word finding difficulties and paraphasic errors.  Comprehension seems better preserved.  Difficulty with naming repetition.. Normal speech and language.eye movements full without nystagmus.fundi were not visualized. Vision acuity  appears normal.  Dense right homonymous hemianopsia.  Hearing is normal. Palatal movements are normal. Face symmetric. Tongue midline. Normal strength, tone, reflexes and coordination. Normal sensation. Gait deferred.  ASSESSMENT/PLAN Mr. Olof Marcil is a 75 y.o. male with PMH significant for hemorrhagic CVA, HTN, HLD, asthma admitted wtih confusion, aphasia and impaired vision. 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 Left occipital intraparenchymal hematoma which may be due to amyloid angiopathy or hypertension. Small area of acute ischemia in the left centrum semiovale likely from small vessel disease  Code Stroke CT head  Area of acute hemorrhage within the left occipital lobe measuring up to 3.7 cm. Atrophy, chronic small vessel disease Repeat CT head Unchanged appearance of left occipital intraparenchymal hematoma with mild surrounding edema. Unchanged appearance of multiple old infarcts and chronic microvascular ischemia. CTA head & neck  Left occipital hematoma unchanged in size. Small adjacent subarachnoid hemorrhage  unchanged.No underlying vascular malformation on CTA No significant carotid or vertebral artery stenosis in the neck MRI  Unchanged size of left occipital intraparenchymal hematoma allowing for differences in modality. Small area of acute ischemia in the left centrum semiovale. Chronic microvascular ischemia and volume loss.   2D Echo  PENDING LDL 130 HgbA1c 5.6 VTE prophylaxis - SCDs Therapy recommendations:  CIR Disposition:  TBD  Hypertension Home meds:  Benicar BP moderately high on admission in 150s Stable on cardene drip, attempts to wean thus far unsuccessful BP systolic goal less than 283 for first 2 hours then less than 160 Continue prn hydralazine, labetolol (discussed bystolic listed allergy with pharmacist, deemed low risk)  Long-term BP goal normotensive  Hyperlipidemia Home meds:  None LDL 130, not at goal < 70 High intensity statin on hold in setting of ICH, to be considered at discharge (Lipitor 40mg )         Nutrition On regular diet  Other Stroke Risk Factors Advanced Age >/= 60  Former Cigarette smoker, quit 40 years ago  Hx of ICH  Current ETOH use of 3 glasses of wine per night   Other [facility] day # 1  This plan of care was directed by Dr. Leonie Man.   STROKE MD NOTE :  I have personally obtained history,examined this patient, reviewed notes, independently viewed imaging studies, participated in medical decision making and plan of care.ROS completed by me personally and pertinent positives fully documented  I have made any additions or clarifications directly to the above note. Agree with note above.  Patient presented with sudden onset of aphasia and speech difficulties due to left.  Occipital parenchymal hemorrhage etiology indeterminate possibly amyloid angiopathy versus hypertension.  Recommend strict blood pressure control with systolic goal below 662 for the first 24 hours and then below 160.  Wean nicardipine drip and use as needed IV labetalol and hydralazine and if patient is able to swallow stop home oral medications.  Long discussion with patient and wife and answered questions.This patient is critically ill and at significant risk of neurological worsening, death and care requires constant monitoring of vital signs, hemodynamics,respiratory and cardiac monitoring,  extensive review of multiple databases, frequent neurological assessment, discussion with family, other specialists and medical decision making of high complexity.I have made any additions or clarifications directly to the above note.This critical care time does not reflect procedure time, or teaching time or supervisory time of PA/NP/Med Resident etc but could involve care discussion time.  I spent 30 minutes of neurocritical care time  in the care of  this patient.      Antony Contras, MD Medical Director [facility] Pager: [phone] 01/12/2021 4:06 PM  To contact Stroke Continuity provider, please refer to [email or web address] After hours, contact General Neurology

## 2021-01-12 NOTE — Progress Notes (Signed)
PT Cancellation Note  Patient Details Name: Lee Blevins MRN: [id number] DOB: 1946-04-15   Cancelled Treatment:    Reason Eval/Treat Not Completed: Active bedrest order PT will re-attempt once activity orders are increased.   Vinay Ertl A. Gilford Rile PT, DPT Acute Rehabilitation Services Pager [phone] Office [phone]    Linna Hoff 01/12/2021, 8:00 AM

## 2021-01-12 NOTE — Evaluation (Addendum)
Occupational Therapy Evaluation Patient Details Name: Lee Blevins MRN: [id number] DOB: 1945/06/20 Today's Date: 01/12/2021   History of Present Illness 75 yo admitted wtih confusion, aphasia and impaired vision. 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 PMH: hemorrhagic CVA, HTN, HLD, asthma.   Clinical Impression   PTA pt lives independently with his wife, including driving and managing his own finances/medications. Pt presents with significant deficits with vision, spatial awareness and cognition as indicated below. Requires min A and Mod VC to copmlete tasks due to impairments. Pt unaware of running into objects on R and did not recall earlier conversation with MD/NP regarding his vision loss. Anxious at times.  At this time recommend CIR to facilitate safe DC home. Will follow acutely.  BP 152/76 bed level; encouraged relaxation /deep breathing - 136/77 prior to mobility     Recommendations for follow up therapy are one component of a multi-disciplinary discharge planning process, led by the attending physician.  Recommendations may be updated based on patient status, additional functional criteria and insurance authorization.   Follow Up Recommendations  CIR  Refrain from Driving; direct assistance with all medication/financial management  Equipment Recommendations       Recommendations for Other Services Rehab consult     Precautions / Restrictions Precautions Precautions: Fall; significant R field cut Precaution Comments: BP <140      Mobility Bed Mobility Overal bed mobility: Needs Assistance Bed Mobility: Supine to Sit     Supine to sit: Supervision          Transfers Overall transfer level: Needs assistance   Transfers: Sit to/from Stand Sit to Stand: Min guard              Balance Overall balance assessment: Needs assistance   Sitting  balance-Leahy Scale: Good       Standing balance-Leahy Scale: Fair                             ADL either performed or assessed with clinical judgement   ADL Overall ADL's : Needs assistance/impaired Eating/Feeding: Set up;Supervision/ safety Eating/Feeding Details (indicate cue type and reason): VC to find all items on tray; difficulty safely placing items on tray; apparent poor depth perception Grooming: Minimal assistance Grooming Details (indicate cue type and reason): mod vc to locate sopa/papertowels, the sink Upper Body Bathing: Set up;Supervision/ safety;Sitting   Lower Body Bathing: Min guard;Sit to/from stand   Upper Body Dressing : Set up;Supervision/safety   Lower Body Dressing: Min guard;Sit to/from stand Lower Body Dressing Details (indicate cue type and reason): difficulty with coordination noted Toilet Transfer: Minimal assistance Toilet Transfer Details (indicate cue type and reason): Difficulty with orientation in bathrooml unable to find handle to flush toilet or sink Toileting- Clothing Manipulation and Hygiene: Min guard       Functional mobility during ADLs: Minimal assistance General ADL Comments: Pt not aware of bumping into objects in R field     Vision Baseline Vision/History: 1 Wears glasses Patient Visual Report: Peripheral vision impairment Vision Assessment?: Yes Eye Alignment: Within Functional Limits Ocular Range of Motion: Within Functional Limits Alignment/Gaze Preference: Within Defined Limits Tracking/Visual Pursuits: Decreased smoothness of horizontal tracking;Decreased smoothness of vertical tracking Saccades: Decreased speed of saccadic movement;Impaired - to be further tested in functional context Visual Fields: Right visual field deficit Depth Perception: Overshoots;Undershoots Additional Comments: Appears central vision is affected;  difficulty with reading     Perception Perception Spatial deficits: inattention to R    Praxis      Pertinent Vitals/Pain Pain Assessment: No/denies pain     Hand Dominance Right   Extremity/Trunk Assessment Upper Extremity Assessment Upper Extremity Assessment: LUE deficits/detail (mold coordination deficits - will further assess)   Lower Extremity Assessment Lower Extremity Assessment: Defer to PT evaluation   Cervical / Trunk Assessment Cervical / Trunk Assessment: Normal   Communication Communication Communication: Expressive difficulties (significatn word finding deficits)   Cognition Arousal/Alertness: Awake/alert Behavior During Therapy: Anxious Overall Cognitive Status: Difficult to assess Area of Impairment: Orientation;Attention;Memory;Following commands;Safety/judgement;Awareness;Problem solving                 Orientation Level: Disoriented to;Time Current Attention Level: Sustained Memory: Decreased short-term memory Following Commands: Follows one step commands with increased time Safety/Judgement: Decreased awareness of safety;Decreased awareness of deficits Awareness: Emergent Problem Solving: Slow processing General Comments: Pt did not recall earlier  conversation with MD regarding no driving or the expaination of his vision loss.   General Comments       Exercises     Shoulder Instructions      Home Living Family/patient expects to be discharged to:: Private residence Living Arrangements: Spouse/significant other Available Help at Discharge: Family Type of Home: House Home Access: Stairs to enter Technical brewer of Steps: 2   [place]: One level     Bathroom Shower/Tub: Occupational psychologist: Standard Bathroom Accessibility: Yes   Home Equipment: Grab bars - tub/shower;Shower seat;Cane - single point          Prior Functioning/Environment Level of Independence: Independent        Comments: drives, manages own medications and finances        OT Problem List: Impaired  vision/perception;Decreased cognition;Decreased safety awareness;Decreased knowledge of use of DME or AE      OT Treatment/Interventions: Self-care/ADL training;Therapeutic exercise;Neuromuscular education;DME and/or AE instruction;Therapeutic activities;Cognitive remediation/compensation;Visual/perceptual remediation/compensation;Patient/family education;Balance training    OT Goals(Current goals can be found in the care plan section) Acute Rehab OT Goals Patient Stated Goal: to go home OT Goal Formulation: With patient Time For Goal Achievement: 01/26/21 Potential to Achieve Goals: Good  OT Frequency: Min 2X/week   Barriers to D/C:            Co-evaluation PT/OT/SLP Co-Evaluation/Treatment: Yes Reason for Co-Treatment: To address functional/ADL transfers   OT goals addressed during session: ADL's and self-care      AM-PAC OT "6 Clicks" Daily Activity     Outcome Measure Help from another person eating meals?: A Little Help from another person taking care of personal grooming?: A Little Help from another person toileting, which includes using toliet, bedpan, or urinal?: A Little Help from another person bathing (including washing, rinsing, drying)?: A Little Help from another person to put on and taking off regular upper body clothing?: A Little Help from another person to put on and taking off regular lower body clothing?: A Little 6 Click Score: 18   End of Session Equipment Utilized During Treatment: Gait belt Nurse Communication: Mobility status (DC recommendations)  Activity Tolerance: Patient tolerated treatment well Patient left: in chair;with call bell/phone within reach;with chair alarm set  OT Visit Diagnosis: Other abnormalities of gait and mobility (R26.89);Low vision, both eyes (H54.2);Other symptoms and signs involving cognitive function                Time: 0177-9390 OT Time Calculation (min): 27 min Charges:  OT General Charges $OT Visit: 1 Visit OT  Evaluation $OT Eval Moderate Complexity: [place], OT/L   Acute OT Clinical Specialist Acute Rehabilitation Services Pager [phone] Office [phone]   [facility] 01/12/2021, 2:08 PM

## 2021-01-13 ENCOUNTER — Inpatient Hospital Stay (HOSPITAL_COMMUNITY): Payer: Medicare Other

## 2021-01-13 DIAGNOSIS — I619 Nontraumatic intracerebral hemorrhage, unspecified: Secondary | ICD-10-CM | POA: Diagnosis not present

## 2021-01-13 DIAGNOSIS — I6389 Other cerebral infarction: Secondary | ICD-10-CM | POA: Diagnosis not present

## 2021-01-13 LAB — ECHOCARDIOGRAM COMPLETE
AR max vel: 0.93 cm2
AV Area VTI: 0.93 cm2
AV Area mean vel: 0.92 cm2
AV Mean grad: 15 mmHg
AV Peak grad: 26.4 mmHg
Ao pk vel: 2.57 m/s
Area-P 1/2: 2.91 cm2
Height: 73 in
S' Lateral: 3.2 cm
Weight: 2720 oz

## 2021-01-13 MED ORDER — LABETALOL HCL 5 MG/ML IV SOLN: 10.0000 mg | INTRAVENOUS | Status: DC | PRN

## 2021-01-13 NOTE — Progress Notes (Signed)
STROKE TEAM PROGRESS NOTE   INTERVAL HISTORY No acute events, remains off continuous infusions. BP stable. Afebrile. Wife and son at bedside.  We again discussed his history, stroke diagnosis, ongoing work up and plan of care including transfer out of ICU, CIR and medications. All were questions were answered.   Vitals:   01/13/21 0400 01/13/21 0500 01/13/21 0600 01/13/21 0800  BP: 135/66 121/63 120/62 (!) 142/79  Pulse: 63 (!) 59 61 74  Resp: 13 15 15 17   Temp: 98.3 F (36.8 C)     TempSrc: Oral     SpO2: (!) 89% 96% 93% 93%  Weight:      Height:       CBC:  Recent Labs  Lab 01/11/21 1552 01/11/21 1601 01/12/21 0100  WBC 5.9  --  6.0  NEUTROABS 4.8  --   --   HGB 14.6 15.3 13.5  HCT 44.1 45.0 40.0  MCV 100.2*  --  98.8  PLT 193  --  767   Basic Metabolic Panel:  Recent Labs  Lab 01/11/21 1552 01/11/21 1601 01/12/21 0100  NA 143 135 136  K 4.5 4.3 3.6  CL 107 102 104  CO2 28  --  26  GLUCOSE 146* 143* 109*  BUN 17 14 11   CREATININE 0.90 0.80 0.85  CALCIUM 10.0  --  9.0   Lipid Panel:  Recent Labs  Lab 01/12/21 0100  CHOL 250*  TRIG 42  HDL 112  CHOLHDL 2.2  VLDL 8  LDLCALC 130*   HgbA1c:  Recent Labs  Lab 01/12/21 0100  HGBA1C 5.2   Urine Drug Screen:  Recent Labs  Lab 01/11/21 1656  LABOPIA NONE DETECTED  COCAINSCRNUR NONE DETECTED  LABBENZ NONE DETECTED  AMPHETMU NONE DETECTED  THCU NONE DETECTED  LABBARB NONE DETECTED    Alcohol Level  Recent Labs  Lab 01/11/21 1552  ETH <10    IMAGING past 24 hours CT ANGIO HEAD W OR WO CONTRAST  Result Date: 01/12/2021 : CLINICAL DATA:   Intracranial hemorrhage EXAM: CT ANGIOGRAPHY HEAD AND NECK TECHNIQUE: Multidetector CT imaging of the head and neck was performed using the standard protocol during bolus administration of intravenous contrast. Multiplanar CT image reconstructions and MIPs were obtained to evaluate the vascular anatomy. Carotid stenosis measurements (when applicable) are  obtained utilizing NASCET criteria, using the distal internal carotid diameter as the denominator. CONTRAST:   74mL OMNIPAQUE IOHEXOL 350 MG/ML SOLN COMPARISON:   MRI head and CT head 01/11/2021 FINDINGS: CT HEAD FINDINGS Brain: Left occipital high-density hematoma unchanged in size measuring approximately 3.7 x 2.4 cm. Mild surrounding edema. Mild adjacent subarachnoid hemorrhage is unchanged. Ventricle size normal. No midline shift Moderate white matter hypodensity diffusely is chronic and unchanged from prior studies. This is most likely due to chronic microvascular ischemia. No acute ischemic infarct or mass Vascular: Negative for hyperdense vessel Skull: Negative Sinuses: Mucosal edema throughout the paranasal sinuses. Air-fluid level right frontal sinus and sphenoid sinus. Complete opacification right maxillary sinus. Bony thickening of the maxillary sinus bilaterally in the sphenoid sinus. Mastoid sinus clear on the right with mild left mastoid effusion. Orbits: Negative Review of the MIP images confirms the above findings CTA NECK FINDINGS Aortic arch: Standard branching. Imaged portion shows no evidence of aneurysm or dissection. No significant stenosis of the major arch vessel origins. Atherosclerotic calcification in the aortic arch and proximal left subclavian artery Right carotid system: Mild atherosclerotic disease right carotid bifurcation. No right carotid stenosis or dissection Left  carotid system: Mild atherosclerotic calcification left carotid bifurcation. Negative for stenosis or dissection Vertebral arteries: Right vertebral artery dominant and widely patent to the basilar. Small left vertebral artery with small contribution to the basilar. Skeleton: Cervical spondylosis without acute abnormality. Other neck: Negative for mass or adenopathy in the neck Upper chest: Mild apical scarring bilaterally. Remaining lung apices clear. Review of the MIP images confirms the above findings CTA HEAD  FINDINGS Anterior circulation: Atherosclerotic calcification in the cavernous carotid on the right with mild stenosis of the supraclinoid segment. Mild atherosclerotic disease left cavernous carotid without stenosis. Anterior and middle cerebral arteries widely patent. No stenosis, large vessel occlusion, or vascular malformation Posterior circulation: Right vertebral artery is widely patent with mild atherosclerotic distally. Right vertebral artery is dominant. Small left vertebral artery. Basilar is widely patent. Superior cerebellar and posterior cerebral arteries are patent. No large vessel occlusion. Negative for vascular malformation related to the left occipital hematoma. Venous sinuses: Normal vascular enhancement Anatomic variants: None Review of the MIP images confirms the above findings IMPRESSION: 1. Left occipital hematoma unchanged in size. Small adjacent subarachnoid hemorrhage unchanged. Differential diagnosis includes hypertensive hemorrhage versus cerebral amyloid. No underlying vascular malformation on CTA 2. No significant carotid or vertebral artery stenosis in the neck. Electronically Signed By: Franchot Gallo M.D. On: 01/12/2021 13:48 Electronically Signed   By: Franchot Gallo M.D.   On: 01/12/2021 14:09   CT ANGIO NECK W OR WO CONTRAST  Result Date: 01/12/2021 CLINICAL DATA:  Intracranial hemorrhage EXAM: CT ANGIOGRAPHY HEAD AND NECK TECHNIQUE: Multidetector CT imaging of the head and neck was performed using the standard protocol during bolus administration of intravenous contrast. Multiplanar CT image reconstructions and MIPs were obtained to evaluate the vascular anatomy. Carotid stenosis measurements (when applicable) are obtained utilizing NASCET criteria, using the distal internal carotid diameter as the denominator. CONTRAST:  30mL OMNIPAQUE IOHEXOL 350 MG/ML SOLN COMPARISON:  MRI head and CT head 01/11/2021 FINDINGS: CT HEAD FINDINGS Brain: Left occipital high-density hematoma  unchanged in size measuring approximately 3.7 x 2.4 cm. Mild surrounding edema. Mild adjacent subarachnoid hemorrhage is unchanged. Ventricle size normal. No midline shift Moderate white matter hypodensity diffusely is chronic and unchanged from prior studies. This is most likely due to chronic microvascular ischemia. No acute ischemic infarct or mass Vascular: Negative for hyperdense vessel Skull: Negative Sinuses: Mucosal edema throughout the paranasal sinuses. Air-fluid level right frontal sinus and sphenoid sinus. Complete opacification right maxillary sinus. Bony thickening of the maxillary sinus bilaterally in the sphenoid sinus. Mastoid sinus clear on the right with mild left mastoid effusion. Orbits: Negative Review of the MIP images confirms the above findings CTA NECK FINDINGS Aortic arch: Standard branching. Imaged portion shows no evidence of aneurysm or dissection. No significant stenosis of the major arch vessel origins. Atherosclerotic calcification in the aortic arch and proximal left subclavian artery Right carotid system: Mild atherosclerotic disease right carotid bifurcation. No right carotid stenosis or dissection Left carotid system: Mild atherosclerotic calcification left carotid bifurcation. Negative for stenosis or dissection Vertebral arteries: Right vertebral artery dominant and widely patent to the basilar. Small left vertebral artery with small contribution to the basilar. Skeleton: Cervical spondylosis without acute abnormality. Other neck: Negative for mass or adenopathy in the neck Upper chest: Mild apical scarring bilaterally. Remaining lung apices clear. Review of the MIP images confirms the above findings CTA HEAD FINDINGS Anterior circulation: Atherosclerotic calcification in the cavernous carotid on the right with mild stenosis of the supraclinoid segment. Mild atherosclerotic  disease left cavernous carotid without stenosis. Anterior and middle cerebral arteries widely patent. No  stenosis, large vessel occlusion, or vascular malformation Posterior circulation: Right vertebral artery is widely patent with mild atherosclerotic distally. Right vertebral artery is dominant. Small left vertebral artery. Basilar is widely patent. Superior cerebellar and posterior cerebral arteries are patent. No large vessel occlusion. Negative for vascular malformation related to the left occipital hematoma. Venous sinuses: Normal vascular enhancement Anatomic variants: None Review of the MIP images confirms the above findings IMPRESSION: 1. Left occipital hematoma unchanged in size. Small adjacent subarachnoid hemorrhage unchanged. Differential diagnosis includes hypertensive hemorrhage versus cerebral amyloid. No underlying vascular malformation on CTA 2. No significant carotid or vertebral artery stenosis in the neck. Electronically Signed   By: Franchot Gallo M.D.   On: 01/12/2021 13:48    PHYSICAL EXAM Pleasant elderly Caucasian male not in distress.  He appears anxious. and fidgety. . Afebrile. Head is nontraumatic. Neck is supple without bruit.    Cardiac exam no murmur or gallop. Lungs are clear to auscultation. Distal pulses are well felt.  Neurological Exam ;  Awake  Alert moderate expressive aphasia with significant nonfluent speech and word finding difficulties and paraphasic errors.  Comprehension seems better preserved.  Difficulty with naming repetition.. Normal speech and language.eye movements full without nystagmus.fundi were not visualized. Vision acuity  appears normal.  Dense right homonymous hemianopsia.  Hearing is normal. Palatal movements are normal. Face symmetric. Tongue midline. Normal strength, tone, reflexes and coordination. Normal sensation. Gait deferred.  ASSESSMENT/PLAN Lee Blevins is a 75 y.o. male with PMH significant for hemorrhagic CVA, HTN, HLD, asthma admitted wtih confusion, aphasia and impaired vision. MRI + L occipital intraparenchymal hematoma, small  area of acutre ischemia L centrum ovale; chronic hemorrhage over the posterior L parietal lobe.  Left occipital intraparenchymal hematoma which may be due to amyloid angiopathy or hypertension. Small area of acute ischemia in the left centrum semiovale likely from small vessel disease  Code Stroke CT head  Area of acute hemorrhage within the left occipital lobe measuring up to 3.7 cm. Atrophy, chronic small vessel disease Repeat CT head Unchanged appearance of left occipital intraparenchymal hematoma with mild surrounding edema. Unchanged appearance of multiple old infarcts and chronic microvascular ischemia. CTA head & neck  Left occipital hematoma unchanged in size. Small adjacent subarachnoid hemorrhage unchanged.No underlying vascular malformation on CTA No significant carotid or vertebral artery stenosis in the neck MRI  Unchanged size of left occipital intraparenchymal hematoma allowing for differences in modality. Small area of acute ischemia in the left centrum semiovale. Chronic microvascular ischemia and volume loss.   2D Echo LDL 130 HgbA1c 5.6 VTE prophylaxis - SCDs Not on anticoagulant or antiplatelets prior to admission NO ANTIPLATELET OR ANTICOAGULANTS Obtain stat head CT for any neurologic decline  Therapy recommendations:  CIR Disposition:  CIR Plan to transfer out of ICU today and to hospitalist team per protocol.   Hypertension Home meds:  Benicar, Avapro substituted here.  BP moderately high on admission in 150s, off cardene infusion BP systolic goal less than 338 Continue prn hydralazine, labetolol (discussed bystolic listed allergy with pharmacist, deemed low risk)  Long-term BP goal normotensive  Hyperlipidemia Home meds:  None LDL 130, not at goal < 70 High intensity statin on hold in setting of ICH, to be considered at discharge (Lipitor 40mg )         Nutrition On regular diet, tolerating well  Other Stroke Risk Factors Advanced Age >/= 60  Former Cigarette smoker, quit 40 years ago  Hx of ICH  Current ETOH use of 3 glasses of wine per night   Other [facility] day # 2  This plan of care was directed by Dr. Leonie Man.  Charlene Brooke, NP-C    To contact Stroke Continuity provider, please refer to [email or web address] After hours, contact General Neurology

## 2021-01-13 NOTE — Progress Notes (Signed)
STROKE TEAM PROGRESS NOTE   INTERVAL HISTORY No acute events, remains off continuous infusions. BP stable. Afebrile. Wife and son at bedside.  We again discussed his history, stroke diagnosis, ongoing work up and plan of care including transfer out of ICU, CIR and medications. All were questions were answered.  LDL cholesterol is 130 mg percent.  Hemoglobin A1c is 5.2. Vitals:   01/13/21 0400 01/13/21 0500 01/13/21 0600 01/13/21 0800  BP: 135/66 121/63 120/62 (!) 142/79  Pulse: 63 (!) 59 61 74  Resp: 13 15 15 17   Temp: 98.3 F (36.8 C)     TempSrc: Oral     SpO2: (!) 89% 96% 93% 93%  Weight:      Height:       CBC:  Recent Labs  Lab 01/11/21 1552 01/11/21 1601 01/12/21 0100  WBC 5.9  --  6.0  NEUTROABS 4.8  --   --   HGB 14.6 15.3 13.5  HCT 44.1 45.0 40.0  MCV 100.2*  --  98.8  PLT 193  --  536   Basic Metabolic Panel:  Recent Labs  Lab 01/11/21 1552 01/11/21 1601 01/12/21 0100  NA 143 135 136  K 4.5 4.3 3.6  CL 107 102 104  CO2 28  --  26  GLUCOSE 146* 143* 109*  BUN 17 14 11   CREATININE 0.90 0.80 0.85  CALCIUM 10.0  --  9.0   Lipid Panel:  Recent Labs  Lab 01/12/21 0100  CHOL 250*  TRIG 42  HDL 112  CHOLHDL 2.2  VLDL 8  LDLCALC 130*   HgbA1c:  Recent Labs  Lab 01/12/21 0100  HGBA1C 5.2   Urine Drug Screen:  Recent Labs  Lab 01/11/21 1656  LABOPIA NONE DETECTED  COCAINSCRNUR NONE DETECTED  LABBENZ NONE DETECTED  AMPHETMU NONE DETECTED  THCU NONE DETECTED  LABBARB NONE DETECTED    Alcohol Level  Recent Labs  Lab 01/11/21 1552  ETH <10    IMAGING past 24 hours CT ANGIO HEAD W OR WO CONTRAST  Result Date: 01/12/2021 : CLINICAL DATA:   Intracranial hemorrhage EXAM: CT ANGIOGRAPHY HEAD AND NECK TECHNIQUE: Multidetector CT imaging of the head and neck was performed using the standard protocol during bolus administration of intravenous contrast. Multiplanar CT image reconstructions and MIPs were obtained to evaluate the vascular  anatomy. Carotid stenosis measurements (when applicable) are obtained utilizing NASCET criteria, using the distal internal carotid diameter as the denominator. CONTRAST:   51mL OMNIPAQUE IOHEXOL 350 MG/ML SOLN COMPARISON:   MRI head and CT head 01/11/2021 FINDINGS: CT HEAD FINDINGS Brain: Left occipital high-density hematoma unchanged in size measuring approximately 3.7 x 2.4 cm. Mild surrounding edema. Mild adjacent subarachnoid hemorrhage is unchanged. Ventricle size normal. No midline shift Moderate white matter hypodensity diffusely is chronic and unchanged from prior studies. This is most likely due to chronic microvascular ischemia. No acute ischemic infarct or mass Vascular: Negative for hyperdense vessel Skull: Negative Sinuses: Mucosal edema throughout the paranasal sinuses. Air-fluid level right frontal sinus and sphenoid sinus. Complete opacification right maxillary sinus. Bony thickening of the maxillary sinus bilaterally in the sphenoid sinus. Mastoid sinus clear on the right with mild left mastoid effusion. Orbits: Negative Review of the MIP images confirms the above findings CTA NECK FINDINGS Aortic arch: Standard branching. Imaged portion shows no evidence of aneurysm or dissection. No significant stenosis of the major arch vessel origins. Atherosclerotic calcification in the aortic arch and proximal left subclavian artery Right carotid system: Mild atherosclerotic disease  right carotid bifurcation. No right carotid stenosis or dissection Left carotid system: Mild atherosclerotic calcification left carotid bifurcation. Negative for stenosis or dissection Vertebral arteries: Right vertebral artery dominant and widely patent to the basilar. Small left vertebral artery with small contribution to the basilar. Skeleton: Cervical spondylosis without acute abnormality. Other neck: Negative for mass or adenopathy in the neck Upper chest: Mild apical scarring bilaterally. Remaining lung apices clear. Review  of the MIP images confirms the above findings CTA HEAD FINDINGS Anterior circulation: Atherosclerotic calcification in the cavernous carotid on the right with mild stenosis of the supraclinoid segment. Mild atherosclerotic disease left cavernous carotid without stenosis. Anterior and middle cerebral arteries widely patent. No stenosis, large vessel occlusion, or vascular malformation Posterior circulation: Right vertebral artery is widely patent with mild atherosclerotic distally. Right vertebral artery is dominant. Small left vertebral artery. Basilar is widely patent. Superior cerebellar and posterior cerebral arteries are patent. No large vessel occlusion. Negative for vascular malformation related to the left occipital hematoma. Venous sinuses: Normal vascular enhancement Anatomic variants: None Review of the MIP images confirms the above findings IMPRESSION: 1. Left occipital hematoma unchanged in size. Small adjacent subarachnoid hemorrhage unchanged. Differential diagnosis includes hypertensive hemorrhage versus cerebral amyloid. No underlying vascular malformation on CTA 2. No significant carotid or vertebral artery stenosis in the neck. Electronically Signed By: Franchot Gallo M.D. On: 01/12/2021 13:48 Electronically Signed   By: Franchot Gallo M.D.   On: 01/12/2021 14:09   CT ANGIO NECK W OR WO CONTRAST  Result Date: 01/12/2021 CLINICAL DATA:  Intracranial hemorrhage EXAM: CT ANGIOGRAPHY HEAD AND NECK TECHNIQUE: Multidetector CT imaging of the head and neck was performed using the standard protocol during bolus administration of intravenous contrast. Multiplanar CT image reconstructions and MIPs were obtained to evaluate the vascular anatomy. Carotid stenosis measurements (when applicable) are obtained utilizing NASCET criteria, using the distal internal carotid diameter as the denominator. CONTRAST:  63mL OMNIPAQUE IOHEXOL 350 MG/ML SOLN COMPARISON:  MRI head and CT head 01/11/2021 FINDINGS: CT HEAD  FINDINGS Brain: Left occipital high-density hematoma unchanged in size measuring approximately 3.7 x 2.4 cm. Mild surrounding edema. Mild adjacent subarachnoid hemorrhage is unchanged. Ventricle size normal. No midline shift Moderate white matter hypodensity diffusely is chronic and unchanged from prior studies. This is most likely due to chronic microvascular ischemia. No acute ischemic infarct or mass Vascular: Negative for hyperdense vessel Skull: Negative Sinuses: Mucosal edema throughout the paranasal sinuses. Air-fluid level right frontal sinus and sphenoid sinus. Complete opacification right maxillary sinus. Bony thickening of the maxillary sinus bilaterally in the sphenoid sinus. Mastoid sinus clear on the right with mild left mastoid effusion. Orbits: Negative Review of the MIP images confirms the above findings CTA NECK FINDINGS Aortic arch: Standard branching. Imaged portion shows no evidence of aneurysm or dissection. No significant stenosis of the major arch vessel origins. Atherosclerotic calcification in the aortic arch and proximal left subclavian artery Right carotid system: Mild atherosclerotic disease right carotid bifurcation. No right carotid stenosis or dissection Left carotid system: Mild atherosclerotic calcification left carotid bifurcation. Negative for stenosis or dissection Vertebral arteries: Right vertebral artery dominant and widely patent to the basilar. Small left vertebral artery with small contribution to the basilar. Skeleton: Cervical spondylosis without acute abnormality. Other neck: Negative for mass or adenopathy in the neck Upper chest: Mild apical scarring bilaterally. Remaining lung apices clear. Review of the MIP images confirms the above findings CTA HEAD FINDINGS Anterior circulation: Atherosclerotic calcification in the cavernous carotid on the  right with mild stenosis of the supraclinoid segment. Mild atherosclerotic disease left cavernous carotid without stenosis.  Anterior and middle cerebral arteries widely patent. No stenosis, large vessel occlusion, or vascular malformation Posterior circulation: Right vertebral artery is widely patent with mild atherosclerotic distally. Right vertebral artery is dominant. Small left vertebral artery. Basilar is widely patent. Superior cerebellar and posterior cerebral arteries are patent. No large vessel occlusion. Negative for vascular malformation related to the left occipital hematoma. Venous sinuses: Normal vascular enhancement Anatomic variants: None Review of the MIP images confirms the above findings IMPRESSION: 1. Left occipital hematoma unchanged in size. Small adjacent subarachnoid hemorrhage unchanged. Differential diagnosis includes hypertensive hemorrhage versus cerebral amyloid. No underlying vascular malformation on CTA 2. No significant carotid or vertebral artery stenosis in the neck. Electronically Signed   By: Franchot Gallo M.D.   On: 01/12/2021 13:48    PHYSICAL EXAM Pleasant elderly Caucasian male not in distress.  He appears anxious. and fidgety. . Afebrile. Head is nontraumatic. Neck is supple without bruit.    Cardiac exam no murmur or gallop. Lungs are clear to auscultation. Distal pulses are well felt.  Neurological Exam ;  Awake  Alert moderate expressive aphasia with significant nonfluent speech and word finding difficulties and paraphasic errors.  Comprehension seems better preserved.  Difficulty with naming repetition.. Normal speech and language.eye movements full without nystagmus.fundi were not visualized. Vision acuity  appears normal.  Dense right homonymous hemianopsia.  Hearing is normal. Palatal movements are normal. Face symmetric. Tongue midline. Normal strength, tone, reflexes and coordination. Normal sensation. Gait deferred.  ASSESSMENT/PLAN Lee Blevins is a 75 y.o. male with PMH significant for hemorrhagic CVA, HTN, HLD, asthma admitted wtih confusion, aphasia and impaired  vision. 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  Left occipital intraparenchymal hematoma which may be due to amyloid angiopathy or hypertension. Small area of acute ischemia in the left centrum semiovale likely from small vessel disease  Code Stroke CT head  Area of acute hemorrhage within the left occipital lobe measuring up to 3.7 cm. Atrophy, chronic small vessel disease Repeat CT head Unchanged appearance of left occipital intraparenchymal hematoma with mild surrounding edema. Unchanged appearance of multiple old infarcts and chronic microvascular ischemia. CTA head & neck  Left occipital hematoma unchanged in size. Small adjacent subarachnoid hemorrhage unchanged.No underlying vascular malformation on CTA No significant carotid or vertebral artery stenosis in the neck MRI  Unchanged size of left occipital intraparenchymal hematoma allowing for differences in modality. Small area of acute ischemia in the left centrum semiovale. Chronic microvascular ischemia and volume loss.   2D Echo pending LDL 130 HgbA1c 5.6 VTE prophylaxis - SCDs Not on anticoagulant or antiplatelets prior to admission NO ANTIPLATELET OR ANTICOAGULANTS Obtain stat head CT for any neurologic decline  Therapy recommendations:  CIR Disposition:  CIR Plan to transfer out of ICU today and to hospitalist team per protocol.   Hypertension Home meds:  Benicar, Avapro substituted here.  BP moderately high on admission in 150s, off cardene infusion BP systolic goal less than 500 Continue prn hydralazine, labetolol (discussed bystolic listed allergy with pharmacist, deemed low risk)  Long-term BP goal normotensive  Hyperlipidemia Home meds:  None LDL 130, not at goal < 70 High intensity statin on hold in setting of ICH, to be considered at discharge (Lipitor 40mg )         Nutrition On regular diet,  tolerating  well  Other Stroke Risk Factors Advanced Age >/= 72  Former Cigarette smoker, quit 40 years ago  Hx of ICH  Current ETOH use of 3 glasses of wine per night   Other Active Problems   Hospital day # 2  This plan of care was directed by Dr. Leonie Man.  Lee Brooke, NP-C  I have personally obtained history,examined this patient, reviewed notes, independently viewed imaging studies, participated in medical decision making and plan of care.ROS completed by me personally and pertinent positives fully documented  I have made any additions or clarifications directly to the above note. Agree with note above.  Continue strict control of hypertension with blood pressure goal below 160 and mobilize out of bed and therapy consults.  Transfer to neurology floor bed when available hopefully transfer to inpatient rehab over the next few days.  Long discussion with patient, his wife and son at the bedside and answered questions This patient is critically ill and at significant risk of neurological worsening, death and care requires constant monitoring of vital signs, hemodynamics,respiratory and cardiac monitoring, extensive review of multiple databases, frequent neurological assessment, discussion with family, other specialists and medical decision making of high complexity.I have made any additions or clarifications directly to the above note.This critical care time does not reflect procedure time, or teaching time or supervisory time of PA/NP/Med Resident etc but could involve care discussion time.  I spent 30 minutes of neurocritical care time  in the care of  this patient.     Antony Contras, MD Medical Director [facility] Pager: [phone] 01/13/2021 3:25 PM      I

## 2021-01-13 NOTE — Progress Notes (Signed)
? ?  Inpatient Rehab Admissions Coordinator : ? ?Per therapy recommendations, patient was screened for CIR candidacy by Naphtali Zywicki RN MSN.  At this time patient appears to be a potential candidate for CIR. I will place a rehab consult per protocol for full assessment. Please call me with any questions. ? ?Emili Mcloughlin RN MSN ?Admissions Coordinator ?[phone] ?  ?

## 2021-01-13 NOTE — Progress Notes (Signed)
Physical Therapy Treatment Patient Details Name: Lee Blevins MRN: [id number] DOB: [birth year] Today's Date: 01/13/2021   History of Present Illness 75 yo admitted wtih confusion, aphasia and impaired vision. 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 PMH: hemorrhagic CVA, HTN, HLD, asthma.    PT Comments    Patient progressing towards physical therapy goals. Patient continues to be limited by cognitive and R visual deficits. Extensive family and patient education on current deficits and need for follow up therapies. Provided opportunity for wife and son to ask questions regarding current state, wife and son appreciative. Patient with difficulty recalling 1 step directions during ambulation and specific objects after 30 seconds of being provided to him. Patient requires cues for scanning R environment with intermittent follow through. Patient has difficulty with compensatory strategies due to decreased awareness of deficits. Continue to recommend comprehensive inpatient rehab (CIR) for post-acute therapy needs.    Recommendations for follow up therapy are one component of a multi-disciplinary discharge planning process, led by the attending physician.  Recommendations may be updated based on patient status, additional functional criteria and insurance authorization.  Follow Up Recommendations  CIR     Equipment Recommendations  None recommended by PT    Recommendations for Other Services Rehab consult     Precautions / Restrictions Precautions Precautions: Fall Precaution Comments: BP <140, R visual field deficit, R inattention Restrictions Weight Bearing Restrictions: No     Mobility  Bed Mobility Overal bed mobility: Needs Assistance Bed Mobility: Supine to Sit     Supine to sit: Supervision          Transfers Overall transfer level: Needs assistance Equipment used:  None Transfers: Sit to/from Stand Sit to Stand: Min guard         General transfer comment: min guard for safety  Ambulation/Gait Ambulation/Gait assistance: Min assist Gait Distance (Feet): 200 Feet Assistive device: None Gait Pattern/deviations: Step-through pattern;Decreased stride length;Staggering left;Staggering right;Drifts right/left Gait velocity: decreased   General Gait Details: minA for balance and avoiding obstacles on the R. Patient requires cues for wayfinding. Unable to locate objects during ambulation on R requiring patient to stop and face object. Patient unable to recall directions 30 seconds after provided for patient. Patient gets anxious during mobility when unable to remember directions. LOB x 2-3 during mobility requiring minA to recover.   Stairs             Wheelchair Mobility    Modified Rankin (Stroke Patients Only) Modified Rankin (Stroke Patients Only) Pre-Morbid Rankin Score: No significant disability Modified Rankin: Moderately severe disability     Balance Overall balance assessment: Needs assistance Sitting-balance support: No upper extremity supported;Feet supported Sitting balance-Leahy Scale: Good     Standing balance support: No upper extremity supported;During functional activity Standing balance-Leahy Scale: Fair Standing balance comment: requires assist for safety and balance                            Cognition Arousal/Alertness: Awake/alert Behavior During Therapy: Anxious Overall Cognitive Status: Impaired/Different from baseline Area of Impairment: Orientation;Attention;Memory;Following commands;Safety/judgement;Awareness;Problem solving                 Orientation Level: Disoriented to;Situation;Time Current Attention Level: Sustained Memory: Decreased short-term memory Following Commands: Follows one step commands with increased time Safety/Judgement: Decreased awareness of safety;Decreased  awareness of deficits Awareness: Intellectual Problem Solving: Slow processing;Difficulty  sequencing;Requires verbal cues;Requires tactile cues General Comments: continues to demonstrate poor awareness into deficits. Disoriented to situation and time. Patient with STM deficits with inability to recall 1 step instructions and multi-step instructions during mobility. Requires verbal and tactile cues for wayfinding back to room.      Exercises      General Comments        Pertinent Vitals/Pain Pain Assessment: No/denies pain    Home Living                      Prior Function            PT Goals (current goals can now be found in the care plan section) Acute Rehab PT Goals Patient Stated Goal: to go home PT Goal Formulation: With patient Time For Goal Achievement: 01/26/21 Potential to Achieve Goals: Good Progress towards PT goals: Progressing toward goals    Frequency    Min 4X/week      PT Plan Current plan remains appropriate    Co-evaluation              AM-PAC PT "6 Clicks" Mobility   Outcome Measure  Help needed turning from your back to your side while in a flat bed without using bedrails?: A Little Help needed moving from lying on your back to sitting on the side of a flat bed without using bedrails?: A Little Help needed moving to and from a bed to a chair (including a wheelchair)?: A Little Help needed standing up from a chair using your arms (e.g., wheelchair or bedside chair)?: A Little Help needed to walk in hospital room?: A Little Help needed climbing 3-5 steps with a railing? : A Little 6 Click Score: 18    End of Session Equipment Utilized During Treatment: Gait belt Activity Tolerance: Patient tolerated treatment well Patient left: in chair;with call bell/phone within reach;with chair alarm set Nurse Communication: Mobility status PT Visit Diagnosis: Unsteadiness on feet (R26.81);Muscle weakness (generalized) (M62.81)      Time: 1354-1450 PT Time Calculation (min) (ACUTE ONLY): 56 min  Charges:  $Gait Training: 23-37 mins $Therapeutic Activity: 23-37 mins                     Ajee Heasley A. Gilford Rile PT, DPT Acute Rehabilitation Services Pager [phone] Office [phone]    Linna Hoff 01/13/2021, 4:54 PM

## 2021-01-13 NOTE — Progress Notes (Signed)
  Echocardiogram 2D Echocardiogram has been performed.  Merrie Roof F 01/13/2021, 12:05 PM

## 2021-01-14 DIAGNOSIS — I611 Nontraumatic intracerebral hemorrhage in hemisphere, cortical: Secondary | ICD-10-CM | POA: Diagnosis not present

## 2021-01-14 DIAGNOSIS — I619 Nontraumatic intracerebral hemorrhage, unspecified: Principal | ICD-10-CM

## 2021-01-14 MED ORDER — TRAZODONE HCL 50 MG PO TABS: 50.0000 mg | ORAL_TABLET | Freq: Every evening | ORAL | Status: DC | PRN

## 2021-01-14 MED ORDER — IPRATROPIUM-ALBUTEROL 0.5-2.5 (3) MG/3ML IN SOLN: 3.0000 mL | RESPIRATORY_TRACT | Status: DC | PRN

## 2021-01-14 MED ORDER — OXYCODONE HCL 5 MG PO TABS: 5.0000 mg | ORAL_TABLET | ORAL | Status: DC | PRN

## 2021-01-14 MED ORDER — PREDNISONE 5 MG PO TABS
5.0000 mg | ORAL_TABLET | Freq: Every day | ORAL | Status: DC
  Administered 2021-01-15 – 2021-01-17 (×3): 5 mg via ORAL
  Filled 2021-01-14 (×4): qty 1

## 2021-01-14 NOTE — Progress Notes (Signed)
Inpatient Rehab Admissions Coordinator:   Consult received. I met with patient and his family (spouse and son) at the bedside to discuss CIR recommendations, goals, and expectations.  I let them know that CIR would be about 2 weeks, dependent upon progress, and goals at discharge would likely be supervision.  Pt's spouse is home during the day but limited in her ability to physically assist patient.  Insurance authorization is not needed for CIR with medicare, but I do not have a bed for this patient at this time.  Spoke to Dr. Reesa Chew, if we have a bed open up this weekend we may be able to admit; otherwise I will f/u on Monday with bed availability for next week.   Shann Medal, PT, DPT Admissions Coordinator [phone] 01/14/21  11:41 AM

## 2021-01-14 NOTE — Progress Notes (Signed)
STROKE TEAM PROGRESS NOTE   INTERVAL HISTORY No acute events, remains off continuous infusions. BP stable. Afebrile. Speech improving, remains with right sided visual deficit.  Vital signs stable.  Blood pressure adequately controlled.  Neurological exam unchanged Vitals:   01/13/21 0400 01/13/21 0500 01/13/21 0600 01/13/21 0800  BP: 135/66 121/63 120/62 (!) 142/79  Pulse: 63 (!) 59 61 74  Resp: 13 15 15 17   Temp: 98.3 F (36.8 C)     TempSrc: Oral     SpO2: (!) 89% 96% 93% 93%  Weight:      Height:       CBC:  Recent Labs  Lab 01/11/21 1552 01/11/21 1601 01/12/21 0100  WBC 5.9  --  6.0  NEUTROABS 4.8  --   --   HGB 14.6 15.3 13.5  HCT 44.1 45.0 40.0  MCV 100.2*  --  98.8  PLT 193  --  220   Basic Metabolic Panel:  Recent Labs  Lab 01/11/21 1552 01/11/21 1601 01/12/21 0100  NA 143 135 136  K 4.5 4.3 3.6  CL 107 102 104  CO2 28  --  26  GLUCOSE 146* 143* 109*  BUN 17 14 11   CREATININE 0.90 0.80 0.85  CALCIUM 10.0  --  9.0   Lipid Panel:  Recent Labs  Lab 01/12/21 0100  CHOL 250*  TRIG 42  HDL 112  CHOLHDL 2.2  VLDL 8  LDLCALC 130*   HgbA1c:  Recent Labs  Lab 01/12/21 0100  HGBA1C 5.2   Urine Drug Screen:  Recent Labs  Lab 01/11/21 1656  LABOPIA NONE DETECTED  COCAINSCRNUR NONE DETECTED  LABBENZ NONE DETECTED  AMPHETMU NONE DETECTED  THCU NONE DETECTED  LABBARB NONE DETECTED    Alcohol Level  Recent Labs  Lab 01/11/21 1552  ETH <10    IMAGING past 24 hours CT ANGIO HEAD W OR WO CONTRAST  Result Date: 01/12/2021 : CLINICAL DATA:   Intracranial hemorrhage EXAM: CT ANGIOGRAPHY HEAD AND NECK TECHNIQUE: Multidetector CT imaging of the head and neck was performed using the standard protocol during bolus administration of intravenous contrast. Multiplanar CT image reconstructions and MIPs were obtained to evaluate the vascular anatomy. Carotid stenosis measurements (when applicable) are obtained utilizing NASCET criteria, using the  distal internal carotid diameter as the denominator. CONTRAST:   51mL OMNIPAQUE IOHEXOL 350 MG/ML SOLN COMPARISON:   MRI head and CT head 01/11/2021 FINDINGS: CT HEAD FINDINGS Brain: Left occipital high-density hematoma unchanged in size measuring approximately 3.7 x 2.4 cm. Mild surrounding edema. Mild adjacent subarachnoid hemorrhage is unchanged. Ventricle size normal. No midline shift Moderate white matter hypodensity diffusely is chronic and unchanged from prior studies. This is most likely due to chronic microvascular ischemia. No acute ischemic infarct or mass Vascular: Negative for hyperdense vessel Skull: Negative Sinuses: Mucosal edema throughout the paranasal sinuses. Air-fluid level right frontal sinus and sphenoid sinus. Complete opacification right maxillary sinus. Bony thickening of the maxillary sinus bilaterally in the sphenoid sinus. Mastoid sinus clear on the right with mild left mastoid effusion. Orbits: Negative Review of the MIP images confirms the above findings CTA NECK FINDINGS Aortic arch: Standard branching. Imaged portion shows no evidence of aneurysm or dissection. No significant stenosis of the major arch vessel origins. Atherosclerotic calcification in the aortic arch and proximal left subclavian artery Right carotid system: Mild atherosclerotic disease right carotid bifurcation. No right carotid stenosis or dissection Left carotid system: Mild atherosclerotic calcification left carotid bifurcation. Negative for stenosis or dissection  Vertebral arteries: Right vertebral artery dominant and widely patent to the basilar. Small left vertebral artery with small contribution to the basilar. Skeleton: Cervical spondylosis without acute abnormality. Other neck: Negative for mass or adenopathy in the neck Upper chest: Mild apical scarring bilaterally. Remaining lung apices clear. Review of the MIP images confirms the above findings CTA HEAD FINDINGS Anterior circulation: Atherosclerotic  calcification in the cavernous carotid on the right with mild stenosis of the supraclinoid segment. Mild atherosclerotic disease left cavernous carotid without stenosis. Anterior and middle cerebral arteries widely patent. No stenosis, large vessel occlusion, or vascular malformation Posterior circulation: Right vertebral artery is widely patent with mild atherosclerotic distally. Right vertebral artery is dominant. Small left vertebral artery. Basilar is widely patent. Superior cerebellar and posterior cerebral arteries are patent. No large vessel occlusion. Negative for vascular malformation related to the left occipital hematoma. Venous sinuses: Normal vascular enhancement Anatomic variants: None Review of the MIP images confirms the above findings IMPRESSION: 1. Left occipital hematoma unchanged in size. Small adjacent subarachnoid hemorrhage unchanged. Differential diagnosis includes hypertensive hemorrhage versus cerebral amyloid. No underlying vascular malformation on CTA 2. No significant carotid or vertebral artery stenosis in the neck. Electronically Signed By: Franchot Gallo M.D. On: 01/12/2021 13:48 Electronically Signed   By: Franchot Gallo M.D.   On: 01/12/2021 14:09   CT ANGIO NECK W OR WO CONTRAST  Result Date: 01/12/2021 CLINICAL DATA:  Intracranial hemorrhage EXAM: CT ANGIOGRAPHY HEAD AND NECK TECHNIQUE: Multidetector CT imaging of the head and neck was performed using the standard protocol during bolus administration of intravenous contrast. Multiplanar CT image reconstructions and MIPs were obtained to evaluate the vascular anatomy. Carotid stenosis measurements (when applicable) are obtained utilizing NASCET criteria, using the distal internal carotid diameter as the denominator. CONTRAST:  60mL OMNIPAQUE IOHEXOL 350 MG/ML SOLN COMPARISON:  MRI head and CT head 01/11/2021 FINDINGS: CT HEAD FINDINGS Brain: Left occipital high-density hematoma unchanged in size measuring approximately 3.7 x  2.4 cm. Mild surrounding edema. Mild adjacent subarachnoid hemorrhage is unchanged. Ventricle size normal. No midline shift Moderate white matter hypodensity diffusely is chronic and unchanged from prior studies. This is most likely due to chronic microvascular ischemia. No acute ischemic infarct or mass Vascular: Negative for hyperdense vessel Skull: Negative Sinuses: Mucosal edema throughout the paranasal sinuses. Air-fluid level right frontal sinus and sphenoid sinus. Complete opacification right maxillary sinus. Bony thickening of the maxillary sinus bilaterally in the sphenoid sinus. Mastoid sinus clear on the right with mild left mastoid effusion. Orbits: Negative Review of the MIP images confirms the above findings CTA NECK FINDINGS Aortic arch: Standard branching. Imaged portion shows no evidence of aneurysm or dissection. No significant stenosis of the major arch vessel origins. Atherosclerotic calcification in the aortic arch and proximal left subclavian artery Right carotid system: Mild atherosclerotic disease right carotid bifurcation. No right carotid stenosis or dissection Left carotid system: Mild atherosclerotic calcification left carotid bifurcation. Negative for stenosis or dissection Vertebral arteries: Right vertebral artery dominant and widely patent to the basilar. Small left vertebral artery with small contribution to the basilar. Skeleton: Cervical spondylosis without acute abnormality. Other neck: Negative for mass or adenopathy in the neck Upper chest: Mild apical scarring bilaterally. Remaining lung apices clear. Review of the MIP images confirms the above findings CTA HEAD FINDINGS Anterior circulation: Atherosclerotic calcification in the cavernous carotid on the right with mild stenosis of the supraclinoid segment. Mild atherosclerotic disease left cavernous carotid without stenosis. Anterior and middle cerebral arteries widely patent.  No stenosis, large vessel occlusion, or vascular  malformation Posterior circulation: Right vertebral artery is widely patent with mild atherosclerotic distally. Right vertebral artery is dominant. Small left vertebral artery. Basilar is widely patent. Superior cerebellar and posterior cerebral arteries are patent. No large vessel occlusion. Negative for vascular malformation related to the left occipital hematoma. Venous sinuses: Normal vascular enhancement Anatomic variants: None Review of the MIP images confirms the above findings IMPRESSION: 1. Left occipital hematoma unchanged in size. Small adjacent subarachnoid hemorrhage unchanged. Differential diagnosis includes hypertensive hemorrhage versus cerebral amyloid. No underlying vascular malformation on CTA 2. No significant carotid or vertebral artery stenosis in the neck. Electronically Signed   By: Franchot Gallo M.D.   On: 01/12/2021 13:48    PHYSICAL EXAM Pleasant elderly Caucasian male not in distress.  He appears anxious. and fidgety. . Afebrile. Head is nontraumatic. Neck is supple without bruit.    Cardiac exam no murmur or gallop. Lungs are clear to auscultation. Distal pulses are well felt.  Neurological Exam ;  Awake  Alert moderate expressive aphasia with significant nonfluent speech and word finding difficulties and paraphasic errors.  Comprehension seems better preserved.  Difficulty with naming repetition.. Normal speech and language.eye movements full without nystagmus.fundi were not visualized. Vision acuity  appears normal.  Dense right homonymous hemianopsia.  Hearing is normal. Palatal movements are normal. Face symmetric. Tongue midline. Normal strength, tone, reflexes and coordination. Normal sensation. Gait deferred.  ASSESSMENT/PLAN Lee Blevins is a 75 y.o. male with PMH significant for hemorrhagic CVA, HTN, HLD, asthma admitted wtih confusion, aphasia and impaired vision. MRI + L occipital intraparenchymal hematoma, small area of acutre ischemia L centrum ovale; chronic  hemorrhage over the posterior L parietal lobe.  Left occipital intraparenchymal hematoma which may be due to amyloid angiopathy or hypertension. Small area of acute ischemia in the left centrum semiovale likely from small vessel disease  Code Stroke CT head  Area of acute hemorrhage within the left occipital lobe measuring up to 3.7 cm. Atrophy, chronic small vessel disease Repeat CT head Unchanged appearance of left occipital intraparenchymal hematoma with mild surrounding edema. Unchanged appearance of multiple old infarcts and chronic microvascular ischemia. CTA head & neck  Left occipital hematoma unchanged in size. Small adjacent subarachnoid hemorrhage unchanged.No underlying vascular malformation on CTA No significant carotid or vertebral artery stenosis in the neck MRI  Unchanged size of left occipital intraparenchymal hematoma allowing for differences in modality. Small area of acute ischemia in the left centrum semiovale. Chronic microvascular ischemia and volume loss.   2D Echo pending LDL 130 HgbA1c 5.6 VTE prophylaxis - SCDs Not on anticoagulant or antiplatelets prior to admission NO ANTIPLATELET OR ANTICOAGULANTS Obtain stat head CT for any neurologic decline  Therapy recommendations:  CIR Disposition:  CIR Plan to transfer out of ICU today and to hospitalist team per protocol.   Hypertension Home meds:  Benicar, Avapro substituted here.  BP moderately high on admission in 150s, off cardene infusion BP systolic goal less than 941 Continue prn hydralazine, labetolol (discussed bystolic listed allergy with pharmacist, deemed low risk)  Long-term BP goal normotensive  Hyperlipidemia Home meds:  None LDL 130, not at goal < 70 High intensity statin on hold in setting of ICH, to be considered at discharge (Lipitor 40mg )         Nutrition On regular diet, tolerating well  Other Stroke Risk Factors Advanced Age >/= 59  Former Cigarette smoker, quit 40 years ago   Hx of ICH  Current ETOH use of 3 glasses of wine per night   Other Active Problems   Hospital day # 2  Continue strict control of hypertension with blood pressure goal below 160 and mobilize out of bed and therapy consults.  Transfer to neurology floor bed today and hopefully transfer to inpatient rehab over the next few days.  Long discussion with patient,  and answered questions.  Transfer to medical hospitalist team is primary attending.  Discussed with Dr. Reesa Chew.  Greater than 50% time during this 35-minute visit was spent on counseling and coordination of care and discussion with care team.       Antony Contras, MD Medical Director [place] Pager: [phone] 01/14/2021 9:11 AM      I

## 2021-01-14 NOTE — Progress Notes (Signed)
Physical Therapy Treatment Patient Details Name: Lee Blevins MRN: [id number] DOB: 1945/10/30 Today's Date: 01/14/2021   History of Present Illness 75 yo admitted wtih confusion, aphasia and impaired vision. 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 PMH: hemorrhagic CVA, HTN, HLD, asthma.    PT Comments    Patient with emerging awareness into deficits this session, however continues to require consistent cues to visual scan to R during mobility. Patient with difficulty navigating close spaces and bumping into each object on R with no correction and requires minA for balance. Patient able to describe visual deficits when asked "what did the stroke affect?". Continue to recommend comprehensive inpatient rehab (CIR) for post-acute therapy needs.     Recommendations for follow up therapy are one component of a multi-disciplinary discharge planning process, led by the attending physician.  Recommendations may be updated based on patient status, additional functional criteria and insurance authorization.  Follow Up Recommendations  CIR     Equipment Recommendations  None recommended by PT    Recommendations for Other Services Rehab consult     Precautions / Restrictions Precautions Precautions: Fall Precaution Comments: BP <140, R visual field deficit, R inattention Restrictions Weight Bearing Restrictions: No     Mobility  Bed Mobility Overal bed mobility: Needs Assistance Bed Mobility: Supine to Sit     Supine to sit: Supervision          Transfers Overall transfer level: Needs assistance Equipment used: None Transfers: Sit to/from Stand Sit to Stand: Min guard         General transfer comment: min guard for safety  Ambulation/Gait Ambulation/Gait assistance: Min assist Gait Distance (Feet): 200 Feet Assistive device: None Gait Pattern/deviations:  Step-through pattern;Decreased stride length;Staggering left;Staggering right;Drifts right/left Gait velocity: decreased   General Gait Details: minA for balance during scavenger hunt in hallway. Improved visual scanning during ambulation to find objects on right. Difficulty finding room number when returning to room. Patient requires assist for balance during navigation of close spaces, bumping into each object on R with no correction.   Stairs             Wheelchair Mobility    Modified Rankin (Stroke Patients Only) Modified Rankin (Stroke Patients Only) Pre-Morbid Rankin Score: No significant disability Modified Rankin: Moderately severe disability     Balance Overall balance assessment: Needs assistance Sitting-balance support: No upper extremity supported;Feet supported Sitting balance-Leahy Scale: Good     Standing balance support: No upper extremity supported;During functional activity Standing balance-Leahy Scale: Fair Standing balance comment: requires assist for safety and balance                            Cognition Arousal/Alertness: Awake/alert Behavior During Therapy: Anxious Overall Cognitive Status: Impaired/Different from baseline Area of Impairment: Orientation;Attention;Memory;Following commands;Safety/judgement;Awareness;Problem solving                 Orientation Level: Disoriented to;Time Current Attention Level: Sustained Memory: Decreased short-term memory Following Commands: Follows one step commands with increased time Safety/Judgement: Decreased awareness of safety;Decreased awareness of deficits Awareness: Emergent Problem Solving: Slow processing;Difficulty sequencing;Requires verbal cues;Requires tactile cues General Comments: able to describe situation and deficits. Improving awareness of deficits with ability to describe visual deficits and was able to scan environment during ambulation.      Exercises       General Comments  Pertinent Vitals/Pain Pain Assessment: No/denies pain    Home Living                      Prior Function            PT Goals (current goals can now be found in the care plan section) Acute Rehab PT Goals Patient Stated Goal: to go home PT Goal Formulation: With patient Time For Goal Achievement: 01/26/21 Potential to Achieve Goals: Good Progress towards PT goals: Progressing toward goals    Frequency    Min 4X/week      PT Plan Current plan remains appropriate    Co-evaluation              AM-PAC PT "6 Clicks" Mobility   Outcome Measure  Help needed turning from your back to your side while in a flat bed without using bedrails?: A Little Help needed moving from lying on your back to sitting on the side of a flat bed without using bedrails?: A Little Help needed moving to and from a bed to a chair (including a wheelchair)?: A Little Help needed standing up from a chair using your arms (e.g., wheelchair or bedside chair)?: A Little Help needed to walk in hospital room?: A Little Help needed climbing 3-5 steps with a railing? : A Little 6 Click Score: 18    End of Session Equipment Utilized During Treatment: Gait belt Activity Tolerance: Patient tolerated treatment well Patient left: in chair;with call bell/phone within reach Nurse Communication: Mobility status PT Visit Diagnosis: Unsteadiness on feet (R26.81);Muscle weakness (generalized) (M62.81)     Time: 6659-9357 PT Time Calculation (min) (ACUTE ONLY): 28 min  Charges:  $Gait Training: 23-37 mins                     Caitlynn Ju A. Gilford Rile PT, DPT Acute Rehabilitation Services Pager [phone] Office [phone]    Linna Hoff 01/14/2021, 5:28 PM

## 2021-01-14 NOTE — PMR Pre-admission (Signed)
PMR Admission Coordinator Pre-Admission Assessment  Patient: Lee Blevins is an 75 y.o., male MRN: [id number] DOB: 1946-03-25 Height: '6\' 1"'  (185.4 cm) Weight: 77.1 kg  Insurance Information HMO:     PPO:      PCP:      IPA:      80/20:      OTHER:  PRIMARY: medicare a/b      Policy#: [id number]      Subscriber: pt CM Name:       Phone#:      Fax#:  Pre-Cert#: verified Civil engineer, contracting:  Benefits:  Phone #:      Name:  Eff. Date: 11/15/08 A and B     Deduct: $1556      Out of Pocket Max: n/a      Life Max: n/a CIR: 100%      SNF: 20 full days Outpatient: 80%     Co-Pay: 20% Home Health: 100%      Co-Pay:  DME: 80%     Co-Pay: 20% Providers:  SECONDARY: [employer]      Policy#: [id number]     Phone#: [phone]  Financial Counselor:       Phone#:   The "Data Collection Information Summary" for patients in Inpatient Rehabilitation Facilities with attached "Privacy Act [place] Records" was provided and verbally reviewed with: Patient and Family  Emergency Contact Information Contact Information     Name Relation Home Work Mobile   Birdsell,Margaret Spouse [phone]  [phone]       Current Medical History  Patient Admitting Diagnosis: CVA   History of Present Illness: Lee Blevins is a 75 year old right-handed male with history of hypertension, 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  Presented 01/11/2021 with blurred vision/right side visual deficit and altered mental status.  On arrival to the ED systolic blood pressure in the 150s.  CT/MRI showed area of hemorrhage noted to the left occipital lobe measuring 3 x 7 x 2 0.8 x 2.6 cm, volume of 13.5 mL.  No mass-effect or midline shift.  Area of encephalomalacia within the posterior left parietal lobe and area of prior hemorrhage.  CT angiogram head and neck  no significant carotid or vertebral artery stenosis.  Follow-up imaging remained unchanged in size of left occipital intraparenchymal hematoma.  Echocardiogram with ejection fraction of 55 to 30% grade 1 diastolic dysfunction.  Admission chemistries unremarkable except glucose 146, alcohol negative, urine drug screen negative.  He was cleared to begin subcutaneous heparin for DVT prophylaxis 01/16/2021.  Tolerating a regular diet.  Therapy evaluations completed due to patient decreased functional mobility was recommended for a comprehensive rehab program.  Complete NIHSS TOTAL: 3  Patient's medical record from [facility] has been reviewed by the rehabilitation admission coordinator and physician.  Past Medical History  Past Medical History:  Diagnosis Date   Asthma    Chronic sinusitis    History of CVA (cerebrovascular accident) 2011   hemorrhagic   HTN (hypertension)    Hyperlipidemia    Nasal polyps    Pneumonia     Has the patient had major surgery during 100 days prior to admission? No  Family History   family history includes Heart disease in his father; Neurologic Disorder (age of onset: 41) in his mother; Valvular heart disease in his father.  Current Medications  Current Facility-Administered Medications:  acetaminophen (TYLENOL) tablet 650 mg, 650 mg, Oral, Q4H PRN **OR** acetaminophen (TYLENOL) 160 MG/5ML solution 650 mg, 650 mg, Per Tube, Q4H PRN **OR** acetaminophen (TYLENOL) suppository 650 mg, 650 mg, Rectal, Q4H PRN, Bailey-Modzik, Delila A, NP   hydrALAZINE (APRESOLINE) injection 20 mg, 20 mg, Intravenous, Q4H PRN, Bailey-Modzik, Delila A, NP   ipratropium-albuterol (DUONEB) 0.5-2.5 (3) MG/3ML nebulizer solution 3 mL, 3 mL, Nebulization, Q4H PRN, Amin, Ankit Chirag, MD   irbesartan (AVAPRO) tablet 150 mg, 150 mg, Oral, Daily, Bailey-Modzik, Delila A, NP, 150 mg at 01/14/21 0911   labetalol (NORMODYNE) injection 10 mg, 10 mg, Intravenous, Q4H PRN, Bailey-Modzik, Delila  A, NP   oxyCODONE (Oxy IR/ROXICODONE) immediate release tablet 5 mg, 5 mg, Oral, Q4H PRN, Amin, Ankit Chirag, MD   pantoprazole (PROTONIX) injection 40 mg, 40 mg, Intravenous, QHS, Bailey-Modzik, Delila A, NP, 40 mg at 01/13/21 2121   predniSONE (DELTASONE) tablet 5 mg, 5 mg, Oral, Q breakfast, Bailey-Modzik, Delila A, NP   senna-docusate (Senokot-S) tablet 1 tablet, 1 tablet, Oral, BID, Bailey-Modzik, Delila A, NP, 1 tablet at 01/14/21 0911   traMADol (ULTRAM) tablet 50 mg, 50 mg, Oral, Q12H PRN, Bailey-Modzik, Delila A, NP   traZODone (DESYREL) tablet 50 mg, 50 mg, Oral, QHS PRN, Amin, Jeanella Flattery, MD  Patients Current Diet:  Diet Order             Diet Heart Room service appropriate? Yes; Fluid consistency: Thin  Diet effective now                   Precautions / Restrictions Precautions Precautions: Fall Precaution Comments: BP <140, R visual field deficit, R inattention Restrictions Weight Bearing Restrictions: No   Has the patient had 2 or more falls or a fall with injury in the past year? No  Prior Activity Level Community (5-7x/wk): drives, manages own ADLs/IADLs, no DME at baseline  Prior Functional Level Self Care: Did the patient need help bathing, dressing, using the toilet or eating? Independent  Indoor Mobility: Did the patient need assistance with walking from room to room (with or without device)? Independent  Stairs: Did the patient need assistance with internal or external stairs (with or without device)? Independent  Functional Cognition: Did the patient need help planning regular tasks such as shopping or remembering to take medications? Independent  Patient Information Are you of Hispanic, Latino/a,or Spanish origin?: A. No, not of Hispanic, Latino/a, or Spanish origin What is your race?: A. White Do you need or want an interpreter to communicate with a doctor or health care staff?: 0. No  Patient's Response To:  Health Literacy and  Transportation Is the patient able to respond to health literacy and transportation needs?: Yes Health Literacy - How often do you need to have someone help you when you read instructions, pamphlets, or other written material from your doctor or pharmacy?: Never In the past 12 months, has lack of transportation kept you from medical appointments or from getting medications?: No In the past 12 months, has lack of transportation kept you from meetings, work, or from getting things needed for daily living?: No  Development worker, international aid / [place] Devices/Equipment: Eyeglasses Home Equipment: Grab bars - tub/shower, Shower seat, Radio producer - single point  Prior Device Use: Indicate devices/aids used by the patient prior to current illness, exacerbation or injury? None of the above  Current Functional Level Cognition  Arousal/Alertness: Awake/alert Overall Cognitive Status: Impaired/Different from baseline Current Attention Level: Sustained Orientation Level: Oriented X4  Following Commands: Follows one step commands with increased time Safety/Judgement: Decreased awareness of safety, Decreased awareness of deficits General Comments: continues to demonstrate poor awareness into deficits. Disoriented to situation and time. Patient with STM deficits with inability to recall 1 step instructions and multi-step instructions during mobility. Requires verbal and tactile cues for wayfinding back to room. Attention: Sustained Sustained Attention: Appears intact Memory: Impaired Memory Impairment: Storage deficit Awareness: Impaired Awareness Impairment: Intellectual impairment, Anticipatory impairment Executive Function: Writer: Impaired Organizing Impairment: Verbal complex Safety/Judgment: Impaired    Extremity Assessment (includes Sensation/Coordination)  Upper Extremity Assessment: Defer to OT evaluation  Lower Extremity Assessment: RLE deficits/detail RLE Deficits /  Details: 5/5 strength, impaired coordination. Per patient, sensation intact RLE Coordination: decreased fine motor, decreased gross motor    ADLs  Overall ADL's : Needs assistance/impaired Eating/Feeding: Set up, Supervision/ safety Eating/Feeding Details (indicate cue type and reason): VC to find all items on tray; difficulty safely placing items on tray; apparent poor depth perception Grooming: Minimal assistance Grooming Details (indicate cue type and reason): mod vc to locate sopa/papertowels, the sink Upper Body Bathing: Set up, Supervision/ safety, Sitting Lower Body Bathing: Min guard, Sit to/from stand Upper Body Dressing : Set up, Supervision/safety Lower Body Dressing: Min guard, Sit to/from stand Lower Body Dressing Details (indicate cue type and reason): difficulty with coordination noted Toilet Transfer: Minimal assistance Toilet Transfer Details (indicate cue type and reason): Difficulty with orientation in bathrooml unable to find handle to flush toilet or sink Toileting- Clothing Manipulation and Hygiene: Min guard Functional mobility during ADLs: Minimal assistance General ADL Comments: mod A with problem solving to find handle on toilet and to locate sink, soap and paper towels in bathroom; ran into bathroom door; cues to find recliner located on his R; note impaired depth perception during funcitonal tasks with under/over reaching    Mobility  Overal bed mobility: Needs Assistance Bed Mobility: Supine to Sit Supine to sit: Supervision    Transfers  Overall transfer level: Needs assistance Equipment used: None Transfers: Sit to/from Stand Sit to Stand: Min guard General transfer comment: min guard for safety    Ambulation / Gait / Stairs / Wheelchair Mobility  Ambulation/Gait Ambulation/Gait assistance: Herbalist (Feet): 200 Feet Assistive device: None Gait Pattern/deviations: Step-through pattern, Decreased stride length, Staggering left,  Staggering right, Drifts right/left General Gait Details: minA for balance and avoiding obstacles on the R. Patient requires cues for wayfinding. Unable to locate objects during ambulation on R requiring patient to stop and face object. Patient unable to recall directions 30 seconds after provided for patient. Patient gets anxious during mobility when unable to remember directions. LOB x 2-3 during mobility requiring minA to recover. Gait velocity: decreased    Posture / Balance Balance Overall balance assessment: Needs assistance Sitting-balance support: No upper extremity supported, Feet supported Sitting balance-Leahy Scale: Good Standing balance support: No upper extremity supported, During functional activity Standing balance-Leahy Scale: Fair Standing balance comment: requires assist for safety and balance    Special needs/care consideration Na/   Previous Home Environment (from acute therapy documentation) Living Arrangements: Spouse/significant other  Lives With: Spouse Available Help at Discharge: Family Type of Home: House Home Layout: One level Home Access: Stairs to enter Technical brewer of Steps: 2 Bathroom Shower/Tub: Multimedia programmer: Programmer, systems: Yes Edith Endave: No  Discharge Living Setting Plans for Discharge Living Setting: Patient's home, Lives with (comment) (spouse) Type of Home at Discharge: House Discharge Home Layout: One  level Discharge Home Access: Stairs to enter Entrance Stairs-Rails: None Entrance Stairs-Number of Steps: 3+2 Discharge Bathroom Shower/Tub: Walk-in shower Discharge Bathroom Toilet: Standard Discharge Bathroom Accessibility: Yes How Accessible: Accessible via walker Does the patient have any problems obtaining your medications?: No  Social/Family/Support Systems Patient Roles: Spouse Anticipated Caregiver: [name] Anticipated Caregiver's Contact Information:  [phone] Ability/Limitations of Caregiver: supervision only Caregiver Availability: 24/7 Discharge Plan Discussed with Primary Caregiver: Yes Is Caregiver In Agreement with Plan?: Yes Does Caregiver/Family have Issues with Lodging/Transportation while Pt is in Rehab?: No  Goals Patient/Family Goal for Rehab: PT/OT/SLP supervision to mod I Expected length of stay: 6-9 days Pt/Family Agrees to Admission and willing to participate: Yes Program Orientation Provided & Reviewed with Pt/Caregiver Including Roles  & Responsibilities: Yes  Decrease burden of Care through IP rehab admission: no  Possible need for SNF placement upon discharge: no  Patient Condition: I have reviewed medical records from [facility], spoken with CM, and patient, spouse, and son. I met with patient at the bedside for inpatient rehabilitation assessment.  Patient will benefit from ongoing PT, OT, and SLP, can actively participate in 3 hours of therapy a day 5 days of the week, and can make measurable gains during the admission.  Patient will also benefit from the coordinated team approach during an Inpatient Acute Rehabilitation admission.  The patient will receive intensive therapy as well as Rehabilitation physician, nursing, social worker, and care management interventions.  Due to safety, skin/wound care, disease management, medication administration, pain management, and patient education the patient requires 24 hour a day rehabilitation nursing.  The patient is currently min assist with mobility and basic ADLs.  Discharge setting and therapy post discharge at home with home health is anticipated.  Patient has agreed to participate in the Acute Inpatient Rehabilitation Program and will admit today.  Preadmission Screen Completed By: Shann Medal, PT, DPT and Michel Santee, PT, DPT 01/14/2021 11:44 AM ______________________________________________________________________   Discussed status with Dr. Ranell Patrick on  01/17/21 at 10:57 AM and received approval for admission today.  Admission Coordinator:  Michel Santee, PT, DPT time 10:57 AM Sudie Grumbling 01/17/21    Assessment/Plan: Diagnosis: ICH Does the need for close, 24 hr/day Medical supervision in concert with the patient's rehab needs make it unreasonable for this patient to be served in a less intensive setting? Yes Co-Morbidities requiring supervision/potential complications: HLD, nasal polyp, hepatic cirrhosis, CAD, osteoporosis Due to bladder management, bowel management, safety, skin/wound care, disease management, medication administration, pain management, and patient education, does the patient require 24 hr/day rehab nursing? Yes Does the patient require coordinated care of a physician, rehab nurse, PT, OT, and SLP to address physical and functional deficits in the context of the above medical diagnosis(es)? Yes Addressing deficits in the following areas: balance, endurance, locomotion, strength, transferring, bowel/bladder control, bathing, dressing, feeding, grooming, toileting, and psychosocial support Can the patient actively participate in an intensive therapy program of at least 3 hrs of therapy 5 days a week? Yes The potential for patient to make measurable gains while on inpatient rehab is excellent Anticipated functional outcomes upon discharge from inpatient rehab: modified independent PT, modified independent OT, modified independent SLP Estimated rehab length of stay to reach the above functional goals is: 5 days Anticipated discharge destination: Home 10. Overall Rehab/Functional Prognosis: excellent   MD Signature: Leeroy Cha, MD

## 2021-01-14 NOTE — Progress Notes (Signed)
PROGRESS NOTE    Irfan Veal  YHC:[id number] DOB: Jun 14, 1945 DOA: 01/11/2021 PCP: Janith Lima, MD   Brief Narrative:  75 year old with history of hemorrhagic CVA, HLD, HTN, asthma who presented with confusion, visual disturbance and aphasia.  MRI was positive for left occipital intraparenchymal hemorrhage with acute ischemic stroke to the left central ovale along with chronic hemorrhage over posterior left lobe.  Patient was admitted to the ICU under neurology service and transferred to [facility].   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  Repeat scans were unchanged.  -LDL 130, A1c 5.6 - Avoid antiplatelets and anticoagulation - Repeat CT head there is a status change - Echocardiogram-EF 55 to 60%, grade 1 DD -PT/OT-CIR  Essential hypertension - Systolic goal was less than 160. Avapro 150mg  po daily. IV Hydralazine abd labetalol  prn  Hyperlipidemia - LDL 130.  Lipitor 40 mg.  Discharge  History of asthma -As needed bronchodilators    DVT prophylaxis: SCD's Start: 01/11/21 1921  Code Status: Full code Family Communication: Wife and son at bedside  Status is: Inpatient  Remains inpatient appropriate because:Unsafe d/c plan  Dispo: The patient is from: Home              Anticipated d/c is to: CIR              Patient currently is medically stable to d/c.   Difficult to place patient No       Nutritional status           Body mass index is 22.43 kg/m.           Subjective: Patient is still having right-sided visual disturbance but overall tells me speech is better.  Family confirms at the bedside.  Review of Systems Otherwise negative except as per HPI, including: General: Denies fever, chills, night sweats or  unintended weight loss. Resp: Denies cough, wheezing, shortness of breath. Cardiac: Denies chest pain, palpitations, orthopnea, paroxysmal nocturnal dyspnea. GI: Denies abdominal pain, nausea, vomiting, diarrhea or constipation GU: Denies dysuria, frequency, hesitancy or incontinence MS: Denies muscle aches, joint pain or swelling Neuro: Denies headache, neurologic deficits (focal weakness, numbness, tingling), abnormal gait Psych: Denies anxiety, depression, SI/HI/AVH Skin: Denies new rashes or lesions ID: Denies sick contacts, exotic exposures, travel  Examination:  General exam: Appears calm and comfortable  Respiratory system: Clear to auscultation. Respiratory effort normal. Cardiovascular system: S1 & S2 heard, RRR. No JVD, murmurs, rubs, gallops or clicks. No pedal edema. Gastrointestinal system: Abdomen is nondistended, soft and nontender. No organomegaly or masses felt. Normal bowel sounds heard. Central nervous system: Alert and oriented.  Very mild expressive aphasia Extremities: Symmetric 5 x 5 power. Skin: No rashes, lesions or ulcers Psychiatry: Judgement and insight appear normal. Mood & affect appropriate.     Objective: Vitals:   01/13/21 1800 01/13/21 2000 01/14/21 0000 01/14/21 0400  BP: (!) 155/84 (!) 155/82 (!) 97/57 118/61  Pulse: 80 76 71 65  Resp: 17 17 (!) 21 (!) 23  Temp:  98.8 F (37.1 C) 98.6 F (37 C) 98.3 F (36.8 C)  TempSrc:  Oral Oral   SpO2: 95% 91% 92% 90%  Weight:      Height:        Intake/Output Summary (Last 24 hours) at 01/14/2021 0726 Last data  filed at 01/14/2021 0000 Gross per 24 hour  Intake 650 ml  Output 750 ml  Net -100 ml   Filed Weights   01/11/21 1533  Weight: 77.1 kg     Data Reviewed:   CBC: Recent Labs  Lab 01/11/21 1552 01/11/21 1601 01/12/21 0100  WBC 5.9  --  6.0  NEUTROABS 4.8  --   --   HGB 14.6 15.3 13.5  HCT 44.1 45.0 40.0  MCV 100.2*  --  98.8  PLT 193  --  814   Basic Metabolic  Panel: Recent Labs  Lab 01/11/21 1552 01/11/21 1601 01/12/21 0100  NA 143 135 136  K 4.5 4.3 3.6  CL 107 102 104  CO2 28  --  26  GLUCOSE 146* 143* 109*  BUN 17 14 11   CREATININE 0.90 0.80 0.85  CALCIUM 10.0  --  9.0   GFR: Estimated Creatinine Clearance: 79.4 mL/min (by C-G formula based on SCr of 0.85 mg/dL). Liver Function Tests: Recent Labs  Lab 01/11/21 1552 01/12/21 0100  AST 22 19  ALT 14 13  ALKPHOS 55 52  BILITOT 1.0 1.4*  PROT 7.4 6.4*  ALBUMIN 4.0 3.3*   No results for input(s): LIPASE, AMYLASE in the last 168 hours. No results for input(s): AMMONIA in the last 168 hours. Coagulation Profile: Recent Labs  Lab 01/11/21 1552  INR 1.0   Cardiac Enzymes: No results for input(s): CKTOTAL, CKMB, CKMBINDEX, TROPONINI in the last 168 hours. BNP (last 3 results) No results for input(s): PROBNP in the last 8760 hours. HbA1C: Recent Labs    01/12/21 0100  HGBA1C 5.2   CBG: No results for input(s): GLUCAP in the last 168 hours. Lipid Profile: Recent Labs    01/12/21 0100  CHOL 250*  HDL 112  LDLCALC 130*  TRIG 42  CHOLHDL 2.2   Thyroid Function Tests: No results for input(s): TSH, T4TOTAL, FREET4, T3FREE, THYROIDAB in the last 72 hours. Anemia Panel: Recent Labs    01/11/21 2056  VITAMINB12 194   Sepsis Labs: No results for input(s): PROCALCITON, LATICACIDVEN in the last 168 hours.  Recent Results (from the past 240 hour(s))  SARS CORONAVIRUS 2 (TAT 6-24 HRS) Nasopharyngeal     Status: None   Collection Time: 01/11/21  4:55 PM   Specimen: Nasopharyngeal  Result Value Ref Range Status   SARS Coronavirus 2 NEGATIVE NEGATIVE Final    Comment: (NOTE) SARS-CoV-2 target nucleic acids are NOT DETECTED.  The SARS-CoV-2 RNA is generally detectable in upper and lower respiratory specimens during the acute phase of infection. Negative results do not preclude SARS-CoV-2 infection, do not rule out co-infections with other pathogens, and should not  be used as the sole basis for treatment or other patient management decisions. Negative results must be combined with clinical observations, patient history, and epidemiological information. The expected result is Negative.  Fact Sheet for Patients: SugarRoll.be  Fact Sheet for Healthcare Providers: https://www.woods-mathews.com/  This test is not yet approved or cleared by the [place] FDA and  has been authorized for detection and/or diagnosis of SARS-CoV-2 by FDA under an Emergency Use Authorization (EUA). This EUA will remain  in effect (meaning this test can be used) for the duration of the COVID-19 declaration under Se ction 564(b)(1) of the Act, 21 U.S.C. section 360bbb-3(b)(1), unless the authorization is terminated or revoked sooner.  Performed at [facility], [place] [address] [place], [place] [address]   MRSA Next Gen by PCR, Nasal  Status: None   Collection Time: 01/11/21  8:42 PM   Specimen: Nasal Mucosa; Nasal Swab  Result Value Ref Range Status   MRSA by PCR Next Gen NOT DETECTED NOT DETECTED Final    Comment: (NOTE) The GeneXpert MRSA Assay (FDA approved for NASAL specimens only), is one component of a comprehensive MRSA colonization surveillance program. It is not intended to diagnose MRSA infection nor to guide or monitor treatment for MRSA infections. Test performance is not FDA approved in patients less than 86 years old. Performed at [facility], [place] [address] [place], [place] [address]          Radiology Studies: CT ANGIO HEAD W OR WO CONTRAST  Result Date: 01/12/2021 : CLINICAL DATA:   Intracranial hemorrhage EXAM: CT ANGIOGRAPHY HEAD AND NECK TECHNIQUE: Multidetector CT imaging of the head and neck was performed using the standard protocol during bolus administration of intravenous contrast. Multiplanar CT image reconstructions and MIPs were obtained to evaluate the vascular anatomy.  Carotid stenosis measurements (when applicable) are obtained utilizing NASCET criteria, using the distal internal carotid diameter as the denominator. CONTRAST:   76mL OMNIPAQUE IOHEXOL 350 MG/ML SOLN COMPARISON:   MRI head and CT head 01/11/2021 FINDINGS: CT HEAD FINDINGS Brain: Left occipital high-density hematoma unchanged in size measuring approximately 3.7 x 2.4 cm. Mild surrounding edema. Mild adjacent subarachnoid hemorrhage is unchanged. Ventricle size normal. No midline shift Moderate white matter hypodensity diffusely is chronic and unchanged from prior studies. This is most likely due to chronic microvascular ischemia. No acute ischemic infarct or mass Vascular: Negative for hyperdense vessel Skull: Negative Sinuses: Mucosal edema throughout the paranasal sinuses. Air-fluid level right frontal sinus and sphenoid sinus. Complete opacification right maxillary sinus. Bony thickening of the maxillary sinus bilaterally in the sphenoid sinus. Mastoid sinus clear on the right with mild left mastoid effusion. Orbits: Negative Review of the MIP images confirms the above findings CTA NECK FINDINGS Aortic arch: Standard branching. Imaged portion shows no evidence of aneurysm or dissection. No significant stenosis of the major arch vessel origins. Atherosclerotic calcification in the aortic arch and proximal left subclavian artery Right carotid system: Mild atherosclerotic disease right carotid bifurcation. No right carotid stenosis or dissection Left carotid system: Mild atherosclerotic calcification left carotid bifurcation. Negative for stenosis or dissection Vertebral arteries: Right vertebral artery dominant and widely patent to the basilar. Small left vertebral artery with small contribution to the basilar. Skeleton: Cervical spondylosis without acute abnormality. Other neck: Negative for mass or adenopathy in the neck Upper chest: Mild apical scarring bilaterally. Remaining lung apices clear. Review of the MIP  images confirms the above findings CTA HEAD FINDINGS Anterior circulation: Atherosclerotic calcification in the cavernous carotid on the right with mild stenosis of the supraclinoid segment. Mild atherosclerotic disease left cavernous carotid without stenosis. Anterior and middle cerebral arteries widely patent. No stenosis, large vessel occlusion, or vascular malformation Posterior circulation: Right vertebral artery is widely patent with mild atherosclerotic distally. Right vertebral artery is dominant. Small left vertebral artery. Basilar is widely patent. Superior cerebellar and posterior cerebral arteries are patent. No large vessel occlusion. Negative for vascular malformation related to the left occipital hematoma. Venous sinuses: Normal vascular enhancement Anatomic variants: None Review of the MIP images confirms the above findings IMPRESSION: 1. Left occipital hematoma unchanged in size. Small adjacent subarachnoid hemorrhage unchanged. Differential diagnosis includes hypertensive hemorrhage versus cerebral amyloid. No underlying vascular malformation on CTA 2. No significant carotid or vertebral artery stenosis in the neck. Electronically  Signed By: Franchot Gallo M.D. On: 01/12/2021 13:48 Electronically Signed   By: Franchot Gallo M.D.   On: 01/12/2021 14:09   CT ANGIO NECK W OR WO CONTRAST  Result Date: 01/12/2021 CLINICAL DATA:  Intracranial hemorrhage EXAM: CT ANGIOGRAPHY HEAD AND NECK TECHNIQUE: Multidetector CT imaging of the head and neck was performed using the standard protocol during bolus administration of intravenous contrast. Multiplanar CT image reconstructions and MIPs were obtained to evaluate the vascular anatomy. Carotid stenosis measurements (when applicable) are obtained utilizing NASCET criteria, using the distal internal carotid diameter as the denominator. CONTRAST:  40mL OMNIPAQUE IOHEXOL 350 MG/ML SOLN COMPARISON:  MRI head and CT head 01/11/2021 FINDINGS: CT HEAD FINDINGS  Brain: Left occipital high-density hematoma unchanged in size measuring approximately 3.7 x 2.4 cm. Mild surrounding edema. Mild adjacent subarachnoid hemorrhage is unchanged. Ventricle size normal. No midline shift Moderate white matter hypodensity diffusely is chronic and unchanged from prior studies. This is most likely due to chronic microvascular ischemia. No acute ischemic infarct or mass Vascular: Negative for hyperdense vessel Skull: Negative Sinuses: Mucosal edema throughout the paranasal sinuses. Air-fluid level right frontal sinus and sphenoid sinus. Complete opacification right maxillary sinus. Bony thickening of the maxillary sinus bilaterally in the sphenoid sinus. Mastoid sinus clear on the right with mild left mastoid effusion. Orbits: Negative Review of the MIP images confirms the above findings CTA NECK FINDINGS Aortic arch: Standard branching. Imaged portion shows no evidence of aneurysm or dissection. No significant stenosis of the major arch vessel origins. Atherosclerotic calcification in the aortic arch and proximal left subclavian artery Right carotid system: Mild atherosclerotic disease right carotid bifurcation. No right carotid stenosis or dissection Left carotid system: Mild atherosclerotic calcification left carotid bifurcation. Negative for stenosis or dissection Vertebral arteries: Right vertebral artery dominant and widely patent to the basilar. Small left vertebral artery with small contribution to the basilar. Skeleton: Cervical spondylosis without acute abnormality. Other neck: Negative for mass or adenopathy in the neck Upper chest: Mild apical scarring bilaterally. Remaining lung apices clear. Review of the MIP images confirms the above findings CTA HEAD FINDINGS Anterior circulation: Atherosclerotic calcification in the cavernous carotid on the right with mild stenosis of the supraclinoid segment. Mild atherosclerotic disease left cavernous carotid without stenosis. Anterior and  middle cerebral arteries widely patent. No stenosis, large vessel occlusion, or vascular malformation Posterior circulation: Right vertebral artery is widely patent with mild atherosclerotic distally. Right vertebral artery is dominant. Small left vertebral artery. Basilar is widely patent. Superior cerebellar and posterior cerebral arteries are patent. No large vessel occlusion. Negative for vascular malformation related to the left occipital hematoma. Venous sinuses: Normal vascular enhancement Anatomic variants: None Review of the MIP images confirms the above findings IMPRESSION: 1. Left occipital hematoma unchanged in size. Small adjacent subarachnoid hemorrhage unchanged. Differential diagnosis includes hypertensive hemorrhage versus cerebral amyloid. No underlying vascular malformation on CTA 2. No significant carotid or vertebral artery stenosis in the neck. Electronically Signed   By: Franchot Gallo M.D.   On: 01/12/2021 13:48   ECHOCARDIOGRAM COMPLETE  Result Date: 01/13/2021    ECHOCARDIOGRAM REPORT   Patient Name:   Shae Coward Date of Exam: 01/13/2021 Medical Rec #:  [id number]     Height:       73.0 in Accession #:    [id number]    Weight:       170.0 lb Date of Birth:  04/20/1945      BSA:  2.008 m Patient Age:    38 years      BP:           123/69 mmHg Patient Gender: M             HR:           72 bpm. Exam Location:  Inpatient Procedure: 2D Echo, Cardiac Doppler and Color Doppler Indications:    Stroke I63.9  History:        Patient has prior history of Echocardiogram examinations, most                 recent 02/03/2016.  Sonographer:    Merrie Roof RDCS Referring Phys: [place]  1. Left ventricular ejection fraction, by estimation, is 55 to 60%. The left ventricle has normal function. Left ventricular endocardial border not optimally defined to evaluate regional wall motion. Left ventricular diastolic parameters are consistent with Grade I diastolic  dysfunction (impaired relaxation).  2. Right ventricular systolic function is normal. The right ventricular size is normal. Tricuspid regurgitation signal is inadequate for assessing PA pressure.  3. The mitral valve is grossly normal. No evidence of mitral valve regurgitation. No evidence of mitral stenosis.  4. The aortic valve is calcified. Aortic valve regurgitation is trivial. Mild aortic valve stenosis. Aortic valve mean gradient measures 15.0 mmHg. Aortic valve Vmax measures 2.57 m/s. Decreased DVI 0.30.  5. The inferior vena cava is normal in size with greater than 50% respiratory variability, suggesting right atrial pressure of 3 mmHg. Comparison(s): A prior study was performed on 02/03/2016. Increase in aortic valve calcium and gradients. FINDINGS  Left Ventricle: Left ventricular ejection fraction, by estimation, is 55 to 60%. The left ventricle has normal function. Left ventricular endocardial border not optimally defined to evaluate regional wall motion. The left ventricular internal cavity size was normal in size. There is no left ventricular hypertrophy. Left ventricular diastolic parameters are consistent with Grade I diastolic dysfunction (impaired relaxation). Right Ventricle: The right ventricular size is normal. No increase in right ventricular wall thickness. Right ventricular systolic function is normal. Tricuspid regurgitation signal is inadequate for assessing PA pressure. Left Atrium: Left atrial size was normal in size. Right Atrium: Right atrial size was normal in size. Pericardium: There is no evidence of pericardial effusion. Mitral Valve: The mitral valve is grossly normal. There is mild thickening of the mitral valve leaflet(s). No evidence of mitral valve regurgitation. No evidence of mitral valve stenosis. Tricuspid Valve: The tricuspid valve is normal in structure. Tricuspid valve regurgitation is trivial. No evidence of tricuspid stenosis. Aortic Valve: The aortic valve is  calcified. Aortic valve regurgitation is trivial. Mild aortic stenosis is present. Aortic valve mean gradient measures 15.0 mmHg. Aortic valve peak gradient measures 26.4 mmHg. Aortic valve area, by VTI measures 0.93 cm. Pulmonic Valve: The pulmonic valve was not well visualized. Pulmonic valve regurgitation is not visualized. Aorta: The aortic root is normal in size and structure. Venous: The inferior vena cava is normal in size with greater than 50% respiratory variability, suggesting right atrial pressure of 3 mmHg. IAS/Shunts: The atrial septum is grossly normal.  LEFT VENTRICLE PLAX 2D LVIDd:         5.00 cm  Diastology LVIDs:         3.20 cm  LV e' medial:    6.31 cm/s LV PW:         1.00 cm  LV E/e' medial:  8.6 LV IVS:  0.90 cm  LV e' lateral:   7.72 cm/s LVOT diam:     2.00 cm  LV E/e' lateral: 7.0 LV SV:         47 LV SV Index:   24 LVOT Area:     3.14 cm  RIGHT VENTRICLE RV Basal diam:  3.30 cm LEFT ATRIUM             Index       RIGHT ATRIUM           Index LA diam:        3.40 cm 1.69 cm/m  RA Area:     22.50 cm LA Vol (A2C):   70.5 ml 35.11 ml/m RA Volume:   70.40 ml  35.06 ml/m LA Vol (A4C):   41.6 ml 20.72 ml/m LA Biplane Vol: 54.2 ml 26.99 ml/m  AORTIC VALVE AV Area (Vmax):    0.93 cm AV Area (Vmean):   0.92 cm AV Area (VTI):     0.93 cm AV Vmax:           257.00 cm/s AV Vmean:          180.000 cm/s AV VTI:            0.508 m AV Peak Grad:      26.4 mmHg AV Mean Grad:      15.0 mmHg LVOT Vmax:         76.05 cm/s LVOT Vmean:        52.500 cm/s LVOT VTI:          0.150 m LVOT/AV VTI ratio: 0.30  AORTA Ao Root diam: 3.40 cm MITRAL VALVE MV Area (PHT): 2.91 cm    SHUNTS MV Decel Time: 261 msec    Systemic VTI:  0.15 m MV E velocity: 54.00 cm/s  Systemic Diam: 2.00 cm MV A velocity: 78.00 cm/s MV E/A ratio:  0.69 Rudean Haskell MD Electronically signed by Rudean Haskell MD Signature Date/Time: 01/13/2021/3:28:47 PM    Final         Scheduled Meds:  Chlorhexidine  Gluconate Cloth  6 each Topical Daily   irbesartan  150 mg Oral Daily   pantoprazole (PROTONIX) IV  40 mg Intravenous QHS   senna-docusate  1 tablet Oral BID   Continuous Infusions:   LOS: 3 days   Time spent= 35 mins    Jaxn Chiquito Arsenio Loader, MD [facility]  If 7PM-7AM, please contact night-coverage  01/14/2021, 7:26 AM

## 2021-01-15 DIAGNOSIS — I10 Essential (primary) hypertension: Secondary | ICD-10-CM | POA: Diagnosis not present

## 2021-01-15 DIAGNOSIS — I619 Nontraumatic intracerebral hemorrhage, unspecified: Secondary | ICD-10-CM | POA: Diagnosis not present

## 2021-01-15 DIAGNOSIS — E7800 Pure hypercholesterolemia, unspecified: Secondary | ICD-10-CM

## 2021-01-15 DIAGNOSIS — I680 Cerebral amyloid angiopathy: Secondary | ICD-10-CM

## 2021-01-15 DIAGNOSIS — I611 Nontraumatic intracerebral hemorrhage in hemisphere, cortical: Secondary | ICD-10-CM | POA: Diagnosis not present

## 2021-01-15 LAB — CBC
HCT: 42 % (ref 39.0–52.0)
Hemoglobin: 14.2 g/dL (ref 13.0–17.0)
MCH: 33.3 pg (ref 26.0–34.0)
MCHC: 33.8 g/dL (ref 30.0–36.0)
MCV: 98.6 fL (ref 80.0–100.0)
Platelets: 190 10*3/uL (ref 150–400)
RBC: 4.26 MIL/uL (ref 4.22–5.81)
RDW: 12.2 % (ref 11.5–15.5)
WBC: 5.2 10*3/uL (ref 4.0–10.5)
nRBC: 0 % (ref 0.0–0.2)

## 2021-01-15 LAB — BASIC METABOLIC PANEL
Anion gap: 6 (ref 5–15)
BUN: 23 mg/dL (ref 8–23)
CO2: 27 mmol/L (ref 22–32)
Calcium: 9.2 mg/dL (ref 8.9–10.3)
Chloride: 103 mmol/L (ref 98–111)
Creatinine, Ser: 1.02 mg/dL (ref 0.61–1.24)
GFR, Estimated: >60 mL/min (ref >60)
Glucose, Bld: 97 mg/dL (ref 70–99)
Potassium: 4 mmol/L (ref 3.5–5.1)
Sodium: 136 mmol/L (ref 135–145)

## 2021-01-15 LAB — MAGNESIUM: Magnesium: 2.2 mg/dL (ref 1.7–2.4)

## 2021-01-15 MED ORDER — PANTOPRAZOLE SODIUM 40 MG PO TBEC
40.0000 mg | DELAYED_RELEASE_TABLET | Freq: Every day | ORAL | Status: DC
  Administered 2021-01-15 – 2021-01-16 (×2): 40 mg via ORAL
  Filled 2021-01-15 (×2): qty 1

## 2021-01-15 MED ORDER — EZETIMIBE 10 MG PO TABS
10.0000 mg | ORAL_TABLET | Freq: Every day | ORAL | Status: DC
  Administered 2021-01-15 – 2021-01-17 (×3): 10 mg via ORAL
  Filled 2021-01-15 (×3): qty 1

## 2021-01-15 MED ORDER — HEPARIN SODIUM (PORCINE) 5000 UNIT/ML IJ SOLN: 5000.0000 [IU] | Freq: Three times a day (TID) | INTRAMUSCULAR | Status: DC

## 2021-01-15 NOTE — Progress Notes (Signed)
Physical Therapy Treatment Patient Details Name: Lee Blevins MRN: [id number] DOB: November 15, 1945 Today's Date: 01/15/2021   History of Present Illness 75 yo admitted wtih confusion, aphasia and impaired vision. 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 PMH: hemorrhagic CVA, HTN, HLD, asthma.    PT Comments    Patient improving although still requires cues for scanning to his right to avoid objects (only one instance of running into an object with imbalance requiring assist).     Recommendations for follow up therapy are one component of a multi-disciplinary discharge planning process, led by the attending physician.  Recommendations may be updated based on patient status, additional functional criteria and insurance authorization.  Follow Up Recommendations  CIR     Equipment Recommendations  None recommended by PT    Recommendations for Other Services Rehab consult     Precautions / Restrictions Precautions Precautions: Fall Precaution Comments: BP <140, R visual field deficit, R inattention     Mobility  Bed Mobility Overal bed mobility: Needs Assistance Bed Mobility: Supine to Sit     Supine to sit: Supervision          Transfers Overall transfer level: Needs assistance Equipment used: None Transfers: Sit to/from Stand Sit to Stand: Min guard         General transfer comment: min guard for safety; slightly back on his heels  Ambulation/Gait Ambulation/Gait assistance: Min assist Gait Distance (Feet): 200 Feet Assistive device: None Gait Pattern/deviations: Step-through pattern;Decreased stride length;Staggering left;Staggering right;Drifts right/left Gait velocity: decreased   General Gait Details: minA for balance during scavenger hunt in hallway. Improved visual scanning during ambulation to find objects on right. ABle to call out obstacles in hallway  on his right and then avoid them except once in his room ran into computer stand with imbalance occuring.   Stairs             Wheelchair Mobility    Modified Rankin (Stroke Patients Only) Modified Rankin (Stroke Patients Only) Pre-Morbid Rankin Score: No significant disability Modified Rankin: Moderately severe disability     Balance Overall balance assessment: Needs assistance Sitting-balance support: No upper extremity supported;Feet supported Sitting balance-Leahy Scale: Good     Standing balance support: No upper extremity supported;During functional activity Standing balance-Leahy Scale: Fair Standing balance comment: requires assist for safety and balance                            Cognition Arousal/Alertness: Awake/alert Behavior During Therapy: Anxious Overall Cognitive Status: Impaired/Different from baseline Area of Impairment: Following commands;Awareness;Problem solving;Memory                     Memory: Decreased short-term memory (forgot to compensate with head turns to rt) Following Commands: Follows one step commands with increased time;Follows one step commands consistently   Awareness: Emergent;Anticipatory Problem Solving: Slow processing;Requires verbal cues;Requires tactile cues General Comments: Able to describe his deficits and that he is not safe to get up and walk alone. Did not recall rt head turns for compensation, but once instructed, he consistently used.      Exercises General Exercises - Lower Extremity Hip Flexion/Marching: AROM;Both;20 reps;Standing (initially with light single UE support progressing to no UE support)    General Comments        Pertinent Vitals/Pain Pain Assessment: No/denies pain    Home Living Family/patient expects  to be discharged to:: Private residence Living Arrangements: Spouse/significant other Available Help at Discharge: Family Type of Home: House Home Access: Stairs to  enter   Home Layout: One [place];Shower seat;Cane - single point      Prior Function Level of Independence: Independent      Comments: drives, manages own medications and finances   PT Goals (current goals can now be found in the care plan section) Acute Rehab PT Goals Patient Stated Goal: to go home PT Goal Formulation: With patient Time For Goal Achievement: 01/26/21 Potential to Achieve Goals: Good Progress towards PT goals: Progressing toward goals    Frequency    Min 4X/week      PT Plan Current plan remains appropriate (although may progress to being able to go home with [facility])    Co-evaluation              AM-PAC PT "6 Clicks" Mobility   Outcome Measure  Help needed turning from your back to your side while in a flat bed without using bedrails?: A Little Help needed moving from lying on your back to sitting on the side of a flat bed without using bedrails?: A Little Help needed moving to and from a bed to a chair (including a wheelchair)?: A Little Help needed standing up from a chair using your arms (e.g., wheelchair or bedside chair)?: A Little Help needed to walk in hospital room?: A Little Help needed climbing 3-5 steps with a railing? : A Little 6 Click Score: 18    End of Session Equipment Utilized During Treatment: Gait belt Activity Tolerance: Patient tolerated treatment well Patient left: in chair;with call bell/phone within reach (no alarm in his room; able to verbalize to call if he wants to get out of chair) Nurse Communication: Mobility status PT Visit Diagnosis: Unsteadiness on feet (R26.81);Muscle weakness (generalized) (M62.81)     Time: 8889-1694 PT Time Calculation (min) (ACUTE ONLY): 17 min  Charges:  $Gait Training: 8-22 mins                      Arby Barrette, PT Pager [phone]    Rexanne Mano 01/15/2021, 4:04 PM

## 2021-01-15 NOTE — Progress Notes (Addendum)
STROKE TEAM PROGRESS NOTE   ATTENDING NOTE: I reviewed above note and agree with the assessment and plan. Pt was seen and examined.   75 year old male with history of hypertension, hyperlipidemia, alcohol use, left parietal ICH in 03/2010 admitted for confusion, aphasia and vision changes.  CT showed left occipital ICH.  CT head and neck unremarkable.  CT repeat showed stable hematoma.  MRI showed left occipital ICH and chronic left parietal ICH, and acute left CR small infarct.  EF 55 to 60%, A1c 5.2, LDL 130.  B12 194, UDS negative.  Creatinine 0.85.  On exam, patient sitting in chair, no family around.  Awake alert, orientated x3, mild psychomotor slowing, no aphasia, paucity of speech, follows simple commands, able to name and repeat.  Right hemianopia, facial symmetrical, no gaze palsy.  Moving all extremities symmetrically, sensation symmetrical, finger-to-nose intact.  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  His BP in good control with minimal BP meds, less likely hypertensive bleeding.  Left CR small infarct likely small vessel disease.  However, given current ICH and probable CAA, recommend against antiplatelet or anticoagulation at this time.  BP goal less than 140.  Patient has statin intolerance, will start Zetia for hyperlipidemia.  PT/OT recommend CIR.  For detailed assessment and plan, please refer to above as I have made changes wherever appropriate.   Neurology will sign off. Please call with questions. Pt will follow up with stroke clinic NP at [facility] in about 4 weeks. Thanks for the consult.   Lee Hawking, MD PhD Stroke Neurology 01/15/2021 6:10 PM    INTERVAL HISTORY No acute events. Afebrile. Wife and son at bedside.  Speech continues to improve, his efforts are slower and more controlled. He remains with right sided visual deficit.  Vital signs stable.  Blood pressure adequately controlled. 111-153/  52-81. He is anxious to go to CIR. Wife and son at bedside. Wife was worried plan for CIR had changed but recs remain for CIR and he is awaiting on open bed per chart review. Her questions were answered.  Vitals:   01/13/21 0400 01/13/21 0500 01/13/21 0600 01/13/21 0800  BP: 135/66 121/63 120/62 (!) 142/79  Pulse: 63 (!) 59 61 74  Resp: 13 15 15 17   Temp: 98.3 F (36.8 C)     TempSrc: Oral     SpO2: (!) 89% 96% 93% 93%  Weight:      Height:       CBC:  Recent Labs  Lab 01/11/21 1552 01/11/21 1601 01/12/21 0100  WBC 5.9  --  6.0  NEUTROABS 4.8  --   --   HGB 14.6 15.3 13.5  HCT 44.1 45.0 40.0  MCV 100.2*  --  98.8  PLT 193  --  852   Basic Metabolic Panel:  Recent Labs  Lab 01/11/21 1552 01/11/21 1601 01/12/21 0100  NA 143 135 136  K 4.5 4.3 3.6  CL 107 102 104  CO2 28  --  26  GLUCOSE 146* 143* 109*  BUN 17 14 11   CREATININE 0.90 0.80 0.85  CALCIUM 10.0  --  9.0   Lipid Panel:  Recent Labs  Lab 01/12/21 0100  CHOL 250*  TRIG 42  HDL 112  CHOLHDL 2.2  VLDL 8  LDLCALC 130*   HgbA1c:  Recent Labs  Lab 01/12/21 0100  HGBA1C 5.2   Urine Drug Screen:  Recent Labs  Lab 01/11/21 1656  LABOPIA NONE DETECTED  COCAINSCRNUR NONE DETECTED  LABBENZ NONE DETECTED  AMPHETMU NONE DETECTED  THCU NONE DETECTED  LABBARB NONE DETECTED    Alcohol Level  Recent Labs  Lab 01/11/21 1552  ETH <10    IMAGING past 24 hours CT ANGIO HEAD W OR WO CONTRAST  Result Date: 01/12/2021 : CLINICAL DATA:   Intracranial hemorrhage EXAM: CT ANGIOGRAPHY HEAD AND NECK TECHNIQUE: Multidetector CT imaging of the head and neck was performed using the standard protocol during bolus administration of intravenous contrast. Multiplanar CT image reconstructions and MIPs were obtained to evaluate the vascular anatomy. Carotid stenosis measurements (when applicable) are obtained utilizing NASCET criteria, using the distal internal carotid diameter as the denominator. CONTRAST:   49mL  OMNIPAQUE IOHEXOL 350 MG/ML SOLN COMPARISON:   MRI head and CT head 01/11/2021 FINDINGS: CT HEAD FINDINGS Brain: Left occipital high-density hematoma unchanged in size measuring approximately 3.7 x 2.4 cm. Mild surrounding edema. Mild adjacent subarachnoid hemorrhage is unchanged. Ventricle size normal. No midline shift Moderate white matter hypodensity diffusely is chronic and unchanged from prior studies. This is most likely due to chronic microvascular ischemia. No acute ischemic infarct or mass Vascular: Negative for hyperdense vessel Skull: Negative Sinuses: Mucosal edema throughout the paranasal sinuses. Air-fluid level right frontal sinus and sphenoid sinus. Complete opacification right maxillary sinus. Bony thickening of the maxillary sinus bilaterally in the sphenoid sinus. Mastoid sinus clear on the right with mild left mastoid effusion. Orbits: Negative Review of the MIP images confirms the above findings CTA NECK FINDINGS Aortic arch: Standard branching. Imaged portion shows no evidence of aneurysm or dissection. No significant stenosis of the major arch vessel origins. Atherosclerotic calcification in the aortic arch and proximal left subclavian artery Right carotid system: Mild atherosclerotic disease right carotid bifurcation. No right carotid stenosis or dissection Left carotid system: Mild atherosclerotic calcification left carotid bifurcation. Negative for stenosis or dissection Vertebral arteries: Right vertebral artery dominant and widely patent to the basilar. Small left vertebral artery with small contribution to the basilar. Skeleton: Cervical spondylosis without acute abnormality. Other neck: Negative for mass or adenopathy in the neck Upper chest: Mild apical scarring bilaterally. Remaining lung apices clear. Review of the MIP images confirms the above findings CTA HEAD FINDINGS Anterior circulation: Atherosclerotic calcification in the cavernous carotid on the right with mild stenosis of  the supraclinoid segment. Mild atherosclerotic disease left cavernous carotid without stenosis. Anterior and middle cerebral arteries widely patent. No stenosis, large vessel occlusion, or vascular malformation Posterior circulation: Right vertebral artery is widely patent with mild atherosclerotic distally. Right vertebral artery is dominant. Small left vertebral artery. Basilar is widely patent. Superior cerebellar and posterior cerebral arteries are patent. No large vessel occlusion. Negative for vascular malformation related to the left occipital hematoma. Venous sinuses: Normal vascular enhancement Anatomic variants: None Review of the MIP images confirms the above findings IMPRESSION: 1. Left occipital hematoma unchanged in size. Small adjacent subarachnoid hemorrhage unchanged. Differential diagnosis includes hypertensive hemorrhage versus cerebral amyloid. No underlying vascular malformation on CTA 2. No significant carotid or vertebral artery stenosis in the neck. Electronically Signed By: Franchot Gallo M.D. On: 01/12/2021 13:48 Electronically Signed   By: Franchot Gallo M.D.   On: 01/12/2021 14:09   CT ANGIO NECK W OR WO CONTRAST  Result Date: 01/12/2021 CLINICAL DATA:  Intracranial hemorrhage EXAM: CT ANGIOGRAPHY HEAD AND NECK TECHNIQUE: Multidetector CT imaging of the head and neck was performed using the standard protocol during bolus administration of intravenous contrast. Multiplanar  CT image reconstructions and MIPs were obtained to evaluate the vascular anatomy. Carotid stenosis measurements (when applicable) are obtained utilizing NASCET criteria, using the distal internal carotid diameter as the denominator. CONTRAST:  55mL OMNIPAQUE IOHEXOL 350 MG/ML SOLN COMPARISON:  MRI head and CT head 01/11/2021 FINDINGS: CT HEAD FINDINGS Brain: Left occipital high-density hematoma unchanged in size measuring approximately 3.7 x 2.4 cm. Mild surrounding edema. Mild adjacent subarachnoid hemorrhage is  unchanged. Ventricle size normal. No midline shift Moderate white matter hypodensity diffusely is chronic and unchanged from prior studies. This is most likely due to chronic microvascular ischemia. No acute ischemic infarct or mass Vascular: Negative for hyperdense vessel Skull: Negative Sinuses: Mucosal edema throughout the paranasal sinuses. Air-fluid level right frontal sinus and sphenoid sinus. Complete opacification right maxillary sinus. Bony thickening of the maxillary sinus bilaterally in the sphenoid sinus. Mastoid sinus clear on the right with mild left mastoid effusion. Orbits: Negative Review of the MIP images confirms the above findings CTA NECK FINDINGS Aortic arch: Standard branching. Imaged portion shows no evidence of aneurysm or dissection. No significant stenosis of the major arch vessel origins. Atherosclerotic calcification in the aortic arch and proximal left subclavian artery Right carotid system: Mild atherosclerotic disease right carotid bifurcation. No right carotid stenosis or dissection Left carotid system: Mild atherosclerotic calcification left carotid bifurcation. Negative for stenosis or dissection Vertebral arteries: Right vertebral artery dominant and widely patent to the basilar. Small left vertebral artery with small contribution to the basilar. Skeleton: Cervical spondylosis without acute abnormality. Other neck: Negative for mass or adenopathy in the neck Upper chest: Mild apical scarring bilaterally. Remaining lung apices clear. Review of the MIP images confirms the above findings CTA HEAD FINDINGS Anterior circulation: Atherosclerotic calcification in the cavernous carotid on the right with mild stenosis of the supraclinoid segment. Mild atherosclerotic disease left cavernous carotid without stenosis. Anterior and middle cerebral arteries widely patent. No stenosis, large vessel occlusion, or vascular malformation Posterior circulation: Right vertebral artery is widely patent  with mild atherosclerotic distally. Right vertebral artery is dominant. Small left vertebral artery. Basilar is widely patent. Superior cerebellar and posterior cerebral arteries are patent. No large vessel occlusion. Negative for vascular malformation related to the left occipital hematoma. Venous sinuses: Normal vascular enhancement Anatomic variants: None Review of the MIP images confirms the above findings IMPRESSION: 1. Left occipital hematoma unchanged in size. Small adjacent subarachnoid hemorrhage unchanged. Differential diagnosis includes hypertensive hemorrhage versus cerebral amyloid. No underlying vascular malformation on CTA 2. No significant carotid or vertebral artery stenosis in the neck. Electronically Signed   By: Franchot Gallo M.D.   On: 01/12/2021 13:48    PHYSICAL EXAM  Temp:  [97.7 F (36.5 C)-98.7 F (37.1 C)] 97.7 F (36.5 C) (10/01 1211) Pulse Rate:  [52-74] 68 (10/01 1211) Resp:  [17-20] 17 (10/01 1211) BP: (111-153)/(52-81) 129/62 (10/01 1211) SpO2:  [90 %-95 %] 94 % (10/01 0807)  General - Well nourished, well developed, in no apparent distress.  Ophthalmologic - fundi not visualized due to noncooperation.  Cardiovascular - Regular rhythm and rate.  Mental Status -  Level of arousal and orientation to time, place, and person were intact. Language is aphasic with word finding difficulty. Impaired repetition. Naming is impaired.   Attention span and concentration were normal. Recent and remote memory were intact. Fund of Knowledge was assessed and was intact.  Cranial Nerves II - XII - II - Visual field impaired with dense right homonymous hemianopsia.  III, IV, VI - Extraocular  movements intact. V - Facial sensation intact bilaterally. VII - Facial movement intact bilaterally. VIII - Hearing & vestibular intact bilaterally. X - Palate elevates symmetrically. XI - Chin turning & shoulder shrug intact bilaterally. XII - Tongue protrusion intact.  Motor  Strength - The patient's strength was normal in all extremities and pronator drift was absent.  Bulk was normal and fasciculations were absent.   Motor Tone - Muscle tone was assessed at the neck and appendages and was normal.  Sensory - Light touch, temperature/pinprick were assessed and were symmetrical.    Coordination - The patient had normal movements in the hands and feet with no ataxia or dysmetria.  Tremor was absent.  Gait and Station - deferred.   ASSESSMENT/PLAN [name] is a 75 y.o. male with PMH significant for hemorrhagic CVA, HTN, HLD, asthma admitted wtih confusion, aphasia and impaired vision. 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 Hypertensive ICH can not be ruled out but less likely given BP in good control.  Stroke - Small area of acute ischemia in the left centrum semiovale likely from small vessel disease  Code Stroke CT head  Area of acute hemorrhage within the left occipital lobe measuring up to 3.7 cm. Atrophy, chronic small vessel disease Repeat CT head Unchanged appearance of left occipital intraparenchymal hematoma with mild surrounding edema. Unchanged appearance of multiple old infarcts and chronic microvascular ischemia. CTA head & neck  Left occipital hematoma unchanged in size. Small adjacent subarachnoid hemorrhage unchanged.No underlying vascular malformation on CTA No significant carotid or vertebral artery stenosis in the neck MRI  Unchanged size of left occipital intraparenchymal hematoma allowing for differences in modality. Small area of acute ischemia in the left centrum semiovale. Chronic microvascular ischemia and volume loss.   2D Echo  EF 55-60%, Left ventricular diastolic parameters are consistent  with Grade I diastolic dysfunction,  No thrombus, wall motion abnormality or shunt found.     LDL 130 HgbA1c 5.6 VTE prophylaxis - will start heparin sq for DVT PPX today Not on anticoagulant or antiplatelets prior to admission NO ANTIPLATELET OR ANTICOAGULANTS Obtain stat head CT for any neurologic decline  Therapy recommendations:  CIR Disposition:  CIR Plan to transfer out of ICU today and to hospitalist team per protocol.   Hypertension Home meds:  Benicar, Avapro substituted here.  BP moderately high on admission in 150s, off cardene infusion BP systolic goal less than 382 Continue prn hydralazine, labetolol (discussed bystolic listed allergy with pharmacist, deemed low risk)  Long-term BP goal normotensive  Hyperlipidemia Home meds:  None LDL 130, not at goal < 70 Statin intolerance, will put on zetia         Nutrition On regular diet, tolerating well  Other Stroke Risk Factors Advanced Age >/= 39  Former Cigarette smoker, quit 40 years ago  Hx of ICH  Current ETOH use of 3 glasses of wine per night   Other [facility] day # 2 Dr. Erlinda Hong directed the plan of care.  Charlene Brooke, NP-C

## 2021-01-15 NOTE — Plan of Care (Signed)
  Problem: Coping: Goal: Level of anxiety will decrease Outcome: Progressing   Problem: Elimination: Goal: Will not experience complications related to bowel motility Outcome: Progressing Goal: Will not experience complications related to urinary retention Outcome: Progressing   Problem: Safety: Goal: Ability to remain free from injury will improve Outcome: Progressing   

## 2021-01-15 NOTE — Progress Notes (Signed)
PROGRESS NOTE    Lee Blevins  DDU:[id number] DOB: 08/24/1945 DOA: 01/11/2021 PCP: Janith Lima, MD   Brief Narrative:  75 year old with history of hemorrhagic CVA, HLD, HTN, asthma who presented with confusion, visual disturbance and aphasia.  MRI was positive for left occipital intraparenchymal hemorrhage with acute ischemic stroke to the left central ovale along with chronic hemorrhage over posterior left lobe.  Patient was admitted to the ICU under neurology service and transferred to [facility].  PT recommended CIR, arrangements being made   Assessment & Plan:   Active Problems:   Hemorrhagic stroke (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  Repeat scans were unchanged.  -LDL 130, A1c 5.6 - Avoid antiplatelets and anticoagulation - Repeat CT head there is a status change - Echocardiogram-EF 55 to 60%, grade 1 DD -PT/OT-CIR.  Ongoing arrangements by [facility]  Essential hypertension - Systolic goal was less than 160. Avapro 150mg  po daily. IV Hydralazine abd labetalol  prn  Hyperlipidemia - LDL 130.  Lipitor 40 mg.  Discharge  History of asthma -As needed bronchodilators    DVT prophylaxis: SCD's Start: 01/11/21 1921  Code Status: Full code Family Communication:   Status is: Inpatient  Remains inpatient appropriate because:Unsafe d/c plan  Dispo: The patient is from: Home              Anticipated d/c is to: CIR              Patient currently is medically stable to d/c.  Awaiting bed at CIR   Difficult to place patient No       Nutritional status           Body mass index is 22.43 kg/m.           Subjective: Still some right-sided visual disturbance but otherwise no acute events overnight.  Seems like his speech is slowly improving  Review of Systems Otherwise negative except as per  HPI, including: General: Denies fever, chills, night sweats or unintended weight loss. Resp: Denies cough, wheezing, shortness of breath. Cardiac: Denies chest pain, palpitations, orthopnea, paroxysmal nocturnal dyspnea. GI: Denies abdominal pain, nausea, vomiting, diarrhea or constipation GU: Denies dysuria, frequency, hesitancy or incontinence MS: Denies muscle aches, joint pain or swelling Neuro: Denies headache, neurologic deficits (focal weakness, numbness, tingling), abnormal gait Psych: Denies anxiety, depression, SI/HI/AVH Skin: Denies new rashes or lesions ID: Denies sick contacts, exotic exposures, travel  Examination:  Constitutional: Not in acute distress Respiratory: Clear to auscultation bilaterally Cardiovascular: Normal sinus rhythm, no rubs Abdomen: Nontender nondistended good bowel sounds Musculoskeletal: No edema noted Skin: No rashes seen Neurologic: CN 2-12 grossly intact.  And nonfocal Psychiatric: Normal judgment and insight. Alert and oriented x 3. Normal mood.   Objective: Vitals:   01/14/21 1959 01/14/21 2335 01/15/21 0400 01/15/21 0807  BP: (!) 153/81 123/69 (!) 111/52 123/65  Pulse: 69 60 (!) 52 65  Resp: 20 18 17 18   Temp: 98.7 F (37.1 C) 98.5 F (36.9 C) 98.5 F (36.9 C) 97.9 F (36.6 C)  TempSrc: Oral Oral Oral Oral  SpO2: 95% 92% 90% 94%  Weight:      Height:        Intake/Output Summary (Last 24 hours) at 01/15/2021 0947 Last data filed at 01/15/2021 0809 Gross per 24 hour  Intake --  Output 1150 ml  Net -1150 ml  Filed Weights   01/11/21 1533  Weight: 77.1 kg     Data Reviewed:   CBC: Recent Labs  Lab 01/11/21 1552 01/11/21 1601 01/12/21 0100 01/15/21 0029  WBC 5.9  --  6.0 5.2  NEUTROABS 4.8  --   --   --   HGB 14.6 15.3 13.5 14.2  HCT 44.1 45.0 40.0 42.0  MCV 100.2*  --  98.8 98.6  PLT 193  --  182 614   Basic Metabolic Panel: Recent Labs  Lab 01/11/21 1552 01/11/21 1601 01/12/21 0100 01/15/21 0029  NA  143 135 136 136  K 4.5 4.3 3.6 4.0  CL 107 102 104 103  CO2 28  --  26 27  GLUCOSE 146* 143* 109* 97  BUN 17 14 11 23   CREATININE 0.90 0.80 0.85 1.02  CALCIUM 10.0  --  9.0 9.2  MG  --   --   --  2.2   GFR: Estimated Creatinine Clearance: 66.1 mL/min (by C-G formula based on SCr of 1.02 mg/dL). Liver Function Tests: Recent Labs  Lab 01/11/21 1552 01/12/21 0100  AST 22 19  ALT 14 13  ALKPHOS 55 52  BILITOT 1.0 1.4*  PROT 7.4 6.4*  ALBUMIN 4.0 3.3*   No results for input(s): LIPASE, AMYLASE in the last 168 hours. No results for input(s): AMMONIA in the last 168 hours. Coagulation Profile: Recent Labs  Lab 01/11/21 1552  INR 1.0   Cardiac Enzymes: No results for input(s): CKTOTAL, CKMB, CKMBINDEX, TROPONINI in the last 168 hours. BNP (last 3 results) No results for input(s): PROBNP in the last 8760 hours. HbA1C: No results for input(s): HGBA1C in the last 72 hours.  CBG: No results for input(s): GLUCAP in the last 168 hours. Lipid Profile: No results for input(s): CHOL, HDL, LDLCALC, TRIG, CHOLHDL, LDLDIRECT in the last 72 hours.  Thyroid Function Tests: No results for input(s): TSH, T4TOTAL, FREET4, T3FREE, THYROIDAB in the last 72 hours. Anemia Panel: No results for input(s): VITAMINB12, FOLATE, FERRITIN, TIBC, IRON, RETICCTPCT in the last 72 hours.  Sepsis Labs: No results for input(s): PROCALCITON, LATICACIDVEN in the last 168 hours.  Recent Results (from the past 240 hour(s))  SARS CORONAVIRUS 2 (TAT 6-24 HRS) Nasopharyngeal     Status: None   Collection Time: 01/11/21  4:55 PM   Specimen: Nasopharyngeal  Result Value Ref Range Status   SARS Coronavirus 2 NEGATIVE NEGATIVE Final    Comment: (NOTE) SARS-CoV-2 target nucleic acids are NOT DETECTED.  The SARS-CoV-2 RNA is generally detectable in upper and lower respiratory specimens during the acute phase of infection. Negative results do not preclude SARS-CoV-2 infection, do not rule out co-infections  with other pathogens, and should not be used as the sole basis for treatment or other patient management decisions. Negative results must be combined with clinical observations, patient history, and epidemiological information. The expected result is Negative.  Fact Sheet for Patients: SugarRoll.be  Fact Sheet for Healthcare Providers: https://www.woods-mathews.com/  This test is not yet approved or cleared by the [place] FDA and  has been authorized for detection and/or diagnosis of SARS-CoV-2 by FDA under an Emergency Use Authorization (EUA). This EUA will remain  in effect (meaning this test can be used) for the duration of the COVID-19 declaration under Se ction 564(b)(1) of the Act, 21 U.S.C. section 360bbb-3(b)(1), unless the authorization is terminated or revoked sooner.  Performed at [facility], [place] [address], [place], [place] [address]   MRSA Next Gen  by PCR, Nasal     Status: None   Collection Time: 01/11/21  8:42 PM   Specimen: Nasal Mucosa; Nasal Swab  Result Value Ref Range Status   MRSA by PCR Next Gen NOT DETECTED NOT DETECTED Final    Comment: (NOTE) The GeneXpert MRSA Assay (FDA approved for NASAL specimens only), is one component of a comprehensive MRSA colonization surveillance program. It is not intended to diagnose MRSA infection nor to guide or monitor treatment for MRSA infections. Test performance is not FDA approved in patients less than 77 years old. Performed at [facility], [place] [address], [place], [place] [address]          Radiology Studies: ECHOCARDIOGRAM COMPLETE  Result Date: 01/13/2021    ECHOCARDIOGRAM REPORT   Patient Name:   Fenix Viscomi Date of Exam: 01/13/2021 Medical Rec #:  [id number]     Height:       73.0 in Accession #:    [id number]    Weight:       170.0 lb Date of Birth:  02/01/1946      BSA:          2.008 m Patient Age:    49 years      BP:           123/69 mmHg  Patient Gender: M             HR:           72 bpm. Exam Location:  Inpatient Procedure: 2D Echo, Cardiac Doppler and Color Doppler Indications:    Stroke I63.9  History:        Patient has prior history of Echocardiogram examinations, most                 recent 02/03/2016.  Sonographer:    Merrie Roof RDCS Referring Phys: [place]  1. Left ventricular ejection fraction, by estimation, is 55 to 60%. The left ventricle has normal function. Left ventricular endocardial border not optimally defined to evaluate regional wall motion. Left ventricular diastolic parameters are consistent with Grade I diastolic dysfunction (impaired relaxation).  2. Right ventricular systolic function is normal. The right ventricular size is normal. Tricuspid regurgitation signal is inadequate for assessing PA pressure.  3. The mitral valve is grossly normal. No evidence of mitral valve regurgitation. No evidence of mitral stenosis.  4. The aortic valve is calcified. Aortic valve regurgitation is trivial. Mild aortic valve stenosis. Aortic valve mean gradient measures 15.0 mmHg. Aortic valve Vmax measures 2.57 m/s. Decreased DVI 0.30.  5. The inferior vena cava is normal in size with greater than 50% respiratory variability, suggesting right atrial pressure of 3 mmHg. Comparison(s): A prior study was performed on 02/03/2016. Increase in aortic valve calcium and gradients. FINDINGS  Left Ventricle: Left ventricular ejection fraction, by estimation, is 55 to 60%. The left ventricle has normal function. Left ventricular endocardial border not optimally defined to evaluate regional wall motion. The left ventricular internal cavity size was normal in size. There is no left ventricular hypertrophy. Left ventricular diastolic parameters are consistent with Grade I diastolic dysfunction (impaired relaxation). Right Ventricle: The right ventricular size is normal. No increase in right ventricular wall thickness. Right  ventricular systolic function is normal. Tricuspid regurgitation signal is inadequate for assessing PA pressure. Left Atrium: Left atrial size was normal in size. Right Atrium: Right atrial size was normal in size. Pericardium: There is no evidence of pericardial effusion. Mitral Valve: The  mitral valve is grossly normal. There is mild thickening of the mitral valve leaflet(s). No evidence of mitral valve regurgitation. No evidence of mitral valve stenosis. Tricuspid Valve: The tricuspid valve is normal in structure. Tricuspid valve regurgitation is trivial. No evidence of tricuspid stenosis. Aortic Valve: The aortic valve is calcified. Aortic valve regurgitation is trivial. Mild aortic stenosis is present. Aortic valve mean gradient measures 15.0 mmHg. Aortic valve peak gradient measures 26.4 mmHg. Aortic valve area, by VTI measures 0.93 cm. Pulmonic Valve: The pulmonic valve was not well visualized. Pulmonic valve regurgitation is not visualized. Aorta: The aortic root is normal in size and structure. Venous: The inferior vena cava is normal in size with greater than 50% respiratory variability, suggesting right atrial pressure of 3 mmHg. IAS/Shunts: The atrial septum is grossly normal.  LEFT VENTRICLE PLAX 2D LVIDd:         5.00 cm  Diastology LVIDs:         3.20 cm  LV e' medial:    6.31 cm/s LV PW:         1.00 cm  LV E/e' medial:  8.6 LV IVS:        0.90 cm  LV e' lateral:   7.72 cm/s LVOT diam:     2.00 cm  LV E/e' lateral: 7.0 LV SV:         47 LV SV Index:   24 LVOT Area:     3.14 cm  RIGHT VENTRICLE RV Basal diam:  3.30 cm LEFT ATRIUM             Index       RIGHT ATRIUM           Index LA diam:        3.40 cm 1.69 cm/m  RA Area:     22.50 cm LA Vol (A2C):   70.5 ml 35.11 ml/m RA Volume:   70.40 ml  35.06 ml/m LA Vol (A4C):   41.6 ml 20.72 ml/m LA Biplane Vol: 54.2 ml 26.99 ml/m  AORTIC VALVE AV Area (Vmax):    0.93 cm AV Area (Vmean):   0.92 cm AV Area (VTI):     0.93 cm AV Vmax:            257.00 cm/s AV Vmean:          180.000 cm/s AV VTI:            0.508 m AV Peak Grad:      26.4 mmHg AV Mean Grad:      15.0 mmHg LVOT Vmax:         76.05 cm/s LVOT Vmean:        52.500 cm/s LVOT VTI:          0.150 m LVOT/AV VTI ratio: 0.30  AORTA Ao Root diam: 3.40 cm MITRAL VALVE MV Area (PHT): 2.91 cm    SHUNTS MV Decel Time: 261 msec    Systemic VTI:  0.15 m MV E velocity: 54.00 cm/s  Systemic Diam: 2.00 cm MV A velocity: 78.00 cm/s MV E/A ratio:  0.69 Rudean Haskell MD Electronically signed by Rudean Haskell MD Signature Date/Time: 01/13/2021/3:28:47 PM    Final         Scheduled Meds:  irbesartan  150 mg Oral Daily   pantoprazole (PROTONIX) IV  40 mg Intravenous QHS   predniSONE  5 mg Oral Q breakfast   senna-docusate  1 tablet Oral BID   Continuous Infusions:   LOS: 4  days   Time spent= 35 mins    Orvill Coulthard Arsenio Loader, MD [facility]  If 7PM-7AM, please contact night-coverage  01/15/2021, 9:47 AM

## 2021-01-16 DIAGNOSIS — I619 Nontraumatic intracerebral hemorrhage, unspecified: Secondary | ICD-10-CM | POA: Diagnosis not present

## 2021-01-16 LAB — BASIC METABOLIC PANEL
Anion gap: 7 (ref 5–15)
BUN: 24 mg/dL — ABNORMAL HIGH (ref 8–23)
CO2: 25 mmol/L (ref 22–32)
Calcium: 9.7 mg/dL (ref 8.9–10.3)
Chloride: 105 mmol/L (ref 98–111)
Creatinine, Ser: 0.94 mg/dL (ref 0.61–1.24)
GFR, Estimated: >60 mL/min (ref >60)
Glucose, Bld: 102 mg/dL — ABNORMAL HIGH (ref 70–99)
Potassium: 4.3 mmol/L (ref 3.5–5.1)
Sodium: 137 mmol/L (ref 135–145)

## 2021-01-16 LAB — CBC
HCT: 43.3 % (ref 39.0–52.0)
Hemoglobin: 14.6 g/dL (ref 13.0–17.0)
MCH: 33 pg (ref 26.0–34.0)
MCHC: 33.7 g/dL (ref 30.0–36.0)
MCV: 98 fL (ref 80.0–100.0)
Platelets: 195 10*3/uL (ref 150–400)
RBC: 4.42 MIL/uL (ref 4.22–5.81)
RDW: 12 % (ref 11.5–15.5)
WBC: 5.7 10*3/uL (ref 4.0–10.5)
nRBC: 0 % (ref 0.0–0.2)

## 2021-01-16 LAB — MAGNESIUM: Magnesium: 2 mg/dL (ref 1.7–2.4)

## 2021-01-16 MED ORDER — HEPARIN SODIUM (PORCINE) 5000 UNIT/ML IJ SOLN: 5000.0000 [IU] | Freq: Three times a day (TID) | INTRAMUSCULAR | Status: DC

## 2021-01-16 NOTE — Progress Notes (Signed)
While going over Medications with patient and family, noted Pt has an order for Heparin, called on call physician to clarify order given the patient's history of ICH, hemorrhagic stroke. Order was Dc'd. Wife was notified  of the change as she was very concerned about him receiving anticoagulants.

## 2021-01-16 NOTE — Progress Notes (Signed)
1100: Family would like nursing to hold off on giving patient heparin dose until they speak with the doctor. MD notified and to call family later today to discuss.

## 2021-01-16 NOTE — Progress Notes (Signed)
PROGRESS NOTE    Lee Blevins  SHF:[id number] DOB: 10-19-1945 DOA: 01/11/2021 PCP: Janith Lima, MD   Brief Narrative:  75 year old with history of hemorrhagic CVA, HLD, HTN, asthma who presented with confusion, visual disturbance and aphasia.  MRI was positive for left occipital intraparenchymal hemorrhage with acute ischemic stroke to the left central ovale along with chronic hemorrhage over posterior left lobe.  Patient was admitted to the ICU under neurology service and transferred to [facility].  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  Repeat scans were unchanged.  -LDL 130, A1c 5.6 - Avoid antiplatelets and anticoagulation - Repeat CT head there is a status change - Echocardiogram-EF 55 to 60%, grade 1 DD -PT/OT-CIR.  Ongoing arrangements by [facility]  Essential hypertension - Systolic goal was less than 160. Avapro 150mg  po daily. IV Hydralazine abd labetalol  prn  Hyperlipidemia - LDL 130.  Intolerance to Lipitor therefore will be on Zetia on discharge  History of asthma -As needed bronchodilators    DVT prophylaxis: Subcu heparin  Code Status: Full code Family Communication:   Status is: Inpatient  Remains inpatient appropriate because:Unsafe d/c plan  Dispo: The patient is from: Home              Anticipated d/c is to: CIR              Patient currently is medically stable to d/c.  Awaiting bed at CIR   Difficult to place patient No       Nutritional status           Body mass index is 22.43 kg/m.           Subjective: No further change in neurologic status overall no acute events overnight.  Feels well.  Review of Systems Otherwise negative except as per HPI,  including: General: Denies fever, chills, night sweats or unintended weight loss. Resp: Denies cough, wheezing, shortness of breath. Cardiac: Denies chest pain, palpitations, orthopnea, paroxysmal nocturnal dyspnea. GI: Denies abdominal pain, nausea, vomiting, diarrhea or constipation GU: Denies dysuria, frequency, hesitancy or incontinence MS: Denies muscle aches, joint pain or swelling Neuro: Denies headache, neurologic deficits (focal weakness, numbness, tingling), abnormal gait Psych: Denies anxiety, depression, SI/HI/AVH Skin: Denies new rashes or lesions ID: Denies sick contacts, exotic exposures, travel  Examination: Constitutional: Not in acute distress Respiratory: Clear to auscultation bilaterally Cardiovascular: Normal sinus rhythm, no rubs Abdomen: Nontender nondistended good bowel sounds Musculoskeletal: No edema noted Skin: No rashes seen Neurologic: CN 2-12 grossly intact.  And nonfocal.  Very mild aphasia Psychiatric: Normal judgment and insight. Alert and oriented x 3. Normal mood. Objective: Vitals:   01/15/21 2016 01/15/21 2350 01/16/21 0351 01/16/21 0753  BP: 123/74 131/81 134/84 (!) 146/75  Pulse: 83 64 60   Resp: 19 17 19 18   Temp: 97.7 F (36.5 C) 98.2 F (36.8 C) 97.9 F (36.6 C) 98 F (36.7 C)  TempSrc: Oral Oral Oral Oral  SpO2: 97% 98% 99% 98%  Weight:      Height:        Intake/Output Summary (Last 24 hours) at 01/16/2021 0948 Last data filed at 01/16/2021 0900 Gross per 24 hour  Intake 100 ml  Output 900 ml  Net -800 ml   [employer]  01/11/21 1533  Weight: 77.1 kg     Data Reviewed:   CBC: Recent Labs  Lab 01/11/21 1552 01/11/21 1601 01/12/21 0100 01/15/21 0029 01/16/21 0033  WBC 5.9  --  6.0 5.2 5.7  NEUTROABS 4.8  --   --   --   --   HGB 14.6 15.3 13.5 14.2 14.6  HCT 44.1 45.0 40.0 42.0 43.3  MCV 100.2*  --  98.8 98.6 98.0  PLT 193  --  182 190 517   Basic Metabolic Panel: Recent Labs  Lab 01/11/21 1552  01/11/21 1601 01/12/21 0100 01/15/21 0029 01/16/21 0033  NA 143 135 136 136 137  K 4.5 4.3 3.6 4.0 4.3  CL 107 102 104 103 105  CO2 28  --  26 27 25   GLUCOSE 146* 143* 109* 97 102*  BUN 17 14 11 23  24*  CREATININE 0.90 0.80 0.85 1.02 0.94  CALCIUM 10.0  --  9.0 9.2 9.7  MG  --   --   --  2.2 2.0   GFR: Estimated Creatinine Clearance: 71.8 mL/min (by C-G formula based on SCr of 0.94 mg/dL). Liver Function Tests: Recent Labs  Lab 01/11/21 1552 01/12/21 0100  AST 22 19  ALT 14 13  ALKPHOS 55 52  BILITOT 1.0 1.4*  PROT 7.4 6.4*  ALBUMIN 4.0 3.3*   No results for input(s): LIPASE, AMYLASE in the last 168 hours. No results for input(s): AMMONIA in the last 168 hours. Coagulation Profile: Recent Labs  Lab 01/11/21 1552  INR 1.0   Cardiac Enzymes: No results for input(s): CKTOTAL, CKMB, CKMBINDEX, TROPONINI in the last 168 hours. BNP (last 3 results) No results for input(s): PROBNP in the last 8760 hours. HbA1C: No results for input(s): HGBA1C in the last 72 hours.  CBG: No results for input(s): GLUCAP in the last 168 hours. Lipid Profile: No results for input(s): CHOL, HDL, LDLCALC, TRIG, CHOLHDL, LDLDIRECT in the last 72 hours.  Thyroid Function Tests: No results for input(s): TSH, T4TOTAL, FREET4, T3FREE, THYROIDAB in the last 72 hours. Anemia Panel: No results for input(s): VITAMINB12, FOLATE, FERRITIN, TIBC, IRON, RETICCTPCT in the last 72 hours.  Sepsis Labs: No results for input(s): PROCALCITON, LATICACIDVEN in the last 168 hours.  Recent Results (from the past 240 hour(s))  SARS CORONAVIRUS 2 (TAT 6-24 HRS) Nasopharyngeal     Status: None   Collection Time: 01/11/21  4:55 PM   Specimen: Nasopharyngeal  Result Value Ref Range Status   SARS Coronavirus 2 NEGATIVE NEGATIVE Final    Comment: (NOTE) SARS-CoV-2 target nucleic acids are NOT DETECTED.  The SARS-CoV-2 RNA is generally detectable in upper and lower respiratory specimens during the acute phase  of infection. Negative results do not preclude SARS-CoV-2 infection, do not rule out co-infections with other pathogens, and should not be used as the sole basis for treatment or other patient management decisions. Negative results must be combined with clinical observations, patient history, and epidemiological information. The expected result is Negative.  Fact Sheet for Patients: SugarRoll.be  Fact Sheet for Healthcare Providers: https://www.woods-mathews.com/  This test is not yet approved or cleared by the [place] FDA and  has been authorized for detection and/or diagnosis of SARS-CoV-2 by FDA under an Emergency Use Authorization (EUA). This EUA will remain  in effect (meaning this test can be used) for the duration of the COVID-19 declaration under Se ction 564(b)(1) of the Act, 21 U.S.C. section 360bbb-3(b)(1), unless the authorization is terminated or revoked sooner.  Performed  at [facility], [place] [address], [place], [place] [address]   MRSA Next Gen by PCR, Nasal     Status: None   Collection Time: 01/11/21  8:42 PM   Specimen: Nasal Mucosa; Nasal Swab  Result Value Ref Range Status   MRSA by PCR Next Gen NOT DETECTED NOT DETECTED Final    Comment: (NOTE) The GeneXpert MRSA Assay (FDA approved for NASAL specimens only), is one component of a comprehensive MRSA colonization surveillance program. It is not intended to diagnose MRSA infection nor to guide or monitor treatment for MRSA infections. Test performance is not FDA approved in patients less than 76 years old. Performed at [facility], [place] [address], [place], [place] [address]          Radiology Studies: No results found.      Scheduled Meds:  ezetimibe  10 mg Oral Daily   irbesartan  150 mg Oral Daily   pantoprazole  40 mg Oral QHS   predniSONE  5 mg Oral Q breakfast   senna-docusate  1 tablet Oral BID   Continuous Infusions:    LOS: 5 days   Time spent= 35 mins    Najib Colmenares Arsenio Loader, MD [facility]  If 7PM-7AM, please contact night-coverage  01/16/2021, 9:48 AM

## 2021-01-17 ENCOUNTER — Encounter (HOSPITAL_COMMUNITY): Payer: Self-pay | Admitting: Physical Medicine and Rehabilitation

## 2021-01-17 ENCOUNTER — Other Ambulatory Visit: Payer: Self-pay

## 2021-01-17 ENCOUNTER — Inpatient Hospital Stay (HOSPITAL_COMMUNITY)
Admission: RE | Admit: 2021-01-17 | Discharge: 2021-01-24 | DRG: 057 | Disposition: A | Discharge status: Home / Self Care | Payer: Medicare Other | Source: Intra-hospital | Attending: Physical Medicine and Rehabilitation | Admitting: Physical Medicine and Rehabilitation

## 2021-01-17 DIAGNOSIS — I10 Essential (primary) hypertension: Secondary | ICD-10-CM | POA: Diagnosis present

## 2021-01-17 DIAGNOSIS — J45909 Unspecified asthma, uncomplicated: Secondary | ICD-10-CM | POA: Diagnosis present

## 2021-01-17 DIAGNOSIS — R2689 Other abnormalities of gait and mobility: Secondary | ICD-10-CM | POA: Diagnosis present

## 2021-01-17 DIAGNOSIS — Z87891 Personal history of nicotine dependence: Secondary | ICD-10-CM | POA: Diagnosis not present

## 2021-01-17 DIAGNOSIS — Z8249 Family history of ischemic heart disease and other diseases of the circulatory system: Secondary | ICD-10-CM | POA: Diagnosis not present

## 2021-01-17 DIAGNOSIS — Z7952 Long term (current) use of systemic steroids: Secondary | ICD-10-CM

## 2021-01-17 DIAGNOSIS — I69220 Aphasia following other nontraumatic intracranial hemorrhage: Secondary | ICD-10-CM | POA: Diagnosis not present

## 2021-01-17 DIAGNOSIS — I69298 Other sequelae of other nontraumatic intracranial hemorrhage: Secondary | ICD-10-CM

## 2021-01-17 DIAGNOSIS — E785 Hyperlipidemia, unspecified: Secondary | ICD-10-CM | POA: Diagnosis present

## 2021-01-17 DIAGNOSIS — H538 Other visual disturbances: Secondary | ICD-10-CM | POA: Diagnosis present

## 2021-01-17 DIAGNOSIS — Z79899 Other long term (current) drug therapy: Secondary | ICD-10-CM

## 2021-01-17 DIAGNOSIS — I69222 Dysarthria following other nontraumatic intracranial hemorrhage: Secondary | ICD-10-CM | POA: Diagnosis present

## 2021-01-17 DIAGNOSIS — Z888 Allergy status to other drugs, medicaments and biological substances status: Secondary | ICD-10-CM | POA: Diagnosis not present

## 2021-01-17 DIAGNOSIS — Z7951 Long term (current) use of inhaled steroids: Secondary | ICD-10-CM

## 2021-01-17 DIAGNOSIS — I680 Cerebral amyloid angiopathy: Secondary | ICD-10-CM | POA: Diagnosis present

## 2021-01-17 DIAGNOSIS — H53461 Homonymous bilateral field defects, right side: Secondary | ICD-10-CM | POA: Diagnosis present

## 2021-01-17 DIAGNOSIS — I619 Nontraumatic intracerebral hemorrhage, unspecified: Secondary | ICD-10-CM | POA: Diagnosis not present

## 2021-01-17 LAB — CBC
HCT: 42 % (ref 39.0–52.0)
Hemoglobin: 14.4 g/dL (ref 13.0–17.0)
MCH: 33.4 pg (ref 26.0–34.0)
MCHC: 34.3 g/dL (ref 30.0–36.0)
MCV: 97.4 fL (ref 80.0–100.0)
Platelets: 206 10*3/uL (ref 150–400)
RBC: 4.31 MIL/uL (ref 4.22–5.81)
RDW: 11.9 % (ref 11.5–15.5)
WBC: 6 10*3/uL (ref 4.0–10.5)
nRBC: 0 % (ref 0.0–0.2)

## 2021-01-17 LAB — MAGNESIUM: Magnesium: 2 mg/dL (ref 1.7–2.4)

## 2021-01-17 LAB — BASIC METABOLIC PANEL
Anion gap: 7 (ref 5–15)
BUN: 24 mg/dL — ABNORMAL HIGH (ref 8–23)
CO2: 25 mmol/L (ref 22–32)
Calcium: 9.3 mg/dL (ref 8.9–10.3)
Chloride: 104 mmol/L (ref 98–111)
Creatinine, Ser: 0.92 mg/dL (ref 0.61–1.24)
GFR, Estimated: >60 mL/min (ref >60)
Glucose, Bld: 104 mg/dL — ABNORMAL HIGH (ref 70–99)
Potassium: 4 mmol/L (ref 3.5–5.1)
Sodium: 136 mmol/L (ref 135–145)

## 2021-01-17 MED ORDER — PANTOPRAZOLE SODIUM 40 MG PO TBEC: 40.0000 mg | DELAYED_RELEASE_TABLET | Freq: Every day | ORAL | Status: DC

## 2021-01-17 MED ORDER — PREDNISONE 5 MG PO TABS
5.0000 mg | ORAL_TABLET | Freq: Every day | ORAL | Status: DC
  Administered 2021-01-18 – 2021-01-24 (×7): 5 mg via ORAL
  Filled 2021-01-17 (×7): qty 1

## 2021-01-17 MED ORDER — ACETAMINOPHEN 650 MG RE SUPP: 650.0000 mg | RECTAL | Status: DC | PRN

## 2021-01-17 MED ORDER — ACETAMINOPHEN 160 MG/5ML PO SOLN: 650.0000 mg | ORAL | Status: DC | PRN

## 2021-01-17 MED ORDER — IRBESARTAN 150 MG PO TABS: 150.0000 mg | ORAL_TABLET | Freq: Every day | ORAL | Status: DC

## 2021-01-17 MED ORDER — EZETIMIBE 10 MG PO TABS
10.0000 mg | ORAL_TABLET | Freq: Every day | ORAL | Status: DC
  Administered 2021-01-18: 10 mg via ORAL
  Filled 2021-01-17: qty 1

## 2021-01-17 MED ORDER — SENNOSIDES-DOCUSATE SODIUM 8.6-50 MG PO TABS
1.0000 | ORAL_TABLET | Freq: Two times a day (BID) | ORAL | Status: DC
  Administered 2021-01-17 – 2021-01-24 (×10): 1 via ORAL
  Filled 2021-01-17 (×14): qty 1

## 2021-01-17 MED ORDER — TRAMADOL HCL 50 MG PO TABS: 50.0000 mg | ORAL_TABLET | Freq: Two times a day (BID) | ORAL | Status: DC | PRN

## 2021-01-17 MED ORDER — OXYCODONE HCL 5 MG PO TABS: 5.0000 mg | ORAL_TABLET | ORAL | Status: DC | PRN

## 2021-01-17 MED ORDER — IPRATROPIUM-ALBUTEROL 0.5-2.5 (3) MG/3ML IN SOLN: 3.0000 mL | RESPIRATORY_TRACT | Status: DC | PRN

## 2021-01-17 MED ORDER — BLOOD PRESSURE CONTROL BOOK
Freq: Once | Status: AC
  Filled 2021-01-17: qty 1

## 2021-01-17 MED ORDER — EZETIMIBE 10 MG PO TABS: 10.0000 mg | ORAL_TABLET | Freq: Every day | ORAL | Status: DC

## 2021-01-17 MED ORDER — TRAZODONE HCL 50 MG PO TABS: 50.0000 mg | ORAL_TABLET | Freq: Every evening | ORAL | Status: DC | PRN

## 2021-01-17 MED ORDER — IRBESARTAN 75 MG PO TABS
150.0000 mg | ORAL_TABLET | Freq: Every day | ORAL | Status: DC
  Administered 2021-01-18 – 2021-01-24 (×7): 150 mg via ORAL
  Filled 2021-01-17 (×8): qty 2

## 2021-01-17 MED ORDER — PANTOPRAZOLE SODIUM 40 MG PO TBEC
40.0000 mg | DELAYED_RELEASE_TABLET | Freq: Every day | ORAL | Status: DC
  Administered 2021-01-17 – 2021-01-18 (×2): 40 mg via ORAL
  Filled 2021-01-17 (×2): qty 1

## 2021-01-17 MED ORDER — OXYCODONE HCL 5 MG PO TABS
5.0000 mg | ORAL_TABLET | Freq: Four times a day (QID) | ORAL | Status: DC | PRN
Start: 2021-01-17 — End: 2021-01-18

## 2021-01-17 MED ORDER — ACETAMINOPHEN 325 MG PO TABS: 650.0000 mg | ORAL_TABLET | ORAL | Status: DC | PRN

## 2021-01-17 MED ORDER — SENNOSIDES-DOCUSATE SODIUM 8.6-50 MG PO TABS: 1.0000 | ORAL_TABLET | Freq: Two times a day (BID) | ORAL | Status: DC

## 2021-01-17 NOTE — Progress Notes (Signed)
PMR Admission Coordinator Pre-Admission Assessment  Patient: Lee Blevins is an 75 y.o., male MRN: [id number] DOB: 09/22/1945 Height: 6' 1" (185.4 cm) Weight: 77.1 kg  Insurance Information HMO:     PPO:      PCP:      IPA:      80/20:      OTHER:  PRIMARY: medicare a/b      Policy#: [id number]      Subscriber: pt CM Name:       Phone#:      Fax#:  Pre-Cert#: verified online      Employer:  Benefits:  Phone #:      Name:  Eff. Date: 11/15/08 A and B     Deduct: $1556      Out of Pocket Max: n/a      Life Max: n/a CIR: 100%      SNF: 20 full days Outpatient: 80%     Co-Pay: 20% Home Health: 100%      Co-Pay:  DME: 80%     Co-Pay: 20% Providers:  SECONDARY: [employer]      Policy#: [id number]     Phone#: [phone]  Financial Counselor:       Phone#:   The "Data Collection Information Summary" for patients in Inpatient Rehabilitation Facilities with attached "Privacy Act Statement-Health Care Records" was provided and verbally reviewed with: Patient and Family  Emergency Contact Information Contact Information     Name Relation Home Work Mobile   Lee Blevins Spouse [phone]  [phone]       Current Medical History  Patient Admitting Diagnosis: CVA   History of Present Illness: Lee Blevins is a 75-year-old right-handed male with history of hypertension, 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  Presented 01/11/2021 with blurred vision/right side visual deficit and altered mental status.  On arrival to the ED systolic blood pressure in the 150s.  CT/MRI showed area of hemorrhage noted to the left occipital lobe measuring 3 x 7 x 2 0.8 x 2.6 cm, volume of 13.5 mL.  No mass-effect or midline shift.  Area of encephalomalacia within the posterior left parietal lobe and area of prior hemorrhage.  CT angiogram head and neck  no significant carotid or vertebral artery stenosis.  Follow-up imaging remained unchanged in size of left occipital intraparenchymal hematoma.  Echocardiogram with ejection fraction of 55 to 60% grade 1 diastolic dysfunction.  Admission chemistries unremarkable except glucose 146, alcohol negative, urine drug screen negative.  He was cleared to begin subcutaneous heparin for DVT prophylaxis 01/16/2021.  Tolerating a regular diet.  Therapy evaluations completed due to patient decreased functional mobility was recommended for a comprehensive rehab program.  Complete NIHSS TOTAL: 3  Patient's medical record from [facility] has been reviewed by the rehabilitation admission coordinator and physician.  Past Medical History  Past Medical History:  Diagnosis Date   Asthma    Chronic sinusitis    History of CVA (cerebrovascular accident) 2011   hemorrhagic   HTN (hypertension)    Hyperlipidemia    Nasal polyps    Pneumonia     Has the patient had major surgery during 100 days prior to admission? No  Family History   family history includes Heart disease in his father; Neurologic Disorder (age of onset: 50) in his mother; Valvular heart disease in his father.  Current Medications  Current Facility-Administered Medications:      family history includes Heart disease in his father; Neurologic Disorder (age of onset: 44) in his mother; Valvular heart disease in his father.   Current Medications   Current Facility-Administered Medications:    acetaminophen (TYLENOL) tablet 650 mg, 650 mg, Oral, Q4H PRN **OR** acetaminophen (TYLENOL) 160 MG/5ML solution 650 mg, 650 mg, Per Tube, Q4H PRN **OR** acetaminophen (TYLENOL) suppository 650 mg, 650 mg, Rectal, Q4H PRN, Bailey-Modzik, Delila A, NP   hydrALAZINE (APRESOLINE) injection 20 mg, 20 mg, Intravenous, Q4H PRN, Bailey-Modzik, Delila A, NP   ipratropium-albuterol (DUONEB) 0.5-2.5 (3) MG/3ML nebulizer solution 3 mL, 3 mL, Nebulization, Q4H PRN, Amin, Ankit Chirag, MD   irbesartan (AVAPRO) tablet 150 mg, 150 mg, Oral, Daily, Bailey-Modzik, Delila A, NP, 150 mg at 01/14/21 0911   labetalol (NORMODYNE) injection 10 mg, 10 mg,  Intravenous, Q4H PRN, Bailey-Modzik, Delila A, NP   oxyCODONE (Oxy IR/ROXICODONE) immediate release tablet 5 mg, 5 mg, Oral, Q4H PRN, Amin, Ankit Chirag, MD   pantoprazole (PROTONIX) injection 40 mg, 40 mg, Intravenous, QHS, Bailey-Modzik, Delila A, NP, 40 mg at 01/13/21 2121   predniSONE (DELTASONE) tablet 5 mg, 5 mg, Oral, Q breakfast, Bailey-Modzik, Delila A, NP   senna-docusate (Senokot-S) tablet 1 tablet, 1 tablet, Oral, BID, Bailey-Modzik, Delila A, NP, 1 tablet at 01/14/21 0911   traMADol (ULTRAM) tablet 50 mg, 50 mg, Oral, Q12H PRN, Bailey-Modzik, Delila A, NP   traZODone (DESYREL) tablet 50 mg, 50 mg, Oral, QHS PRN, Amin, Jeanella Flattery, MD   Patients Current Diet:  Diet Order                  Diet Heart Room service appropriate? Yes; Fluid consistency: Thin  Diet effective now                         Precautions / Restrictions Precautions Precautions: Fall Precaution Comments: BP <140, R visual field deficit, R inattention Restrictions Weight Bearing Restrictions: No    Has the patient had 2 or more falls or a fall with injury in the past year? No   Prior Activity Level Community (5-7x/wk): drives, manages own ADLs/IADLs, no DME at baseline   Prior Functional Level Self Care: Did the patient need help bathing, dressing, using the toilet or eating? Independent   Indoor Mobility: Did the patient need assistance with walking from room to room (with or without device)? Independent   Stairs: Did the patient need assistance with internal or external stairs (with or without device)? Independent   Functional Cognition: Did the patient need help planning regular tasks such as shopping or remembering to take medications? Independent   Patient Information Are you of Hispanic, Latino/a,or Spanish origin?: A. No, not of Hispanic, Latino/a, or Spanish origin What is your race?: A. White Do you need or want an interpreter to communicate with a doctor or health care staff?: 0.  No   Patient's Response To:  Health Literacy and Transportation Is the patient able to respond to health literacy and transportation needs?: Yes Health Literacy - How often do you need to have someone help you when you read instructions, pamphlets, or other written material from your doctor or pharmacy?: Never In the past 12 months, has lack of transportation kept you from medical appointments or from getting medications?: No In the past 12 months, has lack of transportation kept you from meetings, work, or from getting things needed for daily living?: No   Development worker, international aid / [place] Devices/Equipment:  Following Commands: Follows one step commands with increased time Safety/Judgement: Decreased awareness of safety, Decreased awareness of deficits General Comments: continues to demonstrate poor awareness into deficits. Disoriented to situation and time. Patient with STM deficits with inability to recall 1 step instructions and multi-step instructions during mobility. Requires verbal and tactile cues for wayfinding back to room. Attention: Sustained Sustained Attention: Appears intact Memory: Impaired Memory Impairment: Storage deficit Awareness: Impaired Awareness Impairment: Intellectual impairment, Anticipatory impairment Executive Function: Organizing Organizing: Impaired Organizing Impairment: Verbal complex Safety/Judgment: Impaired    Extremity Assessment (includes Sensation/Coordination)  Upper Extremity Assessment: Defer to OT evaluation  Lower Extremity Assessment: RLE deficits/detail RLE Deficits /  Details: 5/5 strength, impaired coordination. Per patient, sensation intact RLE Coordination: decreased fine motor, decreased gross motor    ADLs  Overall ADL's : Needs assistance/impaired Eating/Feeding: Set up, Supervision/ safety Eating/Feeding Details (indicate cue type and reason): VC to find all items on tray; difficulty safely placing items on tray; apparent poor depth perception Grooming: Minimal assistance Grooming Details (indicate cue type and reason): mod vc to locate sopa/papertowels, the sink Upper Body Bathing: Set up, Supervision/ safety, Sitting Lower Body Bathing: Min guard, Sit to/from stand Upper Body Dressing : Set up, Supervision/safety Lower Body Dressing: Min guard, Sit to/from stand Lower Body Dressing Details (indicate cue type and reason): difficulty with coordination noted Toilet Transfer: Minimal assistance Toilet Transfer Details (indicate cue type and reason): Difficulty with orientation in bathrooml unable to find handle to flush toilet or sink Toileting- Clothing Manipulation and Hygiene: Min guard Functional mobility during ADLs: Minimal assistance General ADL Comments: mod A with problem solving to find handle on toilet and to locate sink, soap and paper towels in bathroom; ran into bathroom door; cues to find recliner located on his R; note impaired depth perception during funcitonal tasks with under/over reaching    Mobility  Overal bed mobility: Needs Assistance Bed Mobility: Supine to Sit Supine to sit: Supervision    Transfers  Overall transfer level: Needs assistance Equipment used: None Transfers: Sit to/from Stand Sit to Stand: Min guard General transfer comment: min guard for safety    Ambulation / Gait / Stairs / Wheelchair Mobility  Ambulation/Gait Ambulation/Gait assistance: Min assist Gait Distance (Feet): 200 Feet Assistive device: None Gait Pattern/deviations: Step-through pattern, Decreased stride length, Staggering left,  Staggering right, Drifts right/left General Gait Details: minA for balance and avoiding obstacles on the R. Patient requires cues for wayfinding. Unable to locate objects during ambulation on R requiring patient to stop and face object. Patient unable to recall directions 30 seconds after provided for patient. Patient gets anxious during mobility when unable to remember directions. LOB x 2-3 during mobility requiring minA to recover. Gait velocity: decreased    Posture / Balance Balance Overall balance assessment: Needs assistance Sitting-balance support: No upper extremity supported, Feet supported Sitting balance-Leahy Scale: Good Standing balance support: No upper extremity supported, During functional activity Standing balance-Leahy Scale: Fair Standing balance comment: requires assist for safety and balance    Special needs/care consideration Na/   Previous Home Environment (from acute therapy documentation) Living Arrangements: Spouse/significant other  Lives With: Spouse Available Help at Discharge: Family Type of Home: House Home Layout: One level Home Access: Stairs to enter Entrance Stairs-Number of Steps: 2 Bathroom Shower/Tub: Walk-in shower Bathroom Toilet: Standard Bathroom Accessibility: Yes Home Care Services: No  Discharge Living Setting Plans for Discharge Living Setting: Patient's home, Lives with (comment) (spouse) Type of Home at Discharge: House Discharge Home Layout: One   documentation) Living Arrangements: Spouse/significant other  Lives With: Spouse Available Help at Discharge: Family Type of Home: House Home Layout: One level Home Access: Stairs to enter Technical brewer of Steps: 2 Bathroom Shower/Tub: Multimedia programmer: Programmer, systems: Yes [place]: No   Discharge Living Setting Plans for Discharge Living Setting: Patient's home, Lives with (comment) (spouse) Type of Home at Discharge: House Discharge Home Layout: One level Discharge Home Access: Stairs to enter Entrance Stairs-Rails: None Entrance Stairs-Number of Steps: 3+2 Discharge Bathroom Shower/Tub: Walk-in shower Discharge Bathroom Toilet: Standard Discharge Bathroom Accessibility: Yes How Accessible: Accessible via walker Does the patient have any problems obtaining your medications?: No   Social/Family/Support Systems Patient Roles: Spouse Anticipated Caregiver:  [name] Anticipated Ambulance person Information: [phone] Ability/Limitations of Caregiver: supervision only Caregiver Availability: 24/7 Discharge Plan Discussed with Primary Caregiver: Yes Is Caregiver In Agreement with Plan?: Yes Does Caregiver/Family have Issues with Lodging/Transportation while Pt is in Rehab?: No   Goals Patient/Family Goal for Rehab: PT/OT/SLP supervision to mod I Expected length of stay: 6-9 days Pt/Family Agrees to Admission and willing to participate: Yes Program Orientation Provided & Reviewed with Pt/Caregiver Including Roles  & Responsibilities: Yes   Decrease burden of Care through IP rehab admission: no   Possible need for SNF placement upon discharge: no   Patient Condition: I have reviewed medical records from [facility], spoken with CM, and patient, spouse, and son. I met with patient at the bedside for inpatient rehabilitation assessment.  Patient will benefit from ongoing PT, OT, and SLP, can actively participate in 3 hours of therapy a day 5 days of the week, and can make measurable gains during the admission.  Patient will also benefit from the coordinated team approach during an Inpatient Acute Rehabilitation admission.  The patient will receive intensive therapy as well as Rehabilitation physician, nursing, social worker, and care management interventions.  Due to safety, skin/wound care, disease management, medication administration, pain management, and patient education the patient requires 24 hour a day rehabilitation nursing.  The patient is currently min assist with mobility and basic ADLs.  Discharge setting and therapy post discharge at home with home health is anticipated.  Patient has agreed to participate in the Acute Inpatient Rehabilitation Program and will admit today.   Preadmission Screen Completed By: Shann Medal, PT, DPT and Michel Santee, PT, DPT 01/14/2021 11:44  AM ______________________________________________________________________   Discussed status with Dr. Ranell Patrick on 01/17/21 at 10:57 AM and received approval for admission today.   Admission Coordinator:  Michel Santee, PT, DPT time 10:57 AM Sudie Grumbling 01/17/21     Assessment/Plan: Diagnosis: ICH Does the need for close, 24 hr/day Medical supervision in concert with the patient's rehab needs make it unreasonable for this patient to be served in a less intensive setting? Yes Co-Morbidities requiring supervision/potential complications: HLD, nasal polyp, hepatic cirrhosis, CAD, osteoporosis Due to bladder management, bowel management, safety, skin/wound care, disease management, medication administration, pain management, and patient education, does the patient require 24 hr/day rehab nursing? Yes Does the patient require coordinated care of a physician, rehab nurse, PT, OT, and SLP to address physical and functional deficits in the context of the above medical diagnosis(es)? Yes Addressing deficits in the following areas: balance, endurance, locomotion, strength, transferring, bowel/bladder control, bathing, dressing, feeding, grooming, toileting, and psychosocial support Can the patient actively participate in an intensive therapy program of at least 3 hrs of therapy 5 days a week? Yes The potential for patient to make  measurable gains while on inpatient rehab is excellent Anticipated functional outcomes upon discharge from inpatient rehab: modified independent PT, modified independent OT, modified independent SLP Estimated rehab length of stay to reach the above functional goals is: 5 days Anticipated discharge destination: Home 10. Overall Rehab/Functional Prognosis: excellent     MD Signature: Leeroy Cha, MD

## 2021-01-17 NOTE — Progress Notes (Signed)
Occupational Therapy Treatment Patient Details Name: Lee Blevins MRN: [id number] DOB: 08-09-1945 Today's Date: 01/17/2021   History of present illness 75 yo admitted wtih confusion, aphasia and impaired vision. 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 PMH: hemorrhagic CVA, HTN, HLD, asthma.   OT comments  Patient sitting on eob when therapist entered room. Therapist arranged all self care items on right of sink and patient performed grooming and bathing standing at sink with improved scanning and right side awareness with patient able to locate 4/5 items.  Performed functional mobility in room wit awaking to bathroom with obstacles placed on right with patient being able to avoid without vcs.  Patient making good progress with OT treatment.     Recommendations for follow up therapy are one component of a multi-disciplinary discharge planning process, led by the attending physician.  Recommendations may be updated based on patient status, additional functional criteria and insurance authorization.    Follow Up Recommendations  CIR    Equipment Recommendations  None recommended by OT    Recommendations for Other Services Rehab consult    Precautions / Restrictions Precautions Precautions: Fall Precaution Comments: right visual field deficit       Mobility Bed Mobility               General bed mobility comments: sitting on eob when theapist arrived    Transfers Overall transfer level: Needs assistance Equipment used: None Transfers: Sit to/from Stand Sit to Stand: Min guard         General transfer comment: verbal cues to reach back to sit and to feel surface on back of legs for safety    Balance Overall balance assessment: Needs assistance Sitting-balance support: No upper extremity supported;Feet supported Sitting balance-Leahy Scale: Good     Standing  balance support: No upper extremity supported;During functional activity Standing balance-Leahy Scale: Fair Standing balance comment: supervision to min guard for safety                           ADL either performed or assessed with clinical judgement   ADL Overall ADL's : Needs assistance/impaired     Grooming: Min guard;Cueing for sequencing;Cueing for compensatory techniques;Standing Grooming Details (indicate cue type and reason): occassional vc to locate items on right Upper Body Bathing: Supervision/ safety;Set up;Standing   Lower Body Bathing: Min guard;Sit to/from stand Lower Body Bathing Details (indicate cue type and reason): vcs for sequencing Upper Body Dressing : Set up;Supervision/safety Upper Body Dressing Details (indicate cue type and reason): donned gown     Toilet Transfer: Min guard;Cueing for safety Toilet Transfer Details (indicate cue type and reason): able to navigate to bathroom and avoid items to right         Functional mobility during ADLs: Min guard General ADL Comments: increased attention to right during self care and transfers     Vision       Perception     Praxis      Cognition Arousal/Alertness: Awake/alert Behavior During Therapy: Anxious Overall Cognitive Status: Impaired/Different from baseline Area of Impairment: Following commands;Awareness;Problem solving;Memory                     Memory: Decreased short-term memory Following Commands: Follows one step commands with increased time;Follows one step commands consistently Safety/Judgement: Decreased awareness of safety;Decreased awareness of deficits Awareness: Emergent;Anticipatory Problem Solving:  Slow processing;Requires verbal cues General Comments: able to locate items on the sink positioned to the right, able to navigate in his room and avoided items to the right        Exercises     Shoulder Instructions       General Comments       Pertinent Vitals/ Pain       Pain Assessment: No/denies pain  Home Living                                          Prior Functioning/Environment              Frequency  Min 2X/week        Progress Toward Goals  OT Goals(current goals can now be found in the care plan section)  Progress towards OT goals: Progressing toward goals  Acute Rehab OT Goals Patient Stated Goal: to go home OT Goal Formulation: With patient Time For Goal Achievement: 01/26/21 Potential to Achieve Goals: Good ADL Goals Pt Will Perform Lower Body Bathing: with modified independence;sit to/from stand Pt Will Perform Lower Body Dressing: with modified independence;sit to/from stand Pt Will Transfer to Toilet: with modified independence;ambulating Additional ADL Goal #1: Pt will find 5/10 targets in central visual field with min vc Additional ADL Goal #2: Pt will demonstrate anchoring techniques with min VC when scanning environment to reduce risk of falls during functional mobility  Plan Discharge plan remains appropriate    Co-evaluation                 AM-PAC OT "6 Clicks" Daily Activity     Outcome Measure   Help from another person eating meals?: A Little Help from another person taking care of personal grooming?: A Little Help from another person toileting, which includes using toliet, bedpan, or urinal?: A Lot Help from another person bathing (including washing, rinsing, drying)?: A Little Help from another person to put on and taking off regular upper body clothing?: A Little Help from another person to put on and taking off regular lower body clothing?: A Little 6 Click Score: 17    End of Session    OT Visit Diagnosis: Other abnormalities of gait and mobility (R26.89);Low vision, both eyes (H54.2);Other symptoms and signs involving cognitive function   Activity Tolerance Patient tolerated treatment well   Patient Left in chair;with call bell/phone  within reach   Nurse Communication Mobility status        Time: 0175-1025 OT Time Calculation (min): 30 min  Charges: OT General Charges $OT Visit: 1 Visit OT Treatments $Self Care/Home Management : 23-37 mins  Lodema Hong, [place] 01/17/2021, 9:37 AM

## 2021-01-17 NOTE — Care Management Important Message (Signed)
Important Message  Patient Details  Name: Lee Blevins MRN: [id number] Date of Birth: 05-26-1945   Medicare Important Message Given:  Yes     Orbie Pyo 01/17/2021, 4:06 PM

## 2021-01-17 NOTE — H&P (Signed)
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  Per chart review patient lives with spouse.  1 level home 2 steps to enter.  Independent driving prior to admission manages his own medications and finances.  Wife is limited physically.  Presented 01/11/2021 with blurred vision/right side visual deficit and altered mental status.  On arrival to the ED systolic blood pressure in the 150s.  CT/MRI showed area of hemorrhage noted to the left occipital lobe measuring 3 x 7 x 2 0.8 x 2.6 cm, volume of 13.5 mL.  No mass-effect or midline shift.  Area of encephalomalacia within the posterior left parietal lobe and area of prior hemorrhage.  CT angiogram head and neck no significant carotid or vertebral artery stenosis.  Follow-up imaging remained unchanged in size of left occipital intraparenchymal hematoma.  Echocardiogram with ejection fraction of 55 to 59% grade 1 diastolic dysfunction.  Admission chemistries unremarkable except glucose 146, alcohol negative, urine drug screen negative.  Tolerating a regular diet.  Therapy evaluations completed due to patient decreased functional mobility was admitted for a comprehensive rehab program. Denies pain, constipation.  Review of Systems  Constitutional:  Negative for chills and fever.  HENT:  Negative for hearing loss.   Eyes:  Positive for blurred vision.  Respiratory:  Negative for cough and shortness of breath.   Cardiovascular:  Negative for chest pain, palpitations and leg swelling.  Gastrointestinal:  Negative for constipation, heartburn, nausea and vomiting.  Genitourinary:   Negative for dysuria, flank pain and hematuria.  Musculoskeletal:  Negative for joint pain and myalgias.  Skin:  Negative for rash.  Neurological:  Positive for speech change.       Occasional headaches.  All other systems reviewed and are negative. Past Medical History:  Diagnosis Date   Asthma    Chronic sinusitis    History of CVA (cerebrovascular accident) 2011   hemorrhagic   HTN (hypertension)    Hyperlipidemia    Nasal polyps    Pneumonia    Past Surgical History:  Procedure Laterality Date   NASAL POLYP EXCISION     x2   TONSILLECTOMY     Family History  Problem Relation Age of Onset   Heart disease Father    Valvular heart disease Father    Neurologic Disorder Mother 41       Similar to MS   Cancer Neg Hx    Early death Neg Hx    Hearing loss Neg Hx    Hyperlipidemia Neg Hx    Hypertension Neg Hx    Stroke Neg Hx    Kidney disease Neg Hx    Social History:  reports that he quit smoking about 47 years ago. His smoking use included cigarettes. He has a 1.00 pack-year smoking history. He has never used smokeless tobacco. He reports current alcohol use of about 15.0 standard drinks per week. He reports that he does not use drugs. Allergies:  Allergies  Allergen Reactions   Amlodipine     edema   Crestor [Rosuvastatin]     Muscle aches   Livalo [Pitavastatin]     Muscle aches   Bystolic USAA  Hcl]     dizziness   Medications Prior to Admission  Medication Sig Dispense Refill   albuterol (VENTOLIN HFA) 108 (90 Base) MCG/ACT inhaler Inhale 2 puffs into the lungs every 6 (six) hours as needed for wheezing or shortness of breath. 8 g 11   fluticasone-salmeterol (ADVAIR DISKUS) 250-50 MCG/ACT AEPB INHALE 1 INHALATION TWICE DAILY *RINSE MOUTH AFTERWARDS* 180 each 0   montelukast (SINGULAIR) 10 MG tablet TAKE 1 TABLET BY MOUTH EVERYDAY AT BEDTIME (Patient taking differently: Take 10 mg by mouth at bedtime.) 90 tablet 2   olmesartan (BENICAR) 20 MG tablet TAKE  1 TABLET BY MOUTH EVERY DAY (Patient taking differently: Take 10 mg by mouth daily as needed (blood pressure).) 90 tablet 1   predniSONE (DELTASONE) 5 MG tablet Take 5 mg by mouth daily.      Drug Regimen Review Drug regimen was reviewed and remains appropriate with no significant issues identified  Home: Home Living Family/patient expects to be discharged to:: Private residence Living Arrangements: Spouse/significant other Available Help at Discharge: Family Type of Home: House Home Access: Stairs to enter Technical brewer of Steps: 2 Home Layout: One level Bathroom Shower/Tub: Multimedia programmer: Associate Professor Accessibility: Yes Home Equipment: Grab bars - tub/shower, Civil engineer, contracting, Radio producer - single point  Lives With: Spouse   Functional History: Prior Function Level of Independence: Independent Comments: drives, manages own medications and finances  Functional Status:  Mobility: Bed Mobility Overal bed mobility: Needs Assistance Bed Mobility: Supine to Sit Supine to sit: Supervision Transfers Overall transfer level: Needs assistance Equipment used: None Transfers: Sit to/from Stand Sit to Stand: Min guard General transfer comment: min guard for safety; slightly back on his heels Ambulation/Gait Ambulation/Gait assistance: Min assist Gait Distance (Feet): 200 Feet Assistive device: None Gait Pattern/deviations: Step-through pattern, Decreased stride length, Staggering left, Staggering right, Drifts right/left General Gait Details: minA for balance during scavenger hunt in hallway. Improved visual scanning during ambulation to find objects on right. ABle to call out obstacles in hallway on his right and then avoid them except once in his room ran into computer stand with imbalance occuring. Gait velocity: decreased    ADL: ADL Overall ADL's : Needs assistance/impaired Eating/Feeding: Set up, Supervision/ safety Eating/Feeding Details (indicate cue  type and reason): VC to find all items on tray; difficulty safely placing items on tray; apparent poor depth perception Grooming: Minimal assistance Grooming Details (indicate cue type and reason): mod vc to locate sopa/papertowels, the sink Upper Body Bathing: Set up, Supervision/ safety, Sitting Lower Body Bathing: Min guard, Sit to/from stand Upper Body Dressing : Set up, Supervision/safety Lower Body Dressing: Min guard, Sit to/from stand Lower Body Dressing Details (indicate cue type and reason): difficulty with coordination noted Toilet Transfer: Minimal assistance Toilet Transfer Details (indicate cue type and reason): Difficulty with orientation in bathrooml unable to find handle to flush toilet or sink Toileting- Clothing Manipulation and Hygiene: Min guard Functional mobility during ADLs: Minimal assistance General ADL Comments: mod A with problem solving to find handle on toilet and to locate sink, soap and paper towels in bathroom; ran into bathroom door; cues to find recliner located on his R; note impaired depth perception during funcitonal tasks with under/over reaching  Cognition: Cognition Overall Cognitive Status: Impaired/Different from baseline Arousal/Alertness: Awake/alert Orientation Level: Oriented X4 Attention: Sustained Sustained Attention: Appears intact Memory: Impaired Memory Impairment: Storage deficit Awareness: Impaired Awareness Impairment: Intellectual impairment, Anticipatory impairment Executive Function: Organizing Organizing: Impaired Organizing Impairment: Verbal complex Safety/Judgment: Impaired Cognition  Arousal/Alertness: Awake/alert Behavior During Therapy: Anxious Overall Cognitive Status: Impaired/Different from baseline Area of Impairment: Following commands, Awareness, Problem solving, Memory Orientation Level: Disoriented to, Time Current Attention Level: Sustained Memory: Decreased short-term memory (forgot to compensate with head  turns to rt) Following Commands: Follows one step commands with increased time, Follows one step commands consistently Safety/Judgement: Decreased awareness of safety, Decreased awareness of deficits Awareness: Emergent, Anticipatory Problem Solving: Slow processing, Requires verbal cues, Requires tactile cues General Comments: Able to describe his deficits and that he is not safe to get up and walk alone. Did not recall rt head turns for compensation, but once instructed, he consistently used.  Physical Exam: Blood pressure 107/69, pulse 75, temperature 97.6 F (36.4 C), temperature source Oral, resp. rate 19, height 6\' 1"  (1.854 m), weight 77.1 kg, SpO2 94 %. Gen: no distress, normal appearing HEENT: oral mucosa pink and moist, NCAT Cardio: Reg rate Chest: normal effort, normal rate of breathing Abd: soft, non-distended Ext: no edema Psych: pleasant, normal affect Skin: intact Neurological:     Comments: Patient is alert.  Right homonymous hemianopsia.  Makes eye contact with examiner he does have some word finding difficulties and paraphasic errors.  He has some difficulty naming repetition.  Follows simple demonstrated commands. 5/5 strength throughout, sensation intact throughout. No dysmetria  Results for orders placed or performed during the hospital encounter of 01/11/21 (from the past 48 hour(s))  Basic metabolic panel     Status: Abnormal   Collection Time: 01/16/21 12:33 AM  Result Value Ref Range   Sodium 137 135 - 145 mmol/L   Potassium 4.3 3.5 - 5.1 mmol/L   Chloride 105 98 - 111 mmol/L   CO2 25 22 - 32 mmol/L   Glucose, Bld 102 (H) 70 - 99 mg/dL    Comment: Glucose reference range applies only to samples taken after fasting for at least 8 hours.   BUN 24 (H) 8 - 23 mg/dL   Creatinine, Ser 0.94 0.61 - 1.24 mg/dL   Calcium 9.7 8.9 - 10.3 mg/dL   GFR, Estimated >60 >60 mL/min    Comment: (NOTE) Calculated using the CKD-EPI Creatinine Equation (2021)    Anion gap 7 5  - 15    Comment: Performed at [facility] [address], [place], [place] [address]  CBC     Status: None   Collection Time: 01/16/21 12:33 AM  Result Value Ref Range   WBC 5.7 4.0 - 10.5 K/uL   RBC 4.42 4.22 - 5.81 MIL/uL   Hemoglobin 14.6 13.0 - 17.0 g/dL   HCT 43.3 39.0 - 52.0 %   MCV 98.0 80.0 - 100.0 fL   MCH 33.0 26.0 - 34.0 pg   MCHC 33.7 30.0 - 36.0 g/dL   RDW 12.0 11.5 - 15.5 %   Platelets 195 150 - 400 K/uL   nRBC 0.0 0.0 - 0.2 %    Comment: Performed at [facility], [place] [address], [place], [place] [address]  Magnesium     Status: None   Collection Time: 01/16/21 12:33 AM  Result Value Ref Range   Magnesium 2.0 1.7 - 2.4 mg/dL    Comment: Performed at [facility] [address] [place], [place] [address]  Basic metabolic panel     Status: Abnormal   Collection Time: 01/17/21  1:21 AM  Result Value Ref Range   Sodium 136 135 - 145 mmol/L   Potassium 4.0 3.5 - 5.1 mmol/L  Chloride 104 98 - 111 mmol/L   CO2 25 22 - 32 mmol/L   Glucose, Bld 104 (H) 70 - 99 mg/dL    Comment: Glucose reference range applies only to samples taken after fasting for at least 8 hours.   BUN 24 (H) 8 - 23 mg/dL   Creatinine, Ser 0.92 0.61 - 1.24 mg/dL   Calcium 9.3 8.9 - 10.3 mg/dL   GFR, Estimated >60 >60 mL/min    Comment: (NOTE) Calculated using the CKD-EPI Creatinine Equation (2021)    Anion gap 7 5 - 15    Comment: Performed at [facility] [address] [place], [place] [address]  CBC     Status: None   Collection Time: 01/17/21  1:21 AM  Result Value Ref Range   WBC 6.0 4.0 - 10.5 K/uL   RBC 4.31 4.22 - 5.81 MIL/uL   Hemoglobin 14.4 13.0 - 17.0 g/dL   HCT 42.0 39.0 - 52.0 %   MCV 97.4 80.0 - 100.0 fL   MCH 33.4 26.0 - 34.0 pg   MCHC 34.3 30.0 - 36.0 g/dL   RDW 11.9 11.5 - 15.5 %   Platelets 206 150 - 400 K/uL   nRBC 0.0 0.0 - 0.2 %    Comment: Performed at [facility], [place] [address], [place], [place] [address]  Magnesium      Status: None   Collection Time: 01/17/21  1:21 AM  Result Value Ref Range   Magnesium 2.0 1.7 - 2.4 mg/dL    Comment: Performed at [facility] [address] [place], [place] [address]   No results found.     Medical Problem List and Plan: 1.  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  Small area of acute ischemia in the left centrum semiovale likely from small vessel disease as well as history of left parietal parenchymal ICH 2011  -patient may shower  -ELOS/Goals: 5 days modI  -Admit to CIR 2.  Antithrombotics: -DVT/anticoagulation:  SCD  -antiplatelet therapy: N/A 3. Pain: Decrease Oxycodone to 5mg  q6H prn/tramadol as needed 4. Mood: Provide emotional support  -antipsychotic agents: N/A 5. Neuropsych: This patient is not capable of making decisions on his own behalf. 6. Skin/Wound Care: Routine skin checks 7. Fluids/Electrolytes/Nutrition: Routine in and outs with follow-up chemistries 8.  Hypertension.  Very well controlled, continue Avapro 150 mg daily.  Monitor with increased mobility 9.  Asthma.  Chronic prednisone 5 mg daily.  Continue nebulizers. 10.  Hyperlipidemia.  LDL 130, TG 42, HDL 112, VLDL 8, total cholesterol 250, Total cholesterol/HDL ratio is 2.2. Discuss risks and benefits of continuing Zetia 11.  History of alcohol use.  Alcohol screen negative.  Provide counseling   I have personally performed a face to face diagnostic evaluation, including, but not limited to relevant history and physical exam findings, of this patient and developed relevant assessment and plan.  Additionally, I have reviewed and concur with the physician assistant's documentation above.  Leeroy Cha, MD  Lavon Paganini [place], PA-C 01/17/2021

## 2021-01-17 NOTE — Progress Notes (Signed)
Inpatient Rehabilitation Medication Review by a Pharmacist  A complete drug regimen review was completed for this patient to identify any potential clinically significant medication issues.  High Risk Drug Classes Is patient taking? Indication by Medication  Antipsychotic No   Anticoagulant No   Antibiotic No   Opioid Yes Oxycodone prn for moderate to severe pain  Antiplatelet No   Hypoglycemics/insulin No   Vasoactive Medication Yes Irbesartan for HTN  Chemotherapy No   Other Yes Prednisone for asthma      Type of Medication Issue Identified Description of Issue Recommendation(s)  Drug Interaction(s) (clinically significant)     Duplicate Therapy     Allergy     No Medication Administration End Date     Incorrect Dose     Additional Drug Therapy Needed     Significant med changes from prior encounter (inform family/care partners about these prior to discharge). Advair and montelukast not resumed from PTA med list Consider resuming these medications  Other  Pantoprazole was started during inpatient admit - no indication in note Consider stopping this medication     Clinically significant medication issues were identified that warrant physician communication and completion of prescribed/recommended actions by midnight of the next day:  Yes  Name of provider notified for urgent issues identified: Marlowe Shores, PA  Provider Method of Notification: secure chat, call (no answer)   Pharmacist comments:  -Consider resuming Advair and montelukast -Consider discontinuing pantoprazole  Time spent performing this drug regimen review (minutes):  15   Thank you for involving pharmacy in this patient's care.  Renold Genta, PharmD, BCPS Clinical Pharmacist Clinical phone for 01/17/2021 until 9:30p is [phone] 01/17/2021 5:54 PM  **Pharmacist phone directory can be found on [email or web address] listed under [place]**

## 2021-01-17 NOTE — Plan of Care (Signed)
  Problem: Consults Goal: RH STROKE PATIENT EDUCATION Description: See Patient Education module for education specifics  Outcome: Adequate for Discharge   Problem: RH BOWEL ELIMINATION Goal: RH STG MANAGE BOWEL WITH ASSISTANCE Description: STG Manage Bowel with mod I Assistance. Outcome: Adequate for Discharge Goal: RH STG MANAGE BOWEL W/MEDICATION W/ASSISTANCE Description: STG Manage Bowel with Medication with mod I Assistance. Outcome: Adequate for Discharge   Problem: RH SAFETY Goal: RH STG ADHERE TO SAFETY PRECAUTIONS W/ASSISTANCE/DEVICE Description: STG Adhere to Safety Precautions With cues Assistance/Device. Outcome: Adequate for Discharge   Problem: RH KNOWLEDGE DEFICIT Goal: RH STG INCREASE KNOWLEDGE OF HYPERTENSION Description: Patient will be able to manage HTN with medications and dietary modifications using handouts and educational resources independently Outcome: Adequate for Discharge Goal: RH STG INCREASE KNOWLEGDE OF HYPERLIPIDEMIA Description: Patient will be able to manage HLD with medications and dietary modifications using handouts and educational resources independently Outcome: Adequate for Discharge Goal: RH STG INCREASE KNOWLEDGE OF STROKE PROPHYLAXIS Description: Patient will be able to manage secondary risks with medications and dietary modifications using handouts and educational resources independently Outcome: Adequate for Discharge

## 2021-01-17 NOTE — Progress Notes (Signed)
Inpatient Rehab Admissions Coordinator:   I have a bed for this patient to admit to CIR today. Dr. Reesa Chew in agreement and [facility] and family aware.   Shann Medal, PT, DPT Admissions Coordinator [phone] 01/17/21  10:49 AM

## 2021-01-17 NOTE — Progress Notes (Signed)
INPATIENT REHABILITATION ADMISSION NOTE   Arrival Method: wheelchair      Mental Orientation:alert and oriented x4   Assessment: see flowsheet    Skin: no issues   IV'S: none   Pain: no pain   Tubes and Drains: none   Safety Measures: fall risk, chair alarm on    Vital Signs: see flowsheet    Height and Weight: see flowsheet    Rehab Orientation: done by RN    Family: wife notified    Notes:

## 2021-01-17 NOTE — Plan of Care (Signed)
  Problem: Education: Goal: Knowledge of General Education information will improve Description: Including pain rating scale, medication(s)/side effects and non-pharmacologic comfort measures Outcome: Progressing   Problem: Health Behavior/Discharge Planning: Goal: Ability to manage health-related needs will improve Outcome: Progressing   Problem: Clinical Measurements: Goal: Ability to maintain clinical measurements within normal limits will improve Outcome: Progressing Goal: Will remain free from infection Outcome: Progressing Goal: Diagnostic test results will improve Outcome: Progressing Goal: Respiratory complications will improve Outcome: Progressing Goal: Cardiovascular complication will be avoided Outcome: Progressing   Problem: Activity: Goal: Risk for activity intolerance will decrease Outcome: Progressing   Problem: Nutrition: Goal: Adequate nutrition will be maintained Outcome: Progressing   Problem: Coping: Goal: Level of anxiety will decrease Outcome: Progressing   Problem: Elimination: Goal: Will not experience complications related to bowel motility Outcome: Progressing Goal: Will not experience complications related to urinary retention Outcome: Progressing   Problem: Pain Managment: Goal: General experience of comfort will improve Outcome: Progressing   Problem: Safety: Goal: Ability to remain free from injury will improve Outcome: Progressing   Problem: Skin Integrity: Goal: Risk for impaired skin integrity will decrease Outcome: Progressing   Problem: Education: Goal: Knowledge of disease or condition will improve Outcome: Progressing Goal: Knowledge of secondary prevention will improve Outcome: Progressing Goal: Knowledge of patient specific risk factors addressed and post discharge goals established will improve Outcome: Progressing Goal: Individualized Educational Video(s) Outcome: Progressing   Problem: Coping: Goal: Will verbalize  positive feelings about self Outcome: Progressing Goal: Will identify appropriate support needs Outcome: Progressing   Problem: Health Behavior/Discharge Planning: Goal: Ability to manage health-related needs will improve Outcome: Progressing   Problem: Self-Care: Goal: Ability to participate in self-care as condition permits will improve Outcome: Progressing Goal: Verbalization of feelings and concerns over difficulty with self-care will improve Outcome: Progressing Goal: Ability to communicate needs accurately will improve Outcome: Progressing   Problem: Nutrition: Goal: Risk of aspiration will decrease Outcome: Progressing Goal: Dietary intake will improve Outcome: Progressing   Problem: Intracerebral Hemorrhage Tissue Perfusion: Goal: Complications of Intracerebral Hemorrhage will be minimized Outcome: Progressing   

## 2021-01-17 NOTE — H&P (Signed)
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  Per chart review patient lives with spouse.  1 level home 2 steps to enter.  Independent driving prior to admission manages his own medications and finances.  Wife is limited physically.  Presented 01/11/2021 with blurred vision/right side visual deficit and altered mental status.  On arrival to the ED systolic blood pressure in the 150s.  CT/MRI showed area of hemorrhage noted to the left occipital lobe measuring 3 x 7 x 2 0.8 x 2.6 cm, volume of 13.5 mL.  No mass-effect or midline shift.  Area of encephalomalacia within the posterior left parietal lobe and area of prior hemorrhage.  CT angiogram head and neck no significant carotid or vertebral artery stenosis.  Follow-up imaging remained unchanged in size of left occipital intraparenchymal hematoma.  Echocardiogram with ejection fraction of 55 to 09% grade 1 diastolic dysfunction.  Admission chemistries unremarkable except glucose 146, alcohol negative, urine drug screen negative.  Tolerating a regular diet.  Therapy evaluations completed due to patient decreased functional mobility was admitted for a comprehensive rehab program. Denies pain, constipation.  Review of Systems  Constitutional:  Negative for chills and fever.  HENT:  Negative for hearing loss.   Eyes:  Positive for blurred vision.  Respiratory:  Negative for cough and shortness of breath.   Cardiovascular:  Negative for chest pain, palpitations and leg swelling.  Gastrointestinal:  Negative for constipation, heartburn, nausea and vomiting.  Genitourinary:  Negative for dysuria, flank pain and hematuria.  Musculoskeletal:  Negative for  joint pain and myalgias.  Skin:  Negative for rash.  Neurological:  Positive for speech change.       Occasional headaches.  All other systems reviewed and are negative. Past Medical History:  Diagnosis Date   Asthma    Chronic sinusitis    History of CVA (cerebrovascular accident) 2011   hemorrhagic   HTN (hypertension)    Hyperlipidemia    Nasal polyps    Pneumonia    Past Surgical History:  Procedure Laterality Date   NASAL POLYP EXCISION     x2   TONSILLECTOMY     Family History  Problem Relation Age of Onset   Heart disease Father    Valvular heart disease Father    Neurologic Disorder Mother 74       Similar to MS   Cancer Neg Hx    Early death Neg Hx    Hearing loss Neg Hx    Hyperlipidemia Neg Hx    Hypertension Neg Hx    Stroke Neg Hx    Kidney disease Neg Hx    Social History:  reports that he quit smoking about 47 years ago. His smoking use included cigarettes. He has a 1.00 pack-year smoking history. He has never used smokeless tobacco. He reports current alcohol use of about 15.0 standard drinks per week. He reports that he does not use drugs. Allergies:  Allergies  Allergen Reactions   Amlodipine     edema   Crestor [Rosuvastatin]     Muscle aches   Livalo [Pitavastatin]     Muscle aches   Bystolic [Nebivolol Hcl]     dizziness   Medications Prior to Admission  Medication Sig Dispense Refill  albuterol (VENTOLIN HFA) 108 (90 Base) MCG/ACT inhaler Inhale 2 puffs into the lungs every 6 (six) hours as needed for wheezing or shortness of breath. 8 g 11   [START ON 01/18/2021] ezetimibe (ZETIA) 10 MG tablet Take 1 tablet (10 mg total) by mouth daily.     fluticasone-salmeterol (ADVAIR DISKUS) 250-50 MCG/ACT AEPB INHALE 1 INHALATION TWICE DAILY *RINSE MOUTH AFTERWARDS* 180 each 0   [START ON 01/18/2021] irbesartan (AVAPRO) 150 MG tablet Take 1 tablet (150 mg total) by mouth daily.     montelukast (SINGULAIR) 10 MG tablet TAKE 1 TABLET BY MOUTH EVERYDAY AT  BEDTIME (Patient taking differently: Take 10 mg by mouth at bedtime.) 90 tablet 2   pantoprazole (PROTONIX) 40 MG tablet Take 1 tablet (40 mg total) by mouth daily before breakfast.     predniSONE (DELTASONE) 5 MG tablet Take 5 mg by mouth daily.     senna-docusate (SENOKOT-S) 8.6-50 MG tablet Take 1 tablet by mouth 2 (two) times daily.      Drug Regimen Review Drug regimen was reviewed and remains appropriate with no significant issues identified  Home: Home Living Family/patient expects to be discharged to:: Private residence Living Arrangements: Spouse/significant other Available Help at Discharge: Family Type of Home: House Home Access: Stairs to enter Technical brewer of Steps: 2 Home Layout: One level Bathroom Shower/Tub: Multimedia programmer: Associate Professor Accessibility: Yes Home Equipment: Grab bars - tub/shower, Civil engineer, contracting, Radio producer - single point  Lives With: Spouse   Functional History: Prior Function Level of Independence: Independent Comments: drives, manages own medications and finances   Functional Status:  Mobility: Bed Mobility Overal bed mobility: Needs Assistance Bed Mobility: Supine to Sit Supine to sit: Supervision Transfers Overall transfer level: Needs assistance Equipment used: None Transfers: Sit to/from Stand Sit to Stand: Min guard General transfer comment: min guard for safety; slightly back on his heels Ambulation/Gait Ambulation/Gait assistance: Min assist Gait Distance (Feet): 200 Feet Assistive device: None Gait Pattern/deviations: Step-through pattern, Decreased stride length, Staggering left, Staggering right, Drifts right/left General Gait Details: minA for balance during scavenger hunt in hallway. Improved visual scanning during ambulation to find objects on right. ABle to call out obstacles in hallway on his right and then avoid them except once in his room ran into computer stand with imbalance occuring. Gait  velocity: decreased   ADL: ADL Overall ADL's : Needs assistance/impaired Eating/Feeding: Set up, Supervision/ safety Eating/Feeding Details (indicate cue type and reason): VC to find all items on tray; difficulty safely placing items on tray; apparent poor depth perception Grooming: Minimal assistance Grooming Details (indicate cue type and reason): mod vc to locate sopa/papertowels, the sink Upper Body Bathing: Set up, Supervision/ safety, Sitting Lower Body Bathing: Min guard, Sit to/from stand Upper Body Dressing : Set up, Supervision/safety Lower Body Dressing: Min guard, Sit to/from stand Lower Body Dressing Details (indicate cue type and reason): difficulty with coordination noted Toilet Transfer: Minimal assistance Toilet Transfer Details (indicate cue type and reason): Difficulty with orientation in bathrooml unable to find handle to flush toilet or sink Toileting- Clothing Manipulation and Hygiene: Min guard Functional mobility during ADLs: Minimal assistance General ADL Comments: mod A with problem solving to find handle on toilet and to locate sink, soap and paper towels in bathroom; ran into bathroom door; cues to find recliner located on his R; note impaired depth perception during funcitonal tasks with under/over reaching   Cognition: Cognition Overall Cognitive Status: Impaired/Different from baseline Arousal/Alertness: Awake/alert Orientation Level: Oriented X4  Attention: Sustained Sustained Attention: Appears intact Memory: Impaired Memory Impairment: Storage deficit Awareness: Impaired Awareness Impairment: Intellectual impairment, Anticipatory impairment Executive Function: Writer: Impaired Organizing Impairment: Verbal complex Safety/Judgment: Impaired Cognition Arousal/Alertness: Awake/alert Behavior During Therapy: Anxious Overall Cognitive Status: Impaired/Different from baseline Area of Impairment: Following commands, Awareness, Problem  solving, Memory Orientation Level: Disoriented to, Time Current Attention Level: Sustained Memory: Decreased short-term memory (forgot to compensate with head turns to rt) Following Commands: Follows one step commands with increased time, Follows one step commands consistently Safety/Judgement: Decreased awareness of safety, Decreased awareness of deficits Awareness: Emergent, Anticipatory Problem Solving: Slow processing, Requires verbal cues, Requires tactile cues General Comments: Able to describe his deficits and that he is not safe to get up and walk alone. Did not recall rt head turns for compensation, but once instructed, he consistently used.  Physical Exam: Blood pressure (!) 141/76, pulse 74, temperature 97.8 F (36.6 C), temperature source Oral, resp. rate 14, height 6' (1.829 m), SpO2 96 %. Gen: no distress, normal appearing HEENT: oral mucosa pink and moist, NCAT Cardio: Reg rate Chest: normal effort, normal rate of breathing Abd: soft, non-distended Ext: no edema Psych: pleasant, normal affect Skin: intact Neurological:     Comments: Patient is alert.  Right homonymous hemianopsia.  Makes eye contact with examiner he does have some word finding difficulties and paraphasic errors.  He has some difficulty naming repetition.  Follows simple demonstrated commands. 5/5 strength throughout, sensation intact throughout. No dysmetria  Results for orders placed or performed during the hospital encounter of 01/11/21 (from the past 48 hour(s))  Basic metabolic panel     Status: Abnormal   Collection Time: 01/16/21 12:33 AM  Result Value Ref Range   Sodium 137 135 - 145 mmol/L   Potassium 4.3 3.5 - 5.1 mmol/L   Chloride 105 98 - 111 mmol/L   CO2 25 22 - 32 mmol/L   Glucose, Bld 102 (H) 70 - 99 mg/dL    Comment: Glucose reference range applies only to samples taken after fasting for at least 8 hours.   BUN 24 (H) 8 - 23 mg/dL   Creatinine, Ser 0.94 0.61 - 1.24 mg/dL   Calcium 9.7  8.9 - 10.3 mg/dL   GFR, Estimated >60 >60 mL/min    Comment: (NOTE) Calculated using the CKD-EPI Creatinine Equation (2021)    Anion gap 7 5 - 15    Comment: Performed at [facility] [address], [place], [place] [address]  CBC     Status: None   Collection Time: 01/16/21 12:33 AM  Result Value Ref Range   WBC 5.7 4.0 - 10.5 K/uL   RBC 4.42 4.22 - 5.81 MIL/uL   Hemoglobin 14.6 13.0 - 17.0 g/dL   HCT 43.3 39.0 - 52.0 %   MCV 98.0 80.0 - 100.0 fL   MCH 33.0 26.0 - 34.0 pg   MCHC 33.7 30.0 - 36.0 g/dL   RDW 12.0 11.5 - 15.5 %   Platelets 195 150 - 400 K/uL   nRBC 0.0 0.0 - 0.2 %    Comment: Performed at [facility], [place] [address], [place], [place] [address]  Magnesium     Status: None   Collection Time: 01/16/21 12:33 AM  Result Value Ref Range   Magnesium 2.0 1.7 - 2.4 mg/dL    Comment: Performed at [facility] [address], [place], [place] [address]  Basic metabolic panel     Status: Abnormal  Collection Time: 01/17/21  1:21 AM  Result Value Ref Range   Sodium 136 135 - 145 mmol/L   Potassium 4.0 3.5 - 5.1 mmol/L   Chloride 104 98 - 111 mmol/L   CO2 25 22 - 32 mmol/L   Glucose, Bld 104 (H) 70 - 99 mg/dL    Comment: Glucose reference range applies only to samples taken after fasting for at least 8 hours.   BUN 24 (H) 8 - 23 mg/dL   Creatinine, Ser 0.92 0.61 - 1.24 mg/dL   Calcium 9.3 8.9 - 10.3 mg/dL   GFR, Estimated >60 >60 mL/min    Comment: (NOTE) Calculated using the CKD-EPI Creatinine Equation (2021)    Anion gap 7 5 - 15    Comment: Performed at [facility] [address], [place], [place] [address]  CBC     Status: None   Collection Time: 01/17/21  1:21 AM  Result Value Ref Range   WBC 6.0 4.0 - 10.5 K/uL   RBC 4.31 4.22 - 5.81 MIL/uL   Hemoglobin 14.4 13.0 - 17.0 g/dL   HCT 42.0 39.0 - 52.0 %   MCV 97.4 80.0 - 100.0 fL   MCH 33.4 26.0 - 34.0 pg   MCHC 34.3 30.0 - 36.0 g/dL   RDW 11.9 11.5 - 15.5 %   Platelets 206 150  - 400 K/uL   nRBC 0.0 0.0 - 0.2 %    Comment: Performed at [facility], [place] [address] [place], [place] [address]  Magnesium     Status: None   Collection Time: 01/17/21  1:21 AM  Result Value Ref Range   Magnesium 2.0 1.7 - 2.4 mg/dL    Comment: Performed at [facility] [address] [place], [place] [address]   No results found.     Medical Problem List and Plan: 1.  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  Small area of acute ischemia in the left centrum semiovale likely from small vessel disease as well as history of left parietal parenchymal ICH 2011  -patient may shower  -ELOS/Goals: 5 days modI  -Admit to CIR 2.  Antithrombotics: -DVT/anticoagulation:  SCD  -antiplatelet therapy: N/A 3. Pain: Decrease Oxycodone to 5mg  q6H prn/tramadol as needed 4. Mood: Provide emotional support  -antipsychotic agents: N/A 5. Neuropsych: This patient is not capable of making decisions on his own behalf. 6. Skin/Wound Care: Routine skin checks 7. Fluids/Electrolytes/Nutrition: Routine in and outs with follow-up chemistries 8.  Hypertension.  Very well controlled, continue Avapro 150 mg daily.  Monitor with increased mobility 9.  Asthma.  Chronic prednisone 5 mg daily.  Continue nebulizers. 10.  Hyperlipidemia.  LDL 130, TG 42, HDL 112, VLDL 8, total cholesterol 250, Total cholesterol/HDL ratio is 2.2. Discuss risks and benefits of continuing Zetia 11.  History of alcohol use.  Alcohol screen negative.  Provide counseling   I have personally performed a face to face diagnostic evaluation, including, but not limited to relevant history and physical exam findings, of this patient and developed relevant assessment and plan.  Additionally, I have reviewed and concur with the physician assistant's documentation above.  Leeroy Cha, MD  Lavon Paganini Angiulli, PA-C

## 2021-01-17 NOTE — Discharge Summary (Signed)
Physician Discharge Summary  Shelden Raborn ZJQ:[id number] DOB: 31-Jan-1946 DOA: 01/11/2021  PCP: Janith Lima, MD  Admit date: 01/11/2021 Discharge date: 01/17/2021  Admitted From: Home Disposition: CIR  Recommendations for Outpatient Follow-up:  Follow up with PCP in 1-2 weeks Please obtain BMP/CBC in one week your next doctors visit.  Avoid anticoagulation antiplatelets Daily Zetia ordered Goal systolic blood pressure less than 160 Outpatient neurology follow-up in 3-4 weeks  Discharge Condition: Stable CODE STATUS: Full code Diet recommendation: Regular  Brief/Interim Summary: 75 year old with history of hemorrhagic CVA, HLD, HTN, asthma who presented with confusion, visual disturbance and aphasia.  MRI was positive for left occipital intraparenchymal hemorrhage with acute ischemic stroke to the left central ovale along with chronic hemorrhage over posterior left lobe.  Patient was admitted to the ICU under neurology service and transferred to [facility].  PT recommended CIR, arrangements were made by [facility] and CIR team.  Details of plan as listed below Patient is medically stable for discharge today.     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  Repeat scans were unchanged.  -LDL 130, A1c 5.6 - Avoid antiplatelets and anticoagulation - Repeat CT head there is a status change - Echocardiogram-EF 55 to 60%, grade 1 DD -PT/OT-CIR. arrangements made by [facility] -Outpatient neurology follow-up in 3-4 weeks   Essential hypertension - Systolic goal was less than 160. Avapro 150mg  po daily. I can add further medications as necessary   Hyperlipidemia - LDL 130.  Intolerant to Lipitor therefore on Zetia   History of asthma -As needed bronchodilators      Body mass  index is 22.43 kg/m.         Discharge Diagnoses:  Active Problems:   Hemorrhagic stroke ([place])   ICH (intracerebral hemorrhage) ([place])      Consultations: Neurology  Subjective: Sitting up in the chair, no complaints.   Discharge Exam: Vitals:   01/17/21 0342 01/17/21 0846  BP: 107/69 120/75  Pulse: 75 88  Resp: 19   Temp: 97.6 F (36.4 C) 97.6 F (36.4 C)  SpO2: 94% 90%   Vitals:   01/16/21 2105 01/16/21 2345 01/17/21 0342 01/17/21 0846  BP: 140/89 (!) 154/96 107/69 120/75  Pulse: 66 71 75 88  Resp: 18 20 19    Temp: 97.8 F (36.6 C) 97.6 F (36.4 C) 97.6 F (36.4 C) 97.6 F (36.4 C)  TempSrc: Oral Oral Oral Axillary  SpO2: 95% 95% 94% 90%  Weight:      Height:        General: Pt is alert, awake, not in acute distress Cardiovascular: RRR, S1/S2 +, no rubs, no gallops Respiratory: CTA bilaterally, no wheezing, no rhonchi Abdominal: Soft, NT, ND, bowel sounds + Extremities: no edema, no cyanosis  Discharge Instructions  Discharge Instructions     Ambulatory referral to Neurology   Complete by: As directed    Follow up with stroke clinic NP (Jessica Vanschaick or Cecille Rubin, if both not available, consider Zachery Dauer, or A[facility]) at [facility] in about 4 weeks. Thanks.      Allergies as of 01/17/2021       Reactions   Amlodipine    edema   Crestor [rosuvastatin]    Muscle aches   Livalo [pitavastatin]    Muscle aches   Bystolic [nebivolol Hcl]  dizziness        Medication List     STOP taking these medications    olmesartan 20 MG tablet Commonly known as: BENICAR       TAKE these medications    albuterol 108 (90 Base) MCG/ACT inhaler Commonly known as: VENTOLIN HFA Inhale 2 puffs into the lungs every 6 (six) hours as needed for wheezing or shortness of breath.   ezetimibe 10 MG tablet Commonly known as: ZETIA Take 1 tablet (10 mg total) by mouth daily. Start taking on: January 18, 2021   fluticasone-salmeterol 250-50  MCG/ACT Aepb Commonly known as: Advair Diskus INHALE 1 INHALATION TWICE DAILY *RINSE MOUTH AFTERWARDS*   irbesartan 150 MG tablet Commonly known as: AVAPRO Take 1 tablet (150 mg total) by mouth daily. Start taking on: January 18, 2021   montelukast 10 MG tablet Commonly known as: SINGULAIR TAKE 1 TABLET BY MOUTH EVERYDAY AT BEDTIME What changed: See the new instructions.   pantoprazole 40 MG tablet Commonly known as: PROTONIX Take 1 tablet (40 mg total) by mouth daily before breakfast.   predniSONE 5 MG tablet Commonly known as: DELTASONE Take 5 mg by mouth daily.   senna-docusate 8.6-50 MG tablet Commonly known as: Senokot-S Take 1 tablet by mouth 2 (two) times daily.        Follow-up Information     [name]. Schedule an appointment as soon as possible for a visit in 1 month(s).   Specialty: Neurology Why: stroke clinic Contact information: [address] [place] [phone]        Janith Lima, MD Follow up in 1 week(s).   Specialty: Internal Medicine Contact information: [place] [address] [phone]                Allergies  Allergen Reactions   Amlodipine     edema   Crestor [Rosuvastatin]     Muscle aches   Livalo [Pitavastatin]     Muscle aches   Bystolic [Nebivolol Hcl]     dizziness    You were cared for by a hospitalist during your hospital stay. If you have any questions about your discharge medications or the care you received while you were in the hospital after you are discharged, you can call the unit and asked to speak with the hospitalist on call if the hospitalist that took care of you is not available. Once you are discharged, your primary care physician will handle any further medical issues. Please note that no refills for any discharge medications will be authorized once you are discharged, as it is imperative that you return to your primary  care physician (or establish a relationship with a primary care physician if you do not have one) for your aftercare needs so that they can reassess your need for medications and monitor your lab values.   Procedures/Studies: CT ANGIO HEAD W OR WO CONTRAST  Result Date: 01/12/2021 : CLINICAL DATA:   Intracranial hemorrhage EXAM: CT ANGIOGRAPHY HEAD AND NECK TECHNIQUE: Multidetector CT imaging of the head and neck was performed using the standard protocol during bolus administration of intravenous contrast. Multiplanar CT image reconstructions and MIPs were obtained to evaluate the vascular anatomy. Carotid stenosis measurements (when applicable) are obtained utilizing NASCET criteria, using the distal internal carotid diameter as the denominator. CONTRAST:   73mL OMNIPAQUE IOHEXOL 350 MG/ML SOLN COMPARISON:   MRI head and CT head 01/11/2021 FINDINGS: CT HEAD FINDINGS Brain: Left occipital  high-density hematoma unchanged in size measuring approximately 3.7 x 2.4 cm. Mild surrounding edema. Mild adjacent subarachnoid hemorrhage is unchanged. Ventricle size normal. No midline shift Moderate white matter hypodensity diffusely is chronic and unchanged from prior studies. This is most likely due to chronic microvascular ischemia. No acute ischemic infarct or mass Vascular: Negative for hyperdense vessel Skull: Negative Sinuses: Mucosal edema throughout the paranasal sinuses. Air-fluid level right frontal sinus and sphenoid sinus. Complete opacification right maxillary sinus. Bony thickening of the maxillary sinus bilaterally in the sphenoid sinus. Mastoid sinus clear on the right with mild left mastoid effusion. Orbits: Negative Review of the MIP images confirms the above findings CTA NECK FINDINGS Aortic arch: Standard branching. Imaged portion shows no evidence of aneurysm or dissection. No significant stenosis of the major arch vessel origins. Atherosclerotic calcification in the aortic arch and proximal left  subclavian artery Right carotid system: Mild atherosclerotic disease right carotid bifurcation. No right carotid stenosis or dissection Left carotid system: Mild atherosclerotic calcification left carotid bifurcation. Negative for stenosis or dissection Vertebral arteries: Right vertebral artery dominant and widely patent to the basilar. Small left vertebral artery with small contribution to the basilar. Skeleton: Cervical spondylosis without acute abnormality. Other neck: Negative for mass or adenopathy in the neck Upper chest: Mild apical scarring bilaterally. Remaining lung apices clear. Review of the MIP images confirms the above findings CTA HEAD FINDINGS Anterior circulation: Atherosclerotic calcification in the cavernous carotid on the right with mild stenosis of the supraclinoid segment. Mild atherosclerotic disease left cavernous carotid without stenosis. Anterior and middle cerebral arteries widely patent. No stenosis, large vessel occlusion, or vascular malformation Posterior circulation: Right vertebral artery is widely patent with mild atherosclerotic distally. Right vertebral artery is dominant. Small left vertebral artery. Basilar is widely patent. Superior cerebellar and posterior cerebral arteries are patent. No large vessel occlusion. Negative for vascular malformation related to the left occipital hematoma. Venous sinuses: Normal vascular enhancement Anatomic variants: None Review of the MIP images confirms the above findings IMPRESSION: 1. Left occipital hematoma unchanged in size. Small adjacent subarachnoid hemorrhage unchanged. Differential diagnosis includes hypertensive hemorrhage versus cerebral amyloid. No underlying vascular malformation on CTA 2. No significant carotid or vertebral artery stenosis in the neck. Electronically Signed By: Franchot Gallo M.D. On: 01/12/2021 13:48 Electronically Signed   By: Franchot Gallo M.D.   On: 01/12/2021 14:09   CT HEAD WO CONTRAST  Result Date:  01/11/2021 CLINICAL DATA:  Intracranial hemorrhage follow-up EXAM: CT HEAD WITHOUT CONTRAST TECHNIQUE: Contiguous axial images were obtained from the base of the skull through the vertex without intravenous contrast. COMPARISON:  None. FINDINGS: Brain: Unchanged appearance of left occipital intraparenchymal hematoma with mild surrounding edema. There is periventricular hypoattenuation compatible with chronic microvascular disease. Unchanged appearance of multiple old infarcts. Generalized volume loss. Vascular: No abnormal hyperdensity of the major intracranial arteries or dural venous sinuses. No intracranial atherosclerosis. Skull: The visualized skull base, calvarium and extracranial soft tissues are normal. Sinuses/Orbits: Small amount of left mastoid fluid. Opacification of the right maxillary sinus and bilateral anterior ethmoid air cells. Partial opacification of the right frontal sinus. The orbits are normal. IMPRESSION: 1. Unchanged appearance of left occipital intraparenchymal hematoma with mild surrounding edema. 2. Unchanged appearance of multiple old infarcts and chronic microvascular ischemia. 3. Paranasal sinus disease. Electronically Signed   By: Ulyses Jarred M.D.   On: 01/11/2021 21:54   CT HEAD WO CONTRAST  Result Date: 01/11/2021 CLINICAL DATA:  Aphasia EXAM: CT HEAD WITHOUT CONTRAST TECHNIQUE:  Contiguous axial images were obtained from the base of the skull through the vertex without intravenous contrast. COMPARISON:  04/11/2010 FINDINGS: Brain: Area of hemorrhage noted in the left occipital lobe measuring 3.7 x 2.8 x 2.6 cm, volume of 13.5 mL. No mass effect or midline shift. Area of encephalomalacia within the posterior left parietal lobe in area of prior hemorrhage. There is atrophy and chronic small vessel disease changes. Vascular: No hyperdense vessel or unexpected calcification. Skull: No acute calvarial abnormality. Sinuses/Orbits: Mucosal thickening throughout the paranasal sinuses,  right greater than left. No air-fluid levels. Other: None IMPRESSION: Area of acute hemorrhage within the left occipital lobe measuring up to 3.7 cm. Atrophy, chronic small vessel disease. Critical Value/emergent results were called by telephone at the time of interpretation on 01/11/2021 at 4:11 pm to provider M[facility] , who verbally acknowledged these results. Electronically Signed   By: Rolm Baptise M.D.   On: 01/11/2021 16:13   CT ANGIO NECK W OR WO CONTRAST  Result Date: 01/12/2021 CLINICAL DATA:  Intracranial hemorrhage EXAM: CT ANGIOGRAPHY HEAD AND NECK TECHNIQUE: Multidetector CT imaging of the head and neck was performed using the standard protocol during bolus administration of intravenous contrast. Multiplanar CT image reconstructions and MIPs were obtained to evaluate the vascular anatomy. Carotid stenosis measurements (when applicable) are obtained utilizing NASCET criteria, using the distal internal carotid diameter as the denominator. CONTRAST:  74mL OMNIPAQUE IOHEXOL 350 MG/ML SOLN COMPARISON:  MRI head and CT head 01/11/2021 FINDINGS: CT HEAD FINDINGS Brain: Left occipital high-density hematoma unchanged in size measuring approximately 3.7 x 2.4 cm. Mild surrounding edema. Mild adjacent subarachnoid hemorrhage is unchanged. Ventricle size normal. No midline shift Moderate white matter hypodensity diffusely is chronic and unchanged from prior studies. This is most likely due to chronic microvascular ischemia. No acute ischemic infarct or mass Vascular: Negative for hyperdense vessel Skull: Negative Sinuses: Mucosal edema throughout the paranasal sinuses. Air-fluid level right frontal sinus and sphenoid sinus. Complete opacification right maxillary sinus. Bony thickening of the maxillary sinus bilaterally in the sphenoid sinus. Mastoid sinus clear on the right with mild left mastoid effusion. Orbits: Negative Review of the MIP images confirms the above findings CTA NECK FINDINGS Aortic arch:  Standard branching. Imaged portion shows no evidence of aneurysm or dissection. No significant stenosis of the major arch vessel origins. Atherosclerotic calcification in the aortic arch and proximal left subclavian artery Right carotid system: Mild atherosclerotic disease right carotid bifurcation. No right carotid stenosis or dissection Left carotid system: Mild atherosclerotic calcification left carotid bifurcation. Negative for stenosis or dissection Vertebral arteries: Right vertebral artery dominant and widely patent to the basilar. Small left vertebral artery with small contribution to the basilar. Skeleton: Cervical spondylosis without acute abnormality. Other neck: Negative for mass or adenopathy in the neck Upper chest: Mild apical scarring bilaterally. Remaining lung apices clear. Review of the MIP images confirms the above findings CTA HEAD FINDINGS Anterior circulation: Atherosclerotic calcification in the cavernous carotid on the right with mild stenosis of the supraclinoid segment. Mild atherosclerotic disease left cavernous carotid without stenosis. Anterior and middle cerebral arteries widely patent. No stenosis, large vessel occlusion, or vascular malformation Posterior circulation: Right vertebral artery is widely patent with mild atherosclerotic distally. Right vertebral artery is dominant. Small left vertebral artery. Basilar is widely patent. Superior cerebellar and posterior cerebral arteries are patent. No large vessel occlusion. Negative for vascular malformation related to the left occipital hematoma. Venous sinuses: Normal vascular enhancement Anatomic variants: None Review of the MIP  images confirms the above findings IMPRESSION: 1. Left occipital hematoma unchanged in size. Small adjacent subarachnoid hemorrhage unchanged. Differential diagnosis includes hypertensive hemorrhage versus cerebral amyloid. No underlying vascular malformation on CTA 2. No significant carotid or vertebral  artery stenosis in the neck. Electronically Signed   By: Franchot Gallo M.D.   On: 01/12/2021 13:48   MR BRAIN W WO CONTRAST  Result Date: 01/12/2021 CLINICAL DATA:  Intracranial hemorrhage follow up EXAM: MRI HEAD WITHOUT AND WITH CONTRAST TECHNIQUE: Multiplanar, multiecho pulse sequences of the brain and surrounding structures were obtained without and with intravenous contrast. CONTRAST:  53mL GADAVIST GADOBUTROL 1 MMOL/ML IV SOLN COMPARISON:  Head CT 01/11/2021 FINDINGS: Brain: Small area of abnormal diffusion restriction in the left centrum semiovale. Large left occipital intraparenchymal hematoma is unchanged allowing for differences in modality. Evidence of chronic hemorrhage over the posterior left parietal lobe. Hyperintense T2-weighted signal is moderately widespread throughout the white matter. Generalized volume loss without a clear lobar predilection. The midline structures are normal. There is no abnormal contrast enhancement. Vascular: Major flow voids are preserved. Skull and upper cervical spine: Normal calvarium and skull base. Visualized upper cervical spine and soft tissues are normal. Sinuses/Orbits:Bilateral mastoid fluid with right maxillary and bilateral ethmoid sinus mucosal thickening. Normal orbits. IMPRESSION: 1. Unchanged size of left occipital intraparenchymal hematoma allowing for differences in modality. 2. Small area of acute ischemia in the left centrum semiovale. 3. Chronic microvascular ischemia and volume loss. Electronically Signed   By: Ulyses Jarred M.D.   On: 01/12/2021 00:01   ECHOCARDIOGRAM COMPLETE  Result Date: 01/13/2021    ECHOCARDIOGRAM REPORT   Patient Name:   Zayvon Latu Date of Exam: 01/13/2021 Medical Rec #:  [id number]     Height:       73.0 in Accession #:    [id number]    Weight:       170.0 lb Date of Birth:  1946/01/21      BSA:          2.008 m Patient Age:    75 years      BP:           123/69 mmHg Patient Gender: M             HR:           72 bpm.  Exam Location:  Inpatient Procedure: 2D Echo, Cardiac Doppler and Color Doppler Indications:    Stroke I63.9  History:        Patient has prior history of Echocardiogram examinations, most                 recent 02/03/2016.  Sonographer:    Merrie Roof RDCS Referring Phys: [place]  1. Left ventricular ejection fraction, by estimation, is 55 to 60%. The left ventricle has normal function. Left ventricular endocardial border not optimally defined to evaluate regional wall motion. Left ventricular diastolic parameters are consistent with Grade I diastolic dysfunction (impaired relaxation).  2. Right ventricular systolic function is normal. The right ventricular size is normal. Tricuspid regurgitation signal is inadequate for assessing PA pressure.  3. The mitral valve is grossly normal. No evidence of mitral valve regurgitation. No evidence of mitral stenosis.  4. The aortic valve is calcified. Aortic valve regurgitation is trivial. Mild aortic valve stenosis. Aortic valve mean gradient measures 15.0 mmHg. Aortic valve Vmax measures 2.57 m/s. Decreased DVI 0.30.  5. The inferior vena cava is normal in size with greater than 50%  respiratory variability, suggesting right atrial pressure of 3 mmHg. Comparison(s): A prior study was performed on 02/03/2016. Increase in aortic valve calcium and gradients. FINDINGS  Left Ventricle: Left ventricular ejection fraction, by estimation, is 55 to 60%. The left ventricle has normal function. Left ventricular endocardial border not optimally defined to evaluate regional wall motion. The left ventricular internal cavity size was normal in size. There is no left ventricular hypertrophy. Left ventricular diastolic parameters are consistent with Grade I diastolic dysfunction (impaired relaxation). Right Ventricle: The right ventricular size is normal. No increase in right ventricular wall thickness. Right ventricular systolic function is normal. Tricuspid  regurgitation signal is inadequate for assessing PA pressure. Left Atrium: Left atrial size was normal in size. Right Atrium: Right atrial size was normal in size. Pericardium: There is no evidence of pericardial effusion. Mitral Valve: The mitral valve is grossly normal. There is mild thickening of the mitral valve leaflet(s). No evidence of mitral valve regurgitation. No evidence of mitral valve stenosis. Tricuspid Valve: The tricuspid valve is normal in structure. Tricuspid valve regurgitation is trivial. No evidence of tricuspid stenosis. Aortic Valve: The aortic valve is calcified. Aortic valve regurgitation is trivial. Mild aortic stenosis is present. Aortic valve mean gradient measures 15.0 mmHg. Aortic valve peak gradient measures 26.4 mmHg. Aortic valve area, by VTI measures 0.93 cm. Pulmonic Valve: The pulmonic valve was not well visualized. Pulmonic valve regurgitation is not visualized. Aorta: The aortic root is normal in size and structure. Venous: The inferior vena cava is normal in size with greater than 50% respiratory variability, suggesting right atrial pressure of 3 mmHg. IAS/Shunts: The atrial septum is grossly normal.  LEFT VENTRICLE PLAX 2D LVIDd:         5.00 cm  Diastology LVIDs:         3.20 cm  LV e' medial:    6.31 cm/s LV PW:         1.00 cm  LV E/e' medial:  8.6 LV IVS:        0.90 cm  LV e' lateral:   7.72 cm/s LVOT diam:     2.00 cm  LV E/e' lateral: 7.0 LV SV:         47 LV SV Index:   24 LVOT Area:     3.14 cm  RIGHT VENTRICLE RV Basal diam:  3.30 cm LEFT ATRIUM             Index       RIGHT ATRIUM           Index LA diam:        3.40 cm 1.69 cm/m  RA Area:     22.50 cm LA Vol (A2C):   70.5 ml 35.11 ml/m RA Volume:   70.40 ml  35.06 ml/m LA Vol (A4C):   41.6 ml 20.72 ml/m LA Biplane Vol: 54.2 ml 26.99 ml/m  AORTIC VALVE AV Area (Vmax):    0.93 cm AV Area (Vmean):   0.92 cm AV Area (VTI):     0.93 cm AV Vmax:           257.00 cm/s AV Vmean:          180.000 cm/s AV VTI:             0.508 m AV Peak Grad:      26.4 mmHg AV Mean Grad:      15.0 mmHg LVOT Vmax:         76.05 cm/s LVOT Vmean:  52.500 cm/s LVOT VTI:          0.150 m LVOT/AV VTI ratio: 0.30  AORTA Ao Root diam: 3.40 cm MITRAL VALVE MV Area (PHT): 2.91 cm    SHUNTS MV Decel Time: 261 msec    Systemic VTI:  0.15 m MV E velocity: 54.00 cm/s  Systemic Diam: 2.00 cm MV A velocity: 78.00 cm/s MV E/A ratio:  0.69 Rudean Haskell MD Electronically signed by Rudean Haskell MD Signature Date/Time: 01/13/2021/3:28:47 PM    Final      The results of significant diagnostics from this hospitalization (including imaging, microbiology, ancillary and laboratory) are listed below for reference.     Microbiology: Recent Results (from the past 240 hour(s))  SARS CORONAVIRUS 2 (TAT 6-24 HRS) Nasopharyngeal     Status: None   Collection Time: 01/11/21  4:55 PM   Specimen: Nasopharyngeal  Result Value Ref Range Status   SARS Coronavirus 2 NEGATIVE NEGATIVE Final    Comment: (NOTE) SARS-CoV-2 target nucleic acids are NOT DETECTED.  The SARS-CoV-2 RNA is generally detectable in upper and lower respiratory specimens during the acute phase of infection. Negative results do not preclude SARS-CoV-2 infection, do not rule out co-infections with other pathogens, and should not be used as the sole basis for treatment or other patient management decisions. Negative results must be combined with clinical observations, patient history, and epidemiological information. The expected result is Negative.  Fact Sheet for Patients: SugarRoll.be  Fact Sheet for Healthcare Providers: https://www.woods-mathews.com/  This test is not yet approved or cleared by the [place] FDA and  has been authorized for detection and/or diagnosis of SARS-CoV-2 by FDA under an Emergency Use Authorization (EUA). This EUA will remain  in effect (meaning this test can be used) for the duration  of the COVID-19 declaration under Se ction 564(b)(1) of the Act, 21 U.S.C. section 360bbb-3(b)(1), unless the authorization is terminated or revoked sooner.  Performed at [facility], [place] [address] [place], [place] [address]   MRSA Next Gen by PCR, Nasal     Status: None   Collection Time: 01/11/21  8:42 PM   Specimen: Nasal Mucosa; Nasal Swab  Result Value Ref Range Status   MRSA by PCR Next Gen NOT DETECTED NOT DETECTED Final    Comment: (NOTE) The GeneXpert MRSA Assay (FDA approved for NASAL specimens only), is one component of a comprehensive MRSA colonization surveillance program. It is not intended to diagnose MRSA infection nor to guide or monitor treatment for MRSA infections. Test performance is not FDA approved in patients less than 89 years old. Performed at [facility], [place] [address] [place], [place] [id number]      Labs: BNP (last 3 results) No results for input(s): BNP in the last 8760 hours. Basic Metabolic Panel: Recent Labs  Lab 01/11/21 1552 01/11/21 1601 01/12/21 0100 01/15/21 0029 01/16/21 0033 01/17/21 0121  NA 143 135 136 136 137 136  K 4.5 4.3 3.6 4.0 4.3 4.0  CL 107 102 104 103 105 104  CO2 28  --  26 27 25 25   GLUCOSE 146* 143* 109* 97 102* 104*  BUN 17 14 11 23  24* 24*  CREATININE 0.90 0.80 0.85 1.02 0.94 0.92  CALCIUM 10.0  --  9.0 9.2 9.7 9.3  MG  --   --   --  2.2 2.0 2.0   Liver Function Tests: Recent Labs  Lab 01/11/21 1552 01/12/21 0100  AST 22 19  ALT 14 13  ALKPHOS 55 52  BILITOT 1.0 1.4*  PROT 7.4 6.4*  ALBUMIN 4.0 3.3*   No results for input(s): LIPASE, AMYLASE in the last 168 hours. No results for input(s): AMMONIA in the last 168 hours. CBC: Recent Labs  Lab 01/11/21 1552 01/11/21 1601 01/12/21 0100 01/15/21 0029 01/16/21 0033 01/17/21 0121  WBC 5.9  --  6.0 5.2 5.7 6.0  NEUTROABS 4.8  --   --   --   --   --   HGB 14.6 15.3 13.5 14.2 14.6 14.4  HCT 44.1 45.0 40.0 42.0 43.3 42.0  MCV 100.2*   --  98.8 98.6 98.0 97.4  PLT 193  --  182 190 195 206   Cardiac Enzymes: No results for input(s): CKTOTAL, CKMB, CKMBINDEX, TROPONINI in the last 168 hours. BNP: Invalid input(s): POCBNP CBG: No results for input(s): GLUCAP in the last 168 hours. D-Dimer No results for input(s): DDIMER in the last 72 hours. Hgb A1c No results for input(s): HGBA1C in the last 72 hours. Lipid Profile No results for input(s): CHOL, HDL, LDLCALC, TRIG, CHOLHDL, LDLDIRECT in the last 72 hours. Thyroid function studies No results for input(s): TSH, T4TOTAL, T3FREE, THYROIDAB in the last 72 hours.  Invalid input(s): FREET3 Anemia work up No results for input(s): VITAMINB12, FOLATE, FERRITIN, TIBC, IRON, RETICCTPCT in the last 72 hours. Urinalysis    Component Value Date/Time   COLORURINE YELLOW 01/12/2021 0122   APPEARANCEUR CLEAR 01/12/2021 0122   LABSPEC 1.012 01/12/2021 0122   PHURINE 6.0 01/12/2021 0122   GLUCOSEU NEGATIVE 01/12/2021 0122   GLUCOSEU NEGATIVE 06/19/2018 1220   HGBUR NEGATIVE 01/12/2021 0122   BILIRUBINUR NEGATIVE 01/12/2021 0122   KETONESUR NEGATIVE 01/12/2021 0122   PROTEINUR NEGATIVE 01/12/2021 0122   UROBILINOGEN 0.2 06/19/2018 1220   NITRITE NEGATIVE 01/12/2021 0122   LEUKOCYTESUR NEGATIVE 01/12/2021 0122   Sepsis Labs Invalid input(s): PROCALCITONIN,  WBC,  LACTICIDVEN Microbiology Recent Results (from the past 240 hour(s))  SARS CORONAVIRUS 2 (TAT 6-24 HRS) Nasopharyngeal     Status: None   Collection Time: 01/11/21  4:55 PM   Specimen: Nasopharyngeal  Result Value Ref Range Status   SARS Coronavirus 2 NEGATIVE NEGATIVE Final    Comment: (NOTE) SARS-CoV-2 target nucleic acids are NOT DETECTED.  The SARS-CoV-2 RNA is generally detectable in upper and lower respiratory specimens during the acute phase of infection. Negative results do not preclude SARS-CoV-2 infection, do not rule out co-infections with other pathogens, and should not be used as the sole basis  for treatment or other patient management decisions. Negative results must be combined with clinical observations, patient history, and epidemiological information. The expected result is Negative.  Fact Sheet for Patients: SugarRoll.be  Fact Sheet for Healthcare Providers: https://www.woods-mathews.com/  This test is not yet approved or cleared by the [place] FDA and  has been authorized for detection and/or diagnosis of SARS-CoV-2 by FDA under an Emergency Use Authorization (EUA). This EUA will remain  in effect (meaning this test can be used) for the duration of the COVID-19 declaration under Se ction 564(b)(1) of the Act, 21 U.S.C. section 360bbb-3(b)(1), unless the authorization is terminated or revoked sooner.  Performed at [facility], [place] [address] [place], [place] [address]   MRSA Next Gen by PCR, Nasal     Status: None   Collection Time: 01/11/21  8:42 PM   Specimen: Nasal Mucosa; Nasal Swab  Result Value Ref Range Status   MRSA by PCR Next Gen NOT DETECTED NOT DETECTED  Final    Comment: (NOTE) The GeneXpert MRSA Assay (FDA approved for NASAL specimens only), is one component of a comprehensive MRSA colonization surveillance program. It is not intended to diagnose MRSA infection nor to guide or monitor treatment for MRSA infections. Test performance is not FDA approved in patients less than 64 years old. Performed at [facility], [place] [address], [place], [place] [address]      Time coordinating discharge:  I have spent 35 minutes face to face with the patient and on the ward discussing the patients care, assessment, plan and disposition with other care givers. >50% of the time was devoted counseling the patient about the risks and benefits of treatment/Discharge disposition and coordinating care.   SIGNED:   Damita Lack, MD  [facility] 01/17/2021, 11:13 AM   If 7PM-7AM, please  contact night-coverage

## 2021-01-18 DIAGNOSIS — I619 Nontraumatic intracerebral hemorrhage, unspecified: Secondary | ICD-10-CM | POA: Diagnosis not present

## 2021-01-18 LAB — CBC WITH DIFFERENTIAL/PLATELET
Abs Immature Granulocytes: 0.02 10*3/uL (ref 0.00–0.07)
Basophils Absolute: 0.1 10*3/uL (ref 0.0–0.1)
Basophils Relative: 1 %
Eosinophils Absolute: 0.4 10*3/uL (ref 0.0–0.5)
Eosinophils Relative: 9 %
HCT: 41.9 % (ref 39.0–52.0)
Hemoglobin: 14.4 g/dL (ref 13.0–17.0)
Immature Granulocytes: 0 %
Lymphocytes Relative: 26 %
Lymphs Abs: 1.3 10*3/uL (ref 0.7–4.0)
MCH: 33.2 pg (ref 26.0–34.0)
MCHC: 34.4 g/dL (ref 30.0–36.0)
MCV: 96.5 fL (ref 80.0–100.0)
Monocytes Absolute: 0.5 10*3/uL (ref 0.1–1.0)
Monocytes Relative: 11 %
Neutro Abs: 2.6 10*3/uL (ref 1.7–7.7)
Neutrophils Relative %: 53 %
Platelets: 209 10*3/uL (ref 150–400)
RBC: 4.34 MIL/uL (ref 4.22–5.81)
RDW: 11.9 % (ref 11.5–15.5)
WBC: 4.9 10*3/uL (ref 4.0–10.5)
nRBC: 0 % (ref 0.0–0.2)

## 2021-01-18 LAB — COMPREHENSIVE METABOLIC PANEL
ALT: 21 U/L (ref 0–44)
AST: 21 U/L (ref 15–41)
Albumin: 3.4 g/dL — ABNORMAL LOW (ref 3.5–5.0)
Alkaline Phosphatase: 52 U/L (ref 38–126)
Anion gap: 8 (ref 5–15)
BUN: 20 mg/dL (ref 8–23)
CO2: 26 mmol/L (ref 22–32)
Calcium: 9.3 mg/dL (ref 8.9–10.3)
Chloride: 102 mmol/L (ref 98–111)
Creatinine, Ser: 0.97 mg/dL (ref 0.61–1.24)
GFR, Estimated: >60 mL/min (ref >60)
Glucose, Bld: 96 mg/dL (ref 70–99)
Potassium: 3.9 mmol/L (ref 3.5–5.1)
Sodium: 136 mmol/L (ref 135–145)
Total Bilirubin: 1.3 mg/dL — ABNORMAL HIGH (ref 0.3–1.2)
Total Protein: 6.7 g/dL (ref 6.5–8.1)

## 2021-01-18 MED ORDER — MONTELUKAST SODIUM 10 MG PO TABS
10.0000 mg | ORAL_TABLET | Freq: Every day | ORAL | Status: DC
  Administered 2021-01-18 – 2021-01-23 (×6): 10 mg via ORAL
  Filled 2021-01-18 (×6): qty 1

## 2021-01-18 MED ORDER — MOMETASONE FURO-FORMOTEROL FUM 200-5 MCG/ACT IN AERO
2.0000 | INHALATION_SPRAY | Freq: Two times a day (BID) | RESPIRATORY_TRACT | Status: DC
  Administered 2021-01-18 – 2021-01-24 (×11): 2 via RESPIRATORY_TRACT
  Filled 2021-01-18: qty 8.8

## 2021-01-18 MED ORDER — OXYCODONE HCL 5 MG PO TABS: 2.5000 mg | ORAL_TABLET | Freq: Four times a day (QID) | ORAL | Status: DC | PRN

## 2021-01-18 NOTE — Progress Notes (Signed)
Inpatient Rehabilitation  Patient information reviewed and entered into eRehab system by [place] Kristi Hyer, OTR/L.   Information including medical coding, functional ability and quality indicators will be reviewed and updated through discharge.    

## 2021-01-18 NOTE — Progress Notes (Signed)
[facility] Individual Statement of Services  Patient Name:  Lee Blevins  Date:  01/18/2021  Welcome to the [place] [facility].  Our goal is to provide you with an individualized program based on your diagnosis and situation, designed to meet your specific needs.  With this comprehensive rehabilitation program, you will be expected to participate in at least 3 hours of rehabilitation therapies Monday-Friday, with modified therapy programming on the weekends.  Your rehabilitation program will include the following services:  Physical Therapy (PT), Occupational Therapy (OT), Speech Therapy (ST), 24 hour per day rehabilitation nursing, Care Coordinator, Rehabilitation Medicine, Nutrition Services, and Pharmacy Services  Weekly team conferences will be held on Tuesday to discuss your progress.  Your Inpatient Rehabilitation Care Coordinator will talk with you frequently to get your input and to update you on team discussions.  Team conferences with you and your family in attendance may also be held.  Expected length of stay: 5-7 DAYS  Overall anticipated outcome: Supervision due to speech and visual defcitis  Depending on your progress and recovery, your program may change. Your Inpatient Rehabilitation Care Coordinator will coordinate services and will keep you informed of any changes. Your Inpatient Rehabilitation Care Coordinator's name and contact numbers are listed  below.  The following services may also be recommended but are not provided by the [place] will be made to provide these services after discharge if needed.  Arrangements include referral to agencies that provide these services.  Your insurance has been verified to be:  [place] Your primary doctor is:  Lee Blevins  Pertinent information will be shared with  your doctor and your insurance company.  Inpatient Rehabilitation Care Coordinator:  Ovidio Kin, [place] or Emilia Beck  Information discussed with and copy given to patient by: Elease Hashimoto, 01/18/2021, 11:24 AM

## 2021-01-18 NOTE — Progress Notes (Signed)
PROGRESS NOTE   Subjective/Complaints: No complaints this morning Working with Jeneen Rinks Had a good transition to the unit Discussed that labs look good except for low protein levels  ROS: denies pain   Objective:   No results found. Recent Labs    01/17/21 0121 01/18/21 0425  WBC 6.0 4.9  HGB 14.4 14.4  HCT 42.0 41.9  PLT 206 209   Recent Labs    01/17/21 0121 01/18/21 0425  NA 136 136  K 4.0 3.9  CL 104 102  CO2 25 26  GLUCOSE 104* 96  BUN 24* 20  CREATININE 0.92 0.97  CALCIUM 9.3 9.3    Intake/Output Summary (Last 24 hours) at 01/18/2021 0930 Last data filed at 01/18/2021 0720 Gross per 24 hour  Intake 420 ml  Output 1600 ml  Net -1180 ml        Physical Exam: Vital Signs Blood pressure (!) 133/94, pulse 62, temperature 97.8 F (36.6 C), temperature source Oral, resp. rate 14, height 6' (1.829 m), SpO2 95 %. Gen: no distress, normal appearing HEENT: oral mucosa pink and moist, NCAT Cardio: Reg rate Chest: normal effort, normal rate of breathing Abd: soft, non-distended Ext: no edema Psych: pleasant, normal affect Skin: intact Neurological:     Comments: Patient is alert.  Right homonymous hemianopsia.  Makes eye contact with examiner he does have some word finding difficulties and paraphasic errors.  He has some difficulty naming repetition.  Follows simple demonstrated commands. 5/5 strength throughout, sensation intact throughout. No dysmetria   Assessment/Plan: 1. Functional deficits which require 3+ hours per day of interdisciplinary therapy in a comprehensive inpatient rehab setting. Physiatrist is providing close team supervision and 24 hour management of active medical problems listed below. Physiatrist and rehab team continue to assess barriers to discharge/monitor patient progress toward functional and medical goals  Care Tool:  Bathing              Bathing assist       Upper  Body Dressing/Undressing Upper body dressing        Upper body assist Assist Level: Set up assist    Lower Body Dressing/Undressing Lower body dressing            Lower body assist       Toileting Toileting    Toileting assist Assist for toileting: Contact Guard/Touching assist     Transfers Chair/bed transfer  Transfers assist           Locomotion Ambulation   Ambulation assist              Walk 10 feet activity   Assist           Walk 50 feet activity   Assist           Walk 150 feet activity   Assist           Walk 10 feet on uneven surface  activity   Assist           Wheelchair     Assist               Wheelchair 50 feet with 2 turns activity  Assist            Wheelchair 150 feet activity     Assist          Blood pressure (!) 133/94, pulse 62, temperature 97.8 F (36.6 C), temperature source Oral, resp. rate 14, height 6' (1.829 m), SpO2 95 %.    Medical Problem List and Plan: 1.  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  Small area of acute ischemia in the left centrum semiovale likely from small vessel disease as well as history of left parietal parenchymal ICH 2011             -patient may shower             -ELOS/Goals: 5 days modI             -Initial CIR evaluations today.  2.  Antithrombotics: -DVT/anticoagulation:  SCD             -antiplatelet therapy: N/A 3. Pain: Decrease Oxycodone to 5mg  q8H prn/tramadol as needed 4. Mood: Provide emotional support             -antipsychotic agents: N/A 5. Neuropsych: This patient is not capable of making decisions on his own behalf. 6. Skin/Wound Care: Routine skin checks 7. Fluids/Electrolytes/Nutrition: Routine in and outs with follow-up chemistries 8.  Hypertension.  Very well controlled, continue Avapro 150 mg daily.  Monitor  with increased mobility 9.  Asthma.  Chronic prednisone 5 mg daily.  Continue nebulizers. 10.  Hyperlipidemia.  LDL 130, TG 42, HDL 112, VLDL 8, total cholesterol 250, Total cholesterol/HDL ratio is 2.2. Discuss risks and benefits of continuing Zetia and decided to discontinue medicine given association between statins and ICH 11.  History of alcohol use.  Alcohol screen negative.  Provide counseling 12. Low protein levels: encouraged choosing high protein foods.   LOS: 1 days A FACE TO FACE EVALUATION WAS PERFORMED  Martina Brodbeck P Kaelene Elliston 01/18/2021, 9:30 AM

## 2021-01-18 NOTE — Progress Notes (Signed)
Physical Therapy Session Note  Patient Details  Name: Lee Blevins MRN: [id number] Date of Birth: [birth year]  Today's Date: 01/18/2021 PT Individual Time: 1500-1530 PT Individual Time Calculation (min): 30 min   Short Term Goals: Week 1:  PT Short Term Goal 1 (Week 1): STG = LTG d/t ELOS  Skilled Therapeutic Interventions/Progress Updates:  Pt in recliner at start of session and is agreeable to PT tx. No reports of pain. Sit<>stand with CGA and no AD from recliner height - ambulates with CGA and no AD in hallways, ~115ft, to day room rehab gym. Completes 8 minutes of Nustep with workload set to 5, using x4 extremities and maintaining a cadence of ~60 steps/minute. Next, worked on LE strengthening in // bars with 4# ankle weights, completing hip abduction, hamstring curls, hip marches, heel raises, and mini-squats - 1x10 reps each with mirror for visual feedback for technique. Ambulated back to his room with CGA and no AD and then reports need to toilet once in room - continent of bladder while standing with supervision for safety. Completed session seated in recliner with safety belt alarm on, all his needs within reach.   Therapy Documentation Precautions:  Precautions Precautions: Fall Precaution Comments: right visual field deficit Restrictions Weight Bearing Restrictions: No General:    Therapy/Group: Individual Therapy  Alger Simons 01/18/2021, 12:41 PM

## 2021-01-18 NOTE — Progress Notes (Signed)
Inpatient Rehabilitation Care Coordinator Assessment and Plan Patient Details  Name: Lee Blevins MRN: [id number] Date of Birth: April 06, 1946  Today's Date: 01/18/2021  Hospital Problems: Principal Problem:   Intraparenchymal hemorrhage of brain [facility])  Past Medical History:  Past Medical History:  Diagnosis Date   Asthma    Chronic sinusitis    History of CVA (cerebrovascular accident) 2011   hemorrhagic   HTN (hypertension)    Hyperlipidemia    Nasal polyps    Pneumonia    Past Surgical History:  Past Surgical History:  Procedure Laterality Date   NASAL POLYP EXCISION     x2   TONSILLECTOMY     Social History:  reports that he quit smoking about 47 years ago. His smoking use included cigarettes. He has a 1.00 pack-year smoking history. He has never used smokeless tobacco. He reports current alcohol use of about 15.0 standard drinks per week. He reports that he does not use drugs.  Family / Support Systems Marital Status: Married Patient Roles: Spouse, Parent Spouse/Significant Other: [name] [phone] Children: son Other Supports: friends and church members Anticipated Caregiver: [name] Ability/Limitations of Caregiver: Supervision only due to knee issues and uses a Estate manager/land agent Availability: 24/7 Family Dynamics: Close knit with family and friends. He has always been independent and wants to regain this.  Social History Preferred language: English Religion: Unknown Cultural Background: NO issues Education: [place] - How often do you need to have someone help you when you read instructions, pamphlets, or other written material from your doctor or pharmacy?: Never Writes: Yes Employment Status: Retired Public relations account executive Issues: No issues Guardian/Conservator: None-according to MD pt is not fully capable of making his own decisions. Will look toward his wife if any decisions need to be made while here    Abuse/Neglect Abuse/Neglect Assessment Can Be Completed: Yes Physical Abuse: Denies Verbal Abuse: Denies Sexual Abuse: Denies Exploitation of patient/patient's resources: Denies Self-Neglect: Denies  Patient response to: Social Isolation - How often do you feel lonely or isolated from those around you?: Never  Emotional Status Pt's affect, behavior and adjustment status: Pt is motivated to do well and recover from this. He is having visual issues and speech issues, he is moving well. He jokes he has always had speech issues, but it is worse now Recent Psychosocial Issues: other health issues were managed Psychiatric History: No history deferred depression screening due to speech issues and still adjusting to new unit. He may benefit from seeing neuro-psych while here but wil be short length of stay due to mobile Substance Abuse History: No issues  Patient / Family Perceptions, Expectations & Goals Pt/Family understanding of illness & functional limitations: Pt and wife can explain his stroke and deficits. They both talk with the MD and feel they have a good understanding of his treatment plan going forward. Wife plans to be here daily to see his progress. Premorbid pt/family roles/activities: Husband, father, retiree, church member, etc Anticipated changes in roles/activities/participation: resume Pt/family expectations/goals: Pt states: " I hope to do well and get some of my vision back or learn how to adjust to this." Wife states: " He needs to do well I can't physcially assist him."  [employer]: None Premorbid Home Care/DME Agencies: Other (Comment) (has cane and tub seat) Transportation available at discharge: wife pt drove PTA Is the patient able to respond to transportation needs?: Yes In the past 12 months, has lack of transportation kept you from medical appointments or from getting  medications?: No In the past 12 months, has lack of transportation  kept you from meetings, work, or from getting things needed for daily living?: No  Discharge Planning Living Arrangements: Spouse/significant other Support Systems: Spouse/significant other, Children, Friends/neighbors, Church/faith community Type of Residence: Private residence Insurance Resources: [employer], Multimedia programmer (specify) Sports administrator) Financial Resources: Fish farm manager, Family Support Financial Screen Referred: No Living Expenses: Own Money Management: Patient, Spouse Does the patient have any problems obtaining your medications?: No Home Management: Both wife limited due to knee issues Patient/Family Preliminary Plans: Return home with wife who is able to provide supervision level but pt will need to be mobile at discharge. Both report the main issue is his vision and speech. Will await therapy evaluations and work on safe plan Care Coordinator Barriers to Discharge: Decreased caregiver support Care Coordinator Anticipated Follow Up Needs: HH/OP  Clinical Impression Pleasant gentleman who is motivated to do well and recover from this stroke. His wife is involved and will assist but limited due to her knee issues. Pt is mobile already will probably be a short length of stay. Will await therapy evaluations.  Elease Hashimoto 01/18/2021, 11:23 AM

## 2021-01-18 NOTE — Evaluation (Signed)
Physical Therapy Assessment and Plan  Patient Details  Name: Lee Blevins MRN: [id number] Date of Birth: 1945-12-06  PT Diagnosis: Abnormal posture, Abnormality of gait, Cognitive deficits, Difficulty walking, and Impaired cognition Rehab Potential: Good ELOS: 3-6 days   Today's Date: 01/18/2021 PT Individual Time: 1005-1100 PT Individual Time Calculation (min): 85 min    Hospital Problem: Principal Problem:   Intraparenchymal hemorrhage of brain ([place])   Past Medical History:  Past Medical History:  Diagnosis Date   Asthma    Chronic sinusitis    History of CVA (cerebrovascular accident) 2011   hemorrhagic   HTN (hypertension)    Hyperlipidemia    Nasal polyps    Pneumonia    Past Surgical History:  Past Surgical History:  Procedure Laterality Date   NASAL POLYP EXCISION     x2   TONSILLECTOMY      Assessment & Plan Clinical Impression: Patient is a 75 year old right-handed male with history of hypertension, 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  Per chart review patient lives with spouse.  1 level home 2 steps to enter.  Independent driving prior to admission manages his own medications and finances.  Wife is limited physically.  Presented 01/11/2021 with blurred vision/right side visual deficit and altered mental status.  On arrival to the ED systolic blood pressure in the 150s.  CT/MRI showed area of hemorrhage noted to the left occipital lobe measuring 3 x 7 x 2 0.8 x 2.6 cm, volume of 13.5 mL.  No mass-effect or midline shift.  Area of encephalomalacia within the posterior left parietal lobe and area of prior hemorrhage.  CT angiogram head and neck no significant carotid or vertebral artery stenosis.  Follow-up imaging remained unchanged in size of left occipital intraparenchymal hematoma.  Echocardiogram with  ejection fraction of 55 to 82% grade 1 diastolic dysfunction.  Admission chemistries unremarkable except glucose 146, alcohol negative, urine drug screen negative.  Tolerating a regular diet.  Therapy evaluations completed due to patient decreased functional mobility was admitted for a comprehensive rehab program. Patient transferred to CIR on 01/17/2021 .   Patient currently requires min with mobility secondary to muscle weakness, decreased cardiorespiratoy endurance, field cut, and decreased safety awareness.  Prior to hospitalization, patient was independent  with mobility and lived with Spouse in a House home.  Home access is 2-3Stairs to enter.  Patient will benefit from skilled PT intervention to maximize safe functional mobility, minimize fall risk, and decrease caregiver burden for planned discharge home with 24 hour supervision.  Anticipate patient will benefit from follow up OP at discharge.  PT - End of Session Activity Tolerance: Tolerates 30+ min activity with multiple rests Endurance Deficit: Yes Endurance Deficit Description: mild and brief rest breaks b/w functional mobility tasks PT Assessment Rehab Potential (ACUTE/IP ONLY): Good PT Barriers to Discharge: Insurance for SNF coverage;Lack of/limited family support;Other (comments) PT Barriers to Discharge Comments: R visual field deficits PT Patient demonstrates impairments in the following area(s): Balance;Endurance;Motor;Safety PT Transfers Functional Problem(s): Bed Mobility;Bed to Chair;Car PT Locomotion Functional Problem(s): Ambulation;Stairs PT Plan PT Intensity: Minimum of 1-2 x/day ,45 to 90 minutes PT Frequency: 5 out of 7 days PT Duration Estimated Length of Stay: 3-6 days PT Treatment/Interventions: Ambulation/gait training;Cognitive remediation/compensation;Discharge planning;DME/adaptive equipment instruction;Functional mobility training;Pain management;Psychosocial support;Therapeutic Activities;UE/LE Strength  taining/ROM;Visual/perceptual remediation/compensation;UE/LE Coordination activities;Therapeutic Exercise;Stair training;Skin care/wound management;Patient/family education;Neuromuscular re-education;Disease management/prevention;Community reintegration;Balance/vestibular training  PT Transfers Anticipated Outcome(s): mod I PT Locomotion Anticipated Outcome(s): supervision PT Recommendation Follow Up Recommendations: Outpatient PT;24 hour supervision/assistance Patient destination: Home Equipment Recommended: To be determined   PT Evaluation Precautions/Restrictions Precautions Precautions: Fall Precaution Comments: right visual field deficit Restrictions Weight Bearing Restrictions: No Pain Pain Assessment Pain Scale: 0-10 Pain Score: 0-No pain Pain Interference Pain Interference Pain Effect on Sleep: 0. Does not apply - I have not had any pain or hurting in the past 5 days Pain Interference with Therapy Activities: 0. Does not apply - I have not received rehabilitationtherapy in the past 5 days Pain Interference with Day-to-Day Activities: 1. 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 Vision/Perception  Vision - History Ability to See in Adequate Light: 0 Adequate Vision - Assessment Eye Alignment: Within Functional Limits Ocular Range of Motion: Within Functional Limits Alignment/Gaze Preference: Within Defined  Limits Tracking/Visual Pursuits: Decreased smoothness of horizontal tracking;Decreased smoothness of vertical tracking Saccades: Decreased speed of saccadic movement;Impaired - to be further tested in functional context Convergence: Within functional limits Perception Perception: Within Functional Limits Praxis Praxis: Intact  Cognition Overall Cognitive Status: Impaired/Different from baseline Arousal/Alertness: Awake/alert Orientation Level: Oriented to person;Oriented to place;Disoriented to time Year: 2021 Month: October Day of Week: Incorrect Sensation Sensation Light Touch: Appears Intact Hot/Cold: Appears Intact Proprioception: Appears Intact Stereognosis: Appears Intact Coordination Gross Motor Movements are Fluid and Coordinated: Yes Fine Motor Movements are Fluid and Coordinated: Yes Finger Nose Finger Test: [facility] Heel Shin Test: [facility] Motor  Motor Motor: Abnormal postural alignment and control Motor - Skilled Clinical Observations: generalized weakness and deconditioning   Trunk/Postural Assessment  Cervical Assessment Cervical Assessment: Exceptions to [facility] (forward head) Thoracic Assessment Thoracic Assessment: Exceptions to [facility] (rounded shoulders) Lumbar Assessment Lumbar Assessment: Exceptions to [facility] (posterior pelvic tilt) Postural Control Postural Control: Within Functional Limits  Balance Balance Balance Assessed: Yes Standardized Balance Assessment Standardized Balance Assessment: Berg Balance Test Berg Balance Test Sit to Stand: Able to stand without using hands and stabilize independently Standing Unsupported: Able to stand safely 2 minutes Sitting with Back Unsupported but Feet Supported on Floor or Stool: Able to sit safely and securely 2 minutes Stand to Sit: Sits safely with minimal use of hands Transfers: Able to transfer safely, minor use of hands Standing Unsupported with Eyes Closed: Able to stand 10 seconds safely Standing Ubsupported with  Feet Together: Able to place feet together independently and stand 1 minute safely From Standing, Reach Forward with Outstretched Arm: Can reach forward >12 cm safely (5") From Standing Position, Pick up Object from Floor: Able to pick up shoe safely and easily From Standing Position, Turn to Look Behind Over each Shoulder: Turn sideways only but maintains balance Turn 360 Degrees: Able to turn 360 degrees safely in 4 seconds or less Standing Unsupported, Alternately Place Feet on Step/Stool: Needs assistance to keep from falling or unable to try Standing Unsupported, One Foot in Front: Able to plae foot ahead of the other independently and hold 30 seconds Standing on One Leg: Able to lift leg independently and hold 5-10 seconds Total Score:  47 Extremity Assessment  RLE Assessment RLE Assessment: Exceptions to [facility] General Strength Comments: Grossly 4/5 LLE Assessment LLE Assessment: Exceptions to [facility] General Strength Comments: Grossly 4/5  Care Tool Care Tool Bed Mobility Roll left and right activity   Roll left and right assist level: Supervision/Verbal cueing    Sit to lying activity   Sit to lying assist level: Supervision/Verbal cueing    Lying to sitting on side of bed activity   Lying to sitting on side of bed assist level: the ability to move from lying on the back to sitting on the side of the bed with no back support.: Supervision/Verbal cueing     Care Tool Transfers Sit to stand transfer   Sit to stand assist level: Contact Guard/Touching assist    Chair/bed transfer   Chair/bed transfer assist level: Contact Guard/Touching assist     Toilet transfer        Car transfer   Car transfer assist level: Contact Guard/Touching assist      Care Tool Locomotion Ambulation   Assist level: Minimal Assistance - Patient > 75% Assistive device: No Device Max distance: 250'  Walk 10 feet activity   Assist level: Minimal Assistance - Patient > 75% Assistive device: No  Device   Walk 50 feet with 2 turns activity   Assist level: Minimal Assistance - Patient > 75% Assistive device: No Device  Walk 150 feet activity   Assist level: Minimal Assistance - Patient > 75% Assistive device: No Device  Walk 10 feet on uneven surfaces activity   Assist level: Minimal Assistance - Patient > 75%    Stairs   Assist level: Contact Guard/Touching assist Stairs assistive device: 2 hand rails Max number of stairs: 12  Walk up/down 1 step activity   Walk up/down 1 step (curb) assist level: Contact Guard/Touching assist Walk up/down 1 step or curb assistive device: 2 hand rails    Walk up/down 4 steps activity Walk up/down 4 steps assist level: Contact Guard/Touching assist Walk up/down 4 steps assistive device: 2 hand rails  Walk up/down 12 steps activity   Walk up/down 12 steps assist level: Contact Guard/Touching assist Walk up/down 12 steps assistive device: 2 hand rails  Pick up small objects from floor   Pick up small object from the floor assist level: Contact Guard/Touching assist    Wheelchair Is the patient using a wheelchair?: No          Wheel 50 feet with 2 turns activity      Wheel 150 feet activity        Refer to Care Plan for Long Term Goals  SHORT TERM GOAL WEEK 1 PT Short Term Goal 1 (Week 1): STG = LTG d/t ELOS  Recommendations for other services: None   Skilled Therapeutic Intervention Mobility Bed Mobility Bed Mobility: Rolling Right;Rolling Left;Sit to Supine;Supine to Sit Rolling Right: Supervision/verbal cueing Rolling Left: Supervision/Verbal cueing Supine to Sit: Supervision/Verbal cueing Sit to Supine: Supervision/Verbal cueing Transfers Transfers: Sit to Stand;Stand Pivot Transfers;Stand to Sit Sit to Stand: Contact Guard/Touching assist Stand to Sit: Contact Guard/Touching assist Stand Pivot Transfers: Contact Guard/Touching assist Transfer (Assistive device): None Locomotion  Gait Ambulation: Yes Gait  Assistance: Contact Guard/Touching assist;Minimal Assistance - Patient > 75% Gait Distance (Feet): 250 Feet Assistive device: None Gait Assistance Details: Verbal cues for gait pattern;Verbal cues for precautions/safety Gait Assistance Details: MinA only needed for turning through doorways to avoid R sided obstacles Gait Gait: Yes Gait Pattern: Impaired Gait Pattern: Step-through  pattern;Decreased step length - right;Decreased step length - left;Narrow base of support;Poor foot clearance - left;Poor foot clearance - right Gait velocity: decreased Stairs / Additional Locomotion Stairs: Yes Stairs Assistance: Contact Guard/Touching assist Stair Management Technique: Two rails Number of Stairs: 12 Height of Stairs: 6 Ramp: Contact Guard/touching assist Wheelchair Mobility Wheelchair Mobility: No  Skilled Intervention: Pt sitting upright in recliner at start of session. Handoff of care from OT. Pt agreeable to PT evaluation with functional mobility initiated and outlined above. Pt pleasant and cooperative throughout session, A&Ox3 (disoriented to time, stated "2021"). Pt overall requires minA for most mobility tasks due to balance and visual impairments. He completes sit<>stand transfers with CGA, ambulates >264f with CGA/minA and no AD in hallways, and can navigate up/down x12 steps with CGA and 2 hand rails. He required min cues while navigating doorways to avoid running into door frame on his R side due to visual field deficit. Educated on "lighthouse" technique and increasing safety awareness to reduce risk of injury. He completed BERG balance test as outlined above, scoring 47/56. He completed session seated in recliner with safety belt alarm on and all needs in reach. Wife entering at end of session and she was updated on pt's mobility, PT POC, PT goals, and general CIR policies   Patient demonstrates increased fall risk as noted by score of   47/56 on Berg Balance Scale.  (<36= high risk  for falls, close to 100%; 37-45 significant >80%; 46-51 moderate >50%; 52-55 lower >25%)   Instructed pt in results of PT evaluation as detailed above, PT POC, rehab potential, rehab goals, and discharge recommendations. Additionally discussed CIR's policies regarding fall safety and use of chair alarm and/or quick release belt. Pt verbalized understanding and in agreement. Will update pt's family members as they become available.   Discharge Criteria: Patient will be discharged from PT if patient refuses treatment 3 consecutive times without medical reason, if treatment goals not met, if there is a change in medical status, if patient makes no progress towards goals or if patient is discharged from hospital.  The above assessment, treatment plan, treatment alternatives and goals were discussed and mutually agreed upon: by patient  CAlger SimonsPT, DPT 01/18/2021, 12:29 PM

## 2021-01-18 NOTE — Progress Notes (Signed)
Patient ID: Lee Blevins, male   DOB: [birth year], 75 y.o.   MRN: [id number] Met with pt and spoke with wife to give both update regarding team conference goals of supervision level and discharge 10/8. Wife plans to take a break tomorrow and come in Thursday. Will await team's recommendations and work on discharge needs.

## 2021-01-18 NOTE — Evaluation (Signed)
Speech Language Pathology Assessment and Plan  Patient Details  Name: Lee Blevins MRN: [id number] Date of Birth: 02-25-1946  SLP Diagnosis: Aphasia;Cognitive Impairments  Rehab Potential: Excellent ELOS: 10/11   Today's Date: 01/18/2021 SLP Individual Time: 1305-1405 SLP Individual Time Calculation (min): 48 min  Hospital Problem: Principal Problem:   Intraparenchymal hemorrhage of brain ([place])  Past Medical History:  Past Medical History:  Diagnosis Date   Asthma    Chronic sinusitis    History of CVA (cerebrovascular accident) 2011   hemorrhagic   HTN (hypertension)    Hyperlipidemia    Nasal polyps    Pneumonia    Past Surgical History:  Past Surgical History:  Procedure Laterality Date   NASAL POLYP EXCISION     x2   TONSILLECTOMY      Assessment / Plan / Recommendation Clinical Impression HPI: Lee Blevins is a 75 year old right-handed male with history of hypertension, 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  Per chart review patient lives with spouse.  1 level home 2 steps to enter.  Independent driving prior to admission manages his own medications and finances.  Wife is limited physically.  Presented 01/11/2021 with blurred vision/right side visual deficit and altered mental status.   CT/MRI showed area of hemorrhage noted to the left occipital lobe. Area of encephalomalacia within the posterior left parietal lobe and area of prior hemorrhage.  Tolerating a regular diet.  SLP evaluation completed revealing mild expressive/receptive language deficits impacting higher level word finding, following sequential commands, and writing deficits. Pt demonstrated intermittent pausing and word searching behavior/circumlocution resulting in reduced verbal fluency at conversation level in which pt demonstrated awareness.  During object naming task, pt exhibited semantic paraphasias during 3 occasions but self corrected each time. Pt displayed difficulty following complex and sequential commands. Pt also exhibited difficulty writing at sentence level and writing personal information. He achieved ~75% spelling accuracy and omitted articles (i.e., the, a). For his address he wrote "[address]" instead of [place]. Suspect processing deficits impacted performance on cognitive-linguistic assessment as evidenced by score 15/30 on the [place] (SLUMS) suggestive of mild-to-moderate deficits with difficulty with orientation to year (pt stated "2021"), short-term memory, calculations, mental manipulation, alternating attention, and planning/organization with clock drawing. Motor speech production was clear without indication or concerns for dysarthria and pt perceived as 100% intelligible. Pt currently consuming regular diet and thin liquids and continues to deny difficulty. Per report, pt was independent with ADLs/iADLs at PLOF. Pt would benefit from skilled ST in CIR to maximize language/cognitive-linguistic function to promote independence and safety prior to discharge.  SLUMS:  Orientation: 2/3 (incorrect year) Word list recall: 1/5 (recalled 3 words w/ semantic cue) Calculations: 1/3 Generative Naming: 2/3 (10 words in 1 min) Mental manipulation/digit reversal: 1/2 Clock Drawing/Executive Function: 0/4 (incorrect w/ 2 attempts) Visuospatial: 1/2 (incorrectly identified triangle) Paragraph Recall: 8/8  Western Aphasia Battery Bedside (WAB-Bedside):  Spontaneous Speech Content: 8/10 Spontaneous Speech Fluency: 9/10 Auditory Verbal Comprehension Yes/No Questions: 10/10 Following sequential commands: 4/10 Verbal repetition: 9/10  Object naming: 9/10 Reading: needs further testing Writing: 5/10   Skilled Therapeutic Interventions          Pt participated in [place] Status Examination (SLUMS), Western Aphasia Battery-Bedside (WAB-B) as well as further non-standardized assessments of cognitive-linguistic, speech, and language function. Please see  above.     SLP Assessment  Patient will need skilled Speech Lanaguage Pathology Services during CIR admission    Recommendations  SLP Diet Recommendations: Age appropriate regular solids;Thin Liquid Administration via: Cup;Straw Medication Administration: Whole meds with liquid Supervision: Patient able to self feed Patient destination: Home Follow up Recommendations: Outpatient SLP Equipment Recommended: None recommended by SLP    SLP Frequency 3 to 5 out of 7 days   SLP Duration  SLP Intensity  SLP Treatment/Interventions 10/11  Minumum of 1-2 x/day, 30 to 90 minutes  Cognitive remediation/compensation;Internal/external aids;Speech/Language facilitation;Cueing hierarchy;Functional tasks;Patient/family education    Pain Pain Assessment Pain Scale: 0-10 Pain Score: 0-No pain  Prior Functioning Cognitive/Linguistic Baseline: Within functional limits Type of Home: House  Lives With: Spouse Available Help at Discharge: Family Vocation: Retired  SLP Evaluation Cognition Overall Cognitive Status: Impaired/Different from baseline Arousal/Alertness: Awake/alert Orientation Level: Oriented to place;Oriented to situation;Oriented to person Year: 2021 Month: October Day of Week: Correct Attention: Alternating Sustained Attention: Appears intact Alternating Attention: Impaired Alternating Attention Impairment: Verbal basic;Functional basic Memory: Impaired Memory Impairment: Storage deficit Awareness: Appears intact Awareness Impairment: Anticipatory impairment Problem Solving: Impaired Problem Solving Impairment: Verbal complex Executive Function: Organizing Organizing: Impaired Organizing Impairment: Verbal complex Behaviors: Restless Safety/Judgment: Appears intact   Comprehension Auditory Comprehension Overall Auditory Comprehension: Impaired Yes/No Questions: Within Functional Limits Complex Commands: 0-24% accurate Conversation: Simple EffectiveTechniques: Visual/Gestural cues;Stressing words Reading Comprehension Reading Status: Not tested Expression Expression Primary Mode of Expression: Verbal Verbal Expression Overall Verbal Expression: Impaired Level of Generative/Spontaneous Verbalization: Conversation Repetition: Impaired Level of Impairment: Sentence level Naming: Impairment Responsive: 76-100% accurate Confrontation: Impaired Convergent: 50-74% accurate Verbal Errors: Semantic paraphasias;Aware of errors Effective Techniques: Phonemic cues Non-Verbal Means of Communication: Not applicable Written Expression Dominant Hand: Right Written Expression: Exceptions to [facility] Self Formulation Ability: Word;Letter Oral Motor    Care Tool Care Tool Cognition Ability to hear (with hearing aid or hearing appliances if normally used Ability to hear (with hearing aid or hearing appliances if normally used): 0. Adequate - no difficulty in normal conservation, social interaction, listening to TV   Expression of Ideas and Wants Expression of Ideas and Wants: 3. Some difficulty - exhibits some difficulty with expressing needs and ideas (e.g, some words or finishing thoughts) or speech is not clear   Understanding Verbal and Non-Verbal Content Understanding Verbal and Non-Verbal Content: 3. Usually understands - understands most conversations, but misses some part/intent of message. Requires cues at times to understand  Memory/Recall Ability Memory/Recall Ability : That he or she is in a hospital/hospital unit;Current season   Short Term Goals: Week 1: SLP Short Term Goal 1 (Week 1): STG=LTG due to ELOS  Refer to Care Plan for Long Term Goals  Recommendations for other services: None   Discharge Criteria: Patient will be discharged from SLP  if patient refuses treatment 3 consecutive times without medical reason, if treatment goals not met, if there is a change in medical status, if patient makes no progress towards goals or if patient is discharged from hospital.  The above assessment, treatment plan, treatment alternatives and goals were discussed and mutually agreed upon: by patient  Patty Sermons 01/18/2021, 4:47 PM

## 2021-01-18 NOTE — Evaluation (Signed)
Occupational Therapy Assessment and Plan  Patient Details  Name: Lee Blevins MRN: [id number] Date of Birth: April 04, 1946  OT Diagnosis: altered mental status, cognitive deficits, disturbance of vision, and muscle weakness (generalized) Rehab Potential: Rehab Potential (ACUTE ONLY): Excellent ELOS: 5-7 days   Today's Date: 01/18/2021 OT Individual Time: 3382-5053 OT Individual Time Calculation (min): 58 min     Hospital Problem: Principal Problem:   Intraparenchymal hemorrhage of brain ([place])   Past Medical History:  Past Medical History:  Diagnosis Date   Asthma    Chronic sinusitis    History of CVA (cerebrovascular accident) 2011   hemorrhagic   HTN (hypertension)    Hyperlipidemia    Nasal polyps    Pneumonia    Past Surgical History:  Past Surgical History:  Procedure Laterality Date   NASAL POLYP EXCISION     x2   TONSILLECTOMY      Assessment & Plan Clinical Impression: Patient is a 75 y.o. year old male with recent admission to the hospital on   01/11/2021 with blurred vision/right side visual deficit and altered mental status.  On arrival to the ED systolic blood pressure in the 150s.  CT/MRI showed area of hemorrhage noted to the left occipital lobe measuring 3 x 7 x 2 0.8 x 2.6 cm, volume of 13.5 mL.  No mass-effect or midline shift.  Area of encephalomalacia within the posterior left parietal lobe and area of prior hemorrhage.  CT angiogram head and neck no significant carotid or vertebral artery stenosis.  Follow-up imaging remained unchanged in size of left occipital intraparenchymal hematoma.  Patient transferred to CIR on 01/17/2021 .    Patient currently requires min with basic self-care skills secondary to muscle weakness and muscle paralysis, field cut and hemianopsia, decreased problem solving and decreased memory, and decreased standing balance, decreased postural control, and decreased balance strategies.  Prior to hospitalization, patient could complete ADLs  with independent .  Patient will benefit from skilled intervention to decrease level of assist with basic self-care skills and increase independence with basic self-care skills prior to discharge home with care partner.  Anticipate patient will require intermittent supervision and follow up outpatient.  OT - End of Session Activity Tolerance: Decreased this session Endurance Deficit: Yes OT Assessment Rehab Potential (ACUTE ONLY): Excellent OT Patient demonstrates impairments in the following area(s): Balance;Cognition;Endurance;Safety;Vision OT Basic ADL's Functional Problem(s): Grooming;Bathing;Dressing;Toileting OT Transfers Functional Problem(s): Toilet;Tub/Shower OT Additional Impairment(s): None OT Plan OT Intensity: Minimum of 1-2 x/day, 45 to 90 minutes OT Frequency: 5 out of 7 days OT Duration/Estimated Length of Stay: 5-7 days OT Treatment/Interventions: Balance/vestibular training;Discharge planning;Pain management;Self Care/advanced ADL retraining;Therapeutic Activities;UE/LE Coordination activities;Cognitive remediation/compensation;Disease mangement/prevention;Functional mobility training;Patient/family education;Therapeutic Exercise;Visual/perceptual remediation/compensation;Community reintegration;DME/adaptive equipment instruction;Neuromuscular re-education;Psychosocial support;UE/LE Strength taining/ROM OT Self Feeding Anticipated Outcome(s): independent OT Basic Self-Care Anticipated Outcome(s): modified independent OT Toileting Anticipated Outcome(s): independent OT Bathroom Transfers Anticipated Outcome(s): modified independent OT Recommendation Patient destination: Home Follow Up Recommendations: Outpatient OT Equipment Recommended: None recommended by OT   OT Evaluation Precautions/Restrictions  Precautions Precautions: Fall Precaution Comments: right visual field deficit Restrictions Weight Bearing Restrictions: No  Pain  No report of pain Home  Living/Prior Functioning Home Living Living Arrangements: Spouse/significant other Available Help at Discharge: Family Type of Home: House Home Access: Stairs to enter [employer] of Steps: 2-3 Entrance Stairs-Rails: Can reach both, Right, Left Home Layout: One level, Other (Comment) Bathroom Shower/Tub: Multimedia programmer: Standard Bathroom Accessibility: Yes  Lives With: Spouse IADL History Homemaking Responsibilities: No Current License:  Yes Occupation: Retired Prior Function Level of Independence: Independent with gait, Independent with transfers, Independent with homemaking with ambulation  Able to Take Stairs?: Yes Driving: Yes Vocation: Retired Biomedical scientist: Radio producer Baseline Vision/History: 1 Wears glasses Ability to See in Adequate Light: 0 Adequate Patient Visual Report: Peripheral vision impairment Vision Assessment?: Yes Eye Alignment: Within Functional Limits Ocular Range of Motion: Within Functional Limits Alignment/Gaze Preference: Within Defined Limits Tracking/Visual Pursuits: Able to track stimulus in all quads without difficulty Convergence: Within functional limits Visual Fields: Right visual field deficit Perception  Perception: Within Functional Limits Praxis Praxis: Intact Cognition Overall Cognitive Status: Impaired/Different from baseline Arousal/Alertness: Awake/alert Orientation Level: Person;Place;Situation Person: Oriented Place: Oriented Situation: Oriented Year: 2022 Month: October Day of Week: Correct Memory: Impaired Memory Impairment: Storage deficit Immediate Memory Recall: Sock;Blue;Bed Memory Recall Sock: With Cue Memory Recall Blue: Without Cue Memory Recall Bed: With Cue Attention: Selective Sustained Attention: Appears intact Alternating Attention Impairment: Verbal basic;Functional basic Awareness: Appears intact Awareness Impairment: Anticipatory impairment Problem Solving:  Impaired Problem Solving Impairment: Verbal complex Executive Function: Writer: Impaired Organizing Impairment: Verbal complex Behaviors: Restless Safety/Judgment: Appears intact Sensation Sensation Light Touch: Appears Intact Hot/Cold: Appears Intact Proprioception: Appears Intact Stereognosis: Appears Intact Coordination Gross Motor Movements are Fluid and Coordinated: Yes Fine Motor Movements are Fluid and Coordinated: Yes Finger Nose Finger Test: [facility] Motor  Motor Motor: Abnormal postural alignment and control Motor - Skilled Clinical Observations: generalized weakness overall  Trunk/Postural Assessment  Cervical Assessment Cervical Assessment: Exceptions to [facility] (cervical protraction) Thoracic Assessment Thoracic Assessment: Exceptions to WFL (thoraci rounding) Lumbar Assessment Lumbar Assessment: Exceptions to [facility] (lumbar flexion in standing)  Balance Balance Balance Assessed: Yes Static Sitting Balance Static Sitting - Balance Support: Feet supported Static Sitting - Level of Assistance: 7: Independent Dynamic Sitting Balance Dynamic Sitting - Balance Support: Feet unsupported Dynamic Sitting - Level of Assistance: 5: Stand by assistance Static Standing Balance Static Standing - Balance Support: During functional activity Static Standing - Level of Assistance: 5: Stand by assistance Dynamic Standing Balance Dynamic Standing - Balance Support: During functional activity Dynamic Standing - Level of Assistance: 4: Min assist (contact guard) Extremity/Trunk Assessment RUE Assessment RUE Assessment: Within Functional Limits LUE Assessment LUE Assessment: Within Functional Limits  Care Tool Care Tool Self Care Eating   Eating Assist Level: Set up assist    Oral Care    Oral Care Assist Level: Contact Guard/Toucning assist (standing)    Bathing   Body parts bathed by patient: Right arm;Left arm;Chest;Abdomen;Front perineal area;Buttocks;Right  upper leg;Face;Left lower leg;Right lower leg;Left upper leg     Assist Level: Contact Guard/Touching assist    Upper Body Dressing(including orthotics)   What is the patient wearing?: Pull over shirt   Assist Level: Set up assist    Lower Body Dressing (excluding footwear)   What is the patient wearing?: Underwear/pull up;Pants Assist for lower body dressing: Contact Guard/Touching assist (standing)    Putting on/Taking off footwear   What is the patient wearing?: Shoes;Ted hose Assist for footwear: Moderate Assistance - Patient 50 - 74%       Care Tool Toileting Toileting activity   Assist for toileting: Contact Guard/Touching assist     Care Tool Bed Mobility Roll left and right activity        Sit to lying activity   Sit to lying assist level: Supervision/Verbal cueing    Lying to sitting on side of bed activity         Care  Tool Transfers Sit to stand transfer   Sit to stand assist level: Contact Guard/Touching assist    Chair/bed transfer   Chair/bed transfer assist level: Contact Guard/Touching assist     Toilet transfer         Care Tool Cognition  Expression of Ideas and Wants Expression of Ideas and Wants: 3. Some difficulty - exhibits some difficulty with expressing needs and ideas (e.g, some words or finishing thoughts) or speech is not clear  Understanding Verbal and Non-Verbal Content Understanding Verbal and Non-Verbal Content: 3. Usually understands - understands most conversations, but misses some part/intent of message. Requires cues at times to understand   Memory/Recall Ability Memory/Recall Ability : That he or she is in a hospital/hospital unit;Current season   Refer to Care Plan for [employer] 1 OT Short Term Goal 1 (Week 1): STGs equal to LTGs set at modified independent level  Recommendations for other services: None    Skilled Therapeutic Intervention ADL ADL Eating: Set up Where Assessed-Eating:  Chair Grooming: Contact guard Where Assessed-Grooming: Standing at sink Upper Body Bathing: Setup Where Assessed-Upper Body Bathing: Chair;Shower Lower Body Bathing: Supervision/safety Where Assessed-Lower Body Bathing: Chair;Shower Upper Body Dressing: Setup Where Assessed-Upper Body Dressing: Chair Lower Body Dressing: Contact guard Where Assessed-Lower Body Dressing: Standing at sink Toileting: Contact guard Where Assessed-Toileting: Glass blower/designer Method: Counselling psychologist: Raised Adult nurse: Not assessed Social research officer, government: Curator Method: Heritage manager: Manufacturing systems engineer  Bed Mobility Bed Mobility: Rolling Right;Rolling Left;Sit to Supine;Supine to Sit Rolling Right: Supervision/verbal cueing Rolling Left: Supervision/Verbal cueing Supine to Sit: Supervision/Verbal cueing Sit to Supine: Supervision/Verbal cueing Transfers Sit to Stand: Contact Guard/Touching assist Stand to Sit: Contact Guard/Touching assist  Session Note:  Pt able to complete showering with min guard assist sit to stand with use of the grab bars for support.  He was able to complete dressing in standing with UE support in the bathroom as well as at the foot of the bed.  He was able to stand at the sink for oral hygiene and combing his hair with min guard.  Mod instructional cueing throughout session for locating items on the right of the shower as well as some objects at the sink.  Pt is aware of his visual field deficit however.  Finished session with transfer to the recliner with call button and phone in reach.  Safety alarm in place with call button in reach.    Discharge Criteria: Patient will be discharged from OT if patient refuses treatment 3 consecutive times without medical reason, if treatment goals not met, if there is a change in medical status, if patient makes no progress towards  goals or if patient is discharged from hospital.  The above assessment, treatment plan, treatment alternatives and goals were discussed and mutually agreed upon: by patient  Lorene Samaan OTR/L 01/18/2021, 9:18 PM

## 2021-01-18 NOTE — Plan of Care (Signed)
  Problem: RH Comprehension Communication Goal: LTG Patient will comprehend basic/complex auditory (SLP) Description: LTG: Patient will comprehend basic/complex auditory information with cues (SLP). Flowsheets (Taken 01/18/2021 1643) LTG: Patient will comprehend:  Complex auditory information  Basic auditory information LTG: Patient will comprehend auditory information with cueing (SLP): Supervision   Problem: RH Expression Communication Goal: LTG Patient will increase word finding of common (SLP) Description: LTG:  Patient will increase word finding of common objects/daily info/abstract thoughts with cues using compensatory strategies (SLP). Flowsheets (Taken 01/18/2021 1643) LTG: Patient will increase word finding of common (SLP): Supervision Patient will use compensatory strategies to increase word finding of:  Common objects  Daily info   Problem: RH Problem Solving Goal: LTG Patient will demonstrate problem solving for (SLP) Description: LTG:  Patient will demonstrate problem solving for basic/complex daily situations with cues  (SLP) Flowsheets (Taken 01/18/2021 1643) LTG: Patient will demonstrate problem solving for (SLP):  Complex daily situations  Basic daily situations LTG Patient will demonstrate problem solving for:  Supervision  Minimal Assistance - Patient > 75%   Problem: RH Memory Goal: LTG Patient will use memory compensatory aids to (SLP) Description: LTG:  Patient will use memory compensatory aids to recall biographical/new, daily complex information with cues (SLP) Flowsheets (Taken 01/18/2021 1643) LTG: Patient will use memory compensatory aids to (SLP): Supervision

## 2021-01-18 NOTE — Discharge Instructions (Addendum)
Inpatient Rehab Discharge Instructions  Lee Blevins Discharge date and time: No discharge date for patient encounter.   Activities/Precautions/ Functional Status: Activity: activity as tolerated Diet: regular diet Wound Care: Routine skin checks Functional status:  ___ No restrictions     ___ Walk up steps independently ___ 24/7 supervision/assistance   ___ Walk up steps with assistance ___ Intermittent supervision/assistance  ___ Bathe/dress independently ___ Walk with walker     _x__ Bathe/dress with assistance ___ Walk Independently    ___ Shower independently ___ Walk with assistance    ___ Shower with assistance ___ No alcohol     ___ Return to work/school ________  Special Instructions: No driving smoking or alcohol    COMMUNITY REFERRALS UPON DISCHARGE:   Outpatient: PT, OT, SP             Agency:[facility]  [address] [place] [address]              PHONE: [phone]             Appointment Date/Time:WILL CAL WIFE TO ARRANGE FOLLOW UP APPOINTMENTS  Medical Equipment/Items Ordered: NO NEEDS                                                 Agency/Supplier: NA  My questions have been answered and I understand these instructions. I will adhere to these goals and the provided educational materials after my discharge from the hospital.  Patient/Caregiver Signature _______________________________ Date __________  Clinician Signature _______________________________________ Date __________  Please bring this form and your medication list with you to all your follow-up doctor's appointments.

## 2021-01-18 NOTE — Patient Care Conference (Signed)
Inpatient RehabilitationTeam Conference and Plan of Care Update Date: 01/18/2021   Time: 13:11 PM    Patient Name: Lee Blevins      Medical Record Number: [id number]  Date of Birth: 1945/05/02 Sex: Male         Room/Bed: 4W20C/4W20C-01 Payor Info: Payor: MEDICARE / Plan: MEDICARE PART A AND B / Product Type: *No Product type* /    Admit Date/Time:  01/17/2021  3:46 PM  Primary Diagnosis:  Intraparenchymal hemorrhage of brain [facility])  Hospital Problems: Principal Problem:   Intraparenchymal hemorrhage of brain [facility])    Expected Discharge Date: Expected Discharge Date: 01/22/21  Team Members Present: Physician leading conference: Dr. Leeroy Cha Social Worker Present: Ovidio Kin, LCSW Nurse Present: Dorien Chihuahua, RN PT Present: Ginnie Smart, PT OT Present: Willeen Cass, OT SLP Present: Charolett Bumpers, SLP     Current Status/Progress Goal Weekly Team Focus  Bowel/Bladder             Swallow/Nutrition/ Hydration   evals pending         ADL's   Pt currently min guard for all bathing and dressing and functional transfers,  Right visual field deficits  modified independent to independent  selfcare retraining, transfer training, balance retraining, visual compensation techniques,  DME education   Mobility   CGA to minA overall. Ambulating >281ft with no AD and minA. R visual field cult and impaired balance are primary deficits  mod I / supervision  Higher level balance and gait training, safety awareness with functional mobility related to R visual field deficit, DC planning   Communication   SLP eval pending         Safety/Cognition/ Behavioral Observations  SLP eval pending         Pain             Skin               Discharge Planning:  new eval home with wife who can provide supervision but not physically assist   Team Discussion: Patient admitted post [place]. MD discontinued Zetia for cholesterol due to bleeding risks and is weaning oxy for pain control.  Labs currently stable and VSS except for low protein level. Dietary modification information/supplements for protein given.  Patient on target to meet rehab goals: yes, currently CGA  ambulating up to 200' without an assistive device.   *See Care Plan and progress notes for long and short-term goals.   Revisions to Treatment Plan:  Working on visual deficits, right field cut and high level balance issues   Teaching Needs: Safety, transfers, toileting, medications, secondary risk management, etc.  Current Barriers to Discharge: Decreased caregiver support and Home enviroment access/layout  Possible Resolutions to Barriers: Family education     Medical Summary               I attest that I was present, lead the team conference, and concur with the assessment and plan of the team.   Dorien Chihuahua B 01/18/2021, 3:50 PM

## 2021-01-19 DIAGNOSIS — I619 Nontraumatic intracerebral hemorrhage, unspecified: Secondary | ICD-10-CM | POA: Diagnosis not present

## 2021-01-19 MED ORDER — PANTOPRAZOLE SODIUM 40 MG PO TBEC: 40.0000 mg | DELAYED_RELEASE_TABLET | Freq: Two times a day (BID) | ORAL | Status: DC | PRN

## 2021-01-19 MED ORDER — OXYCODONE HCL 5 MG PO TABS: 2.5000 mg | ORAL_TABLET | Freq: Three times a day (TID) | ORAL | Status: DC | PRN

## 2021-01-19 NOTE — Progress Notes (Addendum)
Patient ID: Lee Blevins, male   DOB: 11-23-1945, 75 y.o.   MRN: [id number] Therapy team felt could work on more with pt's deficits and changed discharge date to 10/11. Wife was made aware yesterday of change in discharge date.  11:25 AM Spoke with wife after MD to schedule family education tomorrow from 1-4 pm. Discussed OP therapies and she is agreeable to this. See tomorrow when here.

## 2021-01-19 NOTE — Progress Notes (Signed)
Patient ID: Lee Blevins, male   DOB: 1945/07/01, 75 y.o.   MRN: [id number] Met with the patient to introduce self and the nurse CM role. Reviewed medications, secondary risk/treatments including HTN, HLD (LDL 130) total 250 on Zetia  however MD noted MD discontinued due to [place] and previous  ICH. Discussed dietary modifications and recommendation for increased protein rich foods. Patient noted he had reviewed the handouts with his wife and had no questions at present. Continue to follow along to discharge and address educational needs. Patient noted he and wife are comfortable with established discharge date. Margarito Liner

## 2021-01-19 NOTE — Progress Notes (Signed)
PROGRESS NOTE   Subjective/Complaints: No complaints this morning Would like to go home earlier on Monday- discussed that this would be fine with me. Sent a message to the team to see if her can get therapy on Sunday  ROS: denies pain   Objective:   No results found. Recent Labs    01/17/21 0121 01/18/21 0425  WBC 6.0 4.9  HGB 14.4 14.4  HCT 42.0 41.9  PLT 206 209   Recent Labs    01/17/21 0121 01/18/21 0425  NA 136 136  K 4.0 3.9  CL 104 102  CO2 25 26  GLUCOSE 104* 96  BUN 24* 20  CREATININE 0.92 0.97  CALCIUM 9.3 9.3    Intake/Output Summary (Last 24 hours) at 01/19/2021 0948 Last data filed at 01/19/2021 0759 Gross per 24 hour  Intake 720 ml  Output --  Net 720 ml        Physical Exam: Vital Signs Blood pressure 139/90, pulse 81, temperature 97.9 F (36.6 C), temperature source Oral, resp. rate 16, height 6' (1.829 m), SpO2 97 %. Gen: no distress, normal appearing HEENT: oral mucosa pink and moist, NCAT Cardio: Reg rate Chest: normal effort, normal rate of breathing Abd: soft, non-distended Ext: no edema Psych: pleasant, normal affect Skin: intact Neurological:     Comments: Patient is alert.  Right homonymous hemianopsia.  Makes eye contact with examiner he does have some word finding difficulties and paraphasic errors.  He has some difficulty naming repetition.  Follows simple demonstrated commands. 5/5 strength throughout, sensation intact throughout. No dysmetria   Assessment/Plan: 1. Functional deficits which require 3+ hours per day of interdisciplinary therapy in a comprehensive inpatient rehab setting. Physiatrist is providing close team supervision and 24 hour management of active medical problems listed below. Physiatrist and rehab team continue to assess barriers to discharge/monitor patient progress toward functional and medical goals  Care Tool:  Bathing    Body parts bathed  by patient: Right arm, Left arm, Chest, Abdomen, Front perineal area, Buttocks, Right upper leg, Face, Left lower leg, Right lower leg, Left upper leg         Bathing assist Assist Level: Contact Guard/Touching assist     Upper Body Dressing/Undressing Upper body dressing   What is the patient wearing?: Pull over shirt    Upper body assist Assist Level: Set up assist    Lower Body Dressing/Undressing Lower body dressing      What is the patient wearing?: Underwear/pull up, Pants     Lower body assist Assist for lower body dressing: Contact Guard/Touching assist (standing)     Toileting Toileting    Toileting assist Assist for toileting: Contact Guard/Touching assist     Transfers Chair/bed transfer  Transfers assist     Chair/bed transfer assist level: Contact Guard/Touching assist     Locomotion Ambulation   Ambulation assist      Assist level: Contact Guard/Touching assist Assistive device: No Device Max distance: 15'   Walk 10 feet activity   Assist     Assist level: Minimal Assistance - Patient > 75% Assistive device: No Device   Walk 50 feet activity   Assist  Assist level: Minimal Assistance - Patient > 75% Assistive device: No Device    Walk 150 feet activity   Assist    Assist level: Minimal Assistance - Patient > 75% Assistive device: No Device    Walk 10 feet on uneven surface  activity   Assist     Assist level: Minimal Assistance - Patient > 75%     Wheelchair     Assist Is the patient using a wheelchair?: No             Wheelchair 50 feet with 2 turns activity    Assist            Wheelchair 150 feet activity     Assist          Blood pressure 139/90, pulse 81, temperature 97.9 F (36.6 C), temperature source Oral, resp. rate 16, height 6' (1.829 m), SpO2 97 %.    Medical Problem List and Plan: 1.  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  Small area of acute ischemia in the left centrum semiovale likely from small vessel disease as well as history of left parietal parenchymal ICH 2011             -patient may shower             -ELOS/Goals: 5 days modI  -Continue CIR 2.  Antithrombotics: -DVT/anticoagulation:  SCD             -antiplatelet therapy: N/A 3. Pain: Decrease Oxycodone to 2.5mg  q18H prn/tramadol as needed 4. Mood: Provide emotional support             -antipsychotic agents: N/A 5. Neuropsych: This patient is not capable of making decisions on his own behalf. 6. Skin/Wound Care: Routine skin checks 7. Fluids/Electrolytes/Nutrition: Routine in and outs with follow-up chemistries 8.  Hypertension.  Very well controlled, continue Avapro 150 mg daily.  Monitor with increased mobility 9.  Asthma.  Chronic prednisone 5 mg daily.  Continue nebulizers. 10.  Hyperlipidemia.  LDL 130, TG 42, HDL 112, VLDL 8, total cholesterol 250, Total cholesterol/HDL ratio is 2.2. Discuss risks and benefits of continuing Zetia and decided to discontinue medicine given association between statins and ICH 11.  History of alcohol use.  Alcohol screen negative.  Provide counseling 12. Low protein levels: encouraged choosing high protein foods.  13. Aphasia: continue SLP 14. Disposition: scheduled hospital f/u  LOS: 2 days A FACE TO FACE EVALUATION WAS PERFORMED  Lee Blevins Lee Blevins 01/19/2021, 9:48 AM

## 2021-01-19 NOTE — Progress Notes (Signed)
Occupational Therapy Session Note  Patient Details  Name: Lee Blevins MRN: [id number] Date of Birth: 20-May-1945  Today's Date: 01/19/2021 OT Individual Time: 6720-9470 OT Individual Time Calculation (min): 53 min    Short Term Goals: Week 1:  OT Short Term Goal 1 (Week 1): STGs equal to LTGs set at modified independent level  Skilled Therapeutic Interventions/Progress Updates:    Pt received seated in recliner, denies pain, agreeable to therapy. Session focus on self-care retraining, activity tolerance, functional cognition, dual tasking, dynamic standing balance in prep for improved ADL/IADL/func mobility performance + decreased caregiver burden. Sit to stand and ambulated throughout session with no AD and S level. Amb down to hospital atrium, given "shopping list" of 5 items x2 verbally. Required assist to recall 3/5 items at later time. Able to locate 2 items with ind, required max A to locate last three items and cues for R visual scanning/organization of shopping trip. Weaved in and out of tables/chairs to target dynamic standing balance, activity tolerance, and dual tasking in busy community environment. Able to amb over uneven outdoor surfaces, as well with no LOB and min Vcs for obstacle navigation/path finding. Required max A for pathfinding way back to rehab unit. Unable to recall room number.   In kitchen, prepared oatmeal on stove-top with mod Vcs for locating and identifying necessary items (bowl, spoon, etc.) Did turn on/off stove appropriately. Able to identify 4/6 safety hazards/falls risks (items on floor, running water, etc.) in kitchen with min visual cues.    Amb transfer back to recliner same manner as before.  Pt left seated in recliner with safety belt alarm engaged, call bell in reach, and all immediate needs met.    Therapy Documentation Precautions:  Precautions Precautions: Fall Precaution Comments: right visual field deficit Restrictions Weight Bearing  Restrictions: No  Pain:  denies ADL: See Care Tool for more details. Therapy/Group: Individual Therapy  Volanda Napoleon MS, OTR/L  01/19/2021, 6:42 AM

## 2021-01-19 NOTE — Progress Notes (Signed)
Speech Language Pathology Daily Session Note  Patient Details  Name: Falcon Mccaskey MRN: [id number] Date of Birth: 12/26/1945  Today's Date: 01/19/2021 SLP Individual Time: 0907-1005 SLP Individual Time Calculation (min): 58 min  Short Term Goals: Week 1: SLP Short Term Goal 1 (Week 1): STG=LTG due to ELOS  Skilled Therapeutic Interventions:Skilled ST services focused on cognitive skills. SLP facilitated mildly complex problem solving, error awareness and recall skills in account balancing task. Pt required max A verbal cues for organization within task, however when the task was broken down step by step by the end of the task pt required mod A verbal cues for error awareness and min A verbal cues for problem solving. Pt demonstrated mostly appropriate calculations both mentally and on the calculator, primary impairment was organization. Pt required mod A verbal cues for recall within the task.  Pt was left in room with call bell within reach and chair alarm set. SLP recommends to continue skilled services.     Pain Pain Assessment Pain Scale: 0-10 Pain Score: 0-No pain  Therapy/Group: Individual Therapy  Sirron Francesconi  [facility] 01/19/2021, 4:16 PM

## 2021-01-19 NOTE — Progress Notes (Signed)
Physical Therapy Session Note  Patient Details  Name: Lee Blevins MRN: [id number] Date of Birth: 1946/03/14  Today's Date: 01/19/2021 PT Individual Time: 0800-0857 + 1245-8099 PT Individual Time Calculation (min): 57 min  + 28 min  Short Term Goals: Week 1:  PT Short Term Goal 1 (Week 1): STG = LTG d/t ELOS  Skilled Therapeutic Interventions/Progress Updates:     1st session: Pt sitting in recliner at start of session and is agreeable to therapy treatment. Denies pain. Applies tennis shoes with setupA while sitting in recliner. Sit<>stand with supervision and no AD and ambulates ~254ft to ortho rehab gym with CGA and no AD. Completes NMR activites with BITS system in standing with supervision assist - completes task that target R visual scanning, complex sequencing, and visual motor duplication tasks ( bell cancellation test, number/letter sequencing, and geoboard duplication). Pt required max verbal cues for number/letter sequencing, mod verbal cues for geoboard duplication, and min cues for bell cancellation. He also struggled with short term memory of instructions for the complex sequencing task and noted some language deficits/aphasia during this task. Next, completed Nustep with setupA for 8 minutes, using x4 extremities, targeting global endurance and strengthening, he reports a 10 /20 on RPE scale at completion, reporting light exertion. Pt ambulated to ADL apartment room with close supervision and no AD. Completed furniture transfers with CGA and no AD and discussed general home safety, DC planning, etc during rest break. Ambulated back to his room with supervision and no AD and completed session in recliner with safety belt alarm on and all his needs within reach. Updated on upcoming therapy schedule.   2nd session: Pt seated in recliner on arrival and is agreeable to PT session. He denies any pain. Sit<>stand with supervision and no AD - ambulates to main rehab gym with supervision - cues  for increased natural arm swing and lighthouse scanning to compensate for R visual field deficit. Focused session on dynamic gait with dual-cog tasking. Worked on straight path gait while tossing and catching unweighted ball with supervision assist. Also worked on dribbling the ball while walking but he struggled with this due to R visual field deficit as he would "lose" the ball when it was placed on R side. Then worked on word finding and aphasia while ambulating - naming words that start with "D", "P', and "F". He was able to correctly identify 10 words per letter within ~2 minutes of time frame. He completed serial casting by 2's from 100 but required min cues for memory and sequencing. He completed basketball toss with supervision with goal aimed on R side to promote R visual scanning. He ambulated back to his room with supervision and no AD and completed session seated in recliner with all needs in reach and safety belt alarm on.   Therapy Documentation Precautions:  Precautions Precautions: Fall Precaution Comments: right visual field deficit Restrictions Weight Bearing Restrictions: No General:    Therapy/Group: Individual Therapy  Maryn Freelove P Maricela Kawahara PT 01/19/2021, 7:59 AM

## 2021-01-20 DIAGNOSIS — I619 Nontraumatic intracerebral hemorrhage, unspecified: Secondary | ICD-10-CM | POA: Diagnosis not present

## 2021-01-20 MED ORDER — OXYCODONE HCL 5 MG PO TABS: 2.5000 mg | ORAL_TABLET | Freq: Two times a day (BID) | ORAL | Status: DC | PRN

## 2021-01-20 NOTE — Progress Notes (Addendum)
Occupational Therapy Session Note  Patient Details  Name: Lee Blevins MRN: [id number] Date of Birth: 06/09/1945  Today's Date: 01/20/2021 OT Individual Time: 0905-1000; and 1400-1450 OT Individual Time Calculation (min): 55 min ; 50 min    Short Term Goals: Week 1:  OT Short Term Goal 1 (Week 1): STGs equal to LTGs set at modified independent level  Skilled Therapeutic Interventions/Progress Updates:    First session: Pt sitting up in recliner, no c/o pain, requesting to shower when given the option by therapist. Discussed presence of right visual field cut and pt able to recall compensatory strategy of turning head and scanning expecially  Pt completed all self care and mobility with distant supervision and without AD including the following:  Pt ambulated from recliner to bathroom approximately 15 feet.  Doffed shirt, pants, boxers, and socks in standing, and bathed UB and LB at sit to stand level using grab bar at times for balance.  Pt dried self off and ambulated to EOB where he donned shirt, boxers, pants in standing.  Pt retrieved ADL items for sinkside grooming and oral care and stood to complete.  Pt sat EOB to donn socks and shoes.   Pt then completed functional ambulation with obstacle maneuvering and turning while scanning to the right.  Pt able to maneuver in busy environment with close supervision and ambulated approximately 200 feet.  Pt unable to recall his own room number.  Increased time needed to use signs in order to find room after therapist provided him with number.  Call bell in reach, seat alarm on.  Second session: Pt sitting up in recliner, wife in room for family education session.  Discussed at length regarding pt's dc planning.  Pt reports he used to spend a lot of time doing yardwork and spending time working on projects using his toolbox/tools.  Pt also helps out some with laundry but does not cook.  He does make coffee in the morning and takes care of dog.  Per wife,  walk in shower has a two inch threshold and a shower chair with a shower curtain.  Pt stepped into and out of simulated shower threshold during session with supervision and without the use of grab bars using anterior stepping approach both ways.  Educated pt and wife regarding fall prevention recommendations during bathing at shower level including use of antislip mat, drying off self and surfaces well prior to exiting shower, and sitting for majority of bathing.  Wife verbalizing good understanding of all recommendations.  Also educated pt and wife regarding compensatory strategy needed of scanning and turning head to right during self care to accomodate right visual field cut.  Call bell in reach, seat alarm on at end of session.  Therapy Documentation Precautions:  Precautions Precautions: Fall Precaution Comments: right visual field deficit Restrictions Weight Bearing Restrictions: No    Therapy/Group: Individual Therapy  Lee Blevins 01/20/2021, 12:45 PM

## 2021-01-20 NOTE — Progress Notes (Signed)
Patient ID: Lee Blevins, male   DOB: 29-Mar-1946, 75 y.o.   MRN: [id number]  Met with pt and wife who is here for education in preparation for discharge Monday. Pt is doing well and wife feels can manage him at home. Agreeable to [facility] and [address] to them. Referral sent to facility will contact wife to arrange appointments. No equipment needs

## 2021-01-20 NOTE — Progress Notes (Signed)
PROGRESS NOTE   Subjective/Complaints: No complaints this morning Discussed progress with patient and his wife and provided education regarding diet and exercise when he returns home  ROS: denies pain   Objective:   No results found. Recent Labs    01/18/21 0425  WBC 4.9  HGB 14.4  HCT 41.9  PLT 209   Recent Labs    01/18/21 0425  NA 136  K 3.9  CL 102  CO2 26  GLUCOSE 96  BUN 20  CREATININE 0.97  CALCIUM 9.3    Intake/Output Summary (Last 24 hours) at 01/20/2021 1357 Last data filed at 01/20/2021 1231 Gross per 24 hour  Intake 360 ml  Output --  Net 360 ml        Physical Exam: Vital Signs Blood pressure 127/66, pulse 77, temperature 97.8 F (36.6 C), temperature source Oral, resp. rate 16, height 6' (1.829 m), weight 75.6 kg, SpO2 98 %. Gen: no distress, normal appearing HEENT: oral mucosa pink and moist, NCAT Cardio: Reg rate Chest: normal effort, normal rate of breathing Abd: soft, non-distended Ext: no edema Psych: pleasant, normal affect Skin: intact Neurological:     Comments: Patient is alert.  Right homonymous hemianopsia.  Makes eye contact with examiner he does have some word finding difficulties and paraphasic errors.  He has some difficulty naming repetition.  Follows simple demonstrated commands. 5/5 strength throughout, sensation intact throughout. No dysmetria   Assessment/Plan: 1. Functional deficits which require 3+ hours per day of interdisciplinary therapy in a comprehensive inpatient rehab setting. Physiatrist is providing close team supervision and 24 hour management of active medical problems listed below. Physiatrist and rehab team continue to assess barriers to discharge/monitor patient progress toward functional and medical goals  Care Tool:  Bathing    Body parts bathed by patient: Right arm, Left arm, Chest, Abdomen, Front perineal area, Buttocks, Right upper leg,  Face, Left lower leg, Right lower leg, Left upper leg         Bathing assist Assist Level: Contact Guard/Touching assist     Upper Body Dressing/Undressing Upper body dressing   What is the patient wearing?: Pull over shirt    Upper body assist Assist Level: Set up assist    Lower Body Dressing/Undressing Lower body dressing      What is the patient wearing?: Underwear/pull up, Pants     Lower body assist Assist for lower body dressing: Contact Guard/Touching assist (standing)     Toileting Toileting    Toileting assist Assist for toileting: Contact Guard/Touching assist     Transfers Chair/bed transfer  Transfers assist     Chair/bed transfer assist level: Contact Guard/Touching assist     Locomotion Ambulation   Ambulation assist      Assist level: Contact Guard/Touching assist Assistive device: No Device Max distance: 15'   Walk 10 feet activity   Assist     Assist level: Minimal Assistance - Patient > 75% Assistive device: No Device   Walk 50 feet activity   Assist    Assist level: Minimal Assistance - Patient > 75% Assistive device: No Device    Walk 150 feet activity   Assist  Assist level: Minimal Assistance - Patient > 75% Assistive device: No Device    Walk 10 feet on uneven surface  activity   Assist     Assist level: Minimal Assistance - Patient > 75%     Wheelchair     Assist Is the patient using a wheelchair?: No             Wheelchair 50 feet with 2 turns activity    Assist            Wheelchair 150 feet activity     Assist          Blood pressure 127/66, pulse 77, temperature 97.8 F (36.6 C), temperature source Oral, resp. rate 16, height 6' (1.829 m), weight 75.6 kg, SpO2 98 %.    Medical Problem List and Plan: 1.  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  Small area of  acute ischemia in the left centrum semiovale likely from small vessel disease as well as history of left parietal parenchymal ICH 2011             -patient may shower             -ELOS/Goals: 5 days modI  -Continue CIR 2.  Impaired mobility: continue SCD             -antiplatelet therapy: N/A 3. Pain: Decrease Oxycodone to 2.5mg  q12prn/tramadol as needed 4. Mood: Provide emotional support             -antipsychotic agents: N/A 5. Neuropsych: This patient is not capable of making decisions on his own behalf. 6. Skin/Wound Care: Routine skin checks 7. Fluids/Electrolytes/Nutrition: Routine in and outs with follow-up chemistries 8.  Hypertension.  Very well controlled, continue Avapro 150 mg daily.  Monitor with increased mobility 9.  Asthma.  Chronic prednisone 5 mg daily.  Continue nebulizers. 10.  Hyperlipidemia.  LDL 130, TG 42, HDL 112, VLDL 8, total cholesterol 250, Total cholesterol/HDL ratio is 2.2. Discuss risks and benefits of continuing Zetia and decided to discontinue medicine given association between statins and ICH. Provided dietary education 11.  History of alcohol use.  Alcohol screen negative.  Provide counseling 12. Low protein levels: encouraged choosing high protein foods.  13. Aphasia: continue SLP 14. Disposition: scheduled hospital f/u  LOS: 3 days A FACE TO FACE EVALUATION WAS PERFORMED  Martha Clan P Cecile Gillispie 01/20/2021, 1:57 PM

## 2021-01-20 NOTE — Progress Notes (Signed)
Physical Therapy Session Note  Patient Details  Name: Lee Blevins MRN: [id number] Date of Birth: [birth year]  Today's Date: 01/20/2021 PT Individual Time: 9628-3662 PT Individual Time Calculation (min): 55 min   Short Term Goals: Week 1:  PT Short Term Goal 1 (Week 1): STG = LTG d/t ELOS  Skilled Therapeutic Interventions/Progress Updates:     Pt seated in recliner to start session - wife at the bedside for scheduled family education and training. Pt denies pain. Pt competes transfers throughout session at supervision level. He also ambulates long hallway distances with supervision and no AD. Completes car transfers at mod I level with car height simulating their [employer]. He ambulates up/down inclined ramp and around the mulch pit at supervision level - including navigating curbs. He completes stair training - 6inch steps, 12 steps, with 2 hand rails and supervision  with reciprocal stepping. He progresses to 1 hand rail, 12 steps with supervision. And then no hand rail, 12 steps, with supervision. Simulating carrying grocery bags with 10# dumbbell - navigating stairs with 1 hand rail and 10# dumbbell in other hand with CGA. Lengthy discussion throughout session regarding home safety, DC planning, f/u therapies, stroke recovery, R visual field deficits, progressive mobility, etc. All questions and concerns addressed and wife was appreciative of feedback. Pt ambulated back to his room, ~218ft, with supervision and no AD. Remained seated in recliner with safety belt alarm on and all his needs within reach. Wife at bedside for upcoming OT family ed session.   Therapy Documentation Precautions:  Precautions Precautions: Fall Precaution Comments: right visual field deficit Restrictions Weight Bearing Restrictions: No General:     Therapy/Group: Individual Therapy  Misti Towle P Addisson Frate 01/20/2021, 7:29 AM

## 2021-01-20 NOTE — Progress Notes (Signed)
Speech Language Pathology Daily Session Note  Patient Details  Name: Lee Blevins MRN: [id number] Date of Birth: 1946-04-09  Today's Date: 01/20/2021 SLP Individual Time: 9276-3943 SLP Individual Time Calculation (min): 45 min  Short Term Goals: Week 1: SLP Short Term Goal 1 (Week 1): STG=LTG due to ELOS  Skilled Therapeutic Interventions: Pt received in recliner chair and agreeable to skilled ST intervention with focus on family education alongside his spouse. SLP provided education on expressive/receptive/writing deficits secondary to aphasia, effective communication strategies, cognitive-communication deficits w/ strategies, and recommendations for support with iADLs at discharge. All education was accompanied by education handouts which were provided to pt and spouse to keep. All questions were addressed to satisfaction per wife. Pt remains motivated to participate and further improvement. Spouse pleased with pt's progress so far. Patient was left in recliner with alarm activated and immediate needs within reach at end of session. Continue per current plan of care.      Pain Pain Assessment Pain Scale: 0-10 Pain Score: 0-No pain  Therapy/Group: Individual Therapy  Lee Blevins 01/20/2021, 5:32 PM

## 2021-01-21 DIAGNOSIS — I619 Nontraumatic intracerebral hemorrhage, unspecified: Secondary | ICD-10-CM | POA: Diagnosis not present

## 2021-01-21 LAB — GLUCOSE, CAPILLARY: Glucose-Capillary: 98 mg/dL (ref 70–99)

## 2021-01-21 MED ORDER — OXYCODONE HCL 5 MG PO TABS: 2.5000 mg | ORAL_TABLET | Freq: Every day | ORAL | Status: DC | PRN

## 2021-01-21 NOTE — Progress Notes (Signed)
Speech Language Pathology Daily Session Note  Patient Details  Name: Lee Blevins MRN: [id number] Date of Birth: [birth year]  Today's Date: 01/21/2021 SLP Individual Time: 5974-1638 SLP Individual Time Calculation (min): 45 min  Short Term Goals: Week 1: SLP Short Term Goal 1 (Week 1): STG=LTG due to ELOS  Skilled Therapeutic Interventions:   Patient seen for skilled ST session focusing on receptive and expressive language goals. He was sitting in recliner and very receptive to therapy session. SLP administered portions of RCBA-2 (Reading Comprehension Battery for Aphasia). For Synonyms identification he was correct on 8/10. (The two he missed he chose antonyms); for Sentence Comprehension he was correct on 9/10; for Short Paragraph Comprehension he was correct on 1/3 with modA verbal cues from SLP and section was not completed. He was not able to answer questions related to sample medication label and appeared to become disorganized and overwhelmed by all the information. He did best with short, concise sentences. Patient continues to benefit from skilled SLP intervention to maximize language function prior to discharge.  Pain Pain Assessment Pain Scale: 0-10 Pain Score: 0-No pain  Therapy/Group: Individual Therapy   Sonia Baller, MA, CCC-SLP Speech Therapy

## 2021-01-21 NOTE — Progress Notes (Signed)
Physical Therapy Session Note  Patient Details  Name: Lee Blevins MRN: [id number] Date of Birth: October 12, 1945  Today's Date: 01/21/2021 PT Individual Time: 0915-1010 + 1300-1330 PT Individual Time Calculation (min): 55 min + 30 min  Short Term Goals: Week 1:  PT Short Term Goal 1 (Week 1): STG = LTG d/t ELOS  Skilled Therapeutic Interventions/Progress Updates:     1st session: Pt seated in recliner to start session - awake and agreeable to therapy treatment without reports of pain. Requests assistance in getting his pants and boxer briefs on as he's in his pajamas. Completed lower body dressing with setupA, including donning tennis shoes and socks. He first attempted to complete lower body dress in standing - educated on fall prevention strategies and sitting down while dressing to avoid single leg stance and increase his risk of falls - he voiced understanding but would benefit from further practice.   Ambulated with distant supervision and no AD from his room downstairs via elevator, to outside near reflection fountain. While outdoors, worked on Hydrographic surveyor - ambulated along sidewalks and up/down 8 + 4 stairs (1 hand rail) with supervision. He was also able to demonstrate ability to ambulate long distance on unlevel surfaces such as grass and mulch, while appropriately avoiding shrubs/plants, at supervision level. He also practiced crossing streets with cross walk with cues only needed to ensure he's fully turning his head while looking right prior to crossing due to his R visual field deficit. He ambulated several hundred feet while outdoors without signs of LOB or knee buckling with appropriate safety awareness. While outdoors, also discussed biopsychosocial concerns related to his CVA and general stroke recovery, which he was appreciative of. Returned upstairs to main rehab gym via Media planner, ambulating at supervision level. Completed x10 minutes of Nustep at  workload of 7, using x4 extremities, while maintaining a cadence of ~60 steps/minute throughout - worked on Automotive engineer.  Pt ambulated back to his room, ~219ft, with supervision and no AD - completed session seated in recliner with chair alarm on and all his needs within reach.   2nd session: Pt in recliner to start session - agreeable to therapy with no reports of pain. Sit<>stand with distant supervision and no AD from recliner - ambulates with supervision and no AD to main rehab gym without LOB. In rehab gym, completed ball toss to rebounder with CGA for balance, standing on level ground - pt with difficulty of accuracy on the trampoline rebounder and would also sometimes reports difficulty seeing ball on the R side due to R visual field cut. Next, completed standing ball toss with distant supervision, ball toss in all directions but bias towards R side to promote visual scanning - no LOB and was able to pick up the ball from ground surface without difficulty. Next, completed alternating step ups in forward and lateral direction onto 4inch platform with 3# dowel rod - minA guard for safety, 1x15 reps each direction. Ambulated back to his room with supervision and no AD, able to locate room without cues. After speaking with therapy team - updated pt to mod I level for in room only - provided sign above bed and messaged RN to update therapy if safety concerns exist - pt informed and pleased. All needs in reach at end of session.  Therapy Documentation Precautions:  Precautions Precautions: Fall Precaution Comments: right visual field deficit Restrictions Weight Bearing Restrictions: No General:    Therapy/Group: Individual Therapy  Meggen Spaziani P Miesha Bachmann 01/21/2021, 7:39 AM

## 2021-01-21 NOTE — Progress Notes (Signed)
Occupational Therapy Session Note  Patient Details  Name: Lee Blevins MRN: [id number] Date of Birth: 1946-03-06  Today's Date: 01/21/2021 OT Individual Time: 9390-3009 OT Individual Time Calculation (min): 53 min    Short Term Goals: Week 1:  OT Short Term Goal 1 (Week 1): STGs equal to LTGs set at modified independent level  Skilled Therapeutic Interventions/Progress Updates:  Pt greeted seated in recliner agreeable to OT intervention. Session focus on visual scanning therapeutic activities and IADL tasks in [place] focused on problem solving, dynamic balance, safety and compensatory strategies for visual deficits. Pt completed functional mobilty from recliner down to therapy gym with no AD and supervision assist. Pt completed standing visual scanning task using BITS. Pt instructed to scan screen to sequence letters. Pt completed first trial with letters positioned randomly across board, pt completed task with 89.29% accuracy in 4 mins and 49 secs with a 11.58 sec reaction time. Next trial letters positioned peripherally as pt presents with what appears to be a R peripheral visual field cut. Pt completed this trial with 89.29%accuracy in 2 min and 57 secs with a 7.09 sec reaction time. Pt required 2 cues during both trial to sequence correct letter, but noted to use visual scanning skills effectivley during activity.  Pt completed functional mobility to [place] with no AD and supervision. Pt completed problem solving IADL task where pt given written instructions to complete in kitchen including: Measure 600 mL of water Set the table for dinner Prepare one glass of water and take to table.  Pt required intermittent cues to problem solve task as pt needing assist interpreting instructions unsure if impaired problem solving d/t visual deficits when reading hand written  instructions or baseline impaired problem solving skills.pt completed task with overall supervision with able to transport items  and reach out of bos to retrieve needed items out of high and low cabinets. Pt with good safety awareness during task, pt did require increased time to locate needed kitchen items in cabinets/ drawers d/t novel environment and visual deficits. Pt completed functional ambulation back to room wit supervision with pt able to locate room with no cues.  pt left seated in recliner with alarm belt activated and all needs within reach.                       Therapy Documentation Precautions:  Precautions Precautions: Fall Precaution Comments: right visual field deficit Restrictions Weight Bearing Restrictions: No  Pain: pt reports no pain during session     Therapy/Group: Individual Therapy  Corinne Ports [facility] 01/21/2021, 8:55 AM

## 2021-01-21 NOTE — Progress Notes (Signed)
PROGRESS NOTE   Subjective/Complaints: No complaints this morning  ROS: denies pain, constipation   Objective:   No results found. No results for input(s): WBC, HGB, HCT, PLT in the last 72 hours.  No results for input(s): NA, K, CL, CO2, GLUCOSE, BUN, CREATININE, CALCIUM in the last 72 hours.   Intake/Output Summary (Last 24 hours) at 01/21/2021 1204 Last data filed at 01/21/2021 0700 Gross per 24 hour  Intake 360 ml  Output --  Net 360 ml        Physical Exam: Vital Signs Blood pressure (!) 145/83, pulse 74, temperature 97.8 F (36.6 C), temperature source Oral, resp. rate 18, height 6' (1.829 m), weight 75.6 kg, SpO2 96 %. Gen: no distress, normal appearing HEENT: oral mucosa pink and moist, NCAT Cardio: Reg rate Chest: normal effort, normal rate of breathing Abd: soft, non-distended Ext: no edema Psych: pleasant, normal affect Skin: intact Neurological:     Comments: Patient is alert.  Right homonymous hemianopsia.  Makes eye contact with examiner he does have some word finding difficulties and paraphasic errors.  He has some difficulty naming repetition.  Follows simple demonstrated commands. 5/5 strength throughout, sensation intact throughout. No dysmetria   Assessment/Plan: 1. Functional deficits which require 3+ hours per day of interdisciplinary therapy in a comprehensive inpatient rehab setting. Physiatrist is providing close team supervision and 24 hour management of active medical problems listed below. Physiatrist and rehab team continue to assess barriers to discharge/monitor patient progress toward functional and medical goals  Care Tool:  Bathing    Body parts bathed by patient: Right arm, Left arm, Chest, Abdomen, Front perineal area, Buttocks, Right upper leg, Face, Left lower leg, Right lower leg, Left upper leg         Bathing assist Assist Level: Contact Guard/Touching assist      Upper Body Dressing/Undressing Upper body dressing   What is the patient wearing?: Pull over shirt    Upper body assist Assist Level: Set up assist    Lower Body Dressing/Undressing Lower body dressing      What is the patient wearing?: Underwear/pull up, Pants     Lower body assist Assist for lower body dressing: Contact Guard/Touching assist (standing)     Toileting Toileting    Toileting assist Assist for toileting: Contact Guard/Touching assist     Transfers Chair/bed transfer  Transfers assist     Chair/bed transfer assist level: Supervision/Verbal cueing     Locomotion Ambulation   Ambulation assist      Assist level: Supervision/Verbal cueing Assistive device: No Device Max distance: 500'   Walk 10 feet activity   Assist     Assist level: Supervision/Verbal cueing Assistive device: No Device   Walk 50 feet activity   Assist    Assist level: Supervision/Verbal cueing Assistive device: No Device    Walk 150 feet activity   Assist    Assist level: Supervision/Verbal cueing Assistive device: No Device    Walk 10 feet on uneven surface  activity   Assist     Assist level: Supervision/Verbal cueing     Wheelchair     Assist Is the patient using a wheelchair?: No  Wheelchair 50 feet with 2 turns activity    Assist            Wheelchair 150 feet activity     Assist          Blood pressure (!) 145/83, pulse 74, temperature 97.8 F (36.6 C), temperature source Oral, resp. rate 18, height 6' (1.829 m), weight 75.6 kg, SpO2 96 %.    Medical Problem List and Plan: 1.  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  Small area of acute ischemia in the left centrum semiovale likely from small vessel disease as well as history of left parietal parenchymal ICH 2011             -patient may shower              -ELOS/Goals: 5 days modI  -Continue CIR 2.  Impaired mobility: continue SCD             -antiplatelet therapy: N/A 3. Pain: Decrease Oxycodone to 2.5mg  daily prn/tramadol as needed 4. Mood: Provide emotional support             -antipsychotic agents: N/A 5. Neuropsych: This patient is not capable of making decisions on his own behalf. 6. Skin/Wound Care: Routine skin checks 7. Fluids/Electrolytes/Nutrition: Routine in and outs with follow-up chemistries 8.  Hypertension.  Very well controlled, continue Avapro 150 mg daily.  Monitor with increased mobility. Provided education regarding consuming nuts, citrus fruits.  9.  Asthma.  Chronic prednisone 5 mg daily.  Continue nebulizers. 10.  Hyperlipidemia.  LDL 130, TG 42, HDL 112, VLDL 8, total cholesterol 250, Total cholesterol/HDL ratio is 2.2. Discuss risks and benefits of continuing Zetia and decided to discontinue medicine given association between statins and ICH. Provided dietary education 11.  History of alcohol use.  Alcohol screen negative.  Provide counseling 12. Low protein levels: encouraged choosing high protein foods.  13. Aphasia: improving, continue SLP 14. Disposition: scheduled hospital f/u. D/c Monday  LOS: 4 days A FACE TO FACE EVALUATION WAS PERFORMED  Lee Blevins 01/21/2021, 12:04 PM

## 2021-01-21 NOTE — Progress Notes (Signed)
Occupational Therapy Session Note  Patient Details  Name: Lee Blevins MRN: [id number] Date of Birth: [birth year]  Today's Date: 01/21/2021 OT Individual Time: 1445-1515 OT Individual Time Calculation (min): 30 min    Short Term Goals: Week 1:  OT Short Term Goal 1 (Week 1): STGs equal to LTGs set at modified independent level  Skilled Therapeutic Interventions/Progress Updates:    Pt received in recliner with no c/o pain and agreeable to OT session focusing on IADLs, dynamic standing balance, visual scanning for deficits. Pt completed functional mobility to all therapeutic destinations with no AD and MOD I. Pt reports he feels good about going home soon, and has appreciated all the strategies learned at CIR.   Therapeutic activity Pt identified 3 common household chores and created a list for completion. Overall, pt completes task with MOD I and intermittent cuing. [place] out pots and pans Gather ingredients for dinner  Pt able to locate towels in rehab apartment with directional cue to check alternate location. Folded towels on countertop while dual tasking to hold appropriate conversation. Pt used visual signs to locate items in cabinets for making spaghetti. Able to verbalize all items needed for cooking dinner but required increased time to locate items d/t visual deficits and novel environment. Pt demonstrated good safety awareness during tasks. Retrieved pots and pans out of low cabinets and was able to locate slotted spoon out of cluttered drawer. Pt able to put all items back in correct location with no cues. Pt worked on Music therapist task by sorting a pile of hospital gowns and putting them back on hangers. Pt identified incorrect placement of hanger on hook immediately and corrected it. OTS facilitated further education of scanning techniques for visual deficits throughout activities. Pt able to correctly identify 3/3 room numbers on L & R side while walking back to  room. Pt left at end of session in room, call bell nearby & needs met.    Therapy Documentation Precautions:  Precautions Precautions: Fall Precaution Comments: right visual field deficit Restrictions Weight Bearing Restrictions: No  Therapy/Group: Individual Therapy  Chapel Silverthorn 01/21/2021, 7:01 AM

## 2021-01-21 NOTE — IPOC Note (Signed)
Overall Plan of Care [facility]) Patient Details Name: Lee Blevins MRN: [id number] DOB: [birth year]  Admitting Diagnosis: Intraparenchymal hemorrhage of brain [facility])  Hospital Problems: Principal Problem:   Intraparenchymal hemorrhage of brain ([place])     Functional Problem List: Nursing Bowel, Medication Management, Safety, Endurance  PT Balance, Endurance, Motor, Safety  OT Balance, Cognition, Endurance, Safety, Vision  SLP Cognition, Linguistic  TR         Basic ADL's: OT Grooming, Bathing, Dressing, Toileting     Advanced  ADL's: OT       Transfers: PT Bed Mobility, Bed to Chair, Teacher, early years/pre, Tub/Shower     Locomotion: PT Ambulation, Stairs     Additional Impairments: OT None  SLP Communication comprehension, expression    TR      Anticipated Outcomes Item Anticipated Outcome  Self Feeding independent  Swallowing      Basic self-care  modified independent  Toileting  independent   Bathroom Transfers modified independent  Bowel/Bladder  manage bowel w mod I assist  Transfers  mod I  Locomotion  supervision  Communication  sup A  Cognition  sup-to-min A  Pain  n/a  Safety/Judgment  maintain w cues   Therapy Plan: PT Intensity: Minimum of 1-2 x/day ,45 to 90 minutes PT Frequency: 5 out of 7 days PT Duration Estimated Length of Stay: 3-6 days OT Intensity: Minimum of 1-2 x/day, 45 to 90 minutes OT Frequency: 5 out of 7 days OT Duration/Estimated Length of Stay: 5-7 days SLP Intensity: Minumum of 1-2 x/day, 30 to 90 minutes SLP Frequency: 3 to 5 out of 7 days SLP Duration/Estimated Length of Stay: 10/11   Due to the current state of emergency, patients may not be receiving their 3-hours of Medicare-mandated therapy.   Team Interventions: Nursing Interventions Disease Management/Prevention, Medication Management, Discharge Planning, Bowel Management, Patient/Family Education  PT interventions Ambulation/gait training, Cognitive  remediation/compensation, Discharge planning, DME/adaptive equipment instruction, Functional mobility training, Pain management, Psychosocial support, Therapeutic Activities, UE/LE Strength taining/ROM, Visual/perceptual remediation/compensation, UE/LE Coordination activities, Therapeutic Exercise, Stair training, Skin care/wound management, Patient/family education, Neuromuscular re-education, Disease management/prevention, Community reintegration, Training and development officer  OT Interventions Training and development officer, Discharge planning, Pain management, Self Care/advanced ADL retraining, Therapeutic Activities, UE/LE Coordination activities, Cognitive remediation/compensation, Disease mangement/prevention, Functional mobility training, Patient/family education, Therapeutic Exercise, Visual/perceptual remediation/compensation, Academic librarian, Engineer, drilling, Neuromuscular re-education, Psychosocial support, UE/LE Strength taining/ROM  SLP Interventions Cognitive remediation/compensation, Internal/external aids, Speech/Language facilitation, Cueing hierarchy, Functional tasks, Patient/family education  TR Interventions    SW/CM Interventions Discharge Planning, Psychosocial Support, Patient/Family Education   Barriers to Discharge MD  Medical stability  Nursing Decreased caregiver support, Home environment access/layout 1 level 2ste w wife(physically limited)  PT Insurance for SNF coverage, Lack of/limited family support, Other (comments) R visual field deficits  OT      SLP      SW Decreased caregiver support     Team Discharge Planning: Destination: PT-Home ,OT- Home , SLP-Home Projected Follow-up: PT-Outpatient PT, 24 hour supervision/assistance, OT-  Outpatient OT, SLP-Outpatient SLP Projected Equipment Needs: PT-To be determined, OT- None recommended by OT, SLP-None recommended by SLP Equipment Details: PT- , OT-  Patient/family involved in discharge  planning: PT- Patient,  OT-Patient, SLP-Patient  MD ELOS: 7 days Medical Rehab Prognosis:  Excellent Assessment: Lee Blevins is a 75 year old man who is admitted to CIR with dysarthria with right side visual deficit secondary to left occipital 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  Small area of acute  ischemia in the left centrum semiovale likely from small vessel disease as well as history of left parietal parenchymal ICH 2011. Medications are being managed, and labs and vitals are being monitored regularly.     See Team Conference Notes for weekly updates to the plan of care

## 2021-01-21 NOTE — Discharge Summary (Signed)
Physical Therapy Discharge Summary  Patient Details  Name: Lee Blevins MRN: [id number] Date of Birth: [birth year]  Today's Date: 01/23/2021 PT Individual Time: 1435-1530 PT Individual Time Calculation (min): 55 min    Patient has met 9 of 9 long term goals due to improved activity tolerance, improved balance, improved postural control, increased strength, ability to compensate for deficits, improved attention, and improved awareness.  Patient to discharge at an ambulatory level Independent.   Patient's care partner is independent to provide the necessary physical and cognitive assistance at discharge.  Reasons goals not met: n/a  Recommendation:  Patient will benefit from ongoing skilled PT services in outpatient setting to continue to advance safe functional mobility, address ongoing impairments in dynamic standing balance, R visual field deficits, global strengthening and endurance, and minimize fall risk.  Equipment: No equipment provided  Reasons for discharge: treatment goals met and discharge from hospital  Patient/family agrees with progress made and goals achieved: Yes  PT Discharge Precautions/Restrictions Precautions Precautions: Fall Precaution Comments: right visual field deficit Restrictions Weight Bearing Restrictions: No Vital Signs Therapy Vitals Temp: 97.8 F (36.6 C) Temp Source: Oral Pulse Rate: 74 Resp: 18 BP: (!) 145/83 Patient Position (if appropriate): Lying Oxygen Therapy SpO2: 96 % O2 Device: Room Air Pain Pain Assessment Pain Scale: 0-10 Pain Score: 0-No pain Pain Interference Pain Interference Pain Effect on Sleep: 0. Does not apply - I have not had any pain or hurting in the past 5 days Pain Interference with Therapy Activities: 0. Does not apply - I have not received rehabilitationtherapy in the past 5 days Pain Interference with Day-to-Day Activities: 1. Rarely or not at all Vision/Perception  Vision - History Ability to See in  Adequate Light: 0 Adequate Vision - Assessment Eye Alignment: Within Functional Limits Ocular Range of Motion: Within Functional Limits Alignment/Gaze Preference: Within Defined Limits Tracking/Visual Pursuits: Able to track stimulus in all quads without difficulty Saccades: Additional head turns occurred during testing Convergence: Within functional limits Additional Comments: reaction time using BITS system - 2 minutes in stance = 1.58 sec (note approx 1 second slower on right side than left), saccades 1-50 9:50 (11.8 sec reaction time) and min cues Perception Perception: Within Functional Limits Praxis Praxis: Intact  Cognition Orientation Level: Oriented X4 Sensation Sensation Light Touch: Appears Intact Hot/Cold: Appears Intact Proprioception: Appears Intact Coordination Gross Motor Movements are Fluid and Coordinated: Yes Fine Motor Movements are Fluid and Coordinated: Yes Finger Nose Finger Test: [facility] Heel Shin Test: [facility] Motor  Motor Motor: Within Functional Limits  Mobility Bed Mobility Bed Mobility: Rolling Right;Rolling Left;Sit to Supine;Supine to Sit Rolling Right: Independent Rolling Left: Independent Supine to Sit: Independent Sit to Supine: Independent Transfers Transfers: Sit to Stand;Stand Pivot Transfers;Stand to Sit Sit to Stand: Independent Stand to Sit: Independent Stand Pivot Transfers: Independent Transfer (Assistive device): None Locomotion  Gait Ambulation: Yes Gait Assistance: Nurse, learning disability (Feet): 300 Feet Assistive device: None Gait Gait: Yes Gait Pattern: Within Functional Limits Stairs / Additional Locomotion Stairs: Yes Stairs Assistance: Supervision/Verbal cueing Stair Management Technique: Two rails Number of Stairs: 12 Height of Stairs: 6 Ramp: Independent Curb: Supervision/Verbal cueing Wheelchair Mobility Wheelchair Mobility: No  Trunk/Postural Assessment  Cervical Assessment Cervical Assessment: Exceptions  to [facility] (forward head) Thoracic Assessment Thoracic Assessment:  (mild kyphosis and rounded shoulders) Lumbar Assessment Lumbar Assessment: Exceptions to [facility] (posterior pelvic tilt) Postural Control Postural Control: Within Functional Limits  Balance Balance Balance Assessed: Yes Standardized Balance Assessment Standardized Balance Assessment: Oceanographer Test [facility] Balance  Test Sit to Stand: Able to stand without using hands and stabilize independently Standing Unsupported: Able to stand safely 2 minutes Sitting with Back Unsupported but Feet Supported on Floor or Stool: Able to sit safely and securely 2 minutes Stand to Sit: Sits safely with minimal use of hands Transfers: Able to transfer safely, minor use of hands Standing Unsupported with Eyes Closed: Able to stand 10 seconds safely Standing Ubsupported with Feet Together: Able to place feet together independently and stand 1 minute safely From Standing, Reach Forward with Outstretched Arm: Can reach confidently >25 cm (10") From Standing Position, Pick up Object from Floor: Able to pick up shoe safely and easily From Standing Position, Turn to Look Behind Over each Shoulder: Looks behind one side only/other side shows less weight shift Turn 360 Degrees: Able to turn 360 degrees safely in 4 seconds or less Standing Unsupported, Alternately Place Feet on Step/Stool: Able to stand independently and complete 8 steps >20 seconds Standing Unsupported, One Foot in Front: Able to plae foot ahead of the other independently and hold 30 seconds Standing on One Leg: Able to lift leg independently and hold 5-10 seconds Total Score: 52 Dynamic Sitting Balance Dynamic Sitting - Level of Assistance: 7: Independent Static Standing Balance Static Standing - Level of Assistance: 7: Independent Dynamic Standing Balance Dynamic Standing - Level of Assistance: 7: Independent Extremity Assessment  RLE Assessment RLE Assessment: Within Functional  Limits LLE Assessment LLE Assessment: Within Functional Limits    Lee Blevins PT 01/21/2021, 7:47 AM

## 2021-01-22 DIAGNOSIS — I10 Essential (primary) hypertension: Secondary | ICD-10-CM | POA: Diagnosis not present

## 2021-01-22 DIAGNOSIS — I619 Nontraumatic intracerebral hemorrhage, unspecified: Secondary | ICD-10-CM | POA: Diagnosis not present

## 2021-01-22 NOTE — Progress Notes (Signed)
PROGRESS NOTE   Subjective/Complaints: Pt sitting up in his chair. No issues currently. Anxious to get home.  ROS: Patient denies fever, rash, sore throat, blurred vision, nausea, vomiting, diarrhea, cough, shortness of breath or chest pain, joint or back pain, headache, or mood change.   Objective:   No results found. No results for input(s): WBC, HGB, HCT, PLT in the last 72 hours.  No results for input(s): NA, K, CL, CO2, GLUCOSE, BUN, CREATININE, CALCIUM in the last 72 hours.   Intake/Output Summary (Last 24 hours) at 01/22/2021 1353 Last data filed at 01/21/2021 1900 Gross per 24 hour  Intake 240 ml  Output --  Net 240 ml        Physical Exam: Vital Signs Blood pressure 132/66, pulse 66, temperature 97.8 F (36.6 C), resp. rate 16, height 6' (1.829 m), weight 75.6 kg, SpO2 96 %. Constitutional: No distress . Vital signs reviewed. HEENT: NCAT, EOMI, oral membranes moist Neck: supple Cardiovascular: RRR without murmur. No JVD    Respiratory/Chest: CTA Bilaterally without wheezes or rales. Normal effort    GI/Abdomen: BS +, non-tender, non-distended Ext: no clubbing, cyanosis, or edema Psych: pleasant and cooperative  Skin: intact Neurological:     Comments: Patient is alert.  Persistent Right homonymous hemianopsia.  some difficulty with naming. Language seemed fairly fluent. Follows simple demonstrated commands. 5/5 strength throughout, sensation intact throughout. No dysmetria   Assessment/Plan: 1. Functional deficits which require 3+ hours per day of interdisciplinary therapy in a comprehensive inpatient rehab setting. Physiatrist is providing close team supervision and 24 hour management of active medical problems listed below. Physiatrist and rehab team continue to assess barriers to discharge/monitor patient progress toward functional and medical goals  Care Tool:  Bathing    Body parts bathed by  patient: Right arm, Left arm, Chest, Abdomen, Front perineal area, Buttocks, Right upper leg, Face, Left lower leg, Right lower leg, Left upper leg         Bathing assist Assist Level: Contact Guard/Touching assist     Upper Body Dressing/Undressing Upper body dressing   What is the patient wearing?: Pull over shirt    Upper body assist Assist Level: Set up assist    Lower Body Dressing/Undressing Lower body dressing      What is the patient wearing?: Underwear/pull up, Pants     Lower body assist Assist for lower body dressing: Contact Guard/Touching assist (standing)     Toileting Toileting    Toileting assist Assist for toileting: Contact Guard/Touching assist     Transfers Chair/bed transfer  Transfers assist     Chair/bed transfer assist level: Supervision/Verbal cueing     Locomotion Ambulation   Ambulation assist      Assist level: Supervision/Verbal cueing Assistive device: No Device Max distance: 500'   Walk 10 feet activity   Assist     Assist level: Supervision/Verbal cueing Assistive device: No Device   Walk 50 feet activity   Assist    Assist level: Supervision/Verbal cueing Assistive device: No Device    Walk 150 feet activity   Assist    Assist level: Supervision/Verbal cueing Assistive device: No Device    Walk 10 feet  on uneven surface  activity   Assist     Assist level: Supervision/Verbal cueing     Wheelchair     Assist Is the patient using a wheelchair?: No             Wheelchair 50 feet with 2 turns activity    Assist            Wheelchair 150 feet activity     Assist          Blood pressure 132/66, pulse 66, temperature 97.8 F (36.6 C), resp. rate 16, height 6' (1.829 m), weight 75.6 kg, SpO2 96 %.    Medical Problem List and Plan: 1.  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  Small area of acute ischemia in the left centrum semiovale likely from small vessel disease as well as history of left parietal parenchymal ICH 2011             -patient may shower             -ELOS/Goals: 5 days modI  -Continue CIR therapies including PT, OT, and SLP  2.  Impaired mobility: continue SCD             -antiplatelet therapy: N/A 3. Pain: Decrease Oxycodone to 2.5mg  daily prn/tramadol as needed 4. Mood: Provide emotional support             -antipsychotic agents: N/A 5. Neuropsych: This patient is not capable of making decisions on his own behalf. 6. Skin/Wound Care: Routine skin checks 7. Fluids/Electrolytes/Nutrition: Routine in and outs with follow-up chemistries 8.  Hypertension.  Very well controlled 10/8.  continue Avapro 150 mg daily.  Monitor with increased mobility. Provided education regarding consuming nuts, citrus fruits.  9.  Asthma.  Chronic prednisone 5 mg daily.  Continue nebulizers. 10.  Hyperlipidemia.  LDL 130, TG 42, HDL 112, VLDL 8, total cholesterol 250, Total cholesterol/HDL ratio is 2.2. Discuss risks and benefits of continuing Zetia and decided to discontinue medicine given association between statins and ICH. Provided dietary education 11.  History of alcohol use.  Alcohol screen negative.  Provide counseling 12. Low protein levels: encouraged choosing high protein foods.  13. Aphasia: improving, continue SLP 14. Disposition: scheduled hospital f/u. D/c Monday  LOS: 5 days A FACE TO FACE EVALUATION WAS PERFORMED  Meredith Staggers 01/22/2021, 1:53 PM

## 2021-01-22 NOTE — Discharge Summary (Signed)
Physician Discharge Summary  Patient ID: Lee Blevins MRN: [id number] DOB/AGE: 1945-07-05 75 y.o.  Admit date: 01/17/2021 Discharge date: 01/24/2021  Discharge Diagnoses:  Principal Problem:   Intraparenchymal hemorrhage of brain ([place]) Pain management Hypertension Asthma Hyperlipidemia History of alcohol use  Discharged Condition: Stable  Significant Diagnostic Studies: CT ANGIO HEAD W OR WO CONTRAST  Result Date: 01/12/2021 : CLINICAL DATA:   Intracranial hemorrhage EXAM: CT ANGIOGRAPHY HEAD AND NECK TECHNIQUE: Multidetector CT imaging of the head and neck was performed using the standard protocol during bolus administration of intravenous contrast. Multiplanar CT image reconstructions and MIPs were obtained to evaluate the vascular anatomy. Carotid stenosis measurements (when applicable) are obtained utilizing NASCET criteria, using the distal internal carotid diameter as the denominator. CONTRAST:   27mL OMNIPAQUE IOHEXOL 350 MG/ML SOLN COMPARISON:   MRI head and CT head 01/11/2021 FINDINGS: CT HEAD FINDINGS Brain: Left occipital high-density hematoma unchanged in size measuring approximately 3.7 x 2.4 cm. Mild surrounding edema. Mild adjacent subarachnoid hemorrhage is unchanged. Ventricle size normal. No midline shift Moderate white matter hypodensity diffusely is chronic and unchanged from prior studies. This is most likely due to chronic microvascular ischemia. No acute ischemic infarct or mass Vascular: Negative for hyperdense vessel Skull: Negative Sinuses: Mucosal edema throughout the paranasal sinuses. Air-fluid level right frontal sinus and sphenoid sinus. Complete opacification right maxillary sinus. Bony thickening of the maxillary sinus bilaterally in the sphenoid sinus. Mastoid sinus clear on the right with mild left mastoid effusion. Orbits: Negative Review of the MIP images confirms the above findings CTA NECK FINDINGS Aortic arch: Standard branching. Imaged portion shows no  evidence of aneurysm or dissection. No significant stenosis of the major arch vessel origins. Atherosclerotic calcification in the aortic arch and proximal left subclavian artery Right carotid system: Mild atherosclerotic disease right carotid bifurcation. No right carotid stenosis or dissection Left carotid system: Mild atherosclerotic calcification left carotid bifurcation. Negative for stenosis or dissection Vertebral arteries: Right vertebral artery dominant and widely patent to the basilar. Small left vertebral artery with small contribution to the basilar. Skeleton: Cervical spondylosis without acute abnormality. Other neck: Negative for mass or adenopathy in the neck Upper chest: Mild apical scarring bilaterally. Remaining lung apices clear. Review of the MIP images confirms the above findings CTA HEAD FINDINGS Anterior circulation: Atherosclerotic calcification in the cavernous carotid on the right with mild stenosis of the supraclinoid segment. Mild atherosclerotic disease left cavernous carotid without stenosis. Anterior and middle cerebral arteries widely patent. No stenosis, large vessel occlusion, or vascular malformation Posterior circulation: Right vertebral artery is widely patent with mild atherosclerotic distally. Right vertebral artery is dominant. Small left vertebral artery. Basilar is widely patent. Superior cerebellar and posterior cerebral arteries are patent. No large vessel occlusion. Negative for vascular malformation related to the left occipital hematoma. Venous sinuses: Normal vascular enhancement Anatomic variants: None Review of the MIP images confirms the above findings IMPRESSION: 1. Left occipital hematoma unchanged in size. Small adjacent subarachnoid hemorrhage unchanged. Differential diagnosis includes hypertensive hemorrhage versus cerebral amyloid. No underlying vascular malformation on CTA 2. No significant carotid or vertebral artery stenosis in the neck. Electronically  Signed By: Franchot Gallo M.D. On: 01/12/2021 13:48 Electronically Signed   By: Franchot Gallo M.D.   On: 01/12/2021 14:09   CT HEAD WO CONTRAST  Result Date: 01/11/2021 CLINICAL DATA:  Intracranial hemorrhage follow-up EXAM: CT HEAD WITHOUT CONTRAST TECHNIQUE: Contiguous axial images were obtained from the base of the skull through the vertex without intravenous contrast. COMPARISON:  None. FINDINGS: Brain: Unchanged appearance of left occipital intraparenchymal hematoma with mild surrounding edema. There is periventricular hypoattenuation compatible with chronic microvascular disease. Unchanged appearance of multiple old infarcts. Generalized volume loss. Vascular: No abnormal hyperdensity of the major intracranial arteries or dural venous sinuses. No intracranial atherosclerosis. Skull: The visualized skull base, calvarium and extracranial soft tissues are normal. Sinuses/Orbits: Small amount of left mastoid fluid. Opacification of the right maxillary sinus and bilateral anterior ethmoid air cells. Partial opacification of the right frontal sinus. The orbits are normal. IMPRESSION: 1. Unchanged appearance of left occipital intraparenchymal hematoma with mild surrounding edema. 2. Unchanged appearance of multiple old infarcts and chronic microvascular ischemia. 3. Paranasal sinus disease. Electronically Signed   By: Ulyses Jarred M.D.   On: 01/11/2021 21:54   CT HEAD WO CONTRAST  Result Date: 01/11/2021 CLINICAL DATA:  Aphasia EXAM: CT HEAD WITHOUT CONTRAST TECHNIQUE: Contiguous axial images were obtained from the base of the skull through the vertex without intravenous contrast. COMPARISON:  04/11/2010 FINDINGS: Brain: Area of hemorrhage noted in the left occipital lobe measuring 3.7 x 2.8 x 2.6 cm, volume of 13.5 mL. No mass effect or midline shift. Area of encephalomalacia within the posterior left parietal lobe in area of prior hemorrhage. There is atrophy and chronic small vessel disease changes.  Vascular: No hyperdense vessel or unexpected calcification. Skull: No acute calvarial abnormality. Sinuses/Orbits: Mucosal thickening throughout the paranasal sinuses, right greater than left. No air-fluid levels. Other: None IMPRESSION: Area of acute hemorrhage within the left occipital lobe measuring up to 3.7 cm. Atrophy, chronic small vessel disease. Critical Value/emergent results were called by telephone at the time of interpretation on 01/11/2021 at 4:11 pm to provider [facility] , who verbally acknowledged these results. Electronically Signed   By: Rolm Baptise M.D.   On: 01/11/2021 16:13   CT ANGIO NECK W OR WO CONTRAST  Result Date: 01/12/2021 CLINICAL DATA:  Intracranial hemorrhage EXAM: CT ANGIOGRAPHY HEAD AND NECK TECHNIQUE: Multidetector CT imaging of the head and neck was performed using the standard protocol during bolus administration of intravenous contrast. Multiplanar CT image reconstructions and MIPs were obtained to evaluate the vascular anatomy. Carotid stenosis measurements (when applicable) are obtained utilizing NASCET criteria, using the distal internal carotid diameter as the denominator. CONTRAST:  55mL OMNIPAQUE IOHEXOL 350 MG/ML SOLN COMPARISON:  MRI head and CT head 01/11/2021 FINDINGS: CT HEAD FINDINGS Brain: Left occipital high-density hematoma unchanged in size measuring approximately 3.7 x 2.4 cm. Mild surrounding edema. Mild adjacent subarachnoid hemorrhage is unchanged. Ventricle size normal. No midline shift Moderate white matter hypodensity diffusely is chronic and unchanged from prior studies. This is most likely due to chronic microvascular ischemia. No acute ischemic infarct or mass Vascular: Negative for hyperdense vessel Skull: Negative Sinuses: Mucosal edema throughout the paranasal sinuses. Air-fluid level right frontal sinus and sphenoid sinus. Complete opacification right maxillary sinus. Bony thickening of the maxillary sinus bilaterally in the sphenoid sinus.  Mastoid sinus clear on the right with mild left mastoid effusion. Orbits: Negative Review of the MIP images confirms the above findings CTA NECK FINDINGS Aortic arch: Standard branching. Imaged portion shows no evidence of aneurysm or dissection. No significant stenosis of the major arch vessel origins. Atherosclerotic calcification in the aortic arch and proximal left subclavian artery Right carotid system: Mild atherosclerotic disease right carotid bifurcation. No right carotid stenosis or dissection Left carotid system: Mild atherosclerotic calcification left carotid bifurcation. Negative for stenosis or dissection Vertebral arteries: Right vertebral artery dominant and widely  patent to the basilar. Small left vertebral artery with small contribution to the basilar. Skeleton: Cervical spondylosis without acute abnormality. Other neck: Negative for mass or adenopathy in the neck Upper chest: Mild apical scarring bilaterally. Remaining lung apices clear. Review of the MIP images confirms the above findings CTA HEAD FINDINGS Anterior circulation: Atherosclerotic calcification in the cavernous carotid on the right with mild stenosis of the supraclinoid segment. Mild atherosclerotic disease left cavernous carotid without stenosis. Anterior and middle cerebral arteries widely patent. No stenosis, large vessel occlusion, or vascular malformation Posterior circulation: Right vertebral artery is widely patent with mild atherosclerotic distally. Right vertebral artery is dominant. Small left vertebral artery. Basilar is widely patent. Superior cerebellar and posterior cerebral arteries are patent. No large vessel occlusion. Negative for vascular malformation related to the left occipital hematoma. Venous sinuses: Normal vascular enhancement Anatomic variants: None Review of the MIP images confirms the above findings IMPRESSION: 1. Left occipital hematoma unchanged in size. Small adjacent subarachnoid hemorrhage unchanged.  Differential diagnosis includes hypertensive hemorrhage versus cerebral amyloid. No underlying vascular malformation on CTA 2. No significant carotid or vertebral artery stenosis in the neck. Electronically Signed   By: Franchot Gallo M.D.   On: 01/12/2021 13:48   MR BRAIN W WO CONTRAST  Result Date: 01/12/2021 CLINICAL DATA:  Intracranial hemorrhage follow up EXAM: MRI HEAD WITHOUT AND WITH CONTRAST TECHNIQUE: Multiplanar, multiecho pulse sequences of the brain and surrounding structures were obtained without and with intravenous contrast. CONTRAST:  26mL GADAVIST GADOBUTROL 1 MMOL/ML IV SOLN COMPARISON:  Head CT 01/11/2021 FINDINGS: Brain: Small area of abnormal diffusion restriction in the left centrum semiovale. Large left occipital intraparenchymal hematoma is unchanged allowing for differences in modality. Evidence of chronic hemorrhage over the posterior left parietal lobe. Hyperintense T2-weighted signal is moderately widespread throughout the white matter. Generalized volume loss without a clear lobar predilection. The midline structures are normal. There is no abnormal contrast enhancement. Vascular: Major flow voids are preserved. Skull and upper cervical spine: Normal calvarium and skull base. Visualized upper cervical spine and soft tissues are normal. Sinuses/Orbits:Bilateral mastoid fluid with right maxillary and bilateral ethmoid sinus mucosal thickening. Normal orbits. IMPRESSION: 1. Unchanged size of left occipital intraparenchymal hematoma allowing for differences in modality. 2. Small area of acute ischemia in the left centrum semiovale. 3. Chronic microvascular ischemia and volume loss. Electronically Signed   By: Ulyses Jarred M.D.   On: 01/12/2021 00:01   ECHOCARDIOGRAM COMPLETE  Result Date: 01/13/2021    ECHOCARDIOGRAM REPORT   Patient Name:   Taten Mangine Date of Exam: 01/13/2021 Medical Rec #:  [id number]     Height:       73.0 in Accession #:    [id number]    Weight:       170.0 lb  Date of Birth:  [birth year]      BSA:          2.008 m Patient Age:    32 years      BP:           123/69 mmHg Patient Gender: M             HR:           72 bpm. Exam Location:  Inpatient Procedure: 2D Echo, Cardiac Doppler and Color Doppler Indications:    Stroke I63.9  History:        Patient has prior history of Echocardiogram examinations, most  recent 02/03/2016.  Sonographer:    Merrie Roof RDCS Referring Phys: [place]  1. Left ventricular ejection fraction, by estimation, is 55 to 60%. The left ventricle has normal function. Left ventricular endocardial border not optimally defined to evaluate regional wall motion. Left ventricular diastolic parameters are consistent with Grade I diastolic dysfunction (impaired relaxation).  2. Right ventricular systolic function is normal. The right ventricular size is normal. Tricuspid regurgitation signal is inadequate for assessing PA pressure.  3. The mitral valve is grossly normal. No evidence of mitral valve regurgitation. No evidence of mitral stenosis.  4. The aortic valve is calcified. Aortic valve regurgitation is trivial. Mild aortic valve stenosis. Aortic valve mean gradient measures 15.0 mmHg. Aortic valve Vmax measures 2.57 m/s. Decreased DVI 0.30.  5. The inferior vena cava is normal in size with greater than 50% respiratory variability, suggesting right atrial pressure of 3 mmHg. Comparison(s): A prior study was performed on 02/03/2016. Increase in aortic valve calcium and gradients. FINDINGS  Left Ventricle: Left ventricular ejection fraction, by estimation, is 55 to 60%. The left ventricle has normal function. Left ventricular endocardial border not optimally defined to evaluate regional wall motion. The left ventricular internal cavity size was normal in size. There is no left ventricular hypertrophy. Left ventricular diastolic parameters are consistent with Grade I diastolic dysfunction (impaired relaxation).  Right Ventricle: The right ventricular size is normal. No increase in right ventricular wall thickness. Right ventricular systolic function is normal. Tricuspid regurgitation signal is inadequate for assessing PA pressure. Left Atrium: Left atrial size was normal in size. Right Atrium: Right atrial size was normal in size. Pericardium: There is no evidence of pericardial effusion. Mitral Valve: The mitral valve is grossly normal. There is mild thickening of the mitral valve leaflet(s). No evidence of mitral valve regurgitation. No evidence of mitral valve stenosis. Tricuspid Valve: The tricuspid valve is normal in structure. Tricuspid valve regurgitation is trivial. No evidence of tricuspid stenosis. Aortic Valve: The aortic valve is calcified. Aortic valve regurgitation is trivial. Mild aortic stenosis is present. Aortic valve mean gradient measures 15.0 mmHg. Aortic valve peak gradient measures 26.4 mmHg. Aortic valve area, by VTI measures 0.93 cm. Pulmonic Valve: The pulmonic valve was not well visualized. Pulmonic valve regurgitation is not visualized. Aorta: The aortic root is normal in size and structure. Venous: The inferior vena cava is normal in size with greater than 50% respiratory variability, suggesting right atrial pressure of 3 mmHg. IAS/Shunts: The atrial septum is grossly normal.  LEFT VENTRICLE PLAX 2D LVIDd:         5.00 cm  Diastology LVIDs:         3.20 cm  LV e' medial:    6.31 cm/s LV PW:         1.00 cm  LV E/e' medial:  8.6 LV IVS:        0.90 cm  LV e' lateral:   7.72 cm/s LVOT diam:     2.00 cm  LV E/e' lateral: 7.0 LV SV:         47 LV SV Index:   24 LVOT Area:     3.14 cm  RIGHT VENTRICLE RV Basal diam:  3.30 cm LEFT ATRIUM             Index       RIGHT ATRIUM           Index LA diam:        3.40 cm 1.69 cm/m  RA Area:     22.50 cm LA Vol (A2C):   70.5 ml 35.11 ml/m RA Volume:   70.40 ml  35.06 ml/m LA Vol (A4C):   41.6 ml 20.72 ml/m LA Biplane Vol: 54.2 ml 26.99 ml/m  AORTIC  VALVE AV Area (Vmax):    0.93 cm AV Area (Vmean):   0.92 cm AV Area (VTI):     0.93 cm AV Vmax:           257.00 cm/s AV Vmean:          180.000 cm/s AV VTI:            0.508 m AV Peak Grad:      26.4 mmHg AV Mean Grad:      15.0 mmHg LVOT Vmax:         76.05 cm/s LVOT Vmean:        52.500 cm/s LVOT VTI:          0.150 m LVOT/AV VTI ratio: 0.30  AORTA Ao Root diam: 3.40 cm MITRAL VALVE MV Area (PHT): 2.91 cm    SHUNTS MV Decel Time: 261 msec    Systemic VTI:  0.15 m MV E velocity: 54.00 cm/s  Systemic Diam: 2.00 cm MV A velocity: 78.00 cm/s MV E/A ratio:  0.69 Rudean Haskell MD Electronically signed by Rudean Haskell MD Signature Date/Time: 01/13/2021/3:28:47 PM    Final     Labs:  Basic Metabolic Panel: Recent Labs  Lab 01/18/21 0425  NA 136  K 3.9  CL 102  CO2 26  GLUCOSE 96  BUN 20  CREATININE 0.97  CALCIUM 9.3    CBC: Recent Labs  Lab 01/18/21 0425  WBC 4.9  NEUTROABS 2.6  HGB 14.4  HCT 41.9  MCV 96.5  PLT 209    CBG: Recent Labs  Lab 01/21/21 2124  GLUCAP 98   Family history.  Father with heart disease.  Mother with neurologic disorder suspect MS.  Denies any colon cancer esophageal cancer or rectal cancer  Brief HPI:   Lee Blevins is a 75 y.o. 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  Per chart review lives with spouse independent prior to admission.  Presented 01/11/2021 with blurred vision right side visual deficits and altered mental status.  On arrival to the ED systolic blood pressure in the 150s.  CT/MRI showed area of hemorrhage noted to the left occipital lobe measuring 3 x 7 x 2 by 8 x 2.6 cm volume of 13.5 mL.  No mass-effect or midline shift.  Area of encephalomalacia within the posterior left parietal lobe and area of prior hemorrhage.  CT  angiogram head and neck no significant carotid or vertebral artery stenosis.  Follow-up imaging remained unchanged in size of left occipital intraparenchymal hematoma.  Echocardiogram with ejection fraction of 55 to 23% grade 1 diastolic dysfunction.  Admission chemistries unremarkable alcohol negative.  Therapy evaluations completed due to patient decreased functional mobility was admitted for a comprehensive rehab program   Hospital Course: Ausar Georgiou was admitted to rehab 01/17/2021 for inpatient therapies to consist of PT, ST and OT at least three hours five days a week. Past admission physiatrist, therapy team and rehab RN have worked together to provide customized collaborative inpatient rehab.  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  Small area of acute ischemia in the left centrum semiovale likely from small vessel disease as well as history of left parietal parenchymal ICH 2011.  Remained stable close monitor blood pressure patient would follow-up neurology services.  Pain managed with use of low-dose oxycodone on a limited basis.  Blood pressures controlled and monitored on Avapro will need outpatient follow-up.  History of asthma chronic prednisone as well as nebulizers as directed.  Hyperlipidemia LDL 130 TG 42 HDL 112 VLDL 8 total cholesterol 250 discussed risk and benefits of continuing Zetia decrease patient elected to discontinue.  History of alcohol use receive counts regards cessation of alcohol.   Blood pressures were monitored on TID basis and soft and monitored     Rehab course: During patient's stay in rehab weekly team conferences were held to monitor patient's progress, set goals and discuss barriers to discharge. At admission, patient required supervision supine to sit minimal guard sit to stand minimal assist 200 feet without assistive device  Physical exam.  Blood pressure 141/76 pulse 74 temperature 97.8  respirations 14 oxygen saturation 96% room air Constitutional.  No acute distress HEENT Head.  Normocephalic and atraumatic Eyes.  Right homonymous hemianopsia.  Pupils reactive to light Neck.  Supple nontender no JVD without thyromegaly Cardiac regular rate rhythm not extra sounds or murmur heard Abdomen.  Soft nontender positive bowel sounds without rebound Respiratory effort normal no respiratory distress without wheeze Skin.  Intact Neurologic.  Alert.  He does have some word finding difficulties and paraphasic errors.  Had some difficulty naming repetition.  Follows simple demonstrated commands.  5/5 strength throughout.  Sensation intact.  He/She  has had improvement in activity tolerance, balance, postural control as well as ability to compensate for deficits. He/She has had improvement in functional use RUE/LUE  and RLE/LLE as well as improvement in awareness.  Completed lower body dressing with set up including donning shoes and socks.  Ambulates with distant supervision no assistive device.  Independent upper body ADLs.  He can ambulate up to 200 feet.  He was able to ambulate outdoors without signs of loss of balance. Speech therapy follow-up reading comprehension battery for aphasia for synonyms identification he was correct on 8/10.  Needed some moderate verbal cues.  Full family teaching completed plan discharged to home      Disposition: Discharged home    Diet: Regular  Special Instructions: No driving smoking or alcohol  Medications at discharge 1.  Tylenol as needed 2.  Avapro 150 mg p.o. daily 3.  Dulera 2 puffs twice daily 4.  Singulair 10 mg p.o. nightly 5.  Protonix 40 mg p.o. twice daily 6.  Prednisone 5 mg p.o. daily with breakfast 7  Senokot S1 tablet p.o. twice daily  30-35 minutes were spent completing discharge summary and discharge planning  Discharge Instructions     Ambulatory referral to Neurology   Complete by: As directed    An appointment is  requested in approximately: 4 weeks intraparenchymal hemorrhage   Ambulatory referral to Physical Medicine Rehab   Complete by: As directed    Moderate complexity follow-up 1 to 2 weeks intraparenchymal hemorrhage        Follow-up Information     [name], Clide Deutscher, MD Follow up.   Specialty: Physical Medicine and Rehabilitation Why: 02/08/21 please arrive at 11:20am for 11:40am appointment, thank you! Contact information: [address] [place] [address] [phone]         Janith Lima, MD Follow  up.   Specialty: Internal Medicine Contact information: [place] [address] [phone]                 Signed: Cathlyn Parsons 01/24/2021, 5:16 AM

## 2021-01-23 NOTE — Progress Notes (Signed)
Discharge Rehabilitation Medication Review by a Pharmacist  A complete drug regimen review was completed for this patient to identify any potential clinically significant medication issues.  High Risk Drug Classes Is patient taking? Indication by Medication  Antipsychotic No   Anticoagulant No   Antibiotic No   Opioid Yes Oxycodone as needed for pain  Antiplatelet No   Hypoglycemics/insulin No   Vasoactive Medication Yes Avapro for BP  Chemotherapy No   Other No      Type of Medication Issue Identified Description of Issue Recommendation(s)  Drug Interaction(s) (clinically significant)     Duplicate Therapy     Allergy     No Medication Administration End Date     Incorrect Dose     Additional Drug Therapy Needed     Significant med changes from prior encounter (inform family/care partners about these prior to discharge).    Other       Clinically significant medication issues were identified that warrant physician communication and completion of prescribed/recommended actions by midnight of the next day:  No  Pharmacist comments: No issues identified  Time spent performing this drug regimen review (minutes):  20 minutes   Tad Moore 01/23/2021 11:02 AM

## 2021-01-23 NOTE — Progress Notes (Addendum)
Speech Language Pathology Daily Session Note  Patient Details  Name: Neely Cecena MRN: [id number] Date of Birth: 1945-09-13  Today's Date: 01/23/2021 SLP Individual Time: 0915-1000 SLP Individual Time Calculation (min): 45 min  Short Term Goals: Week 1: SLP Short Term Goal 1 (Week 1): STG=LTG due to ELOS  Skilled Therapeutic Interventions:   Pt was seen in therapy room for skilled ST therapy focused on auditory and reading comprehension. Pt required modA verbal and gestural cueing during comprehension tasks to read and answer questions. Pt benefited from simple visuals to aid comprehension. Pt also benefited from reading aloud each instruction x2 prior to attempting to answer each question to assist in processing of information. SLP edu on how breaking information into smaller chunks to aid comprehension. Pt required assistance for visual scanning throughout task. Provided pt with handout regarding communication strategies to increase comprehension (I.e. ask for repetition whe you don't understand, etc). Pt reported he believes he could benefit from these strategies and reportedly will share with family. Pt left in room with all immediate needs in reach.   Pain Pain Assessment Pain Score: 0-No pain  Therapy/Group: Individual Therapy  Verdene Lennert 01/23/2021, 9:35 AM

## 2021-01-23 NOTE — Progress Notes (Signed)
Physical Therapy Session Note  Patient Details  Name: Lee Blevins MRN: [id number] Date of Birth: [birth year]  Today's Date: 01/23/2021 PT Individual Time: 1435-1530 PT Individual Time Calculation (min): 55 min   Short Term Goals: Week 1:  PT Short Term Goal 1 (Week 1): STG = LTG d/t ELOS  Skilled Therapeutic Interventions/Progress Updates: Pt presented in recliner agreeable to therapy. Pt denies pain during session. Pt is mod I in room therefore ambulated to ortho gym at mod I level. Pt participated in functional activities in preparation for d/c. Pt performed car transfer, ramp, ambulation on mulch at mod I and supervision level respectively. Performed furniture transfer in ADL apt at mod I level. Pt then ambulated to main gym and performed stairs x 12 using 1 -2 rails at pt's discretion at supervision level. Participated in [employer] assessment with an improved score of 52/56. (<36= high risk for falls, close to 100%; 37-45 significant >80%; 46-51 moderate >50%; 52-55 lower >25%).  Participated in use of rebounder with 1Kg weighted ball forward and then L/R for trunk rotation x 15 each. Pt then carried milk crate with 1Kg ball to day room and participated in NuStep L5 x 10 minutes for general conditioning maintaining an average of 60-70 SPM. Pt completed session with 1 bout of cornhole with emphasis on reaching and dynamic balance, pt was able to assist PTA in picking up bean bags from floor. Pt ambulated back to room in same manner as prior and left at recliner with all needs met.       Therapy Documentation Precautions:  Precautions Precautions: Fall Precaution Comments: right visual field deficit Restrictions Weight Bearing Restrictions: No General:   Vital Signs:   Pain: Pain Assessment Pain Scale: 0-10 Pain Score: 0-No pain Mobility: Bed Mobility Rolling Right: Independent Rolling Left: Independent Supine to Sit: Independent Sit to Supine: Independent Transfers Sit to  Stand: Independent Stand to Sit: Independent Stand Pivot Transfers: Independent Transfer (Assistive device): None Locomotion :    Trunk/Postural Assessment : Postural Control Postural Control: Within Functional Limits  Balance: Balance Balance Assessed: Yes Standardized Balance Assessment Standardized Balance Assessment: Berg Balance Test Berg Balance Test Sit to Stand: Able to stand without using hands and stabilize independently Standing Unsupported: Able to stand safely 2 minutes Sitting with Back Unsupported but Feet Supported on Floor or Stool: Able to sit safely and securely 2 minutes Stand to Sit: Sits safely with minimal use of hands Transfers: Able to transfer safely, minor use of hands Standing Unsupported with Eyes Closed: Able to stand 10 seconds safely Standing Ubsupported with Feet Together: Able to place feet together independently and stand 1 minute safely From Standing, Reach Forward with Outstretched Arm: Can reach confidently >25 cm (10") From Standing Position, Pick up Object from Floor: Able to pick up shoe safely and easily From Standing Position, Turn to Look Behind Over each Shoulder: Looks behind one side only/other side shows less weight shift Turn 360 Degrees: Able to turn 360 degrees safely in 4 seconds or less Standing Unsupported, Alternately Place Feet on Step/Stool: Able to stand independently and complete 8 steps >20 seconds Standing Unsupported, One Foot in Front: Able to plae foot ahead of the other independently and hold 30 seconds Standing on One Leg: Able to lift leg independently and hold 5-10 seconds Total Score: 52 Dynamic Sitting Balance Dynamic Sitting - Level of Assistance: 7: Independent Static Standing Balance Static Standing - Level of Assistance: 7: Independent Dynamic Standing Balance Dynamic Standing - Level of  Assistance: 7: Independent Exercises:   Other Treatments:      Therapy/Group: Individual Therapy  Julie-Ann Vanmaanen 01/23/2021, 3:34 PM

## 2021-01-23 NOTE — Progress Notes (Signed)
Occupational Therapy Discharge Summary  Patient Details  Name: Lee Blevins MRN: [id number] Date of Birth: 11-05-1945  Today's Date: 01/23/2021 OT Individual Time: 1255-1355 OT Individual Time Calculation (min): 60 min   Patient seated in recliner, pleasant, cooperative and ready for therapy session.   He denies pain and states that he feels ready for discharge to home tomorrow.  Reviewed safety with adl process and visual scanning to right side to avoid obstacles.  Completed reassessment for discharge to include coordination and visual/hand-eye speed as documented below.  He demonstrated ability to ambulate on unit without assistive device with supervision for unfamiliar environments and mod I in room.  Reviewed home exercise activities for visual skills/scanning with good understanding demonstrated.   Patient has met 9 of 9 long term goals due to improved activity tolerance, improved balance, ability to compensate for deficits, improved awareness, and improved coordination.  Patient to discharge at overall Modified Independent level.  Patient's care partner is independent to provide the necessary assistance at discharge.    Reasons goals not met: na  Recommendation:  Patient will benefit from ongoing skilled OT services in outpatient setting to continue to advance functional skills in the area of BADL.  Equipment: No equipment provided  Reasons for discharge: treatment goals met  Patient/family agrees with progress made and goals achieved: Yes  OT Discharge Precautions/Restrictions  Precautions Precautions: Fall Precaution Comments: right visual field deficit Restrictions Weight Bearing Restrictions: No Pain Pain Assessment Pain Scale: 0-10 Pain Score: 0-No pain ADL ADL Eating: Independent Where Assessed-Eating: Chair Grooming: Modified independent Where Assessed-Grooming: Standing at sink Upper Body Bathing: Modified independent Where Assessed-Upper Body Bathing: Chair,  Shower Lower Body Bathing: Modified independent Where Assessed-Lower Body Bathing: Chair, Administrator, sports Dressing: Modified independent (Device) Where Assessed-Upper Body Dressing: Edge of bed Lower Body Dressing: Modified independent Where Assessed-Lower Body Dressing: Edge of bed Toileting: Modified independent Where Assessed-Toileting: Glass blower/designer Method: Counselling psychologist: Raised Adult nurse: Not assessed Social research officer, government: Distant supervision Social research officer, government Method: Heritage manager: Gaffer Baseline Vision/History: 1 Wears glasses Patient Visual Report: Peripheral vision impairment Eye Alignment: Within Functional Limits Ocular Range of Motion: Within Functional Limits Alignment/Gaze Preference: Within Defined Limits Tracking/Visual Pursuits: Able to track stimulus in all quads without difficulty Saccades: Additional head turns occurred during testing Convergence: Within functional limits Visual Fields: Right visual field deficit Additional Comments: reaction time using BITS system - 2 minutes in stance = 1.58 sec (note approx 1 second slower on right side than left), saccades 1-50 9:50 (11.8 sec reaction time) and min cues Perception  Perception: Within Functional Limits Praxis Praxis: Intact Cognition Arousal/Alertness: Awake/alert Orientation Level: Oriented X4 Year: 2022 Month: October Day of Week: Correct Sustained Attention: Appears intact Immediate Memory Recall: Sock;Blue;Bed Memory Recall Sock: Without Cue Memory Recall Blue: Without Cue Memory Recall Bed: Without Cue Safety/Judgment: Appears intact Sensation Sensation Light Touch: Appears Intact Hot/Cold: Appears Intact Proprioception: Appears Intact Coordination Gross Motor Movements are Fluid and Coordinated: Yes Fine Motor Movements are Fluid and Coordinated: Yes 9 Hole Peg Test: L = 25 sec, R =  30 sec                 box and blocks:  L = 46, R = 41 Motor  Motor Motor: Within Functional Limits Mobility  Bed Mobility Rolling Right: Independent Rolling Left: Independent Supine to Sit: Independent Sit to Supine: Independent Transfers Sit to Stand: Independent Stand to Sit: Independent  Trunk/Postural Assessment  Postural Control Postural Control: Within Functional Limits  Balance Balance Balance Assessed: Yes Standardized Balance Assessment Standardized Balance Assessment: Berg Balance Test Berg Balance Test Sit to Stand: Able to stand without using hands and stabilize independently Standing Unsupported: Able to stand safely 2 minutes Sitting with Back Unsupported but Feet Supported on Floor or Stool: Able to sit safely and securely 2 minutes Stand to Sit: Sits safely with minimal use of hands Transfers: Able to transfer safely, minor use of hands Standing Unsupported with Eyes Closed: Able to stand 10 seconds safely Standing Ubsupported with Feet Together: Able to place feet together independently and stand 1 minute safely From Standing, Reach Forward with Outstretched Arm: Can reach confidently >25 cm (10") From Standing Position, Pick up Object from Floor: Able to pick up shoe safely and easily From Standing Position, Turn to Look Behind Over each Shoulder: Looks behind one side only/other side shows less weight shift Turn 360 Degrees: Able to turn 360 degrees safely in 4 seconds or less Standing Unsupported, Alternately Place Feet on Step/Stool: Able to stand independently and complete 8 steps >20 seconds Standing Unsupported, One Foot in Front: Able to plae foot ahead of the other independently and hold 30 seconds Standing on One Leg: Able to lift leg independently and hold 5-10 seconds Total Score: 52 Dynamic Sitting Balance Dynamic Sitting - Level of Assistance: 7: Independent Static Standing Balance Static Standing - Level of Assistance: 7: Independent Dynamic  Standing Balance Dynamic Standing - Level of Assistance: 7: Independent Extremity/Trunk Assessment RUE Assessment RUE Assessment: Within Functional Limits LUE Assessment LUE Assessment: Within Functional Limits   Naziah Portee A Ronold Hardgrove 01/23/2021, 3:19 PM

## 2021-01-24 ENCOUNTER — Other Ambulatory Visit (HOSPITAL_COMMUNITY): Payer: Self-pay

## 2021-01-24 DIAGNOSIS — I619 Nontraumatic intracerebral hemorrhage, unspecified: Secondary | ICD-10-CM | POA: Diagnosis not present

## 2021-01-24 MED ORDER — IRBESARTAN 150 MG PO TABS
150.0000 mg | ORAL_TABLET | Freq: Every day | ORAL | 0 refills | Status: DC
  Filled 2021-01-24: qty 30, 30d supply, fill #0

## 2021-01-24 MED ORDER — ACETAMINOPHEN 325 MG PO TABS: 650.0000 mg | ORAL_TABLET | ORAL | Status: DC | PRN

## 2021-01-24 MED ORDER — ALBUTEROL SULFATE HFA 108 (90 BASE) MCG/ACT IN AERS
2.0000 | INHALATION_SPRAY | Freq: Four times a day (QID) | RESPIRATORY_TRACT | 11 refills | Status: DC | PRN
  Filled 2021-01-24: qty 8.5, 25d supply, fill #0

## 2021-01-24 MED ORDER — PANTOPRAZOLE SODIUM 40 MG PO TBEC
40.0000 mg | DELAYED_RELEASE_TABLET | Freq: Two times a day (BID) | ORAL | 0 refills | Status: DC | PRN
  Filled 2021-01-24: qty 60, 20d supply, fill #0

## 2021-01-24 MED ORDER — PREDNISONE 5 MG PO TABS
5.0000 mg | ORAL_TABLET | Freq: Every day | ORAL | 0 refills | Status: DC
  Filled 2021-01-24: qty 30, 30d supply, fill #0

## 2021-01-24 MED ORDER — OXYCODONE HCL 5 MG PO TABS
2.5000 mg | ORAL_TABLET | Freq: Every day | ORAL | 0 refills | Status: DC | PRN
  Filled 2021-01-24: qty 30, 60d supply, fill #0

## 2021-01-24 MED ORDER — MONTELUKAST SODIUM 10 MG PO TABS
10.0000 mg | ORAL_TABLET | Freq: Every day | ORAL | 0 refills | Status: DC
  Filled 2021-01-24: qty 30, 30d supply, fill #0

## 2021-01-24 MED ORDER — FLUTICASONE-SALMETEROL 250-50 MCG/ACT IN AEPB
INHALATION_SPRAY | RESPIRATORY_TRACT | 0 refills | Status: DC
  Filled 2021-01-24: qty 60, 30d supply, fill #0

## 2021-01-24 NOTE — Plan of Care (Signed)
All goals met. Patient discharged at this time in care of family.  Medications delivered by pharmacy.

## 2021-01-24 NOTE — Progress Notes (Signed)
PROGRESS NOTE   Subjective/Complaints: Ready for d/c today Discussed outpatient therapy plan, dietary recommendations, plan for return to driving, normal activities  ROS: Patient denies fever, rash, sore throat, blurred vision, nausea, vomiting, diarrhea, cough, shortness of breath or chest pain, joint or back pain, headache, or mood change.   Objective:   No results found. No results for input(s): WBC, HGB, HCT, PLT in the last 72 hours.  No results for input(s): NA, K, CL, CO2, GLUCOSE, BUN, CREATININE, CALCIUM in the last 72 hours.   Intake/Output Summary (Last 24 hours) at 01/24/2021 0843 Last data filed at 01/23/2021 1312 Gross per 24 hour  Intake 236 ml  Output --  Net 236 ml        Physical Exam: Vital Signs Blood pressure 127/76, pulse 60, temperature 97.9 F (36.6 C), resp. rate 14, height 6' (1.829 m), weight 75.6 kg, SpO2 97 %. Gen: no distress, normal appearing HEENT: oral mucosa pink and moist, NCAT Cardio: Reg rate Chest: normal effort, normal rate of breathing Abd: soft, non-distended Ext: no edema Psych: pleasant, normal affect  Skin: intact Neurological:     Comments: Patient is alert.  Persistent Right homonymous hemianopsia.  some difficulty with naming. Language seemed fairly fluent. Follows simple demonstrated commands. 5/5 strength throughout, sensation intact throughout. No dysmetria   Assessment/Plan: 1. Functional deficits which require 3+ hours per day of interdisciplinary therapy in a comprehensive inpatient rehab setting. Physiatrist is providing close team supervision and 24 hour management of active medical problems listed below. Physiatrist and rehab team continue to assess barriers to discharge/monitor patient progress toward functional and medical goals  Care Tool:  Bathing    Body parts bathed by patient: Right arm, Left arm, Chest, Abdomen, Front perineal area, Buttocks,  Right upper leg, Face, Left lower leg, Right lower leg, Left upper leg         Bathing assist Assist Level: Supervision/Verbal cueing     Upper Body Dressing/Undressing Upper body dressing   What is the patient wearing?: Pull over shirt    Upper body assist Assist Level: Set up assist    Lower Body Dressing/Undressing Lower body dressing      What is the patient wearing?: Underwear/pull up, Pants     Lower body assist Assist for lower body dressing: Supervision/Verbal cueing     Toileting Toileting    Toileting assist Assist for toileting: Supervision/Verbal cueing     Transfers Chair/bed transfer  Transfers assist     Chair/bed transfer assist level: Independent     Locomotion Ambulation   Ambulation assist      Assist level: Independent Assistive device: No Device Max distance: 268ft   Walk 10 feet activity   Assist     Assist level: Independent Assistive device: No Device   Walk 50 feet activity   Assist    Assist level: Independent Assistive device: No Device    Walk 150 feet activity   Assist    Assist level: Independent Assistive device: No Device    Walk 10 feet on uneven surface  activity   Assist     Assist level: Supervision/Verbal cueing     Wheelchair  Assist Is the patient using a wheelchair?: No             Wheelchair 50 feet with 2 turns activity    Assist            Wheelchair 150 feet activity     Assist          Blood pressure 127/76, pulse 60, temperature 97.9 F (36.6 C), resp. rate 14, height 6' (1.829 m), weight 75.6 kg, SpO2 97 %.    Medical Problem List and Plan: 1.  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  Small area of acute ischemia in the left centrum semiovale likely from small vessel disease as well as history of left parietal parenchymal ICH 2011              -patient may shower             -ELOS/Goals: 5 days modI  -d/c home today 2.  Impaired mobility: continue SCD             -antiplatelet therapy: N/A 3. Pain: d/c oxycodone/tramadol as needed 4. Mood: Provide emotional support             -antipsychotic agents: N/A 5. Neuropsych: This patient is not capable of making decisions on his own behalf. 6. Skin/Wound Care: Routine skin checks 7. Fluids/Electrolytes/Nutrition: Routine in and outs with follow-up chemistries 8.  Hypertension.  Very well controlled 10/8.  continue Avapro 150 mg daily.  Monitor with increased mobility. Provided education regarding consuming nuts, citrus fruits. Discussed that daily grapefruit is a great option  9.  Asthma.  Continue chronic prednisone 5 mg daily.  Continue nebulizers. 10.  Hyperlipidemia.  LDL 130, TG 42, HDL 112, VLDL 8, total cholesterol 250, Total cholesterol/HDL ratio is 2.2. Discuss risks and benefits of continuing Zetia and decided to discontinue medicine given association between statins and ICH. Provided dietary education 11.  History of alcohol use.  Alcohol screen negative.  Provide counseling 12. Low protein levels: encouraged choosing high protein foods.  13. Aphasia: improving, continue SLP 14. Disposition: scheduled hospital f/u. D/c Monday   >30 minutes spent in discharge of patient including review of medications and follow-up appointments, physical examination, and in answering all patient's questions   LOS: 7 days A FACE TO FACE EVALUATION WAS [place] 01/24/2021, 8:43 AM

## 2021-01-24 NOTE — Progress Notes (Signed)
Inpatient Rehabilitation Care Coordinator Discharge Note   Patient Details  Name: Lee Blevins MRN: [id number] Date of Birth: [birth year]   Discharge location: HOME WITH WIFE HOW CNA PROVIDE SUPERVISION LEVEL  Length of Stay: 7 DAYS  Discharge activity level: SUPERVISION LEVEL  Home/community participation: ACTIVE  Patient response MN:OTRRNH Literacy - How often do you need to have someone help you when you read instructions, pamphlets, or other written material from your doctor or pharmacy?: Never  Patient response AF:BXUXYB Isolation - How often do you feel lonely or isolated from those around you?: Never  Services provided included: MD, RD, PT, OT, SLP, RN, CM, Pharmacy, SW  Financial Services:  Financial Services Utilized: Medicare    Choices offered to/list presented to: PT AND WIFE  Follow-up services arranged:  Outpatient, Patient/Family has no preference for HH/DME agencies    Outpatient Servicies: [facility] -PT,OT,SP WILL CALL WIFE TO ARRANGE FOLLOW UP APPOINTMENTS NO EQUIPMENT NEEDS      Patient response to transportation need: Is the patient able to respond to transportation needs?: Yes In the past 12 months, has lack of transportation kept you from medical appointments or from getting medications?: No In the past 12 months, has lack of transportation kept you from meetings, work, or from getting things needed for daily living?: No    Comments (or additional information):WIFE WAS HERE FOR EDUCATION ON Thursday AND IT WENT WELL. PREPARED FOR PT COMING HOME.   Patient/Family verbalized understanding of follow-up arrangements:  Yes  Individual responsible for coordination of the follow-up plan: MARGARET-WFIE [phone]  Confirmed correct DME delivered: Lee Blevins 01/24/2021    Lee Blevins

## 2021-01-25 NOTE — Progress Notes (Signed)
Speech Language Pathology Discharge Summary  Patient Details  Name: [name] MRN: [id number] Date of Birth: 05/23/1945   Patient has met 4 of 4 long term goals.  Patient to discharge at overall Min;Supervision level.   Reasons goals not met: N/A   Clinical Impression/Discharge Summary: Patient has made functional gains and has met 4 of 4 LTGs this admission. Currently, patient requires overall supervision-Min A multimodal cues for word-finding and verbal expression at the sentence level. Patient also requires overall supervision-Min A verbal cues for complex problem solving and recall of functional information. Patient and family education is complete and patient will discharge home with 24 hour supervision from family. Patient would benefit from f/u SLP services to maximize his cognitive-linguistic functioning prior to discharge.   Care Partner:  Caregiver Able to Provide Assistance: Yes  Type of Caregiver Assistance: Physical;Cognitive  Recommendation:  Outpatient SLP  Rationale for SLP Follow Up: Reduce caregiver burden;Maximize functional communication;Maximize cognitive function and independence   Equipment: N/A   Reasons for discharge: Discharged from hospital;Treatment goals met   Patient/Family Agrees with Progress Made and Goals Achieved: Yes    Rusty Villella, [place] 01/25/2021, 1:05 PM

## 2021-02-02 ENCOUNTER — Encounter: Payer: Self-pay | Admitting: Rehabilitation

## 2021-02-02 ENCOUNTER — Encounter: Payer: Self-pay | Admitting: Speech Pathology

## 2021-02-02 ENCOUNTER — Ambulatory Visit: Payer: Medicare Other | Admitting: Occupational Therapy

## 2021-02-02 ENCOUNTER — Ambulatory Visit: Payer: Medicare Other | Attending: Internal Medicine | Admitting: Rehabilitation

## 2021-02-02 ENCOUNTER — Encounter: Payer: Self-pay | Admitting: Occupational Therapy

## 2021-02-02 ENCOUNTER — Ambulatory Visit: Payer: Medicare Other | Admitting: Speech Pathology

## 2021-02-02 ENCOUNTER — Other Ambulatory Visit: Payer: Self-pay

## 2021-02-02 DIAGNOSIS — R2681 Unsteadiness on feet: Secondary | ICD-10-CM | POA: Diagnosis not present

## 2021-02-02 DIAGNOSIS — R41842 Visuospatial deficit: Secondary | ICD-10-CM | POA: Insufficient documentation

## 2021-02-02 DIAGNOSIS — I69251 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: Secondary | ICD-10-CM | POA: Insufficient documentation

## 2021-02-02 DIAGNOSIS — R278 Other lack of coordination: Secondary | ICD-10-CM

## 2021-02-02 DIAGNOSIS — R41844 Frontal lobe and executive function deficit: Secondary | ICD-10-CM | POA: Insufficient documentation

## 2021-02-02 DIAGNOSIS — M6281 Muscle weakness (generalized): Secondary | ICD-10-CM

## 2021-02-02 DIAGNOSIS — R41840 Attention and concentration deficit: Secondary | ICD-10-CM

## 2021-02-02 DIAGNOSIS — R293 Abnormal posture: Secondary | ICD-10-CM | POA: Diagnosis present

## 2021-02-02 DIAGNOSIS — R4701 Aphasia: Secondary | ICD-10-CM

## 2021-02-02 NOTE — Therapy (Signed)
[facility] [address] [place], [place], [address] Phone: [phone]   Fax:  [phone]  Occupational Therapy Evaluation  Patient Details  Name: Lee Blevins MRN: [id number] Date of Birth: [birth year] Referring Provider (OT): [place] f/u [name]   Encounter Date: 02/02/2021   OT End of Session - 02/02/21 1009     Visit Number 1    Number of Visits 17    Date for OT Re-Evaluation 03/30/21    Authorization Type Medicare A & B/[employer]    Authorization Time Period Follow Medicare Guidelines    Progress Note Due on Visit 10    OT Start Time 1018    OT Stop Time 1100    OT Time Calculation (min) 42 min    Activity Tolerance Patient tolerated treatment well    Behavior During Therapy [facility] for tasks assessed/performed             Past Medical History:  Diagnosis Date   Asthma    Chronic sinusitis    History of CVA (cerebrovascular accident) 2011   hemorrhagic   HTN (hypertension)    Hyperlipidemia    Nasal polyps    Pneumonia     Past Surgical History:  Procedure Laterality Date   NASAL POLYP EXCISION     x2   TONSILLECTOMY      There were no vitals filed for this visit.   Subjective Assessment - 02/02/21 1010     Subjective  Pt is a 75 year old male that presents to Neuro OPOT s/p Nontraumatic ICH on 01/11/21. Pt reports most challenge with "not knowing the internet" and with his vision. Pt reports his primary goal is to "make sure everything works correctly". Pt was independent and driving prior to 8/88/28 and retired. Pt lives with spouse and 1 dog, currently not driving and reports completing all ADLs and IADLs with independence. Pt enjoys working outside and fixing things and doing yardwork.    Pertinent History HTN, Parietal Parenchymal ICH (2011), HLD, Lung Nodules, Hepatic Cirrhosis, BPH, Chronic Hypoxic Respiratory Failure    Limitations Fall Risk, decreased safety awareness, R visual field cut     Patient Stated Goals "make sure everything works correctly"    Currently in Pain? No/denies               [facility] OT Assessment - 02/02/21 1013       Assessment   Medical Diagnosis Intraparenchymal Hemorrhage    Referring Provider (OT) [place] f/u [name]    Onset Date/Surgical Date 01/11/21    Hand Dominance Right    Prior Therapy acute and IP      Precautions   Precautions Fall      Home  Environment   Family/patient expects to be discharged to: Private residence    Living Arrangements Spouse/significant other   + dog   Type of [place] to live on main level with bedroom/bathroom    Bathroom Customer service manager Yes    Additional Comments Pt does not report any dificulty with accessibility with home      Prior Function   Level of Independence Independent    Vocation Retired    Leisure relax, 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 No difficulty reported.      IADL   Prior Level  of Function Light Housekeeping did little prior    Prior Level of Function Meal [employer] did little prior    Prior Level of Function Community Mobility drove prior    [employer] on family or friends for transportation      Written Expression   Dominant Hand Right    Handwriting --   denies any changes     Vision - History   Baseline Vision Wears glasses all the time    Additional Comments pt reports only visual change is missing things in right peripheral      Vision Assessment   Visual Fields Right visual field deficit    Comment Continue to assess - pt reports "right peripheral vision" loss      Observation/Other Assessments   Focus on Therapeutic Outcomes (FOTO)  was not captured at evaluation at front desk      Sensation   Light Touch Appears Intact    Hot/Cold Appears Intact    Additional Comments appears  intact h/e difficulty with maintaining grip on pegs and blocks      Coordination   9 Hole Peg Test Right;Left    Right 9 Hole Peg Test 49.15s   d/c IP 30s   Left 9 Hole Peg Test 30.88s   d/c IP 25s   Box and Blocks R 42, L 42   d/c IP R 41, L 46     Perception   Perception Impaired    Inattention/Neglect Does not attend to right visual field      ROM / Strength   AROM / PROM / Strength AROM;Strength      AROM   Overall AROM  Within functional limits for tasks performed      Strength   Overall Strength Within functional limits for tasks performed      Hand Function   Right Hand Gross Grasp Functional    Right Hand Grip (lbs) 75.1    Left Hand Gross Grasp Functional    Left Hand Grip (lbs) 74.7                                OT Short Term Goals - 02/02/21 1400       OT SHORT TERM GOAL #1   Title Pt will be independent with HEP    Time 4    Period Weeks    Status New    Target Date 03/02/21      OT SHORT TERM GOAL #2   Title Pt will perform environmental scanning with 90% accuracy or greater    Time 4    Period Weeks    Status New      OT SHORT TERM GOAL #3   Title Pt will verbalize understanding of visual strategies for increasing independence and safey with ADLs and IADLs    Time 4    Period Weeks    Status New      OT SHORT TERM GOAL #4   Title Pt will increase coordination in RUE to completing 9 hole peg test in 45 seconds or less    Baseline R 49.15s, L 30.88s    Time 4    Period Weeks    Status New               OT Long Term Goals - 02/02/21 1409       OT LONG TERM GOAL #1   Title Pt will be independent  with any updated HEPs    Time 8    Period Weeks    Status New    Target Date 03/30/21      OT LONG TERM GOAL #2   Title Pt will perform environmental scanning with cognitive component or physical/cognitive task simultaneously with 90% accuracy or greater.    Time 8    Period Weeks    Status New      OT LONG TERM  GOAL #3   Title Pt will improve 9 hole peg test with RUE in 40 seconds or less in order to increase fine motor coordination.    Baseline R 49.15s, L 30.88s    Time 8    Period Weeks    Status New      OT LONG TERM GOAL #4   Title Pt will demonstrate ability to complete Trail Making Test B with 80% accuracy or greater    Baseline eliminated numbers and only did letters at eval    Time 8    Period Weeks    Status New                   Plan - 02/02/21 1011     Clinical Impression Statement Pt is a 75 year old male that presents to Neuro OPOT s/p Nontraumatic ICH on 01/11/2021. Pt with PMH significant for HTN, Parietal Parenchymal ICH (2011), HLD, Lung Nodules, Hepatic Cirrhosis, BPH, Chronic Hypoxic Respiratory Failure. Pt presents with decreased awareness into deficits, deficits with attention and alternating attention specifically, right visual field cut and unsteadiness on feet. Skilled occupational therapy is recommended to target listed areas of deficit and increase independence with ADLs and IADLs.    OT Occupational Profile and History Problem Focused Assessment - Including review of records relating to presenting problem    Occupational performance deficits (Please refer to evaluation for details): ADL's;IADL's;Leisure    Body Structure / Function / Physical Skills ADL;Decreased knowledge of use of DME;Dexterity;Flexibility;FMC;Vision;Endurance;IADL;ROM;UE functional use    Cognitive Skills Attention;Problem Solve;Safety Awareness;Sequencing    Rehab Potential Good    Clinical Decision Making Limited treatment options, no task modification necessary    Comorbidities Affecting Occupational Performance: None    Modification or Assistance to Complete Evaluation  No modification of tasks or assist necessary to complete eval    OT Frequency 2x / week    OT Duration 8 weeks    OT Treatment/Interventions Patient/family education;Neuromuscular education;Functional Mobility  Training;Visual/perceptual remediation/compensation;Cognitive remediation/compensation;Therapeutic exercise;Therapeutic activities;DME and/or AE instruction;Self-care/ADL training    Plan coordination HEP RUE,  environmental scanning    Consulted and Agree with Plan of Care Patient;Family member/caregiver    Family Member Consulted spouse, [name]             Patient will benefit from skilled therapeutic intervention in order to improve the following deficits and impairments:   Body Structure / Function / Physical Skills: ADL, Decreased knowledge of use of DME, Dexterity, Flexibility, FMC, Vision, Endurance, IADL, ROM, UE functional use Cognitive Skills: Attention, Problem Solve, Safety Awareness, Sequencing     Visit Diagnosis: Visuospatial deficit  Attention and concentration deficit  Unsteadiness on feet  Muscle weakness (generalized)  Other lack of coordination  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)  Frontal lobe and executive function deficit    Problem List Patient Active Problem List   Diagnosis Date Noted   Intraparenchymal hemorrhage of brain ([place]) 01/17/2021   Hemorrhagic stroke ([place]) 01/11/2021   ICH (intracerebral  hemorrhage) ([place]) 01/11/2021   Cellulitis of left hand excluding fingers and thumb 12/09/2018   Osteoporosis without current pathological fracture 06/19/2018   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   Allergic asthma, moderate persistent, uncomplicated 16/38/4665   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[place]) 02/08/2016   Cardiomyopathy ([place]) 01/22/2016   Multiple lung nodules on CT 12/14/2015   Nonspecific abnormal electrocardiogram (ECG) (EKG) 12/14/2015   Hepatic cirrhosis ([place]) 09/24/2015   Coronary artery disease involving native coronary artery of native heart without angina pectoris 09/24/2015   Hyperglycemia 06/08/2015   Screen for colon cancer 07/08/2014    Allergy to statin medication 10/23/2012   BPH associated with nocturia 04/18/2012   Routine general medical examination at a health care facility 04/18/2012   Hyperlipidemia with target LDL less than 70 04/27/2010   Essential hypertension 04/27/2010   NASAL POLYP 11/06/2009   RHINOSINUSITIS, 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

## 2021-02-02 NOTE — Therapy (Signed)
[facility] [address] [place], [place], [address] Phone: [phone]   Fax:  [phone]  Physical Therapy Evaluation  Patient Details  Name: Lee Blevins MRN: [id number] Date of Birth: 06/06/1945 Referring Provider (PT): Marlowe [place], [place]   Encounter Date: 02/02/2021   PT End of Session - 02/02/21 1319     Visit Number 1    Number of Visits 9    Date for PT Re-Evaluation 03/04/21    Authorization Type Medicare, [place] (10th visit progress note)    Progress Note Due on Visit 10    PT Start Time [phone]   PT late from previous session   PT Stop Time 0930    PT Time Calculation (min) 38 min    Activity Tolerance Patient tolerated treatment well    Behavior During Therapy [facility] for tasks assessed/performed             Past Medical History:  Diagnosis Date   Asthma    Chronic sinusitis    History of CVA (cerebrovascular accident) 2011   hemorrhagic   HTN (hypertension)    Hyperlipidemia    Nasal polyps    Pneumonia     Past Surgical History:  Procedure Laterality Date   NASAL POLYP EXCISION     x2   TONSILLECTOMY      There were no vitals filed for this visit.    Subjective Assessment - 02/02/21 0856     Subjective "Well I'm doing pretty good at the physical stuff.  I"m getting a little better every day.  My largest problem is my vision on the R side."    Patient is accompained by: Family member   Margaret   Pertinent History hemorrhagic CVA, HTN, HLD, asthma    Limitations Walking;Standing    How long can you stand comfortably? 30 mins    How long can you walk comfortably? 30 mins    Patient Stated Goals "To get back to as normal as I can"    Currently in Pain? No/denies                [facility] PT Assessment - 02/02/21 0900       Assessment   Medical Diagnosis Intraparenchymal Hemorrhage    Referring Provider (PT) Marlowe [place], [place]    Onset Date/Surgical Date 01/11/21    Prior Therapy  acute and IP rehab      Precautions   Precautions Fall      Balance Screen   Has the patient fallen in the past 6 months No    Has the patient had a decrease in activity level because of a fear of falling?  No    Is the patient reluctant to leave their home because of a fear of falling?  No      Home Environment   Living Environment Private residence    Living Arrangements Spouse/significant other    Available Help at Discharge Available 24 hours/day    Type of [place] to enter    Entrance Stairs-Number of Steps 2    Entrance Stairs-Rails Left    Home Layout Multi-level   split level (sunken den)   Alternate Level Stairs-Number of Steps 2    Alternate Level Stairs-Rails None    Home Equipment Shower seat   walk in shower with small ledge to step over     Prior Function   Level of Independence Independent    Vocation Retired  Leisure [place] work, being outside      [employer]   Overall Cognitive Status Impaired/Different from baseline    Attention --   reports difficulty with attention   Memory Appears intact   per pt report     Sensation   Light Touch Appears Intact    Hot/Cold Appears Intact      Coordination   Gross Motor Movements are Fluid and Coordinated Yes    Fine Motor Movements are Fluid and Coordinated Yes    Heel Shin Test WFL (R slightly uncoordinated but improved with repetition)      ROM / Strength   AROM / PROM / Strength Strength      Strength   Overall Strength Deficits    Overall Strength Comments R hip flex and R ankle DF 4/5, all others 5/5      Transfers   Transfers Sit to Stand;Stand to Sit    Sit to Stand 6: Modified independent (Device/Increase time)    Five time sit to stand comments  9.91 secs without UE support    Stand to Sit 6: Modified independent (Device/Increase time)      Ambulation/Gait   Ambulation/Gait Yes    Ambulation/Gait Assistance 6: Modified independent (Device/Increase time);5: Supervision     Ambulation/Gait Assistance Details S during higher level balance challenges    Ambulation Distance (Feet) 300 Feet    Assistive device None    Gait Pattern Step-through pattern;Trunk flexed    Ambulation Surface Level;Indoor    Gait velocity 3.87 ft/sec without device    Stairs Yes    Stairs Assistance 5: Supervision    Stair Management Technique One rail Right;Alternating pattern;Forwards    Number of Stairs 4    Height of Stairs 6      Functional Gait  Assessment   Gait assessed  Yes    Gait Level Surface Walks 20 ft in less than 7 sec but greater than 5.5 sec, uses assistive device, slower speed, mild gait deviations, or deviates 6-10 in outside of the 12 in walkway width.   5.75   Change in Gait Speed Able to smoothly change walking speed without loss of balance or gait deviation. Deviate no more than 6 in outside of the 12 in walkway width.    Gait with Horizontal Head Turns Performs head turns smoothly with slight change in gait velocity (eg, minor disruption to smooth gait path), deviates 6-10 in outside 12 in walkway width, or uses an assistive device.    Gait with Vertical Head Turns Performs head turns with no change in gait. Deviates no more than 6 in outside 12 in walkway width.    Gait and Pivot Turn Pivot turns safely within 3 sec and stops quickly with no loss of balance.    Step Over Obstacle Is able to step over one shoe box (4.5 in total height) without changing gait speed. No evidence of imbalance.    Gait with Narrow Base of Support Ambulates less than 4 steps heel to toe or cannot perform without assistance.    Gait with Eyes Closed Walks 20 ft, slow speed, abnormal gait pattern, evidence for imbalance, deviates 10-15 in outside 12 in walkway width. Requires more than 9 sec to ambulate 20 ft.    Ambulating Backwards Walks 20 ft, uses assistive device, slower speed, mild gait deviations, deviates 6-10 in outside 12 in walkway width.    Steps Alternating feet, must use rail.     Total Score 20  FGA comment: 19-24 = medium risk fall                        Objective measurements completed on examination: See above findings.                PT Education - 02/02/21 1319     Education Details Education on evaluation findings POC, goals    Person(s) Educated Patient;Spouse    Methods Explanation    Comprehension Verbalized understanding              PT Short Term Goals - 02/02/21 1324       PT SHORT TERM GOAL #1   Title =LTGS               PT Long Term Goals - 02/02/21 1324       PT LONG TERM GOAL #1   Title Pt will be IND with final HEP in order to indicate improved functional mobility and balance (Target Date: 03/04/21)    Time 4    Period Weeks    Status New    Target Date 03/04/21      PT LONG TERM GOAL #2   Title Pt will improve FGA to >/=28/30 in order to indicate dec fall risk    Time 4    Period Weeks    Status New      PT LONG TERM GOAL #3   Title Pt will negotiate up/down 4 steps in reciprocal pattern without rail at independent level in order to indicate improved mobility in home/community.    Time 4    Period Weeks    Status New      PT LONG TERM GOAL #4   Title Pt will ambulate 1000' over varying outdoor surfaces while scanning environment at independent level in order to indicate safety at home and in community.    Time 4    Period Weeks    Status New                    Plan - 02/02/21 1320     Clinical Impression Statement Pt is pleasant 75 yo male admitted wtih confusion, aphasia and impaired vision 01/11/21. 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 PMH: hemorrhagic CVA, HTN, HLD, asthma. Upon PT evaluation, note gait speed is 3.87 ft/sec without device indicating he is safe community ambulator, 5TSS time of 9.91 secs without UE support indicating good LE strength, however  did score 20/30 on FGA indicative of medium fall risk.  Feel that pt will benefit from short POC to address high level balance deficits.    Personal Factors and Comorbidities Comorbidity 3+;Age    Comorbidities see above    Examination-Activity Limitations Locomotion Level;Lift;Transfers;Stand;Stairs;Squat    Examination-Participation Restrictions Driving;Community Activity    Stability/Clinical Decision Making Evolving/Moderate complexity    Clinical Decision Making Moderate    Rehab Potential Good    PT Frequency 2x / week    PT Duration 4 weeks    PT Treatment/Interventions ADLs/Self Care Home Management;Gait training;Stair training;Functional mobility training;Therapeutic activities;Therapeutic exercise;Balance training;Neuromuscular re-education;Cognitive remediation;Patient/family education;Vestibular;Visual/perceptual remediation/compensation;Passive range of motion    PT Next Visit Plan Est HEP for high level balance, work on outdoor gait, floor transfer, compliant surfaces, EO/EC, narrow BOS    Consulted and Agree with Plan of Care Patient;Family member/caregiver    Family Member Consulted wife  Patient will benefit from skilled therapeutic intervention in order to improve the following deficits and impairments:  Decreased activity tolerance, Decreased balance, Decreased cognition, Decreased coordination, Decreased endurance, Postural dysfunction, Impaired vision/preception, Decreased strength  Visit Diagnosis: Unsteadiness on feet  Muscle weakness (generalized)  Abnormal posture     Problem List Patient Active Problem List   Diagnosis Date Noted   Intraparenchymal hemorrhage of brain ([place]) 01/17/2021   Hemorrhagic stroke ([place]) 01/11/2021   ICH (intracerebral hemorrhage) ([place]) 01/11/2021   Cellulitis of left hand excluding fingers and thumb 12/09/2018   Osteoporosis without current pathological fracture 06/19/2018   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    Allergic asthma, moderate persistent, uncomplicated 48/54/6270   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   Cardiomyopathy ([place]) 01/22/2016   Multiple lung nodules on CT 12/14/2015   Nonspecific abnormal electrocardiogram (ECG) (EKG) 12/14/2015   Hepatic cirrhosis ([place]) 09/24/2015   Coronary artery disease involving native coronary artery of native heart without angina pectoris 09/24/2015   Hyperglycemia 06/08/2015   Screen for colon cancer 07/08/2014   Allergy to statin medication 10/23/2012   BPH associated with nocturia 04/18/2012   Routine general medical examination at a health care facility 04/18/2012   Hyperlipidemia with target LDL less than 70 04/27/2010   Essential hypertension 04/27/2010   NASAL POLYP 11/06/2009   RHINOSINUSITIS, CHRONIC 02/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 [address] [place], [place], [address] Phone: [phone]   Fax:  [phone] 02/02/21, 1:27 PM   Name: Lee Blevins MRN: [id number] Date of Birth: Jun 29, 1945

## 2021-02-03 ENCOUNTER — Encounter: Payer: Self-pay | Admitting: Speech Pathology

## 2021-02-03 NOTE — Therapy (Signed)
[facility] [address] [place], [place], [address] Phone: [phone]   Fax:  [phone]  Speech Language Pathology Evaluation  Patient Details  Name: Lee Blevins MRN: [id number] Date of Birth: 1945/11/28 Referring Provider (SLP): Cathlyn Parsons PA-C   Encounter Date: 02/02/2021   End of Session - 02/03/21 1130     Visit Number 1    Number of Visits 17    Date for SLP Re-Evaluation 04/05/21    SLP Start Time 0930    SLP Stop Time  0160    SLP Time Calculation (min) 45 min    Activity Tolerance Patient tolerated treatment well             Past Medical History:  Diagnosis Date   Asthma    Chronic sinusitis    History of CVA (cerebrovascular accident) 2011   hemorrhagic   HTN (hypertension)    Hyperlipidemia    Nasal polyps    Pneumonia     Past Surgical History:  Procedure Laterality Date   NASAL POLYP EXCISION     x2   TONSILLECTOMY      There were no vitals filed for this visit.   Subjective Assessment - 02/02/21 0933     Subjective "For what I do at home, I do fine with it."    Patient is accompained by: Family member    Currently in Pain? No/denies                SLP Evaluation [facility] - 02/03/21 1119       SLP Visit Information   SLP Received On 02/02/21    Referring Provider (SLP) Cathlyn Parsons PA-C    Onset Date 01/11/21    Medical Diagnosis ICH      Subjective   Patient/Family Stated Goal to work on understanding      Pain Assessment   Currently in Pain? No/denies      General Information   HPI Pt is a 75 year old male that presents to Neuro OPOT s/p Nontraumatic ICH on 01/11/2021. Pt with PMH significant for HTN, Parietal Parenchymal ICH (2011), HLD, Lung Nodules, Hepatic Cirrhosis, BPH, Chronic Hypoxic Respiratory Failure. Was seen by SLP in inpatient rehab for aphasia and cognitive deficits.      Balance Screen   Has the patient fallen in the past 6 months  No      Prior Functional Status   Cognitive/Linguistic Baseline Within functional limits    Type of [place]    Vocation Retired      Associate Professor   Overall Cognitive Status Impaired/Different from baseline    Area of Impairment Attention;Awareness    Current Attention Level Selective    Executive Function Self Monitoring;Self Correcting      Auditory Comprehension   Overall Auditory Comprehension Impaired    Yes/No Questions Within Functional Limits    Conversation Complex    Interfering Components Attention;Processing speed    EffectiveTechniques Repetition;Slowed speech;Pausing;Extra processing time    Overall Auditory Comprehension Comments Pt exhibits difficulty in understanding conversation and demonstrates anosognosia related to deficits. He requires multiple repetitions of questions asked. Unable to assess following directions (impairment observed informally during CADL assessment) and yes/no questions. Rec further assessment.      Reading Comprehension   Reading Status Impaired    Sentence Level 51-75% accurate    Paragraph Level 26-50%  accurate    Interfering Components Right neglect/inattention      Verbal Expression   Overall Verbal Expression Impaired    Other Verbal Expression Comments Further assessment recommended; Pt reported no difficulty with verbal expression; however, SLP noted occasional paraphasias during conversational speech.      Oral Motor/Sensory Function   Overall Oral Motor/Sensory Function Appears within functional limits for tasks assessed      Motor Speech   Overall Motor Speech Appears within functional limits for tasks assessed      Standardized Assessments   Standardized Assessments  Other Assessment    Other Assessment CADL      Individuals Consulted   Consulted and Agree with Results and Recommendations Patient    Family Member Consulted  wife                              SLP Education - 02/03/21 1130     Education Details Aphasia    Person(s) Educated Patient;Spouse    Methods Explanation;Demonstration    Comprehension Verbalized understanding;Need further instruction              SLP Short Term Goals - 02/03/21 1132       SLP SHORT TERM GOAL #1   Title Complete further assessment of expressive language and receptive language.    Baseline Expression: occasional paraphasias in conversation noted (BNT for baseline?); Receptive language: assess yes/no questions, following directions, reading comprehension at sentence/paragraph level *adjust goals as needed    Time 2    Period Weeks    Status New    Target Date 02/17/21      SLP SHORT TERM GOAL #2   Title Patient will read and follow multiple- step directions to increase comprehension of written information with 80% accuracy given minA across 3 sessions.    Time 4    Period Weeks    Status New    Target Date 03/06/21      SLP SHORT TERM GOAL #3   Title Pt wife/caregiver will identify 3+ strategies to aid pt's comprehension of caregiver provided verbal information independently across 3 sessions.    Time 4    Period Weeks    Status New    Target Date 03/06/21              SLP Long Term Goals - 02/03/21 1145       SLP LONG TERM GOAL #1   Title Patient will read and follow multiple- step directions to increase comprehension of written information with 80% accuracy  across 3 sessions.    Time 8    Period Weeks    Status New    Target Date 03/31/21      SLP LONG TERM GOAL #2   Title Pt wife/caregiver will report use of strategies to aid pt's comprehension of caregiver provided verbal information independently across 3 sessions.    Time 8    Period Weeks    Status New    Target Date 03/31/21              Plan - 02/03/21 1131     Clinical Impression Statement Pt is a 75 yo male who presents for OP ST evaluation post ICH on 01/11/21. Pt  previously received course of IPR ST therapy where reportedly pt was d/c'd requiring minA with recall of functional information, problem solving and verbal expression. Previous therapist also noted deficits in auditory and reading comprehension. Pt  feels like he is "functional" and can do "everything he needs to do" in respect to cognition. He also reported no difficulty word finding and/or word swaps (paraphasias); however, SLP observed pt to demonstrate paraphasias in conversation with decreased error awareness. Prior to assessment, wife felt pt was close to baseline cognitively. Post assessment, wife was in agreement that pt could benefit from additional ST services and was able to provide some other examples of communication breakdowns at home. Reportedly, pt wife has to repeat information several times for patient to understand and recall information. SLP spoke with wife and patient about training in communication strategies to increase pt's understanding of verbal and written language (i.e. providing shorter chunks of information, pt asking for clarification, extra processing time, etc.). Wife was in agreement that this would be helpful in decreasing communicative breakdowns at home. SLP assessed pt using CADL-3. Pt scored a 92/100 which placed him in the 83rd percentile (for those w/ aphasia) and 8th percentile (for those without aphasia). Pt required several repetitions and rephrasing of questions throughout assessment to complete; however, this increased accuracy significantly. R visual neglect and visual attention were noted to be impaired with cues to look at entire page which impact reading. SLP rec further assessment of auditory & reading comprehension, as well as expression (see STGs). SLP rec skilled services to address aphasia to maximize functional communication and decrease instances of communication breakdowns.    Speech Therapy Frequency 2x / week    Duration 8 weeks    Treatment/Interventions  Language facilitation;Environmental controls;Cueing hierarchy;SLP instruction and feedback;Compensatory techniques;Functional tasks;Compensatory strategies;Internal/external aids;Multimodal communcation approach;Patient/family education    Potential to Achieve Goals Good    Consulted and Agree with Plan of Care Patient;Family member/caregiver    Family Member Consulted Wife             Patient will benefit from skilled therapeutic intervention in order to improve the following deficits and impairments:   Aphasia    Problem List Patient Active Problem List   Diagnosis Date Noted   Intraparenchymal hemorrhage of brain ([facility]) 01/17/2021   Hemorrhagic stroke ([place]) 01/11/2021   ICH (intracerebral hemorrhage) ([place]) 01/11/2021   Cellulitis of left hand excluding fingers and thumb 12/09/2018   Osteoporosis without current pathological fracture 06/19/2018   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   Allergic asthma, moderate persistent, uncomplicated 62/13/0865   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   Cardiomyopathy ([place]) 01/22/2016   Multiple lung nodules on CT 12/14/2015   Nonspecific abnormal electrocardiogram (ECG) (EKG) 12/14/2015   Hepatic cirrhosis ([place]) 09/24/2015   Coronary artery disease involving native coronary artery of native heart without angina pectoris 09/24/2015   Hyperglycemia 06/08/2015   Screen for colon cancer 07/08/2014   Allergy to statin medication 10/23/2012   BPH associated with nocturia 04/18/2012   Routine general medical examination at a health care facility 04/18/2012   Hyperlipidemia with target LDL less than 70 04/27/2010   Essential hypertension 04/27/2010   NASAL POLYP 11/06/2009   RHINOSINUSITIS, 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 [place], [place], [address] Phone: [phone]   Fax:   [phone]  Name: Koleman Marling MRN: [id number] Date of Birth: 02/28/1946

## 2021-02-08 ENCOUNTER — Encounter
Payer: Medicare Other | Attending: Physical Medicine and Rehabilitation | Admitting: Physical Medicine and Rehabilitation

## 2021-02-08 ENCOUNTER — Other Ambulatory Visit: Payer: Self-pay

## 2021-02-08 ENCOUNTER — Encounter: Payer: Self-pay | Admitting: Physical Medicine and Rehabilitation

## 2021-02-08 VITALS — BP 180/85 | HR 64 | Ht 72.0 in | Wt 167.0 lb

## 2021-02-08 DIAGNOSIS — I619 Nontraumatic intracerebral hemorrhage, unspecified: Secondary | ICD-10-CM

## 2021-02-08 NOTE — Progress Notes (Signed)
Subjective:    Patient ID: Lee Blevins, male    DOB: 16-Jul-1945, 75 y.o.   MRN: [id number]  HPI Lee Blevins is a 75 year old man who presents to establish care for ICH.  Doing a lot of yard work.  Not requiring blood pressure medication most days He has been eating very well at home.  He is doing outpatient PT and OT.  No pain Biggest complains is the eye sight. It is about the same.  Wife is driving him currently. She wants him to be able to resume his driving.   Pain Inventory Average Pain 0 Pain Right Now 0 My pain is  no pain  LOCATION OF PAIN  no pain  BOWEL Number of stools per week: 7   BLADDER Normal    Mobility walk without assistance ability to climb steps?  yes do you drive?  no Do you have any goals in this area?  no  Function not employed: date last employed 2005 Do you have any goals in this area?  no  Neuro/Psych No problems in this area  Prior Studies Any changes since last visit?  no  Physicians involved in your care Any changes since last visit?  no   Family History  Problem Relation Age of Onset   Heart disease Father    Valvular heart disease Father    Neurologic Disorder Mother 27       Similar to MS   Cancer Neg Hx    Early death Neg Hx    Hearing loss Neg Hx    Hyperlipidemia Neg Hx    Hypertension Neg Hx    Stroke Neg Hx    Kidney disease Neg Hx    Social History   Socioeconomic History   Marital status: Married    Spouse name: Not on file   Number of children: Not on file   Years of education: Not on file   Highest education level: Not on file  Occupational History   Occupation: RETIRED    Employer: RETIRED    Comment: Public affairs consultant  Tobacco Use   Smoking status: Former    Packs/day: 1.00    Years: 1.00    Pack years: 1.00    Types: Cigarettes    Quit date: 04/17/1973    Years since quitting: 47.8   Smokeless tobacco: Never   Tobacco comments:    1975  Vaping Use   Vaping Use: Never used  Substance and  Sexual Activity   Alcohol use: Yes    Alcohol/week: 15.0 standard drinks    Types: 15 Glasses of wine per week   Drug use: No   Sexual activity: Not Currently  Other Topics Concern   Not on file  Social History Narrative   Regular Exercise -  YES   Social Determinants of Health   Financial Resource Strain: Not on file  Food Insecurity: Not on file  Transportation Needs: Not on file  Physical Activity: Not on file  Stress: Not on file  Social Connections: Not on file   Past Surgical History:  Procedure Laterality Date   NASAL POLYP EXCISION     x2   TONSILLECTOMY     Past Medical History:  Diagnosis Date   Asthma    Chronic sinusitis    History of CVA (cerebrovascular accident) 2011   hemorrhagic   HTN (hypertension)    Hyperlipidemia    Nasal polyps    Pneumonia    Ht 6' (1.829 m)  Wt 167 lb (75.8 kg)   BMI 22.65 kg/m   Opioid Risk Score:   Fall Risk Score:  `1  Depression screen PHQ 2/9  Depression screen [facility] 2/9 02/08/2021 06/19/2018 06/24/2014 10/23/2012  Decreased Interest 0 0 0 0  Down, Depressed, Hopeless 0 0 0 0  PHQ - 2 Score 0 0 0 0  Altered sleeping 0 - - -  Tired, decreased energy 0 - - -  Change in appetite 0 - - -  Feeling bad or failure about yourself  0 - - -  Trouble concentrating 0 - - -  Moving slowly or fidgety/restless 0 - - -  Suicidal thoughts 0 - - -  PHQ-9 Score 0 - - -  Difficult doing work/chores Not difficult at all - - -     Review of Systems  Constitutional: Negative.   HENT: Negative.    Eyes: Negative.   Respiratory: Negative.    Cardiovascular: Negative.   Gastrointestinal: Negative.   Endocrine: Negative.   Genitourinary: Negative.   Musculoskeletal: Negative.   Skin: Negative.   Allergic/Immunologic: Negative.   Neurological: Negative.   Hematological: Negative.   Psychiatric/Behavioral: Negative.        Objective:   Physical Exam  Gen: no distress, normal appearing HEENT: oral mucosa pink and moist,  NCAT Cardio: Reg rate Chest: normal effort, normal rate of breathing Abd: soft, non-distended Ext: no edema Psych: pleasant, normal affect Skin: intact Neuro: Alert and oriented x3. Visual field deficit    Assessment & Plan:  Lee Blevins is a 75 year old man who presents for hospital follow-up after ICH.   1) ICH -continue therapies -provided dietary and exercise counseling -discussed that hypertension is number one reversible risk factor for stroke.   -reviewed all medications -discussed potential stroke etiologies -made goal to reduce life stressors  2) HTN: -BP is 180/85 today but has been very well controlled on home reads. Advised getting new BP monitor at home as current one is 77 years old to ensure accurate readings at home. Does have some white coat syndrome which could be contributing to elevated BP in office -Advised checking BP daily at home and logging results to bring into follow-up appointment with her PCP and myself. -Reviewed BP meds today. Continue Avapro as wife is administering -Advised regarding healthy foods that can help lower blood pressure and provided with a list: 1) citrus foods- high in vitamins and minerals 2) salmon and other fatty fish - reduces inflammation and oxylipins 3) swiss chard (leafy green)- high level of nitrates 4) pumpkin seeds- one of the best natural sources of magnesium 5) Beans and lentils- high in fiber, magnesium, and potassium 6) Berries- high in flavonoids 7) Amaranth (whole grain, can be cooked similarly to rice and oats)- high in magnesium and fiber 8) Pistachios- even more effective at reducing BP than other nuts 9) Carrots- high in phenolic compounds that relax blood vessels and reduce inflammation 10) Celery- contain phthalides that relax tissues of arterial walls 11) Tomatoes- can also improve cholesterol and reduce risk of heart disease 12) Broccoli- good source of magnesium, calcium, and potassium 13) Greek yogurt: high  in potassium and calcium 14) Herbs and spices: Celery seed, cilantro, saffron, lemongrass, black cumin, ginseng, cinnamon, cardamom, sweet basil, and ginger 15) Chia and flax seeds- also help to lower cholesterol and blood sugar 16) Beets- high levels of nitrates that relax blood vessels  17) spinach and bananas- high in potassium  -Provided lise of supplements that  can help with hypertension:  1) magnesium: one high quality brand is Bioptemizers since it contains all 7 types of magnesium, otherwise over the counter magnesium gluconate 400mg  is a good option 2) B vitamins 3) vitamin D 4) potassium 5) CoQ10 6) L-arginine 7) Vitamin C 8) Beetroot -Educated that goal BP is 120/80. -Made goal to incorporate some of the above foods into diet.

## 2021-02-08 NOTE — Patient Instructions (Signed)
HTN: -Advised checking BP daily at home and logging results to bring into follow-up appointment with her PCP and myself. -Reviewed BP meds today.  -Advised regarding healthy foods that can help lower blood pressure and provided with a list: 1) citrus foods- high in vitamins and minerals 2) salmon and other fatty fish - reduces inflammation and oxylipins 3) swiss chard (leafy green)- high level of nitrates 4) pumpkin seeds- one of the best natural sources of magnesium 5) Beans and lentils- high in fiber, magnesium, and potassium 6) Berries- high in flavonoids 7) Amaranth (whole grain, can be cooked similarly to rice and oats)- high in magnesium and fiber 8) Pistachios- even more effective at reducing BP than other nuts 9) Carrots- high in phenolic compounds that relax blood vessels and reduce inflammation 10) Celery- contain phthalides that relax tissues of arterial walls 11) Tomatoes- can also improve cholesterol and reduce risk of heart disease 12) Broccoli- good source of magnesium, calcium, and potassium 13) Greek yogurt: high in potassium and calcium 14) Herbs and spices: Celery seed, cilantro, saffron, lemongrass, black cumin, ginseng, cinnamon, cardamom, sweet basil, and ginger 15) Chia and flax seeds- also help to lower cholesterol and blood sugar 16) Beets- high levels of nitrates that relax blood vessels  17) spinach and bananas- high in potassium  -Provided lise of supplements that can help with hypertension:  1) magnesium: one high quality brand is Bioptemizers since it contains all 7 types of magnesium, otherwise over the counter magnesium gluconate 400mg  is a good option 2) B vitamins 3) vitamin D 4) potassium 5) CoQ10 6) L-arginine 7) Vitamin C 8) Beetroot -Educated that goal BP is 120/80. -Made goal to incorporate some of the above foods into diet.

## 2021-02-09 ENCOUNTER — Other Ambulatory Visit: Payer: Self-pay | Admitting: Internal Medicine

## 2021-02-10 ENCOUNTER — Telehealth: Payer: Self-pay | Admitting: Internal Medicine

## 2021-02-10 MED ORDER — FLUTICASONE-SALMETEROL 250-50 MCG/ACT IN AEPB: INHALATION_SPRAY | RESPIRATORY_TRACT | 3 refills | Status: DC

## 2021-02-10 NOTE — Telephone Encounter (Signed)
Rx has been sent to preferred pharmacy for pt. Called and spoke with pt's spouse letting her know this had been done and she verbalized understanding. Nothing further needed.

## 2021-02-11 ENCOUNTER — Other Ambulatory Visit: Payer: Self-pay | Admitting: Internal Medicine

## 2021-02-16 ENCOUNTER — Encounter: Payer: Self-pay | Admitting: Speech Pathology

## 2021-02-16 ENCOUNTER — Ambulatory Visit: Payer: Medicare Other | Admitting: Occupational Therapy

## 2021-02-16 ENCOUNTER — Encounter: Payer: Self-pay | Admitting: Occupational Therapy

## 2021-02-16 ENCOUNTER — Other Ambulatory Visit: Payer: Self-pay

## 2021-02-16 ENCOUNTER — Ambulatory Visit: Payer: Medicare Other | Attending: Internal Medicine | Admitting: Speech Pathology

## 2021-02-16 DIAGNOSIS — R2681 Unsteadiness on feet: Secondary | ICD-10-CM | POA: Insufficient documentation

## 2021-02-16 DIAGNOSIS — I69251 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: Secondary | ICD-10-CM | POA: Insufficient documentation

## 2021-02-16 DIAGNOSIS — M6281 Muscle weakness (generalized): Secondary | ICD-10-CM | POA: Diagnosis present

## 2021-02-16 DIAGNOSIS — R278 Other lack of coordination: Secondary | ICD-10-CM

## 2021-02-16 DIAGNOSIS — R41844 Frontal lobe and executive function deficit: Secondary | ICD-10-CM | POA: Insufficient documentation

## 2021-02-16 DIAGNOSIS — R41841 Cognitive communication deficit: Secondary | ICD-10-CM | POA: Diagnosis present

## 2021-02-16 DIAGNOSIS — R41842 Visuospatial deficit: Secondary | ICD-10-CM | POA: Diagnosis present

## 2021-02-16 DIAGNOSIS — R4701 Aphasia: Secondary | ICD-10-CM | POA: Insufficient documentation

## 2021-02-16 DIAGNOSIS — R41840 Attention and concentration deficit: Secondary | ICD-10-CM | POA: Diagnosis present

## 2021-02-16 DIAGNOSIS — R293 Abnormal posture: Secondary | ICD-10-CM | POA: Insufficient documentation

## 2021-02-16 NOTE — Patient Instructions (Signed)
Activities to try at home to encourage visual scanning:   1. Word searches 2. Mazes 3. Puzzles 4. Card games 5. Computer games and/or searches 6. Connect-the-dots  Activities for environmental (larger) scanning:  1. With supervision, scan for items in grocery store or drugstore.  Begin with a familiar store, then progress to a new store you've never been in before. Make sure you have supervision with this.  2. With supervision, tell a family member or caregiver when it is safe to cross a street after looking all directions and any side streets. However, do NOT cross street unless family member or caregiver is with you and says it is OK 

## 2021-02-16 NOTE — Therapy (Signed)
[facility] [address] [place], [place], [address] Phone: [phone]   Fax:  [phone]  Speech Language Pathology Treatment  Patient Details  Name: Lee Blevins MRN: [id number] Date of Birth: [birth year] Referring Provider (SLP): Cathlyn Parsons PA-C   Encounter Date: 02/16/2021   End of Session - 02/16/21 1549     Visit Number 2    Number of Visits 17    Date for SLP Re-Evaluation 04/05/21    SLP Start Time 57    SLP Stop Time  7253    SLP Time Calculation (min) 40 min    Activity Tolerance Patient tolerated treatment well             Past Medical History:  Diagnosis Date   Asthma    Chronic sinusitis    History of CVA (cerebrovascular accident) 2011   hemorrhagic   HTN (hypertension)    Hyperlipidemia    Nasal polyps    Pneumonia     Past Surgical History:  Procedure Laterality Date   NASAL POLYP EXCISION     x2   TONSILLECTOMY      There were no vitals filed for this visit.   Subjective Assessment - 02/16/21 1535     Subjective "I feel like things are going well."    Patient is accompained by: Family member    Currently in Pain? No/denies                   ADULT SLP TREATMENT - 02/16/21 1652       General Information   Behavior/Cognition Alert;Cooperative;Pleasant mood      Treatment Provided   Treatment provided Cognitive-Linquistic      Cognitive-Linquistic Treatment   Treatment focused on Cognition    Skilled Treatment Completed assessment of cognitive-linguistic skills. Pt scored the following: 3/5 Following 3-step directions; Complex Yes/No questions 4/5; Simple Narrative Discourse 3/4; Reading Comprehension 9/15. SLP completed the SLUMS. Pt scored a 14/30 on the SLUMS. Pt demonstrated difficulty with orientation (unable to tell year), working and short term memory, attention, executive functioning and cognitive flexibility. To update goals to reflect  challenges.      Assessment / Recommendations / Plan   Plan Continue with current plan of care      Progression Toward Goals   Progression toward goals Progressing toward goals                SLP Short Term Goals - 02/16/21 1551       SLP SHORT TERM GOAL #1   Title Complete further assessment of expressive language and receptive language.    Baseline Expression: occasional paraphasias in conversation noted (BNT for baseline?); Receptive language: assess yes/no questions, following directions, reading comprehension at sentence/paragraph level *adjust goals as needed    Time 2    Period Weeks    Status Achieved    Target Date 02/17/21      SLP SHORT TERM GOAL #2   Title Patient will read and follow multiple- step directions to increase working memory/ comprehension of written information with 80% accuracy given minA across 3 sessions.    Time 4    Period Weeks    Status Revised    Target Date 03/06/21      SLP SHORT TERM GOAL #3   Title Pt wife/caregiver will identify 3+ strategies to aid pt's memory/comprehension of caregiver provided verbal information independently across 3 sessions.    Time 4    Period Weeks  Status Deferred    Target Date 03/06/21      SLP SHORT TERM GOAL #4   Title Pt will recall and demonstrate 3 memory strategies to increase recall of important information at home independently across 2 sessions.    Time 4    Period Weeks    Status New    Target Date 03/06/21      SLP SHORT TERM GOAL #5   Title Pt will recall and demonstrate 3 attention strategies to increase recall of important information at home independently across 2 sessions.    Time 4    Period Weeks    Status New    Target Date 03/06/21              SLP Long Term Goals - 02/16/21 1703       SLP LONG TERM GOAL #1   Title Patient will read and follow multiple- step directions to increase comprehension of written information with 80% accuracy  across 3 sessions.    Time 8     Period Weeks    Status New    Target Date 03/31/21      SLP LONG TERM GOAL #2   Title Pt wife/caregiver will report use of strategies to aid pt's comprehension of caregiver provided verbal information independently across 3 sessions.    Time 8    Period Weeks    Status New    Target Date 03/31/21      SLP LONG TERM GOAL #3   Title Pt will report use of 1+ attention strategies to increase recall of important information at home independently.    Time 8    Period Weeks    Status New    Target Date 03/31/21      SLP LONG TERM GOAL #4   Title Pt will report use 1+ memory strategies to increase recall of important information at home independently across 2 sessions.    Time 8    Period Weeks    Status New    Target Date 03/31/21              Plan - 02/16/21 1549     Clinical Impression Statement See tx note. R visual neglect and visual attention were noted to be impaired with cues to look at entire page which impact reading. SLP completed further assessment of auditory & reading comprehension and cognition. Goals were updated. SLP rec skilled services to address cognition and aphasia to maximize functional communication and decrease instances of communication breakdowns. Cont with current POC.    Speech Therapy Frequency 2x / week    Duration 8 weeks    Treatment/Interventions Language facilitation;Environmental controls;Cueing hierarchy;SLP instruction and feedback;Compensatory techniques;Functional tasks;Compensatory strategies;Internal/external aids;Multimodal communcation approach;Patient/family education    Potential to Achieve Goals Good    Consulted and Agree with Plan of Care Patient;Family member/caregiver    Family Member Consulted Wife             Patient will benefit from skilled therapeutic intervention in order to improve the following deficits and impairments:   Aphasia  Cognitive communication deficit    Problem List Patient Active Problem List    Diagnosis Date Noted   Intraparenchymal hemorrhage of brain ([place]) 01/17/2021   Hemorrhagic stroke ([place]) 01/11/2021   ICH (intracerebral hemorrhage) ([place]) 01/11/2021   Cellulitis of left hand excluding fingers and thumb 12/09/2018   Osteoporosis without current pathological fracture 06/19/2018   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   Allergic asthma, moderate persistent, uncomplicated 80/99/8338  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[place]) 02/08/2016   Cardiomyopathy ([place]) 01/22/2016   Multiple lung nodules on CT 12/14/2015   Nonspecific abnormal electrocardiogram (ECG) (EKG) 12/14/2015   Hepatic cirrhosis ([place]) 09/24/2015   Coronary artery disease involving native coronary artery of native heart without angina pectoris 09/24/2015   Hyperglycemia 06/08/2015   Screen for colon cancer 07/08/2014   Allergy to statin medication 10/23/2012   BPH associated with nocturia 04/18/2012   Routine general medical examination at a health care facility 04/18/2012   Hyperlipidemia with target LDL less than 70 04/27/2010   Essential hypertension 04/27/2010   NASAL POLYP 11/06/2009   RHINOSINUSITIS, CHRONIC 02/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

## 2021-02-16 NOTE — Therapy (Signed)
[facility] [address] [place], [place], [address] Phone: [phone]   Fax:  [phone]  Occupational Therapy Treatment  Patient Details  Name: Lee Blevins MRN: [id number] Date of Birth: July 09, 1945 Referring Provider (OT): [place] f/u [name]   Encounter Date: 02/16/2021   OT End of Session - 02/16/21 1619     Visit Number 2    Number of Visits 17    Date for OT Re-Evaluation 03/30/21    Authorization Type Medicare A & B/[employer]    Authorization Time Period Follow Medicare Guidelines    Authorization - Visit Number 1    Progress Note Due on Visit 10    OT Start Time 1618    OT Stop Time 1657    OT Time Calculation (min) 39 min    Activity Tolerance Patient tolerated treatment well    Behavior During Therapy [facility] for tasks assessed/performed             Past Medical History:  Diagnosis Date   Asthma    Chronic sinusitis    History of CVA (cerebrovascular accident) 2011   hemorrhagic   HTN (hypertension)    Hyperlipidemia    Nasal polyps    Pneumonia     Past Surgical History:  Procedure Laterality Date   NASAL POLYP EXCISION     x2   TONSILLECTOMY      There were no vitals filed for this visit.   Subjective Assessment - 02/16/21 1619     Subjective  Pt denies any pain. Pt denies any changes since evaluaion - reported diff with vision and reading to ST    Pertinent History HTN, Parietal Parenchymal ICH (2011), HLD, Lung Nodules, Hepatic Cirrhosis, BPH, Chronic Hypoxic Respiratory Failure    Limitations Fall Risk, decreased safety awareness, R visual field cut    Patient Stated Goals "make sure everything works correctly"    Currently in Pain? No/denies    Pain Score 0-No pain             Sorting cards with 100% accuracy  Environmental Scanning with 15/16 accuracy = 94% on first pass. Pt required no additional cues for locating remaining items on second pass.  Small Pegboard (blue,  green, yellow triangles) with RUE with copying abstract pattern with 75% accuracy and min/mod cues and increased time. Did not complete. Pt unable to identify triangles in pictures and from pattern.                   OT Education - 02/16/21 1649     Education Details Issued visual scanning activities to do at home    Person(s) Educated Patient;Spouse    Methods Explanation;Handout    Comprehension Verbalized understanding              OT Short Term Goals - 02/16/21 1635       OT SHORT TERM GOAL #1   Title Pt will be independent with HEP    Time 4    Period Weeks    Status New    Target Date 03/02/21      OT SHORT TERM GOAL #2   Title Pt will perform environmental scanning with 90% accuracy or greater    Time 4    Period Weeks    Status Achieved      OT SHORT TERM GOAL #3   Title Pt will verbalize understanding of visual strategies for increasing independence and safey with ADLs and IADLs  Time 4    Period Weeks    Status On-going      OT SHORT TERM GOAL #4   Title Pt will increase coordination in RUE to completing 9 hole peg test in 45 seconds or less    Baseline R 49.15s, L 30.88s    Time 4    Period Weeks    Status On-going               OT Long Term Goals - 02/02/21 1409       OT LONG TERM GOAL #1   Title Pt will be independent with any updated HEPs    Time 8    Period Weeks    Status New    Target Date 03/30/21      OT LONG TERM GOAL #2   Title Pt will perform environmental scanning with cognitive component or physical/cognitive task simultaneously with 90% accuracy or greater.    Time 8    Period Weeks    Status New      OT LONG TERM GOAL #3   Title Pt will improve 9 hole peg test with RUE in 40 seconds or less in order to increase fine motor coordination.    Baseline R 49.15s, L 30.88s    Time 8    Period Weeks    Status New      OT LONG TERM GOAL #4   Title Pt will demonstrate ability to complete Trail Making Test B  with 80% accuracy or greater    Baseline eliminated numbers and only did letters at eval    Time 8    Period Weeks    Status New                   Plan - 02/16/21 1702     Clinical Impression Statement Pt demonstrated good environmental scanning today and progress towards goals. Continue to work towards unmet goals. Pt and spouse verbalized understanding of goals. Continue to assess visual perceptual skills.    OT Occupational Profile and History Problem Focused Assessment - Including review of records relating to presenting problem    Occupational performance deficits (Please refer to evaluation for details): ADL's;IADL's;Leisure    Body Structure / Function / Physical Skills ADL;Decreased knowledge of use of DME;Dexterity;Flexibility;FMC;Vision;Endurance;IADL;ROM;UE functional use    Cognitive Skills Attention;Problem Solve;Safety Awareness;Sequencing    Rehab Potential Good    Clinical Decision Making Limited treatment options, no task modification necessary    Comorbidities Affecting Occupational Performance: None    Modification or Assistance to Complete Evaluation  No modification of tasks or assist necessary to complete eval    OT Frequency 2x / week    OT Duration 8 weeks    OT Treatment/Interventions Patient/family education;Neuromuscular education;Functional Mobility Training;Visual/perceptual remediation/compensation;Cognitive remediation/compensation;Therapeutic exercise;Therapeutic activities;DME and/or AE instruction;Self-care/ADL training    Plan coordination HEP RUE,  assess visual perceptual skills further maybe MVPT or puzzle    Consulted and Agree with Plan of Care Patient;Family member/caregiver    Family Member Consulted spouse, [name]             Patient will benefit from skilled therapeutic intervention in order to improve the following deficits and impairments:   Body Structure / Function / Physical Skills: ADL, Decreased knowledge of use of DME,  Dexterity, Flexibility, FMC, Vision, Endurance, IADL, ROM, UE functional use Cognitive Skills: Attention, Problem Solve, Safety Awareness, Sequencing     Visit Diagnosis: Visuospatial deficit  Attention and concentration deficit  Unsteadiness on  feet  Muscle weakness (generalized)  Other lack of coordination  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)  Frontal lobe and executive function deficit    Problem List Patient Active Problem List   Diagnosis Date Noted   Intraparenchymal hemorrhage of brain ([place]) 01/17/2021   Hemorrhagic stroke ([place]) 01/11/2021   ICH (intracerebral hemorrhage) ([place]) 01/11/2021   Cellulitis of left hand excluding fingers and thumb 12/09/2018   Osteoporosis without current pathological fracture 06/19/2018   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   Allergic asthma, moderate persistent, uncomplicated 16/55/3748   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   Cardiomyopathy ([place]) 01/22/2016   Multiple lung nodules on CT 12/14/2015   Nonspecific abnormal electrocardiogram (ECG) (EKG) 12/14/2015   Hepatic cirrhosis ([place]) 09/24/2015   Coronary artery disease involving native coronary artery of native heart without angina pectoris 09/24/2015   Hyperglycemia 06/08/2015   Screen for colon cancer 07/08/2014   Allergy to statin medication 10/23/2012   BPH associated with nocturia 04/18/2012   Routine general medical examination at a health care facility 04/18/2012   Hyperlipidemia with target LDL less than 70 04/27/2010   Essential hypertension 04/27/2010   NASAL POLYP 11/06/2009   RHINOSINUSITIS, CHRONIC 02/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

## 2021-02-17 ENCOUNTER — Encounter: Payer: Self-pay | Admitting: Physical Therapy

## 2021-02-17 ENCOUNTER — Other Ambulatory Visit: Payer: Self-pay | Admitting: Internal Medicine

## 2021-02-17 ENCOUNTER — Ambulatory Visit: Payer: Medicare Other | Admitting: Physical Therapy

## 2021-02-17 ENCOUNTER — Ambulatory Visit: Payer: Medicare Other | Admitting: Occupational Therapy

## 2021-02-17 DIAGNOSIS — M6281 Muscle weakness (generalized): Secondary | ICD-10-CM

## 2021-02-17 DIAGNOSIS — R293 Abnormal posture: Secondary | ICD-10-CM

## 2021-02-17 DIAGNOSIS — R2681 Unsteadiness on feet: Secondary | ICD-10-CM

## 2021-02-17 DIAGNOSIS — R278 Other lack of coordination: Secondary | ICD-10-CM

## 2021-02-17 DIAGNOSIS — R41840 Attention and concentration deficit: Secondary | ICD-10-CM

## 2021-02-17 DIAGNOSIS — R4701 Aphasia: Secondary | ICD-10-CM | POA: Diagnosis not present

## 2021-02-17 DIAGNOSIS — R41844 Frontal lobe and executive function deficit: Secondary | ICD-10-CM

## 2021-02-17 DIAGNOSIS — R41842 Visuospatial deficit: Secondary | ICD-10-CM

## 2021-02-17 DIAGNOSIS — I69251 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: Secondary | ICD-10-CM

## 2021-02-17 NOTE — Therapy (Signed)
[facility] [address] [place], [place], [address] Phone: [phone]   Fax:  [phone]  Physical Therapy Treatment  Patient Details  Name: Lee Blevins MRN: [id number] Date of Birth: 10-26-1945 Referring Provider (PT): [place]e [place], [place]   Encounter Date: 02/17/2021   PT End of Session - 02/17/21 0851     Visit Number 2    Number of Visits 9    Date for PT Re-Evaluation 03/04/21    Authorization Type Medicare, [place] (10th visit progress note)    Progress Note Due on Visit 10    PT Start Time 0850    PT Stop Time 0929    PT Time Calculation (min) 39 min    Equipment Utilized During Treatment Gait belt    Activity Tolerance Patient tolerated treatment well    Behavior During Therapy [facility] for tasks assessed/performed             Past Medical History:  Diagnosis Date   Asthma    Chronic sinusitis    History of CVA (cerebrovascular accident) 2011   hemorrhagic   HTN (hypertension)    Hyperlipidemia    Nasal polyps    Pneumonia     Past Surgical History:  Procedure Laterality Date   NASAL POLYP EXCISION     x2   TONSILLECTOMY      There were no vitals filed for this visit.   Subjective Assessment - 02/17/21 0850     Subjective No new complaints. No falls or pain to report.    Patient is accompained by: Family member   spouse [name]   Pertinent History hemorrhagic CVA, HTN, HLD, asthma    Limitations Walking;Standing    How long can you stand comfortably? 30 mins    How long can you walk comfortably? 30 mins    Patient Stated Goals "To get back to as normal as I can"    Currently in Pain? No/denies    Pain Score 0-No pain                    [facility] Adult PT Treatment/Exercise - 02/17/21 0900       Transfers   Transfers Sit to Stand;Stand to Sit    Sit to Stand 6: Modified independent (Device/Increase time)    Stand to Sit 6: Modified independent (Device/Increase time)       Ambulation/Gait   Ambulation/Gait Yes    Ambulation/Gait Assistance 6: Modified independent (Device/Increase time);5: Supervision    Ambulation Distance (Feet) --   around clinic with session   Assistive device None    Gait Pattern Step-through pattern;Trunk flexed    Ambulation Surface Level;Indoor      Neuro Re-ed    Neuro Re-ed Details  issued HEP for balance. Refer to [facility] program for full details. Min guard assist for safety with cues on form/technique.                 Balance Exercises - 02/17/21 0914       Balance Exercises: Standing   Rockerboard Anterior/posterior;Lateral;EO;EC;30 seconds;Other reps (comment);Intermittent UE support;Limitations    Rockerboard Limitations performed both ways on balance board with occasional touch to chair/wall for balance assitance: rocking board with emphasis on tall posture with EO, progressing to EC with min guard to min assist; then holding the board steady for EC 30 sec's x 3 reps with min guard to min assist for balance.    Sit to Stand Standard surface;Without upper extremity support;Foam/compliant  surface;Limitations            Issued to HEP this session:  Access Code: [email or web address] URL: [email or web address] Date: 02/17/2021 Prepared by: Willow Ora  Exercises Tandem Walking with Counter Support - 1 x daily - 5 x weekly - 1 sets - 3 reps Toe Walking with Counter Support - 1 x daily - 5 x weekly - 1 sets - 3 reps Heel Walking with Counter Support - 1 x daily - 5 x weekly - 1 sets - 3-4 reps Standing Near Stance in Corner with Eyes Closed - 1 x daily - 5 x weekly - 1 sets - 3 reps - 30 hold Standing Balance in Corner with Eyes Closed - 1 x daily - 5 x weekly - 1 sets - 10 reps   PT Education - 02/17/21 0914     Education Details HEP issued today to address balance    Person(s) Educated Patient;Spouse    Methods Explanation;Demonstration;Verbal cues;Handout    Comprehension Verbalized  understanding;Returned demonstration;Verbal cues required;Need further instruction              PT Short Term Goals - 02/02/21 1324       PT SHORT TERM GOAL #1   Title =LTGS               PT Long Term Goals - 02/02/21 1324       PT LONG TERM GOAL #1   Title Pt will be IND with final HEP in order to indicate improved functional mobility and balance (Target Date: 03/04/21)    Time 4    Period Weeks    Status New    Target Date 03/04/21      PT LONG TERM GOAL #2   Title Pt will improve FGA to >/=28/30 in order to indicate dec fall risk    Time 4    Period Weeks    Status New      PT LONG TERM GOAL #3   Title Pt will negotiate up/down 4 steps in reciprocal pattern without rail at independent level in order to indicate improved mobility in home/community.    Time 4    Period Weeks    Status New      PT LONG TERM GOAL #4   Title Pt will ambulate 1000' over varying outdoor surfaces while scanning environment at independent level in order to indicate safety at home and in community.    Time 4    Period Weeks    Status New                   Plan - 02/17/21 0851     Clinical Impression Statement Today's skilled session initially focused on estabishment of an HEP to address balance with no issues reported or noted in session. Remainder of session continued to focus on balance training on compliant surfaces with and without vision with assistance needed. No issues noted or reported in session. The pt is making progress toward goals and should benefit from continued PT to progress toward unmet goals.    Personal Factors and Comorbidities Comorbidity 3+;Age    Comorbidities see above    Examination-Activity Limitations Locomotion Level;Lift;Transfers;Stand;Stairs;Squat    Examination-Participation Restrictions Driving;Community Activity    Stability/Clinical Decision Making Evolving/Moderate complexity    Rehab Potential Good    PT Frequency 2x / week    PT  Duration 4 weeks    PT Treatment/Interventions ADLs/Self Care Home Management;Gait training;Stair training;Functional mobility training;Therapeutic activities;Therapeutic exercise;Balance training;Neuromuscular re-education;Cognitive  remediation;Patient/family education;Vestibular;Visual/perceptual remediation/compensation;Passive range of motion    PT Next Visit Plan continue to address high level balance, work on outdoor gait, floor transfer, compliant surfaces, EO/EC, narrow BOS    PT Home Exercise Plan Access Code: [email or web address]    Consulted and Agree with Plan of Care Patient;Family member/caregiver    Family Member Consulted wife             Patient will benefit from skilled therapeutic intervention in order to improve the following deficits and impairments:  Decreased activity tolerance, Decreased balance, Decreased cognition, Decreased coordination, Decreased endurance, Postural dysfunction, Impaired vision/preception, Decreased strength  Visit Diagnosis: Unsteadiness on feet  Muscle weakness (generalized)  Abnormal posture     Problem List Patient Active Problem List   Diagnosis Date Noted   Intraparenchymal hemorrhage of brain ([place]) 01/17/2021   Hemorrhagic stroke ([place]) 01/11/2021   ICH (intracerebral hemorrhage) ([place]) 01/11/2021   Cellulitis of left hand excluding fingers and thumb 12/09/2018   Osteoporosis without current pathological fracture 06/19/2018   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   Allergic asthma, moderate persistent, uncomplicated 61/51/8343   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   Cardiomyopathy ([place]) 01/22/2016   Multiple lung nodules on CT 12/14/2015   Nonspecific abnormal electrocardiogram (ECG) (EKG) 12/14/2015   Hepatic cirrhosis ([place]) 09/24/2015   Coronary artery disease involving native coronary artery of native heart without angina pectoris 09/24/2015   Hyperglycemia 06/08/2015   Screen for colon cancer 07/08/2014    Allergy to statin medication 10/23/2012   BPH associated with nocturia 04/18/2012   Routine general medical examination at a health care facility 04/18/2012   Hyperlipidemia with target LDL less than 70 04/27/2010   Essential hypertension 04/27/2010   NASAL POLYP 11/06/2009   RHINOSINUSITIS, 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

## 2021-02-17 NOTE — Therapy (Addendum)
[facility] [address] [place], [place], [address] Phone: [phone]   Fax:  [phone]  Occupational Therapy Treatment  Patient Details  Name: Lee Blevins MRN: [id number] Date of Birth: [birth year] Referring Provider (OT): [place] f/u [name]   Encounter Date: 02/17/2021   OT End of Session - 02/17/21 0807     Visit Number 3    Number of Visits 17    Date for OT Re-Evaluation 03/30/21    Authorization Type Medicare A & B/[employer]    Authorization Time Period Follow Medicare Guidelines    Authorization - Visit Number 3    Progress Note Due on Visit 10    OT Start Time 0804    OT Stop Time 0845    OT Time Calculation (min) 41 min    Behavior During Therapy [facility] for tasks assessed/performed             Past Medical History:  Diagnosis Date   Asthma    Chronic sinusitis    History of CVA (cerebrovascular accident) 2011   hemorrhagic   HTN (hypertension)    Hyperlipidemia    Nasal polyps    Pneumonia     Past Surgical History:  Procedure Laterality Date   NASAL POLYP EXCISION     x2   TONSILLECTOMY      There were no vitals filed for this visit.   Pain:Pt denies pain  Treatment:Matching analog with digital times for tabletop scanning, increased time and mod v.c for scanning strategies red paper placed at edge of table,  Increased time required for this activity.                      OT Education - 02/17/21 0907     Education Details Issued visual compensation strategies, and reveiwed with pt/ wife, coordination HEP    Person(s) Educated Patient;Spouse    Methods Explanation;Handout;Demonstration;Verbal cues    Comprehension Verbalized understanding;Returned demonstration;Verbal cues required              OT Short Term Goals - 02/16/21 1635       OT SHORT TERM GOAL #1   Title Pt will be independent with HEP    Time 4    Period Weeks    Status New    Target Date  03/02/21      OT SHORT TERM GOAL #2   Title Pt will perform environmental scanning with 90% accuracy or greater    Time 4    Period Weeks    Status Achieved      OT SHORT TERM GOAL #3   Title Pt will verbalize understanding of visual strategies for increasing independence and safey with ADLs and IADLs    Time 4    Period Weeks    Status On-going      OT SHORT TERM GOAL #4   Title Pt will increase coordination in RUE to completing 9 hole peg test in 45 seconds or less    Baseline R 49.15s, L 30.88s    Time 4    Period Weeks    Status On-going               OT Long Term Goals - 02/02/21 1409       OT LONG TERM GOAL #1   Title Pt will be independent with any updated HEPs    Time 8    Period Weeks    Status New    Target Date  03/30/21      OT LONG TERM GOAL #2   Title Pt will perform environmental scanning with cognitive component or physical/cognitive task simultaneously with 90% accuracy or greater.    Time 8    Period Weeks    Status New      OT LONG TERM GOAL #3   Title Pt will improve 9 hole peg test with RUE in 40 seconds or less in order to increase fine motor coordination.    Baseline R 49.15s, L 30.88s    Time 8    Period Weeks    Status New      OT LONG TERM GOAL #4   Title Pt will demonstrate ability to complete Trail Making Test B with 80% accuracy or greater    Baseline eliminated numbers and only did letters at eval    Time [place] - 02/17/21 8280     Clinical Impression Statement Pt is progressing towards goals. Pt/ wife verbalize understanding of visual compensation strategies.    OT Occupational Profile and History Problem Focused Assessment - Including review of records relating to presenting problem    Occupational performance deficits (Please refer to evaluation for details): ADL's;IADL's;Leisure    Body Structure / Function / Physical Skills ADL;Decreased knowledge of use of  DME;Dexterity;Flexibility;FMC;Vision;Endurance;IADL;ROM;UE functional use    Cognitive Skills Attention;Problem Solve;Safety Awareness;Sequencing    Rehab Potential Good    Clinical Decision Making Limited treatment options, no task modification necessary    Comorbidities Affecting Occupational Performance: None    Modification or Assistance to Complete Evaluation  No modification of tasks or assist necessary to complete eval    OT Frequency 2x / week    OT Duration 8 weeks    OT Treatment/Interventions Patient/family education;Neuromuscular education;Functional Mobility Training;Visual/perceptual remediation/compensation;Cognitive remediation/compensation;Therapeutic exercise;Therapeutic activities;DME and/or AE instruction;Self-care/ADL training    Plan assess visual perceptual skills further maybe MVPT or puzzle    Consulted and Agree with Plan of Care Patient;Family member/caregiver    Family Member Consulted spouse, [name]             Patient will benefit from skilled therapeutic intervention in order to improve the following deficits and impairments:   Body Structure / Function / Physical Skills: ADL, Decreased knowledge of use of DME, Dexterity, Flexibility, FMC, Vision, Endurance, IADL, ROM, UE functional use Cognitive Skills: Attention, Problem Solve, Safety Awareness, Sequencing     Visit Diagnosis: Visuospatial deficit  Attention and concentration deficit  Unsteadiness on feet  Muscle weakness (generalized)  Other lack of coordination  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)  Frontal lobe and executive function deficit    Problem List Patient Active Problem List   Diagnosis Date Noted   Intraparenchymal hemorrhage of brain ([place]) 01/17/2021   Hemorrhagic stroke ([place]) 01/11/2021   ICH (intracerebral hemorrhage) ([place]) 01/11/2021   Cellulitis of left hand excluding fingers and thumb  12/09/2018   Osteoporosis without current pathological fracture 06/19/2018   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   Allergic asthma, moderate persistent, uncomplicated 03/49/1791   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   Cardiomyopathy ([place]) 01/22/2016   Multiple lung nodules on CT 12/14/2015   Nonspecific abnormal electrocardiogram (ECG) (EKG) 12/14/2015   Hepatic cirrhosis ([place]) 09/24/2015   Coronary artery disease involving native coronary artery of native heart without angina pectoris  09/24/2015   Hyperglycemia 06/08/2015   Screen for colon cancer 07/08/2014   Allergy to statin medication 10/23/2012   BPH associated with nocturia 04/18/2012   Routine general medical examination at a health care facility 04/18/2012   Hyperlipidemia with target LDL less than 70 04/27/2010   Essential hypertension 04/27/2010   NASAL POLYP 11/06/2009   RHINOSINUSITIS, 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 [place], [place], [address] Phone: [phone]   Fax:  [phone]  Name: Lee Blevins MRN: [id number] Date of Birth: 1945-06-19

## 2021-02-17 NOTE — Patient Instructions (Signed)
Access Code: [email or web address] URL: [email or web address] Date: 02/17/2021 Prepared by: Willow Ora  Exercises Tandem Walking with Counter Support - 1 x daily - 5 x weekly - 1 sets - 3 reps Toe Walking with Counter Support - 1 x daily - 5 x weekly - 1 sets - 3 reps Heel Walking with Counter Support - 1 x daily - 5 x weekly - 1 sets - 3-4 reps Standing Near Stance in Corner with Eyes Closed - 1 x daily - 5 x weekly - 1 sets - 3 reps - 30 hold Standing Balance in Corner with Eyes Closed - 1 x daily - 5 x weekly - 1 sets - 10 reps

## 2021-02-17 NOTE — Patient Instructions (Addendum)
     Coordination Activities  Perform the following activities for 20 minutes 1 times per day with right hand(s).   Rotate ball in fingertips (clockwise and counter-clockwise). Toss ball between hands. Flip cards 1 at a time as fast as you can. Deal cards with your thumb (Hold deck in hand and push card off top with thumb). Pick up coins and place in container or coin bank. Pick up coins and stack. Pick up coins one at a time until you get 5-10 in your hand, then move coins from palm to fingertips  one at a time to place in container    Rotate 2 golf balls in your hand                     Visual compensation strategies 1. Look for the edge of objects (to the left and/or right) so that you make sure you are seeing all of an object 2. Turn your head when walking, scan from side to side, particularly in busy environments, looking particularly to the right side 3. Use an organized scanning pattern. It's usually easier to scan from top to bottom, and left to right (like you are reading) 4. Double check yourself 5. Use a line guide (like a blank piece of paper) or your finger when reading 6. If necessary, place brightly colored tape at end of table or work area as a reminder to always look until you see the tape.

## 2021-02-18 ENCOUNTER — Other Ambulatory Visit: Payer: Self-pay

## 2021-02-18 ENCOUNTER — Ambulatory Visit: Payer: Medicare Other | Admitting: Physical Therapy

## 2021-02-18 ENCOUNTER — Encounter: Payer: Self-pay | Admitting: Physical Therapy

## 2021-02-18 DIAGNOSIS — R293 Abnormal posture: Secondary | ICD-10-CM

## 2021-02-18 DIAGNOSIS — R4701 Aphasia: Secondary | ICD-10-CM | POA: Diagnosis not present

## 2021-02-18 DIAGNOSIS — R2681 Unsteadiness on feet: Secondary | ICD-10-CM

## 2021-02-18 DIAGNOSIS — M6281 Muscle weakness (generalized): Secondary | ICD-10-CM

## 2021-02-18 NOTE — Therapy (Signed)
[facility] [address] [place], [place], [address] Phone: [phone]   Fax:  [phone]  Physical Therapy Treatment  Patient Details  Name: Lee Blevins MRN: [id number] Date of Birth: [birth year] Referring Provider (PT): [place], [place]   Encounter Date: 02/18/2021   PT End of Session - 02/18/21 0953     Visit Number 3    Number of Visits 9    Date for PT Re-Evaluation 03/04/21    Authorization Type Medicare, [place] (10th visit progress note)    Progress Note Due on Visit 10    PT Start Time 0803    PT Stop Time 0842    PT Time Calculation (min) 39 min    Equipment Utilized During Treatment Gait belt    Activity Tolerance Patient tolerated treatment well    Behavior During Therapy [facility] for tasks assessed/performed             Past Medical History:  Diagnosis Date   Asthma    Chronic sinusitis    History of CVA (cerebrovascular accident) 2011   hemorrhagic   HTN (hypertension)    Hyperlipidemia    Nasal polyps    Pneumonia     Past Surgical History:  Procedure Laterality Date   NASAL POLYP EXCISION     x2   TONSILLECTOMY      There were no vitals filed for this visit.   Subjective Assessment - 02/18/21 0806     Subjective No new complaints,  falls, or pain to report. Pt reports that he is ready for therapy.    Patient is accompained by: Family member   [name]   Pertinent History hemorrhagic CVA, HTN, HLD, asthma    Limitations Walking;Standing    How long can you stand comfortably? 30 mins    How long can you walk comfortably? 30 mins    Patient Stated Goals "To get back to as normal as I can"    Currently in Pain? No/denies                     [facility] Adult PT Treatment/Exercise - 02/18/21 0807       Transfers   Transfers Sit to Stand;Stand to Sit    Sit to Stand 6: Modified independent (Device/Increase time)    Stand to Sit 6: Modified independent (Device/Increase  time)      Ambulation/Gait   Ambulation/Gait Yes    Ambulation/Gait Assistance 6: Modified independent (Device/Increase time);5: Supervision    Ambulation/Gait Assistance Details modified I ambulating in/out of clinic and during session. Supervision for navigating around/over obsticles and on compliant surfaces.    Assistive device None    Gait Pattern Step-through pattern;Trunk flexed    Ambulation Surface Level;Unlevel      High Level Balance   High Level Balance Activities --    High Level Balance Comments --                 Balance Exercises - 02/18/21 0807       Balance Exercises: Standing   Standing Eyes Closed Narrow base of support (BOS);Foam/compliant surface;3 reps;30 secs;Limitations    Standing Eyes Closed Limitations Pt stood on airex in // bars with narrow BOS, no UE support, and EC for 3 reps of 30 sec. The pt demonstrated minimal postural sway, but was able to maintain balance, requiring min guard.    Tandem Stance Foam/compliant surface;Eyes open;2 reps;30 secs;Limitations   Pt performed standing with modified tandem  stance on airex in // bars with each LE forward. Pt performed 2 sets of 30 seconds with EO, no UE support, and each LE forward. The pt required min guard and demonstrated minimal postural sway with EO.   Sidestepping Foam/compliant support;Other reps (comment);Limitations    Sidestepping Limitations Pt performed side stepping and alternating tapping cones near counter for safety on red and blue mats for 10', 3 times each direction. The pt demonstrated decreased foot clearence and frequent knocking over of cones. Pt required cues to slow down and min guard    Step Over Hurdles / Cones Pt performed stepping over hurdles and navigating around cones on varying surfaces (solid surface, red and blue mats) near counter. The pt performed 3 laps down and back. The pt required min guard to safely navigate around/over objects, and demonstrated no LOB. The pt did  demonstrate increased steppage and decreased turning in both directions around objects, and occaisionally stepping over objects rather than stepping around.    Other Standing Exercises Pt positioned on airex with LEs shoulder width apart in the parallel bars. Standing on airex, the pt performed alternating taps on cones with two finger UE support from 1 UE. The pt performed 2 sets of 10 alternating taps forward, and 1 set of 10 alternating taps diagonal. Pt demonstrated minimal postural sway and occaisinal decreased foot clearence bu knocking over cones.                  PT Short Term Goals - 02/02/21 1324       PT SHORT TERM GOAL #1   Title =LTGS               PT Long Term Goals - 02/02/21 1324       PT LONG TERM GOAL #1   Title Pt will be IND with final HEP in order to indicate improved functional mobility and balance (Target Date: 03/04/21)    Time 4    Period Weeks    Status New    Target Date 03/04/21      PT LONG TERM GOAL #2   Title Pt will improve FGA to >/=28/30 in order to indicate dec fall risk    Time 4    Period Weeks    Status New      PT LONG TERM GOAL #3   Title Pt will negotiate up/down 4 steps in reciprocal pattern without rail at independent level in order to indicate improved mobility in home/community.    Time 4    Period Weeks    Status New      PT LONG TERM GOAL #4   Title Pt will ambulate 1000' over varying outdoor surfaces while scanning environment at independent level in order to indicate safety at home and in community.    Time 4    Period Weeks    Status New                   Plan - 02/18/21 0955     Clinical Impression Statement Today's skilled session was focused on static and dynamic balance on compliant surfaces. The pt is making progress towards balance goals and could benefit from further skilled PT to address finctional limitations.    Personal Factors and Comorbidities Comorbidity 3+;Age    Comorbidities see  above    Examination-Activity Limitations Locomotion Level;Lift;Transfers;Stand;Stairs;Squat    Examination-Participation Restrictions Driving;Community Activity    Stability/Clinical Decision Making Evolving/Moderate complexity    Rehab Potential Good  PT Frequency 2x / week    PT Duration 4 weeks    PT Treatment/Interventions ADLs/Self Care Home Management;Gait training;Stair training;Functional mobility training;Therapeutic activities;Therapeutic exercise;Balance training;Neuromuscular re-education;Cognitive remediation;Patient/family education;Vestibular;Visual/perceptual remediation/compensation;Passive range of motion    PT Next Visit Plan continue to address high level balance, work on outdoor gait, floor transfer, compliant surfaces, EO/EC, narrow BOS, and scanning for/navigating around objects.    PT Home Exercise Plan Access Code: [email or web address]    Consulted and Agree with Plan of Care Patient;Family member/caregiver    Family Member Consulted wife             Patient will benefit from skilled therapeutic intervention in order to improve the following deficits and impairments:  Decreased activity tolerance, Decreased balance, Decreased cognition, Decreased coordination, Decreased endurance, Postural dysfunction, Impaired vision/preception, Decreased strength  Visit Diagnosis: Unsteadiness on feet  Muscle weakness (generalized)  Abnormal posture     Problem List Patient Active Problem List   Diagnosis Date Noted   Intraparenchymal hemorrhage of brain ([place]) 01/17/2021   Hemorrhagic stroke ([place]) 01/11/2021   ICH (intracerebral hemorrhage) ([place]) 01/11/2021   Cellulitis of left hand excluding fingers and thumb 12/09/2018   Osteoporosis without current pathological fracture 06/19/2018   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   Allergic asthma, moderate persistent, uncomplicated 76/72/0947   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   Cardiomyopathy ([place])  01/22/2016   Multiple lung nodules on CT 12/14/2015   Nonspecific abnormal electrocardiogram (ECG) (EKG) 12/14/2015   Hepatic cirrhosis ([place]) 09/24/2015   Coronary artery disease involving native coronary artery of native heart without angina pectoris 09/24/2015   Hyperglycemia 06/08/2015   Screen for colon cancer 07/08/2014   Allergy to statin medication 10/23/2012   BPH associated with nocturia 04/18/2012   Routine general medical examination at a health care facility 04/18/2012   Hyperlipidemia with target LDL less than 70 04/27/2010   Essential hypertension 04/27/2010   NASAL POLYP 11/06/2009   RHINOSINUSITIS, 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 [place], [place], [address] Phone: [phone]   Fax:  [phone]  Name: Lee Blevins MRN: [id number] Date of Birth: 09-24-1945

## 2021-02-21 ENCOUNTER — Ambulatory Visit (INDEPENDENT_AMBULATORY_CARE_PROVIDER_SITE_OTHER): Payer: Medicare Other | Admitting: Adult Health

## 2021-02-21 ENCOUNTER — Encounter: Payer: Self-pay | Admitting: Adult Health

## 2021-02-21 VITALS — BP 148/77 | HR 83 | Ht 70.5 in | Wt 169.0 lb

## 2021-02-21 DIAGNOSIS — I619 Nontraumatic intracerebral hemorrhage, unspecified: Secondary | ICD-10-CM

## 2021-02-21 DIAGNOSIS — I639 Cerebral infarction, unspecified: Secondary | ICD-10-CM

## 2021-02-21 DIAGNOSIS — E785 Hyperlipidemia, unspecified: Secondary | ICD-10-CM | POA: Diagnosis not present

## 2021-02-21 DIAGNOSIS — I10 Essential (primary) hypertension: Secondary | ICD-10-CM

## 2021-02-21 NOTE — Patient Instructions (Addendum)
Continue working with therapies for hopeful ongoing improvement  Would recommend holding off on driving at this time until further improvement of cognition - additional information for driving rehab locations provided below  We will call you in regards to repeat imaging and if aspirin or cholesterol medication is needed     Follow up in 4 months or call earlier if needed

## 2021-02-21 NOTE — Progress Notes (Signed)
[name] [address] [place]. [place] [address] [phone]       HOSPITAL FOLLOW UP NOTE  [name] Date of Birth:  05-29-1945 Medical Record Number:  [id number]   Reason for Referral:  hospital stroke follow up    SUBJECTIVE:   CHIEF COMPLAINT:  Chief Complaint  Patient presents with   Brain Hemorrhage    Rm 2 Hospital FU  wife- Lee Blevins  "still in PT, OT, ST, no specific concerns"    HPI:   75 year old male with history of hypertension, hyperlipidemia, alcohol use, left parietal ICH in 03/2010 admitted on 01/11/2021 for confusion, aphasia and vision changes.  Personally reviewed hospitalization pertinent progress notes, lab work and imaging.  CT showed left occipital ICH.  CTA head and neck unremarkable.  CT repeat showed stable hematoma.  MRI showed left occipital ICH and chronic left parietal ICH, and acute left CR small infarct.  EF 55 to 60%, A1c 5.2, LDL 130.  B12 194, UDS negative.  Creatinine 0.85.  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 BP good control with minimal BP meds therefore low suspicion for hypertensive etiology.  Left CR small infarct likely secondary to small vessel disease.  Antiplatelet/anticoagulation not recommended during admission in setting of current ICH. BP goal less than 140. Hx of statin intolerance -Zetia 10 mg daily started. Hx of tobacco use (quit 40 years ago) and current EtOH use (3 glasses of wine per night). PT/OT/SLP eval recommended CIR   Today, 02/21/2021, Lee Blevins is being seen for initial hospital follow-up accompanied by his wife, Lee Blevins.  Overall stable from stroke standpoint since discharge.  Denies new stroke/TIA symptoms Reports residual mild imbalance but overall improving currently working with PT. ambulates without assistive device. Continued right sided visual loss but has been gradually improving - seen by ophthalmology today with noted  improvement of visual loss since initial onset.  Has follow-up visit scheduled 12/8.  Currently working with OT for visual spatial deficit.  Currently working with SLP for cognitive impairment and aphasia. Recent SLUMS 14/30 (deficits per SLP note: "difficulty with orientation (unable to tell year), working and short term memory, attention, executive functioning and cognitive flexibility"). Per wife and patient, no prior cognitive concerns although she does not some issues with attention PTA but she believes this is more due to age. He does request return back to driving.  He has been slowly returning back to prior activities such as light yard work without difficulty.  Blood pressure today 148/77. Blood pressure monitored at home which has been stable typically 120-130s/70s.  He is not currently on Zetia as patient elected to d/c prior to CIR discharge.  He has not yet had follow-up with PCP  No further concerns at this time     PERTINENT IMAGING  Code Stroke CT head  Area of acute hemorrhage within the left occipital lobe measuring up to 3.7 cm. Atrophy, chronic small vessel disease Repeat CT head Unchanged appearance of left occipital intraparenchymal hematoma with mild surrounding edema. Unchanged appearance of multiple old infarcts and chronic microvascular ischemia. CTA head & neck  Left occipital hematoma unchanged in size. Small adjacent subarachnoid hemorrhage unchanged.No underlying vascular malformation on CTA No significant carotid or vertebral artery stenosis in the neck MRI  Unchanged size of left occipital intraparenchymal hematoma allowing for differences in modality. Small area of acute ischemia in the left centrum semiovale. Chronic microvascular ischemia and volume loss.  2D Echo  EF 55-60%, Left ventricular diastolic parameters are consistent  with Grade I diastolic dysfunction, No thrombus, wall motion abnormality or shunt found.       ROS:   14 system  review of systems performed and negative with exception of those listed in HPI  PMH:  Past Medical History:  Diagnosis Date   Asthma    Chronic sinusitis    History of CVA (cerebrovascular accident) 2011   hemorrhagic   HTN (hypertension)    Hyperlipidemia    Nasal polyps    Pneumonia     PSH:  Past Surgical History:  Procedure Laterality Date   NASAL POLYP EXCISION     x2   TONSILLECTOMY      Social History:  Social History   Socioeconomic History   Marital status: Married    Spouse name: Orthoptist   Number of children: Not on file   Years of education: Not on file   Highest education level: Bachelor's degree (e.g., BA, AB, BS)  Occupational History   Occupation: RETIRED    Employer: RETIRED    CommentEducational psychologist  Tobacco Use   Smoking status: Former    Packs/day: 1.00    Years: 1.00    Pack years: 1.00    Types: Cigarettes    Quit date: 04/17/1973    Years since quitting: 47.8   Smokeless tobacco: Never   Tobacco comments:    1975  Vaping Use   Vaping Use: Never used  Substance and Sexual Activity   Alcohol use: Yes    Alcohol/week: 15.0 standard drinks    Types: 15 Glasses of wine per week    Comment: 02/21/21 7 a week   Drug use: No   Sexual activity: Not Currently  Other Topics Concern   Not on file  Social History Narrative   02/21/21 lives with wife   Regular Exercise -  YES   Social Determinants of Health   Financial Resource Strain: Not on file  Food Insecurity: Not on file  Transportation Needs: Not on file  Physical Activity: Not on file  Stress: Not on file  Social Connections: Not on file  Intimate Partner Violence: Not on file    Family History:  Family History  Problem Relation Age of Onset   Heart disease Father    Valvular heart disease Father    Neurologic Disorder Mother 66       Similar to MS   Cancer Neg Hx    Early death Neg Hx    Hearing loss Neg Hx    Hyperlipidemia Neg Hx    Hypertension Neg Hx    Stroke Neg Hx     Kidney disease Neg Hx     Medications:   Current Outpatient Medications on File Prior to Visit  Medication Sig Dispense Refill   albuterol (VENTOLIN HFA) 108 (90 Base) MCG/ACT inhaler TAKE 2 PUFFS BY MOUTH EVERY 6 HOURS AS NEEDED FOR WHEEZE OR SHORTNESS OF BREATH 8.5 each 8   fluticasone-salmeterol (ADVAIR DISKUS) 250-50 MCG/ACT AEPB INHALE 1 INHALATION TWICE DAILY *RINSE MOUTH AFTERWARDS* 180 each 3   irbesartan (AVAPRO) 150 MG tablet Take 1 tablet (150 mg total) by mouth daily. 30 tablet 0   montelukast (SINGULAIR) 10 MG tablet TAKE 1 TABLET BY MOUTH EVERYDAY AT BEDTIME 90 tablet 2   predniSONE (DELTASONE) 5 MG tablet Take 1 tablet (5 mg total) by mouth daily. 30 tablet 0   No current facility-administered medications on file prior to visit.  Allergies:   Allergies  Allergen Reactions   Amlodipine     edema   Crestor [Rosuvastatin]     Muscle aches   Livalo [Pitavastatin]     Muscle aches   Bystolic [Nebivolol Hcl]     dizziness      OBJECTIVE:  Physical Exam  Vitals:   02/21/21 1454  BP: (!) 148/77  Pulse: 83  Weight: 169 lb (76.7 kg)  Height: 5' 10.5" (1.791 m)   Body mass index is 23.91 kg/m. No results found.  Post stroke PHQ 2/9 Depression screen PHQ 2/9 02/08/2021  Decreased Interest 0  Down, Depressed, Hopeless 0  PHQ - 2 Score 0  Altered sleeping 0  Tired, decreased energy 0  Change in appetite 0  Feeling bad or failure about yourself  0  Trouble concentrating 0  Moving slowly or fidgety/restless 0  Suicidal thoughts 0  PHQ-9 Score 0  Difficult doing work/chores Not difficult at all     General: well developed, well nourished, very pleasant elderly Caucasian male, seated, in no evident distress Head: head normocephalic and atraumatic.   Neck: supple with no carotid or supraclavicular bruits Cardiovascular: regular rate and rhythm, no murmurs Musculoskeletal: no deformity Skin:  no rash/petichiae Vascular:  Normal pulses all  extremities   Neurologic Exam Mental Status: Awake and fully alert.  Occasional speech hesitancy although able to participate in conversation and name objects without difficulty.  Oriented to place and time. Recent memory mildly impaired with the and remote memory intact. Attention span, concentration and fund of knowledge mildly impaired with wife providing some history. Mood and affect appropriate. Further memory testing not completed due to time constraints Cranial Nerves: Fundoscopic exam reveals sharp disc margins. Pupils equal, briskly reactive to light. Extraocular movements full without nystagmus. Visual fields right homonymous lower quadrantanopia. Hearing intact. Facial sensation intact. Face, tongue, palate moves normally and symmetrically.  Motor: Normal bulk and tone. Normal strength in all tested extremity muscles Sensory.: intact to touch , pinprick , position and vibratory sensation.  Coordination: Rapid alternating movements normal in all extremities. Finger-to-nose and heel-to-shin performed accurately bilaterally. Gait and Station: Arises from chair without difficulty. Stance is normal. Gait demonstrates normal stride length and balance without use of AD. Tandem walk and heel toe unable to perform.  Reflexes: 1+ and symmetric. Toes downgoing.     NIHSS  2 Modified Rankin  2-3      ASSESSMENT: Lee Blevins is a 75 y.o. 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 Vascular risk factors include HTN, HLD, advanced age, former tobacco use, hx of [place] 2011, and current EtOH use.      PLAN:  Left occipital ICH L centrum semiovale stroke Hx of left parietal ICH:  Residual deficit: Cognitive impairment, gait impairment and right homonymous lower quadrantanopia.   Encouraged continued participation with outpatient therapies and routine follow-up with ophthalmology.  Due to cognitive difficulties, would recommend holding off on driving at this time until hopeful further cognitive recovery.  Additional information regarding driving rehab provided to patient and wife. Will further discuss with Dr. Leonie Man regarding indication for repeat imaging with CTH vs MR brain and possible need of restart aspirin with risk vs benefit in setting of recurrent ICH, possible CAA and ischemic stroke. Will also discuss use of Zetia or  cholesterol lowering medication in setting of SVD stroke as wife was told he should not be on cholesterol lowering medication Discussed secondary stroke prevention measures and importance of close PCP follow up for aggressive stroke risk factor management. I have gone over the pathophysiology of stroke, warning signs and symptoms, risk factors and their management in some detail with instructions to go to the closest emergency room for symptoms of concern. HTN: BP goal <130/90.  Stable on current regimen per PCP HLD: LDL goal <70. Recent LDL 130.  See above regarding cholesterol lowering medication     Follow up in 4 months or call earlier if needed   CC:  [facility] provider: Dr. Leonie Man PCP: Lee Lima, MD    I spent 69 minutes of face-to-face and non-face-to-face time with patient and wife.  This included previsit chart review including review of recent hospitalization, lab review, study review, order entry, electronic health record documentation, patient and wife education and extensive discussion regarding recent hemorrhagic and ischemic stroke including likely etiology, secondary stroke prevention measures and importance of managing stroke risk factors, residual deficits and typical recovery time and answered all other questions to patient and wife's satisfaction   Frann Rider, AGNP-BC  [facility] [address] [place], [place] [address]  Phone [phone] Fax [phone] Note: This document was prepared with digital dictation and possible smart phrase technology. Any transcriptional errors that result from this process are unintentional.

## 2021-02-23 ENCOUNTER — Ambulatory Visit: Payer: Medicare Other | Admitting: Occupational Therapy

## 2021-02-23 ENCOUNTER — Other Ambulatory Visit: Payer: Self-pay

## 2021-02-23 ENCOUNTER — Ambulatory Visit: Payer: Medicare Other | Admitting: Speech Pathology

## 2021-02-23 ENCOUNTER — Encounter: Payer: Self-pay | Admitting: Rehabilitation

## 2021-02-23 ENCOUNTER — Ambulatory Visit: Payer: Medicare Other | Admitting: Rehabilitation

## 2021-02-23 DIAGNOSIS — R293 Abnormal posture: Secondary | ICD-10-CM

## 2021-02-23 DIAGNOSIS — R41844 Frontal lobe and executive function deficit: Secondary | ICD-10-CM

## 2021-02-23 DIAGNOSIS — M6281 Muscle weakness (generalized): Secondary | ICD-10-CM

## 2021-02-23 DIAGNOSIS — R4701 Aphasia: Secondary | ICD-10-CM | POA: Diagnosis not present

## 2021-02-23 DIAGNOSIS — R41840 Attention and concentration deficit: Secondary | ICD-10-CM

## 2021-02-23 DIAGNOSIS — R41842 Visuospatial deficit: Secondary | ICD-10-CM

## 2021-02-23 DIAGNOSIS — R41841 Cognitive communication deficit: Secondary | ICD-10-CM

## 2021-02-23 DIAGNOSIS — R2681 Unsteadiness on feet: Secondary | ICD-10-CM

## 2021-02-23 DIAGNOSIS — R278 Other lack of coordination: Secondary | ICD-10-CM

## 2021-02-23 NOTE — Therapy (Signed)
[facility] [address] [place], [place], [address] Phone: [phone]   Fax:  [phone]  Speech Language Pathology Treatment  Patient Details  Name: Lee Blevins MRN: [id number] Date of Birth: 31-Jul-1945 Referring Provider (SLP): Cathlyn Parsons PA-C   Encounter Date: 02/23/2021   End of Session - 02/23/21 1144     Visit Number 3    Number of Visits 17    Date for SLP Re-Evaluation 04/05/21    SLP Start Time 0930    SLP Stop Time  5102    SLP Time Calculation (min) 45 min    Activity Tolerance Patient tolerated treatment well             Past Medical History:  Diagnosis Date   Asthma    Chronic sinusitis    History of CVA (cerebrovascular accident) 2011   hemorrhagic   HTN (hypertension)    Hyperlipidemia    Nasal polyps    Pneumonia     Past Surgical History:  Procedure Laterality Date   NASAL POLYP EXCISION     x2   TONSILLECTOMY      There were no vitals filed for this visit.   Subjective Assessment - 02/23/21 0932     Subjective Pt reports things are going well.    Currently in Pain? No/denies                   ADULT SLP TREATMENT - 02/23/21 1137       General Information   Behavior/Cognition Alert;Cooperative;Pleasant mood      Treatment Provided   Treatment provided Cognitive-Linquistic      Cognitive-Linquistic Treatment   Treatment focused on Cognition    Skilled Treatment SLP trained patient on use and implementation of attention skills this session. Wife reported pt commonly gets frustrated with his level of functioning while working on a home project. When asked, pt reported he felt he "should be able to complete it faster". SLP and pt discussed "brain breaks" and lists as strategies to assist with efficiency during home projects. Pt reported he sometimes underestimates the time it takes to complete a task. Wife and pt reported they felt this session was very  helpful and look forward to trying strategies at home. SLP to try task completion/estimation exercise next session and begin memory strategies.      Assessment / Recommendations / Plan   Plan Continue with current plan of care      Progression Toward Goals   Progression toward goals Progressing toward goals                SLP Short Term Goals - 02/16/21 1551       SLP SHORT TERM GOAL #1   Title Complete further assessment of expressive language and receptive language.    Baseline Expression: occasional paraphasias in conversation noted (BNT for baseline?); Receptive language: assess yes/no questions, following directions, reading comprehension at sentence/paragraph level *adjust goals as needed    Time 2    Period Weeks    Status Achieved    Target Date 02/17/21      SLP SHORT TERM GOAL #2   Title Patient will read and follow multiple- step directions to increase working memory/ comprehension of written information with 80% accuracy given minA across 3 sessions.    Time 4    Period Weeks    Status Revised    Target Date 03/06/21      SLP SHORT TERM GOAL #  3   Title Pt wife/caregiver will identify 3+ strategies to aid pt's memory/comprehension of caregiver provided verbal information independently across 3 sessions.    Time 4    Period Weeks    Status Deferred    Target Date 03/06/21      SLP SHORT TERM GOAL #4   Title Pt will recall and demonstrate 3 memory strategies to increase recall of important information at home independently across 2 sessions.    Time 4    Period Weeks    Status New    Target Date 03/06/21      SLP SHORT TERM GOAL #5   Title Pt will recall and demonstrate 3 attention strategies to increase recall of important information at home independently across 2 sessions.    Time 4    Period Weeks    Status New    Target Date 03/06/21              SLP Long Term Goals - 02/16/21 1703       SLP LONG TERM GOAL #1   Title Patient will read and  follow multiple- step directions to increase comprehension of written information with 80% accuracy  across 3 sessions.    Time 8    Period Weeks    Status New    Target Date 03/31/21      SLP LONG TERM GOAL #2   Title Pt wife/caregiver will report use of strategies to aid pt's comprehension of caregiver provided verbal information independently across 3 sessions.    Time 8    Period Weeks    Status New    Target Date 03/31/21      SLP LONG TERM GOAL #3   Title Pt will report use of 1+ attention strategies to increase recall of important information at home independently.    Time 8    Period Weeks    Status New    Target Date 03/31/21      SLP LONG TERM GOAL #4   Title Pt will report use 1+ memory strategies to increase recall of important information at home independently across 2 sessions.    Time 8    Period Weeks    Status New    Target Date 03/31/21              Plan - 02/23/21 1145     Clinical Impression Statement See tx note. Pt and wife reported the attention strategies would be beneficial to reduce cognitive and physical fatigue and improve energy conservation. They found "brain breaks" and "to-do lists" most helpful. SLP rec skilled services to address cognition and aphasia to maximize functional communication and decrease instances of communication breakdowns. Cont with current POC.    Speech Therapy Frequency 2x / week    Duration 8 weeks    Treatment/Interventions Language facilitation;Environmental controls;Cueing hierarchy;SLP instruction and feedback;Compensatory techniques;Functional tasks;Compensatory strategies;Internal/external aids;Multimodal communcation approach;Patient/family education    Potential to Achieve Goals Good    Consulted and Agree with Plan of Care Patient;Family member/caregiver    Family Member Consulted Wife             Patient will benefit from skilled therapeutic intervention in order to improve the following deficits and  impairments:   Aphasia  Cognitive communication deficit    Problem List Patient Active Problem List   Diagnosis Date Noted   Intraparenchymal hemorrhage of brain ([place]) 01/17/2021   Hemorrhagic stroke ([place]) 01/11/2021   ICH (intracerebral hemorrhage) ([place]) 01/11/2021   Cellulitis  of left hand excluding fingers and thumb 12/09/2018   Osteoporosis without current pathological fracture 06/19/2018   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   Allergic asthma, moderate persistent, uncomplicated 33/03/5086   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   Cardiomyopathy ([place]) 01/22/2016   Multiple lung nodules on CT 12/14/2015   Nonspecific abnormal electrocardiogram (ECG) (EKG) 12/14/2015   Hepatic cirrhosis ([place]) 09/24/2015   Coronary artery disease involving native coronary artery of native heart without angina pectoris 09/24/2015   Hyperglycemia 06/08/2015   Screen for colon cancer 07/08/2014   Allergy to statin medication 10/23/2012   BPH associated with nocturia 04/18/2012   Routine general medical examination at a health care facility 04/18/2012   Hyperlipidemia with target LDL less than 70 04/27/2010   Essential hypertension 04/27/2010   NASAL POLYP 11/06/2009   RHINOSINUSITIS, 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

## 2021-02-23 NOTE — Therapy (Signed)
[employer] [address] [place], [place], [address] Phone: [phone]   Fax:  [phone]  Occupational Therapy Treatment  Patient Details  Name: Lee Blevins MRN: [id number] Date of Birth: 1945-05-07 Referring Provider (OT): [place] f/u [name]   Encounter Date: 02/23/2021   OT End of Session - 02/23/21 0810     Visit Number 4    Number of Visits 17    Date for OT Re-Evaluation 03/30/21    Authorization Type Medicare A & B/[employer]    Authorization Time Period Follow Medicare Guidelines    Authorization - Visit Number 4    Progress Note Due on Visit 10    OT Start Time 0802    OT Stop Time [phone]    OT Time Calculation (min) 40 min    Activity Tolerance Patient tolerated treatment well    Behavior During Therapy [facility] for tasks assessed/performed             Past Medical History:  Diagnosis Date   Asthma    Chronic sinusitis    History of CVA (cerebrovascular accident) 2011   hemorrhagic   HTN (hypertension)    Hyperlipidemia    Nasal polyps    Pneumonia     Past Surgical History:  Procedure Laterality Date   NASAL POLYP EXCISION     x2   TONSILLECTOMY      There were no vitals filed for this visit.      Treatment: Pt denies pain Number cancellation then counting how many times the double numbers repeat, only 1 error, min v.c initally and use of line guide and margin markings, increased time required. Completing a 12 piece puzzle for increased problem solving and visual perceptual skills, min v.cancer to initiate task and correctly place corners 107M word search, min -mod v.c to for performance and use of line guide initally                      OT Short Term Goals - 02/16/21 1635       OT SHORT TERM GOAL #1   Title Pt will be independent with HEP    Time 4    Period Weeks    Status New    Target Date 03/02/21      OT SHORT TERM GOAL #2   Title Pt will perform  environmental scanning with 90% accuracy or greater    Time 4    Period Weeks    Status Achieved      OT SHORT TERM GOAL #3   Title Pt will verbalize understanding of visual strategies for increasing independence and safey with ADLs and IADLs    Time 4    Period Weeks    Status On-going      OT SHORT TERM GOAL #4   Title Pt will increase coordination in RUE to completing 9 hole peg test in 45 seconds or less    Baseline R 49.15s, L 30.88s    Time 4    Period Weeks    Status On-going               OT Long Term Goals - 02/02/21 1409       OT LONG TERM GOAL #1   Title Pt will be independent with any updated HEPs    Time 8    Period Weeks    Status New    Target Date 03/30/21      OT LONG TERM  GOAL #2   Title Pt will perform environmental scanning with cognitive component or physical/cognitive task simultaneously with 90% accuracy or greater.    Time 8    Period Weeks    Status New      OT LONG TERM GOAL #3   Title Pt will improve 9 hole peg test with RUE in 40 seconds or less in order to increase fine motor coordination.    Baseline R 49.15s, L 30.88s    Time 8    Period Weeks    Status New      OT LONG TERM GOAL #4   Title Pt will demonstrate ability to complete Trail Making Test B with 80% accuracy or greater    Baseline eliminated numbers and only did letters at eval    Time [place] - 02/23/21 0810     Clinical Impression Statement Pt is progressing towards goals. Pt is using line guide for scanning activities and pt/ wife feel it is beneficial.    OT Occupational Profile and History Problem Focused Assessment - Including review of records relating to presenting problem    Occupational performance deficits (Please refer to evaluation for details): ADL's;IADL's;Leisure    Body Structure / Function / Physical Skills ADL;Decreased knowledge of use of  DME;Dexterity;Flexibility;FMC;Vision;Endurance;IADL;ROM;UE functional use    Cognitive Skills Attention;Problem Solve;Safety Awareness;Sequencing    Rehab Potential Good    Clinical Decision Making Limited treatment options, no task modification necessary    Comorbidities Affecting Occupational Performance: None    Modification or Assistance to Complete Evaluation  No modification of tasks or assist necessary to complete eval    OT Frequency 2x / week    OT Duration 8 weeks    OT Treatment/Interventions Patient/family education;Neuromuscular education;Functional Mobility Training;Visual/perceptual remediation/compensation;Cognitive remediation/compensation;Therapeutic exercise;Therapeutic activities;DME and/or AE instruction;Self-care/ADL training    Plan continue to work towards goals, environmental scanning, cognitve/ visual perceptual skills    Consulted and Agree with Plan of Care Patient;Family member/caregiver    Family Member Consulted spouse, [name]             Patient will benefit from skilled therapeutic intervention in order to improve the following deficits and impairments:   Body Structure / Function / Physical Skills: ADL, Decreased knowledge of use of DME, Dexterity, Flexibility, FMC, Vision, Endurance, IADL, ROM, UE functional use Cognitive Skills: Attention, Problem Solve, Safety Awareness, Sequencing     Visit Diagnosis: Muscle weakness (generalized)  Visuospatial deficit  Attention and concentration deficit  Other lack of coordination  Frontal lobe and executive function deficit    Problem List Patient Active Problem List   Diagnosis Date Noted   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   Hemorrhagic stroke ([place]) 01/11/2021   ICH (intracerebral hemorrhage) ([place]) 01/11/2021   Cellulitis of left hand excluding fingers and thumb 12/09/2018   Osteoporosis without current pathological fracture 06/19/2018   Steroid-induced osteoporosis  03/15/2016   Allergic asthma, moderate persistent, uncomplicated 14/97/0263   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[place]) 02/08/2016   Cardiomyopathy ([place]) 01/22/2016   Multiple lung nodules on CT 12/14/2015   Nonspecific abnormal electrocardiogram (ECG) (EKG) 12/14/2015   Hepatic cirrhosis ([place]) 09/24/2015   Coronary artery disease involving native coronary artery of native heart without angina pectoris 09/24/2015   Hyperglycemia 06/08/2015   Screen for colon cancer 07/08/2014   Allergy to statin medication  10/23/2012   BPH associated with nocturia 04/18/2012   Routine general medical examination at a health care facility 04/18/2012   Hyperlipidemia with target LDL less than 70 04/27/2010   Essential hypertension 04/27/2010   NASAL POLYP 11/06/2009   RHINOSINUSITIS, CHRONIC 02/29/2008    Tashe Purdon, OT/L 02/23/2021, 8:42 AM  [facility] [address] [place], [place], [address] Phone: [phone]   Fax:  [phone]  Name: Tallan Sandoz MRN: [id number] Date of Birth: Oct 06, 1945

## 2021-02-23 NOTE — Therapy (Signed)
[facility] [address] [place], [place], [address] Phone: [phone]   Fax:  [phone]  Physical Therapy Treatment  Patient Details  Name: Lee Blevins MRN: [id number] Date of Birth: 1945-04-27 Referring Provider (PT): [place], [place]   Encounter Date: 02/23/2021   PT End of Session - 02/23/21 1338     Visit Number 4    Number of Visits 9    Date for PT Re-Evaluation 03/04/21    Authorization Type Medicare, [place] (10th visit progress note)    Progress Note Due on Visit 10    PT Start Time 0848    PT Stop Time 0930    PT Time Calculation (min) 42 min    Equipment Utilized During Treatment Gait belt    Activity Tolerance Patient tolerated treatment well    Behavior During Therapy WFL for tasks assessed/performed             Past Medical History:  Diagnosis Date   Asthma    Chronic sinusitis    History of CVA (cerebrovascular accident) 2011   hemorrhagic   HTN (hypertension)    Hyperlipidemia    Nasal polyps    Pneumonia     Past Surgical History:  Procedure Laterality Date   NASAL POLYP EXCISION     x2   TONSILLECTOMY      There were no vitals filed for this visit.   Subjective Assessment - 02/23/21 0854     Subjective Pt reports no changes since last session.    Pertinent History hemorrhagic CVA, HTN, HLD, asthma    Patient Stated Goals "To get back to as normal as I can"    Currently in Pain? No/denies                               [facility] Adult PT Treatment/Exercise - 02/23/21 0903       Transfers   Transfers Sit to Stand;Stand to Sit    Sit to Stand 6: Modified independent (Device/Increase time)    Stand to Sit 6: Modified independent (Device/Increase time)      Ambulation/Gait   Ambulation/Gait Yes    Ambulation/Gait Assistance 6: Modified independent (Device/Increase time);5: Supervision    Ambulation/Gait Assistance Details in/out of clinic and around  clinic during session    Ambulation Distance (Feet) 200 Feet    Assistive device None    Gait Pattern Step-through pattern;Trunk flexed    Ambulation Surface Level;Indoor      Neuro Re-ed    Neuro Re-ed Details  High level balance in // bars:  Standing on foam balance beam with feet shoulder width apart EO holding balance x 20 secs>alt UE flex x 10 reps>head motion side/side and up/down x 10 reps each.  Standing on ramp (facing downward slope) marching in place x 20 reps>semi tandem stance with EC x 2 sets of 20 secs each side. Wall bumps on solid ground with feet approx 7" away from wall x 10 reps>standing on foam beam with feet shoulder width apart (same distance from wall) x 5 reps with 5 sec hold in upright position then feet together x 5 reps with 5 sec hold.                       PT Short Term Goals - 02/02/21 1324       PT SHORT TERM GOAL #1   Title =LTGS  PT Long Term Goals - 02/02/21 1324       PT LONG TERM GOAL #1   Title Pt will be IND with final HEP in order to indicate improved functional mobility and balance (Target Date: 03/04/21)    Time 4    Period Weeks    Status New    Target Date 03/04/21      PT LONG TERM GOAL #2   Title Pt will improve FGA to >/=28/30 in order to indicate dec fall risk    Time 4    Period Weeks    Status New      PT LONG TERM GOAL #3   Title Pt will negotiate up/down 4 steps in reciprocal pattern without rail at independent level in order to indicate improved mobility in home/community.    Time 4    Period Weeks    Status New      PT LONG TERM GOAL #4   Title Pt will ambulate 1000' over varying outdoor surfaces while scanning environment at independent level in order to indicate safety at home and in community.    Time 4    Period Weeks    Status New                   Plan - 02/23/21 1338     Clinical Impression Statement Skilled session focused on high level balance with emphasis on  improving ankle and hip strategy as he tends to keep hips posterior to COG causing posterior LOB when on more compliant surfaces.  Pt did well.  Discussed adding visits through November to address deficits.  Pt and wife verbalized understanding.    Examination-Activity Limitations Locomotion Level;Lift;Transfers;Stand;Stairs;Squat    Examination-Participation Restrictions Driving;Community Activity    Stability/Clinical Decision Making Evolving/Moderate complexity    Clinical Decision Making Moderate    Rehab Potential Good    PT Frequency 2x / week    PT Duration 4 weeks    PT Treatment/Interventions ADLs/Self Care Home Management;Gait training;Stair training;Functional mobility training;Therapeutic activities;Therapeutic exercise;Balance training;Neuromuscular re-education;Cognitive remediation;Patient/family education;Vestibular;Visual/perceptual remediation/compensation;Passive range of motion    PT Next Visit Plan Exercises going ok? Continue to update/challenge as able, continue to address high level balance, work on outdoor gait, floor transfer, compliant surfaces, EO/EC, narrow BOS, and scanning for/navigating around objects.    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   Hemorrhagic stroke ([place]) 01/11/2021   ICH (intracerebral hemorrhage) ([place]) 01/11/2021   Cellulitis of left hand excluding fingers and thumb 12/09/2018    Osteoporosis without current pathological fracture 06/19/2018   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   Allergic asthma, moderate persistent, uncomplicated 38/25/0539   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   Cardiomyopathy ([place]) 01/22/2016   Multiple lung nodules on CT 12/14/2015   Nonspecific abnormal electrocardiogram (ECG) (EKG) 12/14/2015   Hepatic cirrhosis ([place]) 09/24/2015   Coronary artery disease involving native coronary artery of native heart without angina pectoris 09/24/2015   Hyperglycemia 06/08/2015   Screen for colon cancer 07/08/2014   Allergy  to statin medication 10/23/2012   BPH associated with nocturia 04/18/2012   Routine general medical examination at a health care facility 04/18/2012   Hyperlipidemia with target LDL less than 70 04/27/2010   Essential hypertension 04/27/2010   NASAL POLYP 11/06/2009   RHINOSINUSITIS, CHRONIC 02/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 [place], [place], [address] Phone: [phone]   Fax:  [phone] 02/23/21, 1:42 PM   Name: Lee Blevins MRN: [id number] Date of Birth: 24-Jun-1945

## 2021-02-23 NOTE — Progress Notes (Signed)
I agree with the above plan 

## 2021-02-25 ENCOUNTER — Other Ambulatory Visit: Payer: Self-pay

## 2021-02-25 ENCOUNTER — Ambulatory Visit: Payer: Medicare Other | Admitting: Physical Therapy

## 2021-02-25 ENCOUNTER — Encounter: Payer: Self-pay | Admitting: Occupational Therapy

## 2021-02-25 ENCOUNTER — Encounter: Payer: Self-pay | Admitting: Physical Therapy

## 2021-02-25 ENCOUNTER — Ambulatory Visit: Payer: Medicare Other | Admitting: Occupational Therapy

## 2021-02-25 DIAGNOSIS — R2681 Unsteadiness on feet: Secondary | ICD-10-CM

## 2021-02-25 DIAGNOSIS — R41844 Frontal lobe and executive function deficit: Secondary | ICD-10-CM

## 2021-02-25 DIAGNOSIS — R4701 Aphasia: Secondary | ICD-10-CM | POA: Diagnosis not present

## 2021-02-25 DIAGNOSIS — R41842 Visuospatial deficit: Secondary | ICD-10-CM

## 2021-02-25 DIAGNOSIS — R293 Abnormal posture: Secondary | ICD-10-CM

## 2021-02-25 DIAGNOSIS — M6281 Muscle weakness (generalized): Secondary | ICD-10-CM

## 2021-02-25 DIAGNOSIS — I69251 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: Secondary | ICD-10-CM

## 2021-02-25 DIAGNOSIS — R41840 Attention and concentration deficit: Secondary | ICD-10-CM

## 2021-02-25 DIAGNOSIS — R278 Other lack of coordination: Secondary | ICD-10-CM

## 2021-02-25 NOTE — Therapy (Signed)
[facility] [address] [place], [place], [address] Phone: [phone]   Fax:  [phone]  Occupational Therapy Treatment  Patient Details  Name: Lee Blevins MRN: [id number] Date of Birth: 07/28/1945 Referring Provider (OT): [place] f/u [name]   Encounter Date: 02/25/2021   OT End of Session - 02/25/21 0936     Visit Number 5    Number of Visits 17    Date for OT Re-Evaluation 03/30/21    Authorization Type Medicare A & B/[employer]    Authorization Time Period Follow Medicare Guidelines    Authorization - Visit Number 5    Progress Note Due on Visit 10    OT Start Time [phone]    OT Stop Time 1015    OT Time Calculation (min) 41 min    Activity Tolerance Patient tolerated treatment well    Behavior During Therapy [facility] for tasks assessed/performed             Past Medical History:  Diagnosis Date   Asthma    Chronic sinusitis    History of CVA (cerebrovascular accident) 2011   hemorrhagic   HTN (hypertension)    Hyperlipidemia    Nasal polyps    Pneumonia     Past Surgical History:  Procedure Laterality Date   NASAL POLYP EXCISION     x2   TONSILLECTOMY      There were no vitals filed for this visit.   Subjective Assessment - 02/25/21 0936     Subjective  "it is a little wet and nasty out there"    Pertinent History HTN, Parietal Parenchymal ICH (2011), HLD, Lung Nodules, Hepatic Cirrhosis, BPH, Chronic Hypoxic Respiratory Failure    Limitations Fall Risk, decreased safety awareness, R visual field cut    Patient Stated Goals "make sure everything works correctly"    Currently in Pain? No/denies    Pain Score 0-No pain              Grooved Pegs with RUE with good coordination with min difficulty.  PVC Pipe Tree req'd mod/max cues for identifying correct pieces with use of key and following pater. Fig 6  Constant Therapy Same Symbol level 5 with 91% accuracy and 67.16s response  time.                     OT Short Term Goals - 02/16/21 1635       OT SHORT TERM GOAL #1   Title Pt will be independent with HEP    Time 4    Period Weeks    Status New    Target Date 03/02/21      OT SHORT TERM GOAL #2   Title Pt will perform environmental scanning with 90% accuracy or greater    Time 4    Period Weeks    Status Achieved      OT SHORT TERM GOAL #3   Title Pt will verbalize understanding of visual strategies for increasing independence and safey with ADLs and IADLs    Time 4    Period Weeks    Status On-going      OT SHORT TERM GOAL #4   Title Pt will increase coordination in RUE to completing 9 hole peg test in 45 seconds or less    Baseline R 49.15s, L 30.88s    Time 4    Period Weeks    Status On-going  OT Long Term Goals - 02/02/21 1409       OT LONG TERM GOAL #1   Title Pt will be independent with any updated HEPs    Time 8    Period Weeks    Status New    Target Date 03/30/21      OT LONG TERM GOAL #2   Title Pt will perform environmental scanning with cognitive component or physical/cognitive task simultaneously with 90% accuracy or greater.    Time 8    Period Weeks    Status New      OT LONG TERM GOAL #3   Title Pt will improve 9 hole peg test with RUE in 40 seconds or less in order to increase fine motor coordination.    Baseline R 49.15s, L 30.88s    Time 8    Period Weeks    Status New      OT LONG TERM GOAL #4   Title Pt will demonstrate ability to complete Trail Making Test B with 80% accuracy or greater    Baseline eliminated numbers and only did letters at eval    Time 8    Period Weeks    Status New                   Plan - 02/25/21 1050     Clinical Impression Statement Pt continues to make progress. Pt with good visual scanning today and use of strategies. Pt with difficulty with cognitive aspect of tasks but overall making progress.    OT Occupational Profile and  History Problem Focused Assessment - Including review of records relating to presenting problem    Occupational performance deficits (Please refer to evaluation for details): ADL's;IADL's;Leisure    Body Structure / Function / Physical Skills ADL;Decreased knowledge of use of DME;Dexterity;Flexibility;FMC;Vision;Endurance;IADL;ROM;UE functional use    Cognitive Skills Attention;Problem Solve;Safety Awareness;Sequencing    Rehab Potential Good    Clinical Decision Making Limited treatment options, no task modification necessary    Comorbidities Affecting Occupational Performance: None    Modification or Assistance to Complete Evaluation  No modification of tasks or assist necessary to complete eval    OT Frequency 2x / week    OT Duration 8 weeks    OT Treatment/Interventions Patient/family education;Neuromuscular education;Functional Mobility Training;Visual/perceptual remediation/compensation;Cognitive remediation/compensation;Therapeutic exercise;Therapeutic activities;DME and/or AE instruction;Self-care/ADL training    Plan continue to work towards goals, environmental scanning, cognitve/ visual perceptual skills    Consulted and Agree with Plan of Care Patient;Family member/caregiver    Family Member Consulted spouse, [name]             Patient will benefit from skilled therapeutic intervention in order to improve the following deficits and impairments:   Body Structure / Function / Physical Skills: ADL, Decreased knowledge of use of DME, Dexterity, Flexibility, FMC, Vision, Endurance, IADL, ROM, UE functional use Cognitive Skills: Attention, Problem Solve, Safety Awareness, Sequencing     Visit Diagnosis: Muscle weakness (generalized)  Unsteadiness on feet  Attention and concentration deficit  Other lack of coordination  Frontal lobe and executive function deficit  Visuospatial deficit  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 [facility])    Problem List Patient Active Problem List   Diagnosis Date Noted   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   Hemorrhagic stroke ([place]) 01/11/2021   ICH (intracerebral hemorrhage) ([place]) 01/11/2021   Cellulitis of left hand excluding fingers and thumb 12/09/2018   Osteoporosis without current pathological  fracture 06/19/2018   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   Allergic asthma, moderate persistent, uncomplicated 18/34/3735   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   Cardiomyopathy ([place]) 01/22/2016   Multiple lung nodules on CT 12/14/2015   Nonspecific abnormal electrocardiogram (ECG) (EKG) 12/14/2015   Hepatic cirrhosis ([place]) 09/24/2015   Coronary artery disease involving native coronary artery of native heart without angina pectoris 09/24/2015   Hyperglycemia 06/08/2015   Screen for colon cancer 07/08/2014   Allergy to statin medication 10/23/2012   BPH associated with nocturia 04/18/2012   Routine general medical examination at a health care facility 04/18/2012   Hyperlipidemia with target LDL less than 70 04/27/2010   Essential hypertension 04/27/2010   NASAL POLYP 11/06/2009   RHINOSINUSITIS, CHRONIC 02/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

## 2021-02-28 NOTE — Therapy (Signed)
[facility] [address] [place], [place], [address] Phone: [phone]   Fax:  [phone]  Physical Therapy Treatment  Patient Details  Name: Lee Blevins MRN: [id number] Date of Birth: 02/16/1946 Referring Provider (PT): Mar[place]e [place], [place]   Encounter Date: 02/25/2021    02/25/21 1019  PT Visits / Re-Eval  Visit Number 5  Number of Visits 9  Date for PT Re-Evaluation 03/04/21  Authorization  Authorization Type Medicare, [place] (10th visit progress note)  Progress Note Due on Visit 10  PT Time Calculation  PT Start Time 1018  PT Stop Time 1100  PT Time Calculation (min) 42 min  PT - End of Session  Equipment Utilized During Treatment Gait belt  Activity Tolerance Patient tolerated treatment well  Behavior During Therapy WFL for tasks assessed/performed    Past Medical History:  Diagnosis Date   Asthma    Chronic sinusitis    History of CVA (cerebrovascular accident) 2011   hemorrhagic   HTN (hypertension)    Hyperlipidemia    Nasal polyps    Pneumonia     Past Surgical History:  Procedure Laterality Date   NASAL POLYP EXCISION     x2   TONSILLECTOMY      There were no vitals filed for this visit.     02/25/21 1018  Symptoms/Limitations  Subjective No new complaints. No falls or pain to report.  Patient is accompained by: Family member (spouse [name])  Pertinent History hemorrhagic CVA, HTN, HLD, asthma  Limitations Walking;Standing  How long can you stand comfortably? 30 mins  How long can you walk comfortably? 30 mins  Patient Stated Goals "To get back to as normal as I can"  Pain Assessment  Currently in Pain? No/denies  Pain Score 0       02/25/21 1020  Transfers  Transfers Sit to Stand;Stand to Sit  Sit to Stand 6: Modified independent (Device/Increase time)  Stand to Sit 6: Modified independent (Device/Increase time)  Ambulation/Gait  Ambulation/Gait Yes  Ambulation/Gait  Assistance 6: Modified independent (Device/Increase time);5: Supervision  Ambulation/Gait Assistance Details around clinic with session  Assistive device None  Gait Pattern Step-through pattern;Trunk flexed  Ambulation Surface Level;Indoor  High Level Balance  High Level Balance Activities Marching forwards;Marching backwards;Tandem walking (tandem/toe/heel walking forward/backward)  High Level Balance Comments red/blue mats next to counter: 3 laps each/each way with occasional support on counter for balance, min guard assist. Cues on posture, form and weight shifting.  Neuro Re-ed   Neuro Re-ed Details  for balance/muscle re-ed/coordination: gait in ~50 foot hallway with min guard assist for safety, minor veering noted at times with head movements.Pt performed head movements left<>forward<>right, then up<>forward<>down for ~2 laps each.       02/25/21 1040  Balance Exercises: Standing  Standing Eyes Closed Narrow base of support (BOS);Wide (BOA);Head turns;Foam/compliant surface;Other reps (comment);30 secs;Limitations  Standing Eyes Closed Limitations on airex with no UE support: feet together for EC 30 sec's x 3 reps, progressing to feet hip width apart for EC head movements left<>right, up<>down for ~10 reps each. min guard to min assist with cues on posture/weight shifting for balance assitance.  SLS with Vectors Foam/compliant surface;Other reps (comment);Limitations  SLS with Vectors Limitations 6 cones along edges of both red/blue mats: alternating foot taps to each with side stepping left<>right for 1 lap each, then alternating double foot taps to each cone with side stepping left<>right for 1 lap each way. min guard to min assist with occasional  touch to counter as needed for balance.        PT Short Term Goals - 02/02/21 1324       PT SHORT TERM GOAL #1   Title =LTGS               PT Long Term Goals - 02/02/21 1324       PT LONG TERM GOAL #1   Title Pt will be  IND with final HEP in order to indicate improved functional mobility and balance (Target Date: 03/04/21)    Time 4    Period Weeks    Status New    Target Date 03/04/21      PT LONG TERM GOAL #2   Title Pt will improve FGA to >/=28/30 in order to indicate dec fall risk    Time 4    Period Weeks    Status New      PT LONG TERM GOAL #3   Title Pt will negotiate up/down 4 steps in reciprocal pattern without rail at independent level in order to indicate improved mobility in home/community.    Time 4    Period Weeks    Status New      PT LONG TERM GOAL #4   Title Pt will ambulate 1000' over varying outdoor surfaces while scanning environment at independent level in order to indicate safety at home and in community.    Time 4    Period Weeks    Status New               02/25/21 1019  Plan  Clinical Impression Statement Today's skilled session continued to focus on high level balance exercises/activities with no issues reported or noted in session. The pt continues to be challenged with compliant surfaces and/or vision removed tasks. The pt is progressing toward goals and should benefit from continued PT to progress toward unmet goals.  Examination-Activity Limitations Locomotion Level;Lift;Transfers;Stand;Stairs;Squat  Examination-Participation Restrictions Driving;Community Activity  Pt will benefit from skilled therapeutic intervention in order to improve on the following deficits Decreased activity tolerance;Decreased balance;Decreased cognition;Decreased coordination;Decreased endurance;Postural dysfunction;Impaired vision/preception;Decreased strength  Stability/Clinical Decision Making Evolving/Moderate complexity  Rehab Potential Good  PT Frequency 2x / week  PT Duration 4 weeks  PT Treatment/Interventions ADLs/Self Care Home Management;Gait training;Stair training;Functional mobility training;Therapeutic activities;Therapeutic exercise;Balance training;Neuromuscular  re-education;Cognitive remediation;Patient/family education;Vestibular;Visual/perceptual remediation/compensation;Passive range of motion  PT Next Visit Plan Continue to update/challenge as able, continue to address high level balance, work on outdoor gait, floor transfer, compliant surfaces, EO/EC, narrow BOS, and scanning for/navigating around objects.  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   Hemorrhagic stroke ([place]) 01/11/2021   ICH (intracerebral hemorrhage) ([place]) 01/11/2021   Cellulitis of left hand excluding fingers and thumb 12/09/2018   Osteoporosis without current pathological fracture 06/19/2018   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   Allergic asthma, moderate persistent, uncomplicated 44/62/8638   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[place]) 02/08/2016   Cardiomyopathy ([place]) 01/22/2016   Multiple lung nodules on CT 12/14/2015   Nonspecific abnormal electrocardiogram (ECG) (EKG) 12/14/2015  Hepatic cirrhosis ([place]) 09/24/2015   Coronary artery disease involving native coronary artery of native heart without angina pectoris 09/24/2015   Hyperglycemia 06/08/2015   Screen for colon cancer 07/08/2014   Allergy to statin medication 10/23/2012   BPH associated with nocturia 04/18/2012   Routine general medical examination at a health care facility 04/18/2012   Hyperlipidemia with target LDL less than 70 04/27/2010    Essential hypertension 04/27/2010   NASAL POLYP 11/06/2009   RHINOSINUSITIS, 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, [place] [address] [phone] 02/28/21, 6:26 PM   Name: Aaban Griep MRN: [id number] Date of Birth: 16-Jul-1945

## 2021-03-02 ENCOUNTER — Ambulatory Visit: Payer: Medicare Other | Admitting: Occupational Therapy

## 2021-03-02 ENCOUNTER — Other Ambulatory Visit: Payer: Self-pay

## 2021-03-02 ENCOUNTER — Ambulatory Visit: Payer: Medicare Other | Admitting: Physical Therapy

## 2021-03-02 ENCOUNTER — Encounter: Payer: Self-pay | Admitting: Occupational Therapy

## 2021-03-02 ENCOUNTER — Encounter: Payer: Self-pay | Admitting: Physical Therapy

## 2021-03-02 ENCOUNTER — Ambulatory Visit: Payer: Medicare Other | Admitting: Speech Pathology

## 2021-03-02 ENCOUNTER — Encounter: Payer: Self-pay | Admitting: Speech Pathology

## 2021-03-02 DIAGNOSIS — R2681 Unsteadiness on feet: Secondary | ICD-10-CM

## 2021-03-02 DIAGNOSIS — R41841 Cognitive communication deficit: Secondary | ICD-10-CM

## 2021-03-02 DIAGNOSIS — M6281 Muscle weakness (generalized): Secondary | ICD-10-CM

## 2021-03-02 DIAGNOSIS — R278 Other lack of coordination: Secondary | ICD-10-CM

## 2021-03-02 DIAGNOSIS — R4701 Aphasia: Secondary | ICD-10-CM | POA: Diagnosis not present

## 2021-03-02 DIAGNOSIS — I69251 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: Secondary | ICD-10-CM

## 2021-03-02 DIAGNOSIS — R293 Abnormal posture: Secondary | ICD-10-CM

## 2021-03-02 DIAGNOSIS — R41842 Visuospatial deficit: Secondary | ICD-10-CM

## 2021-03-02 DIAGNOSIS — R41840 Attention and concentration deficit: Secondary | ICD-10-CM

## 2021-03-02 DIAGNOSIS — R41844 Frontal lobe and executive function deficit: Secondary | ICD-10-CM

## 2021-03-02 NOTE — Therapy (Signed)
[facility] [address] [place], [place], [address] Phone: [phone]   Fax:  [phone]  Occupational Therapy Treatment  Patient Details  Name: Lee Blevins MRN: [id number] Date of Birth: [birth year] Referring Provider (OT): Boaz f/u [name]   Encounter Date: 03/02/2021   OT End of Session - 03/02/21 1018     Visit Number 6    Number of Visits 17    Date for OT Re-Evaluation 03/30/21    Authorization Type Medicare A & B/[employer]    Authorization Time Period Follow Medicare Guidelines    Authorization - Visit Number 6    Progress Note Due on Visit 10    OT Start Time 1015    OT Stop Time 1100    OT Time Calculation (min) 45 min    Activity Tolerance Patient tolerated treatment well    Behavior During Therapy [facility] for tasks assessed/performed             Past Medical History:  Diagnosis Date   Asthma    Chronic sinusitis    History of CVA (cerebrovascular accident) 2011   hemorrhagic   HTN (hypertension)    Hyperlipidemia    Nasal polyps    Pneumonia     Past Surgical History:  Procedure Laterality Date   NASAL POLYP EXCISION     x2   TONSILLECTOMY      There were no vitals filed for this visit.   Subjective Assessment - 03/02/21 1017     Subjective  Pt denies any pain.    Patient is accompanied by: Family member   spouse   Pertinent History HTN, Parietal Parenchymal ICH (2011), HLD, Lung Nodules, Hepatic Cirrhosis, BPH, Chronic Hypoxic Respiratory Failure    Limitations Fall Risk, decreased safety awareness, R visual field cut    Patient Stated Goals "make sure everything works correctly"    Currently in Pain? No/denies    Pain Score 0-No pain              Spot It with emphasis on cognition and visual scanning Pt req'd mod/max cues for understanding of instructions of activity. Pt req'd cues for strategies.   Perfection req'd max cues for visual scanning strategies of going left to  right and line by line as patient would freq miss bottom right corner for pieces.   Blink first sorting by color and then category switch to symbol/shape. Pt sorted by color with little difficulty but req'd mod  cues for scanning to right for sorting by shape after switch.                       OT Short Term Goals - 03/02/21 1018       OT SHORT TERM GOAL #1   Title Pt will be independent with HEP    Time 4    Period Weeks    Status Achieved    Target Date 03/02/21      OT SHORT TERM GOAL #2   Title Pt will perform environmental scanning with 90% accuracy or greater    Time 4    Period Weeks    Status Achieved      OT SHORT TERM GOAL #3   Title Pt will verbalize understanding of visual strategies for increasing independence and safey with ADLs and IADLs    Time 4    Period Weeks    Status Achieved      OT SHORT TERM GOAL #4  Title Pt will increase coordination in RUE to completing 9 hole peg test in 45 seconds or less    Baseline R 49.15s, L 30.88s    Time 4    Period Weeks    Status Achieved   RUE 35.25s 03/02/21              OT Long Term Goals - 03/02/21 1021       OT LONG TERM GOAL #1   Title Pt will be independent with any updated HEPs    Time 8    Period Weeks    Status New      OT LONG TERM GOAL #2   Title Pt will perform environmental scanning with cognitive component or physical/cognitive task simultaneously with 90% accuracy or greater.    Time 8    Period Weeks    Status New      OT LONG TERM GOAL #3   Title Pt will improve 9 hole peg test with RUE in 40 seconds or less in order to increase fine motor coordination.    Baseline R 49.15s, L 30.88s    Time 8    Period Weeks    Status Achieved   RUE 35.25s on 03/02/21     OT LONG TERM GOAL #4   Title Pt will demonstrate ability to complete Trail Making Test B with 80% accuracy or greater    Baseline eliminated numbers and only did letters at eval    Time 8    Period Weeks     Status New                   Plan - 03/02/21 1106     Clinical Impression Statement Pt is progressing with use of strategies for scanning. Continue to progress towards goals STG check today and pt has met 4/4 STGs.    OT Occupational Profile and History Problem Focused Assessment - Including review of records relating to presenting problem    Occupational performance deficits (Please refer to evaluation for details): ADL's;IADL's;Leisure    Body Structure / Function / Physical Skills ADL;Decreased knowledge of use of DME;Dexterity;Flexibility;FMC;Vision;Endurance;IADL;ROM;UE functional use    Cognitive Skills Attention;Problem Solve;Safety Awareness;Sequencing    Rehab Potential Good    Clinical Decision Making Limited treatment options, no task modification necessary    Comorbidities Affecting Occupational Performance: None    Modification or Assistance to Complete Evaluation  No modification of tasks or assist necessary to complete eval    OT Frequency 2x / week    OT Duration 8 weeks    OT Treatment/Interventions Patient/family education;Neuromuscular education;Functional Mobility Training;Visual/perceptual remediation/compensation;Cognitive remediation/compensation;Therapeutic exercise;Therapeutic activities;DME and/or AE instruction;Self-care/ADL training    Plan continue to work towards goals, environmental scanning with cog/phys component, cognitve/ visual perceptual skills, alternating attention    Consulted and Agree with Plan of Care Patient;Family member/caregiver    Family Member Consulted spouse, [name]             Patient will benefit from skilled therapeutic intervention in order to improve the following deficits and impairments:   Body Structure / Function / Physical Skills: ADL, Decreased knowledge of use of DME, Dexterity, Flexibility, FMC, Vision, Endurance, IADL, ROM, UE functional use Cognitive Skills: Attention, Problem Solve, Safety Awareness,  Sequencing     Visit Diagnosis: Muscle weakness (generalized)  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)  Unsteadiness on feet  Attention and concentration deficit  Other lack of coordination  Visuospatial deficit  Frontal lobe and executive function deficit    Problem List Patient Active Problem List   Diagnosis Date Noted   Intraparenchymal hemorrhage of brain ([place]) 01/17/2021   Hemorrhagic stroke ([place]) 01/11/2021   ICH (intracerebral hemorrhage) ([place]) 01/11/2021   Cellulitis of left hand excluding fingers and thumb 12/09/2018   Osteoporosis without current pathological fracture 06/19/2018   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   Allergic asthma, moderate persistent, uncomplicated 61/60/7371   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   Cardiomyopathy ([place]) 01/22/2016   Multiple lung nodules on CT 12/14/2015   Nonspecific abnormal electrocardiogram (ECG) (EKG) 12/14/2015   Hepatic cirrhosis ([place]) 09/24/2015   Coronary artery disease involving native coronary artery of native heart without angina pectoris 09/24/2015   Hyperglycemia 06/08/2015   Screen for colon cancer 07/08/2014   Allergy to statin medication 10/23/2012   BPH associated with nocturia 04/18/2012   Routine general medical examination at a health care facility 04/18/2012   Hyperlipidemia with target LDL less than 70 04/27/2010   Essential hypertension 04/27/2010   NASAL POLYP 11/06/2009   RHINOSINUSITIS, CHRONIC 02/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 [place], [place], [address] Phone: [phone]   Fax:  [phone]  Name: Lee Blevins MRN: [id number] Date of Birth: December 28, 1945

## 2021-03-02 NOTE — Therapy (Signed)
[facility] [address] [place], [place], [address] Phone: [phone]   Fax:  [phone]  Physical Therapy Treatment  Patient Details  Name: Lee Blevins MRN: [id number] Date of Birth: 1945-06-11 Referring Provider (PT): Marlowe [place], [place]   Encounter Date: 03/02/2021   PT End of Session - 03/02/21 1051     Visit Number 6    Number of Visits 9    Date for PT Re-Evaluation 03/04/21    Authorization Type Medicare, [place] (10th visit progress note)    Progress Note Due on Visit 10    PT Start Time 0933    PT Stop Time 1015    PT Time Calculation (min) 42 min    Equipment Utilized During Treatment Gait belt    Activity Tolerance Patient tolerated treatment well    Behavior During Therapy WFL for tasks assessed/performed             Past Medical History:  Diagnosis Date   Asthma    Chronic sinusitis    History of CVA (cerebrovascular accident) 2011   hemorrhagic   HTN (hypertension)    Hyperlipidemia    Nasal polyps    Pneumonia     Past Surgical History:  Procedure Laterality Date   NASAL POLYP EXCISION     x2   TONSILLECTOMY      There were no vitals filed for this visit.   Subjective Assessment - 03/02/21 0936     Subjective No new falls, pain, or complaints to report.    Patient is accompained by: Family member   [name]   Pertinent History hemorrhagic CVA, HTN, HLD, asthma    Limitations Walking;Standing    How long can you stand comfortably? 30 mins    How long can you walk comfortably? 30 mins    Patient Stated Goals "To get back to as normal as I can"    Currently in Pain? No/denies                [facility] PT Assessment - 03/02/21 0937       Functional Gait  Assessment   Gait assessed  Yes    Gait Level Surface Walks 20 ft in less than 7 sec but greater than 5.5 sec, uses assistive device, slower speed, mild gait deviations, or deviates 6-10 in outside of the 12 in walkway  width.    Change in Gait Speed Able to smoothly change walking speed without loss of balance or gait deviation. Deviate no more than 6 in outside of the 12 in walkway width.    Gait with Horizontal Head Turns Performs head turns smoothly with slight change in gait velocity (eg, minor disruption to smooth gait path), deviates 6-10 in outside 12 in walkway width, or uses an assistive device.    Gait with Vertical Head Turns Performs head turns with no change in gait. Deviates no more than 6 in outside 12 in walkway width.    Gait and Pivot Turn Pivot turns safely within 3 sec and stops quickly with no loss of balance.    Step Over Obstacle Is able to step over 2 stacked shoe boxes taped together (9 in total height) without changing gait speed. No evidence of imbalance.    Gait with Narrow Base of Support Ambulates 7-9 steps.    Gait with Eyes Closed Walks 20 ft, slow speed, abnormal gait pattern, evidence for imbalance, deviates 10-15 in outside 12 in walkway width. Requires more than 9  sec to ambulate 20 ft.    Ambulating Backwards Walks 20 ft, slow speed, abnormal gait pattern, evidence for imbalance, deviates 10-15 in outside 12 in walkway width.    Steps Alternating feet, must use rail.    Total Score 22    FGA comment: 19-24 = medium risk fall                    [facility] Adult PT Treatment/Exercise - 03/02/21 0952       Transfers   Transfers Sit to Stand;Stand to Sit    Sit to Stand 6: Modified independent (Device/Increase time)    Stand to Sit 6: Modified independent (Device/Increase time)      Ambulation/Gait   Ambulation/Gait Yes    Ambulation/Gait Assistance 6: Modified independent (Device/Increase time);5: Supervision    Ambulation/Gait Assistance Details in/out of clinic and during session.    Assistive device None    Gait Pattern Step-through pattern;Trunk flexed    Ambulation Surface Level;Indoor      High Level Balance   High Level Balance Activities Negotitating  around obstacles;Negotiating over obstacles;Other (comment)    High Level Balance Comments Pt navigated around cones on solid surface and over hurdles on red/blue mats 3 laps down and back. The pt required min guard and cues to slow down and stand closer to hurdles before stepping over.      Neuro Re-ed    Neuro Re-ed Details  for balance/muscle re-ed/strengthening: alternating tapping cones while standing on airex. 10 reps forward taps and 10 reps cross taps. The pt demonstrated intermittent decreased foot clearance on the L, knocking over cone with L foot.                 Balance Exercises - 03/02/21 1015       Balance Exercises: Standing   Sit to Stand Standard surface;Without upper extremity support;Foam/compliant surface;Limitations;Other (comment)    Sit to Stand Limitations The pt performed 10 reps sit<>stands with standard stance on solid surface with no UE support. The pt then performed 10 reps each of sit<>stands with feet on airex with standard stance, and staggered stance with each foot forward. The pt required min guard to complete, demonstrating no LOB.                  PT Short Term Goals - 02/02/21 1324       PT SHORT TERM GOAL #1   Title =LTGS               PT Long Term Goals - 03/02/21 1057       PT LONG TERM GOAL #1   Title Pt will be IND with final HEP in order to indicate improved functional mobility and balance (Target Date: 03/04/21)    Time 4    Period Weeks    Status New      PT LONG TERM GOAL #2   Title Pt will improve FGA to >/=28/30 in order to indicate dec fall risk    Baseline 03/02/2021 22/30    Status Partially Met      PT LONG TERM GOAL #3   Title Pt will negotiate up/down 4 steps in reciprocal pattern without rail at independent level in order to indicate improved mobility in home/community.    Time 4    Period Weeks    Status New      PT LONG TERM GOAL #4   Title Pt will ambulate 1000' over varying outdoor surfaces  while  scanning environment at independent level in order to indicate safety at home and in community.    Time 4    Period Weeks    Status New                   Plan - 03/02/21 1052     Clinical Impression Statement Today's skilled session began to check long term goals for upcoming recert vs discharge and to continued to focus on high level balance activities. The pts FGA has improved 2 pts to 22/30 since eval, and should continue to benefit from continued skilled PT to address remaining functional deficits.    Personal Factors and Comorbidities Comorbidity 3+;Age    Comorbidities see above    Examination-Activity Limitations Locomotion Level;Lift;Transfers;Stand;Stairs;Squat    Examination-Participation Restrictions Driving;Community Activity    Stability/Clinical Decision Making Evolving/Moderate complexity    Rehab Potential Good    PT Frequency 2x / week    PT Duration 4 weeks    PT Treatment/Interventions ADLs/Self Care Home Management;Gait training;Stair training;Functional mobility training;Therapeutic activities;Therapeutic exercise;Balance training;Neuromuscular re-education;Cognitive remediation;Patient/family education;Vestibular;Visual/perceptual remediation/compensation;Passive range of motion    PT Next Visit Plan Check rest of goals for recert vs discharge. Pt appears to be progressing and has potential to progress toward unmet goals however pt not fully open to continuing PT. Pt and spouse to let PT know at next visit- he does have ~2 weeks of appts scheduled so will need to add weeks or remove these based on what is decided at next session. Continue to update/challenge as able, continue to address high level balance, work on outdoor gait, floor transfer, compliant surfaces, EO/EC, narrow BOS, and scanning for/navigating around objects.    PT Home Exercise Plan Access Code: [email or web address]    Consulted and Agree with Plan of Care Patient;Family member/caregiver    Family  Member Consulted wife             Patient will benefit from skilled therapeutic intervention in order to improve the following deficits and impairments:  Decreased activity tolerance, Decreased balance, Decreased cognition, Decreased coordination, Decreased endurance, Postural dysfunction, Impaired vision/preception, Decreased strength  Visit Diagnosis: Muscle weakness (generalized)  Unsteadiness on feet  Abnormal posture     Problem List Patient Active Problem List   Diagnosis Date Noted   Intraparenchymal hemorrhage of brain ([facility]) 01/17/2021   Hemorrhagic stroke ([place]) 01/11/2021   ICH (intracerebral hemorrhage) ([place]) 01/11/2021   Cellulitis of left hand excluding fingers and thumb 12/09/2018   Osteoporosis without current pathological fracture 06/19/2018   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   Allergic asthma, moderate persistent, uncomplicated 75/88/3254   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[place]) 02/08/2016   Cardiomyopathy ([place]) 01/22/2016   Multiple lung nodules on CT 12/14/2015   Nonspecific abnormal electrocardiogram (ECG) (EKG) 12/14/2015   Hepatic cirrhosis ([place]) 09/24/2015   Coronary artery disease involving native coronary artery of native heart without angina pectoris 09/24/2015   Hyperglycemia 06/08/2015   Screen for colon cancer 07/08/2014   Allergy to statin medication 10/23/2012   BPH associated with nocturia 04/18/2012   Routine general medical examination at a health care facility 04/18/2012   Hyperlipidemia with target LDL less than 70 04/27/2010   Essential hypertension 04/27/2010   NASAL POLYP 11/06/2009   RHINOSINUSITIS, 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 [place], [place], [address] Phone: [phone]   Fax:  [phone]  Name: Lee Blevins MRN: [id number] Date of Birth: 1946/04/12  This  note has been reviewed  and edited by supervising CI.   Willow Ora, PTA, [facility] [address], [place], [place] [address] [phone] 03/03/21, 9:51 AM

## 2021-03-02 NOTE — Therapy (Addendum)
[facility] [address] [place], [place], [address] Phone: [phone]   Fax:  [phone]  Speech Language Pathology Treatment  Patient Details  Name: Lee Blevins MRN: [id number] Date of Birth: 1946/01/27 Referring Provider (SLP): Cathlyn Parsons PA-C   Encounter Date: 03/02/2021   End of Session - 03/02/21 1103     Visit Number 4    Number of Visits 17    Date for SLP Re-Evaluation 04/05/21    SLP Start Time 29    SLP Stop Time  1140    SLP Time Calculation (min) 40 min    Activity Tolerance Patient tolerated treatment well             Past Medical History:  Diagnosis Date   Asthma    Chronic sinusitis    History of CVA (cerebrovascular accident) 2011   hemorrhagic   HTN (hypertension)    Hyperlipidemia    Nasal polyps    Pneumonia     Past Surgical History:  Procedure Laterality Date   NASAL POLYP EXCISION     x2   TONSILLECTOMY      There were no vitals filed for this visit.   Subjective Assessment - 03/02/21 1102     Subjective "Nothing new."    Patient is accompained by: Family member    Currently in Pain? No/denies                   ADULT SLP TREATMENT - 03/02/21 0001       General Information   Behavior/Cognition Alert;Cooperative      Treatment Provided   Treatment provided Cognitive-Linquistic      Cognitive-Linquistic Treatment   Treatment focused on Cognition    Skilled Treatment Pt reports brain breaks have been beneficial to implement in his day-to-day but was not able to provide any examples. SLP faciliated time managment/task completion by completing an organizational task where he was instructed to generate a list materials for upcoming home projects and describe steps needed to complete tasks. Pt required modA verbal cues to remain on topic and complete sequencing of steps. Pt would perseverate on "well, I already have these materials at home and I  don't have to buy them." SLP rephrased instructions to aid comprehension. Pt benefited from redirection for topic maintenance.      Assessment / Recommendations / Plan   Plan Continue with current plan of care      Progression Toward Goals   Progression toward goals Progressing toward goals                SLP Short Term Goals - 02/16/21 1551       SLP SHORT TERM GOAL #1   Title Complete further assessment of expressive language and receptive language.    Baseline Expression: occasional paraphasias in conversation noted (BNT for baseline?); Receptive language: assess yes/no questions, following directions, reading comprehension at sentence/paragraph level *adjust goals as needed    Time 2    Period Weeks    Status Achieved    Target Date 02/17/21      SLP SHORT TERM GOAL #2   Title Patient will read and follow multiple- step directions to increase working memory/ comprehension of written information with 80% accuracy given minA across 3 sessions.    Time 4    Period Weeks    Status Revised    Target Date 03/06/21      SLP SHORT TERM GOAL #3   Title  Pt wife/caregiver will identify 3+ strategies to aid pt's memory/comprehension of caregiver provided verbal information independently across 3 sessions.    Time 4    Period Weeks    Status Deferred    Target Date 03/06/21      SLP SHORT TERM GOAL #4   Title Pt will recall and demonstrate 3 memory strategies to increase recall of important information at home independently across 2 sessions.    Time 4    Period Weeks    Status New    Target Date 03/06/21      SLP SHORT TERM GOAL #5   Title Pt will recall and demonstrate 3 attention strategies to increase recall of important information at home independently across 2 sessions.    Time 4    Period Weeks    Status New    Target Date 03/06/21              SLP Long Term Goals - 02/16/21 1703       SLP LONG TERM GOAL #1   Title Patient will read and follow multiple-  step directions to increase comprehension of written information with 80% accuracy  across 3 sessions.    Time 8    Period Weeks    Status New    Target Date 03/31/21      SLP LONG TERM GOAL #2   Title Pt wife/caregiver will report use of strategies to aid pt's comprehension of caregiver provided verbal information independently across 3 sessions.    Time 8    Period Weeks    Status New    Target Date 03/31/21      SLP LONG TERM GOAL #3   Title Pt will report use of 1+ attention strategies to increase recall of important information at home independently.    Time 8    Period Weeks    Status New    Target Date 03/31/21      SLP LONG TERM GOAL #4   Title Pt will report use 1+ memory strategies to increase recall of important information at home independently across 2 sessions.    Time 8    Period Weeks    Status New    Target Date 03/31/21              Plan - 03/02/21 1151     Clinical Impression Statement See tx note. Pt cont to exhibit decreased awareness of impairments. SLP had pt compare his performance on two, separate tasks to increase awareness of impairments. Pt demonstrated difficulty with thought organization. SLP rec skilled services to address cognition and aphasia to maximize functional communication and decrease instances of communication breakdowns. Cont with current POC.    Speech Therapy Frequency 2x / week    Duration 8 weeks    Treatment/Interventions Language facilitation;Environmental controls;Cueing hierarchy;SLP instruction and feedback;Compensatory techniques;Functional tasks;Compensatory strategies;Internal/external aids;Multimodal communcation approach;Patient/family education    Potential to Achieve Goals Good    Consulted and Agree with Plan of Care Patient;Family member/caregiver    Family Member Consulted Wife             Patient will benefit from skilled therapeutic intervention in order to improve the following deficits and impairments:    Cognitive communication deficit    Problem List Patient Active Problem List   Diagnosis Date Noted   Intraparenchymal hemorrhage of brain ([facility]) 01/17/2021   Hemorrhagic stroke ([place]) 01/11/2021   ICH (intracerebral hemorrhage) ([place]) 01/11/2021   Cellulitis of left hand excluding fingers and  thumb 12/09/2018   Osteoporosis without current pathological fracture 06/19/2018   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   Allergic asthma, moderate persistent, uncomplicated 67/61/9509   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   Cardiomyopathy ([place]) 01/22/2016   Multiple lung nodules on CT 12/14/2015   Nonspecific abnormal electrocardiogram (ECG) (EKG) 12/14/2015   Hepatic cirrhosis ([place]) 09/24/2015   Coronary artery disease involving native coronary artery of native heart without angina pectoris 09/24/2015   Hyperglycemia 06/08/2015   Screen for colon cancer 07/08/2014   Allergy to statin medication 10/23/2012   BPH associated with nocturia 04/18/2012   Routine general medical examination at a health care facility 04/18/2012   Hyperlipidemia with target LDL less than 70 04/27/2010   Essential hypertension 04/27/2010   NASAL POLYP 11/06/2009   RHINOSINUSITIS, 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, CBIS  03/02/2021, 12:22 PM  [facility] [address] [place], [place], [address] Phone: [phone]   Fax:  [phone]   Name: Lee Blevins MRN: [id number] Date of Birth: 1945/08/09

## 2021-03-03 ENCOUNTER — Other Ambulatory Visit: Payer: Self-pay | Admitting: Internal Medicine

## 2021-03-04 ENCOUNTER — Ambulatory Visit: Payer: Medicare Other

## 2021-03-04 ENCOUNTER — Ambulatory Visit: Payer: Medicare Other | Admitting: Occupational Therapy

## 2021-03-04 ENCOUNTER — Ambulatory Visit: Payer: Medicare Other | Admitting: Physical Therapy

## 2021-03-04 ENCOUNTER — Encounter: Payer: Self-pay | Admitting: Physical Therapy

## 2021-03-04 ENCOUNTER — Other Ambulatory Visit: Payer: Self-pay

## 2021-03-04 DIAGNOSIS — R41840 Attention and concentration deficit: Secondary | ICD-10-CM

## 2021-03-04 DIAGNOSIS — M6281 Muscle weakness (generalized): Secondary | ICD-10-CM

## 2021-03-04 DIAGNOSIS — R4701 Aphasia: Secondary | ICD-10-CM | POA: Diagnosis not present

## 2021-03-04 DIAGNOSIS — R41844 Frontal lobe and executive function deficit: Secondary | ICD-10-CM

## 2021-03-04 DIAGNOSIS — R293 Abnormal posture: Secondary | ICD-10-CM

## 2021-03-04 DIAGNOSIS — R41842 Visuospatial deficit: Secondary | ICD-10-CM

## 2021-03-04 DIAGNOSIS — R41841 Cognitive communication deficit: Secondary | ICD-10-CM

## 2021-03-04 DIAGNOSIS — R2681 Unsteadiness on feet: Secondary | ICD-10-CM

## 2021-03-04 DIAGNOSIS — R278 Other lack of coordination: Secondary | ICD-10-CM

## 2021-03-04 NOTE — Therapy (Addendum)
[facility] [address] [place], [place], [address] Phone: [phone]   Fax:  [phone]  Physical Therapy Treatment/Re-Cert  Patient Details  Name: Lee Blevins MRN: [id number] Date of Birth: [birth year] Referring Provider (PT): Marlowe Shores, [place]   Encounter Date: 03/04/2021   PT End of Session - 03/04/21 0941     Visit Number 7    Number of Visits 15    Date for PT Re-Evaluation 81/27/51   per re-cert on 70/01/74   Authorization Type Medicare, [place] (10th visit progress note)    Progress Note Due on Visit 10    PT Start Time 0935   pt arrived late   PT Stop Time 1015    PT Time Calculation (min) 40 min    Equipment Utilized During Treatment Gait belt    Activity Tolerance Patient tolerated treatment well    Behavior During Therapy WFL for tasks assessed/performed             Past Medical History:  Diagnosis Date   Asthma    Chronic sinusitis    History of CVA (cerebrovascular accident) 2011   hemorrhagic   HTN (hypertension)    Hyperlipidemia    Nasal polyps    Pneumonia     Past Surgical History:  Procedure Laterality Date   NASAL POLYP EXCISION     x2   TONSILLECTOMY      There were no vitals filed for this visit.   Subjective Assessment - 03/04/21 0941     Subjective wants to make the decision about continuing therapy or not at the end of the day.    Patient is accompained by: Family member   [name]   Pertinent History hemorrhagic CVA, HTN, HLD, asthma    Limitations Walking;Standing    How long can you stand comfortably? 30 mins    How long can you walk comfortably? 30 mins    Patient Stated Goals "To get back to as normal as I can"    Currently in Pain? No/denies                [facility] PT Assessment - 03/04/21 0942       Assessment   Medical Diagnosis Intraparenchymal Hemorrhage    Referring Provider (PT) Marlowe Shores, PA    Onset Date/Surgical Date 01/11/21       Prior Function   Level of Independence Independent      Ambulation/Gait   Ambulation/Gait Yes    Assistive device None    Gait Pattern Step-through pattern;Trunk flexed    Gait velocity 9.28 = 3.54 ft/sec      High Level Balance   High Level Balance Comments mCTSIB: conditions 1-4: 30 seconds. incr postural sway on condition 4. SLS: 1-2 seconds each leg.                    Therapeutic Activity:   Had conversation with pt and pt's spouse about continuing with therapy due to fall risk and continued balance impairments as pt initially wanted today to be his D/C visit. After further discussing current impairments and how they relate to his balance/function in everyday life, pt and pt's spouse in agreement to continue with therapy for another month.      03/04/21 0942  Ambulation/Gait  Ambulation/Gait Yes  Ambulation/Gait Assistance 5: Supervision;7: Independent  Ambulation/Gait Assistance Details Supervision with head motions on grass  Ambulation Distance (Feet) 1000 Feet  Assistive device None  Gait Pattern Step-through  pattern;Trunk flexed  Ambulation Surface Level;Indoor;Outdoor;Paved;Grass  Gait velocity 9.28 = 3.54 ft/sec  High Level Balance  High Level Balance Comments mCTSIB: conditions 1-4: 30 seconds. incr postural sway on condition 4. SLS: 1-2 seconds each leg.      Access Code: [email or web address] URL: [email or web address] Date: 03/04/2021 Prepared by: Janann August  Reviewed exercises below - in forwards/backwards direction, cued for slowed pace for incr balance challenge.   Exercises Tandem Walking with Counter Support - 1 x daily - 5 x weekly - 1 sets - 3 reps Toe Walking with Counter Support - 1 x daily - 5 x weekly - 1 sets - 3 reps Heel Walking with Counter Support - 1 x daily - 5 x weekly - 1 sets - 3-4 reps  Verbally reviewed exercises below:  Standing Near Stance in Corner with Eyes Closed - 1 x daily - 5 x weekly - 1 sets - 3 reps -  30 hold Standing Balance in Corner with Eyes Closed - 1 x daily - 5 x weekly - 1 sets - 10 reps     PT Education - 03/04/21 1229     Education Details Discussed POC going forwards.    Person(s) Educated Patient;Spouse    Methods Explanation    Comprehension Verbalized understanding              PT Short Term Goals - 02/02/21 1324       PT SHORT TERM GOAL #1   Title =LTGS               PT Long Term Goals - 03/04/21 1223       PT LONG TERM GOAL #1   Title Pt will be IND with final HEP in order to indicate improved functional mobility and balance (Target Date: 03/04/21)    Baseline met, but will continue to benefit from updates/reivisons.    Time 4    Period Weeks    Status Achieved      PT LONG TERM GOAL #2   Title Pt will improve FGA to >/=28/30 in order to indicate dec fall risk    Baseline 03/02/2021 22/30    Status Partially Met      PT LONG TERM GOAL #3   Title Pt will negotiate up/down 4 steps in reciprocal pattern without rail at independent level in order to indicate improved mobility in home/community.    Baseline needs single handrail.    Time 4    Period Weeks    Status Not Met      PT LONG TERM GOAL #4   Title Pt will ambulate 1000' over varying outdoor surfaces while scanning environment at independent level in order to indicate safety at home and in community.    Baseline needed supervision for head motions on grass, otherwise performs independently.    Time 4    Period Weeks    Status Partially Met            Ongoing LTGS:  PT Long Term Goals - 03/07/21 1440       PT LONG TERM GOAL #1   Title Pt will be IND with final HEP in order to indicate improved functional mobility and balance. ALL LTGS DUE 04/04/21    Baseline met, but will continue to benefit from updates/reivisons.    Time 4    Period Weeks    Status On-going    Target Date 04/04/21      PT LONG TERM GOAL #2   Title Pt  will improve FGA to >/=26/30 in order to indicate  dec fall risk    Baseline 03/02/2021 22/30    Time 4    Period Weeks    Status Revised      PT LONG TERM GOAL #3   Title Pt will negotiate up/down 4 steps in reciprocal pattern without rail at independent level in order to indicate improved mobility in home/community.    Baseline needs single handrail.    Time 4    Period Weeks    Status On-going      PT LONG TERM GOAL #4   Title Pt will perform SLS on both legs for at least 5 seconds in order to demo improved SLS for IADLs and gait.    Baseline 1-2 seconds each leg.    Time 4    Period Weeks    Status New                 03/07/21 1436  Plan  Clinical Impression Statement Checked remainder of LTGs today with pt meeting LTG #1 with performing HEP, but did need cues to help slow down pace for more control/to increase the challenge. Pt did not meet LTG #3, continues to need single handrail when performing steps. Pt partially met LTG #4, able to ambulate outdoors independently, however when performing head motions on grass pt did need more supervision. Assessed mCTSIB today with pt able to perform conditions 1-4 in 30 seconds, but did have incr postural sway on condition 4 indicating a decr vestibular input for balance. Pt challenged by SLS bilat, only able to perform 1-2 seconds each leg. Discussed with pt and pt's spouse to re-cert to continue to work on high level balance and decr pt's risk of falls. Will continue for an additional 4 weeks. LTGs updated as appropriate.  Personal Factors and Comorbidities Comorbidity 3+;Age  Comorbidities see above  Examination-Activity Limitations Locomotion Level;Lift;Transfers;Stand;Stairs;Squat  Examination-Participation Restrictions Driving;Community Activity  Pt will benefit from skilled therapeutic intervention in order to improve on the following deficits Decreased activity tolerance;Decreased balance;Decreased cognition;Decreased coordination;Decreased endurance;Postural  dysfunction;Impaired vision/preception;Decreased strength  Stability/Clinical Decision Making Evolving/Moderate complexity  Rehab Potential Good  PT Frequency 2x / week  PT Duration 4 weeks  PT Treatment/Interventions ADLs/Self Care Home Management;Gait training;Stair training;Functional mobility training;Therapeutic activities;Therapeutic exercise;Balance training;Neuromuscular re-education;Cognitive remediation;Patient/family education;Vestibular;Visual/perceptual remediation/compensation;Passive range of motion  PT Next Visit Plan review remainder of HEP and make corner balance more challenging.  continue to address high level balance, work on outdoor gait, floor transfer, compliant surfaces, EO/EC, narrow BOS, SLS tasks.  PT Home Exercise Plan Access Code: [email or web address]  Consulted and Agree with Plan of Care Patient;Family member/caregiver  Family Member Consulted wife       Patient will benefit from skilled therapeutic intervention in order to improve the following deficits and impairments:     Visit Diagnosis: Muscle weakness (generalized)  Unsteadiness on feet  Abnormal posture     Problem List Patient Active Problem List   Diagnosis Date Noted   Intraparenchymal hemorrhage of brain ([place]) 01/17/2021   Hemorrhagic stroke ([place]) 01/11/2021   ICH (intracerebral hemorrhage) ([place]) 01/11/2021   Cellulitis of left hand excluding fingers and thumb 12/09/2018   Osteoporosis without current pathological fracture 06/19/2018   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   Allergic asthma, moderate persistent, uncomplicated 93/81/8299   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[place]) 02/08/2016   Cardiomyopathy ([place]) 01/22/2016   Multiple lung nodules on CT 12/14/2015   Nonspecific abnormal electrocardiogram (ECG) (EKG) 12/14/2015   Hepatic cirrhosis ([place])  09/24/2015   Coronary artery disease involving native coronary artery of native heart without angina pectoris 09/24/2015   Hyperglycemia  06/08/2015   Screen for colon cancer 07/08/2014   Allergy to statin medication 10/23/2012   BPH associated with nocturia 04/18/2012   Routine general medical examination at a health care facility 04/18/2012   Hyperlipidemia with target LDL less than 70 04/27/2010   Essential hypertension 04/27/2010   NASAL POLYP 11/06/2009   RHINOSINUSITIS, CHRONIC 02/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 [address] [place], [place], [address] Phone: [phone]   Fax:  [phone]  Name: Syair Fricker MRN: [id number] Date of Birth: 03-21-1946

## 2021-03-04 NOTE — Therapy (Signed)
[facility] [address] [place], [place], [address] Phone: [phone]   Fax:  [phone]  Speech Language Pathology Treatment  Patient Details  Name: Lee Blevins MRN: [id number] Date of Birth: Apr 28, 1945 Referring Provider (SLP): Cathlyn Parsons PA-C   Encounter Date: 03/04/2021   End of Session - 03/04/21 1316     Visit Number 5    Number of Visits 17    Date for SLP Re-Evaluation 04/05/21    SLP Start Time 1105    SLP Stop Time  8938    SLP Time Calculation (min) 40 min    Activity Tolerance Patient tolerated treatment well             Past Medical History:  Diagnosis Date   Asthma    Chronic sinusitis    History of CVA (cerebrovascular accident) 2011   hemorrhagic   HTN (hypertension)    Hyperlipidemia    Nasal polyps    Pneumonia     Past Surgical History:  Procedure Laterality Date   NASAL POLYP EXCISION     x2   TONSILLECTOMY      There were no vitals filed for this visit.   Subjective Assessment - 03/04/21 1108     Subjective Pt provided examles of forgeotting phone numbers.    Patient is accompained by: Family member   Lee Blevins   Currently in Pain? No/denies                   ADULT SLP TREATMENT - 03/04/21 1109       General Information   Behavior/Cognition Alert;Cooperative      Treatment Provided   Treatment provided Cognitive-Linquistic      Cognitive-Linquistic Treatment   Treatment focused on Cognition    Skilled Treatment Pt and wife did one of the projects from last session - wife stated pt did tht steps "boom boom boom" but had difficulty explaining the steps. Pt appeared to explain away some of this deficits "That's not important to me." SLP asked pt for examples of memory difficulties since d/c - in last two weeks. Pt denied differences -  Wife agreed but gave example of "trailing off with this thought process". Wife stated Lee Blevins had difficulty  with telling her stpes to make coffee, "bu he might not have been albe to tell me that before the stroke - I never asked him that." SLP did not go over any memory strategies due to this. SLP had pt tell the steps of his job yesterday and he did so with some minor pauses. SLP targeted pt's working memory in a route finding task from his house to [employer]. Pt told SLP he oculd not do the task due to not knowing street names so SLP told pt to do this task with landmarks instead of road names. Pt req'd mod-max A, faded to mod A consistently to use landmarks in order to trace his route 4 streets ([place], [place], [place], [place]). Pt did not recall any street names at all, even [address] (major street just [place] of his home, which his street "T"s into). SLP provided homework for working memory and explained to pt/wife how to perform this.      Assessment / Recommendations / Plan   Plan Continue with current plan of care      Progression Toward Goals   Progression toward goals Progressing toward goals   pt (and wife?) explaining away deficits detracts from progress  SLP Education - 03/04/21 1315     Education Details deficit areas - working memory    [employer]) Educated Patient;Spouse    Methods Explanation    Comprehension Verbalized understanding;Need further instruction              SLP Short Term Goals - 03/04/21 1317       SLP SHORT TERM GOAL #1   Title Complete further assessment of expressive language and receptive language.    Baseline Expression: occasional paraphasias in conversation noted (BNT for baseline?); Receptive language: assess yes/no questions, following directions, reading comprehension at sentence/paragraph level *adjust goals as needed    Time 2    Period Weeks    Status Achieved    Target Date 02/17/21      SLP SHORT TERM GOAL #2   Title Patient will read and follow multiple- step directions to increase working memory/  comprehension of written information with 80% accuracy given minA across 3 sessions.    Time 4    Period Weeks    Status Revised    Target Date 03/06/21      SLP SHORT TERM GOAL #3   Title Pt wife/caregiver will identify 3+ strategies to aid pt's memory/comprehension of caregiver provided verbal information independently across 3 sessions.    Time 4    Period Weeks    Status Deferred    Target Date 03/06/21      SLP SHORT TERM GOAL #4   Title Pt will recall and demonstrate 3 memory strategies to increase recall of important information at home independently across 2 sessions.    Time 4    Period Weeks    Status New    Target Date 03/06/21      SLP SHORT TERM GOAL #5   Title Pt will recall and demonstrate 3 attention strategies to increase recall of important information at home independently across 2 sessions.    Time 4    Period Weeks    Status On-going    Target Date 03/06/21              SLP Long Term Goals - 03/04/21 1317       SLP LONG TERM GOAL #1   Title Patient will read and follow multiple- step directions to increase comprehension of written information with 80% accuracy  across 3 sessions.    Time 8    Period Weeks    Status New    Target Date 03/31/21      SLP LONG TERM GOAL #2   Title Pt wife/caregiver will report use of strategies to aid pt's comprehension of caregiver provided verbal information independently across 3 sessions.    Time 8    Period Weeks    Status On-going    Target Date 03/31/21      SLP LONG TERM GOAL #3   Title Pt will report use of 1+ attention strategies to increase recall of important information at home independently.    Time 8    Period Weeks    Status On-going    Target Date 03/31/21      SLP LONG TERM GOAL #4   Title Pt will report use 1+ memory strategies to increase recall of important information at home independently across 2 sessions.    Time 8    Period Weeks    Status On-going    Target Date 03/31/21               Plan - 03/04/21 1316  Clinical Impression Statement See tx note. Pt cont to exhibit decreased awareness of impairments. Pt demonstrated difficulty with thought organization, and working memory. Pt with frequent pauses in conversational speech. SLP rec skilled services to address cognition and aphasia to maximize functional communication and decrease instances of communication breakdowns. Cont with current POC.    Speech Therapy Frequency 2x / week    Duration 8 weeks    Treatment/Interventions Language facilitation;Environmental controls;Cueing hierarchy;SLP instruction and feedback;Compensatory techniques;Functional tasks;Compensatory strategies;Internal/external aids;Multimodal communcation approach;Patient/family education    Potential to Achieve Goals Good    Consulted and Agree with Plan of Care Patient;Family member/caregiver    Family Member Consulted Wife             Patient will benefit from skilled therapeutic intervention in order to improve the following deficits and impairments:   Cognitive communication deficit  Aphasia    Problem List Patient Active Problem List   Diagnosis Date Noted   Intraparenchymal hemorrhage of brain ([place]) 01/17/2021   Hemorrhagic stroke ([place]) 01/11/2021   ICH (intracerebral hemorrhage) ([place]) 01/11/2021   Cellulitis of left hand excluding fingers and thumb 12/09/2018   Osteoporosis without current pathological fracture 06/19/2018   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   Allergic asthma, moderate persistent, uncomplicated 45/06/8880   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   Cardiomyopathy ([place]) 01/22/2016   Multiple lung nodules on CT 12/14/2015   Nonspecific abnormal electrocardiogram (ECG) (EKG) 12/14/2015   Hepatic cirrhosis ([place]) 09/24/2015   Coronary artery disease involving native coronary artery of native heart without angina pectoris 09/24/2015   Hyperglycemia 06/08/2015   Screen for colon  cancer 07/08/2014   Allergy to statin medication 10/23/2012   BPH associated with nocturia 04/18/2012   Routine general medical examination at a health care facility 04/18/2012   Hyperlipidemia with target LDL less than 70 04/27/2010   Essential hypertension 04/27/2010   NASAL POLYP 11/06/2009   RHINOSINUSITIS, CHRONIC 02/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

## 2021-03-04 NOTE — Therapy (Signed)
[facility] [address] [place], [place], [address] Phone: [phone]   Fax:  [phone]  Occupational Therapy Treatment  Patient Details  Name: Lee Blevins MRN: [id number] Date of Birth: 09-23-1945 Referring Provider (OT): [name] f/u [name]   Encounter Date: 03/04/2021   OT End of Session - 03/04/21 1023     Visit Number 7    Number of Visits 17    Date for OT Re-Evaluation 03/30/21    Authorization Type Medicare A & B/UHC    Authorization Time Period Follow Medicare Guidelines    Authorization - Visit Number 7    Progress Note Due on Visit 10    OT Start Time 1016    OT Stop Time 1100    OT Time Calculation (min) 44 min    Activity Tolerance Patient tolerated treatment well    Behavior During Therapy [facility] for tasks assessed/performed             Past Medical History:  Diagnosis Date   Asthma    Chronic sinusitis    History of CVA (cerebrovascular accident) 2011   hemorrhagic   HTN (hypertension)    Hyperlipidemia    Nasal polyps    Pneumonia     Past Surgical History:  Procedure Laterality Date   NASAL POLYP EXCISION     x2   TONSILLECTOMY      There were no vitals filed for this visit.   Subjective Assessment - 03/04/21 1023     Subjective  Pt denies any pain.    Pertinent History HTN, Parietal Parenchymal ICH (2011), HLD, Lung Nodules, Hepatic Cirrhosis, BPH, Chronic Hypoxic Respiratory Failure    Patient Stated Goals "make sure everything works correctly"    Currently in Pain? No/denies                  Treatment: environmental scanning with a cognitive component to locate items in sequential order, 4/13 missed on first pass, min cues to loate these items, some of which were on right or on a busy background, increased time required. Matching clock faces with digital time, pt used organized scanning pattern, only occasional min difficulty/ v.c Pt completed much more  quickly today.                  OT Short Term Goals - 03/02/21 1018       OT SHORT TERM GOAL #1   Title Pt will be independent with HEP    Time 4    Period Weeks    Status Achieved    Target Date 03/02/21      OT SHORT TERM GOAL #2   Title Pt will perform environmental scanning with 90% accuracy or greater    Time 4    Period Weeks    Status Achieved      OT SHORT TERM GOAL #3   Title Pt will verbalize understanding of visual strategies for increasing independence and safey with ADLs and IADLs    Time 4    Period Weeks    Status Achieved      OT SHORT TERM GOAL #4   Title Pt will increase coordination in RUE to completing 9 hole peg test in 45 seconds or less    Baseline R 49.15s, L 30.88s    Time 4    Period Weeks    Status Achieved   RUE 35.25s 03/02/21  OT Long Term Goals - 03/02/21 1021       OT LONG TERM GOAL #1   Title Pt will be independent with any updated HEPs    Time 8    Period Weeks    Status New      OT LONG TERM GOAL #2   Title Pt will perform environmental scanning with cognitive component or physical/cognitive task simultaneously with 90% accuracy or greater.    Time 8    Period Weeks    Status New      OT LONG TERM GOAL #3   Title Pt will improve 9 hole peg test with RUE in 40 seconds or less in order to increase fine motor coordination.    Baseline R 49.15s, L 30.88s    Time 8    Period Weeks    Status Achieved   RUE 35.25s on 03/02/21     OT LONG TERM GOAL #4   Title Pt will demonstrate ability to complete Trail Making Test B with 80% accuracy or greater    Baseline eliminated numbers and only did letters at eval    Time 8    Period Weeks    Status New                   Plan - 03/04/21 1337     Clinical Impression Statement Pt is progressing towards goals. He demonstrated improved scanning overall, and he is able to state visual compensation strategies..    OT Occupational Profile and  History Problem Focused Assessment - Including review of records relating to presenting problem    Occupational performance deficits (Please refer to evaluation for details): ADL's;IADL's;Leisure    Body Structure / Function / Physical Skills ADL;Decreased knowledge of use of DME;Dexterity;Flexibility;FMC;Vision;Endurance;IADL;ROM;UE functional use    Cognitive Skills Attention;Problem Solve;Safety Awareness;Sequencing    Rehab Potential Good    Clinical Decision Making Limited treatment options, no task modification necessary    Comorbidities Affecting Occupational Performance: None    Modification or Assistance to Complete Evaluation  No modification of tasks or assist necessary to complete eval    OT Frequency 2x / week    OT Duration 8 weeks    OT Treatment/Interventions Patient/family education;Neuromuscular education;Functional Mobility Training;Visual/perceptual remediation/compensation;Cognitive remediation/compensation;Therapeutic exercise;Therapeutic activities;DME and/or AE instruction;Self-care/ADL training    Plan continue to work towards goals, , cognitve/ visual perceptual skills, alternating attention    Consulted and Agree with Plan of Care Patient;Family member/caregiver    Family Member Consulted spouse, [name]             Patient will benefit from skilled therapeutic intervention in order to improve the following deficits and impairments:   Body Structure / Function / Physical Skills: ADL, Decreased knowledge of use of DME, Dexterity, Flexibility, FMC, Vision, Endurance, IADL, ROM, UE functional use Cognitive Skills: Attention, Problem Solve, Safety Awareness, Sequencing     Visit Diagnosis: Muscle weakness (generalized)  Other lack of coordination  Attention and concentration deficit  Visuospatial deficit  Frontal lobe and executive function deficit    Problem List Patient Active Problem List   Diagnosis Date Noted   Intraparenchymal hemorrhage of  brain ([place]) 01/17/2021   Hemorrhagic stroke ([place]) 01/11/2021   ICH (intracerebral hemorrhage) ([place]) 01/11/2021   Cellulitis of left hand excluding fingers and thumb 12/09/2018   Osteoporosis without current pathological fracture 06/19/2018   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   Allergic asthma, moderate persistent, uncomplicated 25/95/6387   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[place]) 02/08/2016   Cardiomyopathy (  [place]) 01/22/2016   Multiple lung nodules on CT 12/14/2015   Nonspecific abnormal electrocardiogram (ECG) (EKG) 12/14/2015   Hepatic cirrhosis ([place]) 09/24/2015   Coronary artery disease involving native coronary artery of native heart without angina pectoris 09/24/2015   Hyperglycemia 06/08/2015   Screen for colon cancer 07/08/2014   Allergy to statin medication 10/23/2012   BPH associated with nocturia 04/18/2012   Routine general medical examination at a health care facility 04/18/2012   Hyperlipidemia with target LDL less than 70 04/27/2010   Essential hypertension 04/27/2010   NASAL POLYP 11/06/2009   RHINOSINUSITIS, CHRONIC 02/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

## 2021-03-07 NOTE — Addendum Note (Signed)
Addended by: Arliss Journey on: 03/07/2021 02:44 PM   Modules accepted: Orders

## 2021-03-08 ENCOUNTER — Ambulatory Visit: Payer: Medicare Other | Admitting: Physical Therapy

## 2021-03-08 ENCOUNTER — Other Ambulatory Visit: Payer: Self-pay

## 2021-03-08 ENCOUNTER — Ambulatory Visit: Payer: Medicare Other

## 2021-03-08 ENCOUNTER — Ambulatory Visit: Payer: Medicare Other | Admitting: Occupational Therapy

## 2021-03-08 ENCOUNTER — Encounter: Payer: Self-pay | Admitting: Occupational Therapy

## 2021-03-08 ENCOUNTER — Encounter: Payer: Self-pay | Admitting: Physical Therapy

## 2021-03-08 DIAGNOSIS — R41842 Visuospatial deficit: Secondary | ICD-10-CM

## 2021-03-08 DIAGNOSIS — R41844 Frontal lobe and executive function deficit: Secondary | ICD-10-CM

## 2021-03-08 DIAGNOSIS — R41841 Cognitive communication deficit: Secondary | ICD-10-CM

## 2021-03-08 DIAGNOSIS — R4701 Aphasia: Secondary | ICD-10-CM

## 2021-03-08 DIAGNOSIS — R2681 Unsteadiness on feet: Secondary | ICD-10-CM

## 2021-03-08 DIAGNOSIS — M6281 Muscle weakness (generalized): Secondary | ICD-10-CM

## 2021-03-08 DIAGNOSIS — R293 Abnormal posture: Secondary | ICD-10-CM

## 2021-03-08 DIAGNOSIS — R278 Other lack of coordination: Secondary | ICD-10-CM

## 2021-03-08 DIAGNOSIS — R41840 Attention and concentration deficit: Secondary | ICD-10-CM

## 2021-03-08 NOTE — Therapy (Signed)
[facility] [address] [place], [place], [address] Phone: [phone]   Fax:  [phone]  Occupational Therapy Treatment  Patient Details  Name: Lee Blevins MRN: [id number] Date of Birth: February 07, 1946 Referring Provider (OT): [facility] f/u [name]   Encounter Date: 03/08/2021   OT End of Session - 03/08/21 6962     Visit Number 8    Number of Visits 17    Date for OT Re-Evaluation 03/30/21    Authorization Type Medicare A & B/UHC    Authorization Time Period Follow Medicare Guidelines    Authorization - Visit Number 8    Progress Note Due on Visit 10    OT Start Time 9528    OT Stop Time 4132    OT Time Calculation (min) 43 min    Activity Tolerance Patient tolerated treatment well    Behavior During Therapy [facility] for tasks assessed/performed             Past Medical History:  Diagnosis Date   Asthma    Chronic sinusitis    History of CVA (cerebrovascular accident) 2011   hemorrhagic   HTN (hypertension)    Hyperlipidemia    Nasal polyps    Pneumonia     Past Surgical History:  Procedure Laterality Date   NASAL POLYP EXCISION     x2   TONSILLECTOMY      There were no vitals filed for this visit.   Subjective Assessment - 03/08/21 1402     Subjective  "I stepped on the pillows and stuff"    Pertinent History HTN, Parietal Parenchymal ICH (2011), HLD, Lung Nodules, Hepatic Cirrhosis, BPH, Chronic Hypoxic Respiratory Failure    Patient Stated Goals "make sure everything works correctly"    Currently in Pain? No/denies    Pain Score 0-No pain              PVC Pipe Tree separated pieces first with placing all pieces on left side of towel. Pt was able to follow Fig 6 with 2 errors - req'd mod cues for correcting errors.   12 pc puzzle with initial cues for scanning to right as patient was looking at image to left and not at the correct image. Pt completed puzzle with increased time and no  additional cueing.   Word Search words across only at first and with good accuracy and time. Pt had increased difficulty with vertical words but did well with scanning to the right.                     OT Short Term Goals - 03/02/21 1018       OT SHORT TERM GOAL #1   Title Pt will be independent with HEP    Time 4    Period Weeks    Status Achieved    Target Date 03/02/21      OT SHORT TERM GOAL #2   Title Pt will perform environmental scanning with 90% accuracy or greater    Time 4    Period Weeks    Status Achieved      OT SHORT TERM GOAL #3   Title Pt will verbalize understanding of visual strategies for increasing independence and safey with ADLs and IADLs    Time 4    Period Weeks    Status Achieved      OT SHORT TERM GOAL #4   Title Pt will increase coordination in RUE to completing 9 hole peg  test in 45 seconds or less    Baseline R 49.15s, L 30.88s    Time 4    Period Weeks    Status Achieved   RUE 35.25s 03/02/21              OT Long Term Goals - 03/02/21 1021       OT LONG TERM GOAL #1   Title Pt will be independent with any updated HEPs    Time 8    Period Weeks    Status New      OT LONG TERM GOAL #2   Title Pt will perform environmental scanning with cognitive component or physical/cognitive task simultaneously with 90% accuracy or greater.    Time 8    Period Weeks    Status New      OT LONG TERM GOAL #3   Title Pt will improve 9 hole peg test with RUE in 40 seconds or less in order to increase fine motor coordination.    Baseline R 49.15s, L 30.88s    Time 8    Period Weeks    Status Achieved   RUE 35.25s on 03/02/21     OT LONG TERM GOAL #4   Title Pt will demonstrate ability to complete Trail Making Test B with 80% accuracy or greater    Baseline eliminated numbers and only did letters at eval    Time [place] - 03/08/21 1551     Clinical Impression  Statement Pt is progressing well and demonstrates increased ability to scan to right. Increased ability to complete PVC pipe tree today.    OT Occupational Profile and History Problem Focused Assessment - Including review of records relating to presenting problem    Occupational performance deficits (Please refer to evaluation for details): ADL's;IADL's;Leisure    Body Structure / Function / Physical Skills ADL;Decreased knowledge of use of DME;Dexterity;Flexibility;FMC;Vision;Endurance;IADL;ROM;UE functional use    Cognitive Skills Attention;Problem Solve;Safety Awareness;Sequencing    Rehab Potential Good    Clinical Decision Making Limited treatment options, no task modification necessary    Comorbidities Affecting Occupational Performance: None    Modification or Assistance to Complete Evaluation  No modification of tasks or assist necessary to complete eval    OT Frequency 2x / week    OT Duration 8 weeks    OT Treatment/Interventions Patient/family education;Neuromuscular education;Functional Mobility Training;Visual/perceptual remediation/compensation;Cognitive remediation/compensation;Therapeutic exercise;Therapeutic activities;DME and/or AE instruction;Self-care/ADL training    Plan continue to work towards goals, , cognitve/ visual perceptual skills, alternating attention    Consulted and Agree with Plan of Care Patient;Family member/caregiver    Family Member Consulted spouse, [name]             Patient will benefit from skilled therapeutic intervention in order to improve the following deficits and impairments:   Body Structure / Function / Physical Skills: ADL, Decreased knowledge of use of DME, Dexterity, Flexibility, FMC, Vision, Endurance, IADL, ROM, UE functional use Cognitive Skills: Attention, Problem Solve, Safety Awareness, Sequencing     Visit Diagnosis: Muscle weakness (generalized)  Unsteadiness on feet  Other lack of coordination  Attention and  concentration deficit  Frontal lobe and executive function deficit  Visuospatial deficit    Problem List Patient Active Problem List   Diagnosis Date Noted   Intraparenchymal hemorrhage of brain ([place]) 01/17/2021   Hemorrhagic stroke (  [place]) 01/11/2021   ICH (intracerebral hemorrhage) ([place]) 01/11/2021   Cellulitis of left hand excluding fingers and thumb 12/09/2018   Osteoporosis without current pathological fracture 06/19/2018   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   Allergic asthma, moderate persistent, uncomplicated 59/16/3846   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   Cardiomyopathy ([place]) 01/22/2016   Multiple lung nodules on CT 12/14/2015   Nonspecific abnormal electrocardiogram (ECG) (EKG) 12/14/2015   Hepatic cirrhosis ([place]) 09/24/2015   Coronary artery disease involving native coronary artery of native heart without angina pectoris 09/24/2015   Hyperglycemia 06/08/2015   Screen for colon cancer 07/08/2014   Allergy to statin medication 10/23/2012   BPH associated with nocturia 04/18/2012   Routine general medical examination at a health care facility 04/18/2012   Hyperlipidemia with target LDL less than 70 04/27/2010   Essential hypertension 04/27/2010   NASAL POLYP 11/06/2009   RHINOSINUSITIS, CHRONIC 02/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

## 2021-03-08 NOTE — Therapy (Signed)
[facility] [address] [place], [place], [address] Phone: [phone]   Fax:  [phone]  Physical Therapy Treatment  Patient Details  Name: Lee Blevins MRN: [id number] Date of Birth: 09-22-1945 Referring Provider (PT): [place], [place]   Encounter Date: 03/08/2021   PT End of Session - 03/08/21 1415     Visit Number 8    Number of Visits 15    Date for PT Re-Evaluation 89/16/94   per re-cert on 50/38/88   Authorization Type Medicare, UHC (10th visit progress note)    Progress Note Due on Visit 10    PT Start Time 1319    PT Stop Time 2800    PT Time Calculation (min) 39 min    Equipment Utilized During Treatment Gait belt    Activity Tolerance Patient tolerated treatment well    Behavior During Therapy [facility] for tasks assessed/performed             Past Medical History:  Diagnosis Date   Asthma    Chronic sinusitis    History of CVA (cerebrovascular accident) 2011   hemorrhagic   HTN (hypertension)    Hyperlipidemia    Nasal polyps    Pneumonia     Past Surgical History:  Procedure Laterality Date   NASAL POLYP EXCISION     x2   TONSILLECTOMY      There were no vitals filed for this visit.   Subjective Assessment - 03/08/21 1321     Subjective No new complaints, falls, or pain today.    Patient is accompained by: Family member   [name]   Pertinent History hemorrhagic CVA, HTN, HLD, asthma    Limitations Walking;Standing    How long can you stand comfortably? 30 mins    How long can you walk comfortably? 30 mins    Patient Stated Goals "To get back to as normal as I can"    Currently in Pain? No/denies    Pain Score 0-No pain                [facility] Adult PT Treatment/Exercise - 03/08/21 1322       Transfers   Transfers Sit to Stand;Stand to Sit;Floor to Transfer    Sit to Stand 6: Modified independent (Device/Increase time)    Sit to Stand Details (indicate cue type  and reason) --    Stand to Sit 6: Modified independent (Device/Increase time)    Floor to Transfer 6: Modified independent (Device/Increase time)   Pt performed transfer to/from floor with Mod I, with no use of mat table or other device. Increased time to get back up to standing with no external assist/device.      Ambulation/Gait   Ambulation/Gait Yes    Ambulation/Gait Assistance 5: Supervision   Assistive device None    Gait Pattern Step-through pattern;Trunk flexed    Ambulation Surface Level;Indoor               Balance Exercises - 03/08/21 1357       Balance Exercises: Standing   Tandem Stance Foam/compliant surface;Eyes open;Limitations;Other reps (comment)   Pt performed modified tandem stance in corner on pillows with EO with R LE forward, and with EC with L LE forward. 3 x 30 sec each. Pt required min guard to perform, cues for LE placement. The pt demonstrated increased LOB with EC. Added EO stance to HEP   Rockerboard Anterior/posterior;Lateral;EO;EC;30 seconds;Other reps (comment);Intermittent UE support;Limitations    Rockerboard Limitations  performed both ways on balance board with occasional touch to chair/wall for balance assitance: rocking board with emphasis on tall posture with EO, progressing to EC with min guard to min assist; then holding the board steady for EC 30 sec's x 3 reps with min guard to min assist for balance.    Sit to Stand Without upper extremity support;Foam/compliant surface;Limitations;Other (comment)    Sit to Stand Limitations The pt performed 10 reps each sit<>stands with standard stance on airex, then with staggered stance with each foot forward on airex. The pt required min guard to complete, demonstrating no LOB.            Access Code: [email or web address] URL: [email or web address] Date: 03/08/2021 Prepared by: Lee Blevins  Exercises Tandem Walking with Counter Support - 1 x daily - 5 x weekly - 1 sets - 3 reps Toe Walking with  Counter Support - 1 x daily - 5 x weekly - 1 sets - 3 reps Heel Walking with Counter Support - 1 x daily - 5 x weekly - 1 sets - 3-4 reps Standing Near Stance in Corner with Eyes Closed - 1 x daily - 5 x weekly - 1 sets - 3 reps - 30 hold Standing Balance in Corner with Eyes Closed - 1 x daily - 5 x weekly - 1 sets - 10 reps  Added to HEP: Tandem Stance in Corner - 1 x daily - 5 x weekly - 1 sets - 3 reps - 30 hold     PT Education - 03/08/21 1411     Education Details Had pt perform modified tandem stance in corner on pillows, and added same to HEP    Person(s) Educated Patient;Spouse    Methods Explanation;Demonstration;Verbal cues;Handout    Comprehension Verbalized understanding;Returned demonstration              PT Short Term Goals - 02/02/21 1324       PT SHORT TERM GOAL #1   Title =LTGS               PT Long Term Goals - 03/07/21 1440       PT LONG TERM GOAL #1   Title Pt will be IND with final HEP in order to indicate improved functional mobility and balance. ALL LTGS DUE 04/04/21    Baseline met, but will continue to benefit from updates/reivisons.    Time 4    Period Weeks    Status On-going    Target Date 04/04/21      PT LONG TERM GOAL #2   Title Pt will improve FGA to >/=26/30 in order to indicate dec fall risk    Baseline 03/02/2021 22/30    Time 4    Period Weeks    Status Revised      PT LONG TERM GOAL #3   Title Pt will negotiate up/down 4 steps in reciprocal pattern without rail at independent level in order to indicate improved mobility in home/community.    Baseline needs single handrail.    Time 4    Period Weeks    Status On-going      PT LONG TERM GOAL #4   Title Pt will perform SLS on both legs for at least 5 seconds in order to demo improved SLS for IADLs and gait.    Baseline 1-2 seconds each leg.    Time 4    Period Weeks    Status New  Plan - 03/08/21 1416     Clinical Impression Statement Today's  skilled session was focused on assessing floor transfers, sit<>stands on compliant surfaces, and balance activites on compliant/unstable surfaces. The pt performed all interventions with no issues and rest breaks as needed. The pt could continue to benefit from further skilled PT to address functional deficits.    Personal Factors and Comorbidities Comorbidity 3+;Age    Comorbidities see above    Examination-Activity Limitations Locomotion Level;Lift;Transfers;Stand;Stairs;Squat    Examination-Participation Restrictions Driving;Community Activity    Stability/Clinical Decision Making Evolving/Moderate complexity    Rehab Potential Good    PT Frequency 2x / week    PT Duration 4 weeks    PT Treatment/Interventions ADLs/Self Care Home Management;Gait training;Stair training;Functional mobility training;Therapeutic activities;Therapeutic exercise;Balance training;Neuromuscular re-education;Cognitive remediation;Patient/family education;Vestibular;Visual/perceptual remediation/compensation;Passive range of motion    PT Next Visit Plan f/u on advanced corner exercises.  continue to address high level balance, endurance, work on outdoor gait, floor transfer, compliant surfaces, EO/EC, narrow BOS, SLS tasks.    PT Home Exercise Plan Access Code: [email or web address]    Consulted and Agree with Plan of Care Patient;Family member/caregiver    Family Member Consulted wife             Patient will benefit from skilled therapeutic intervention in order to improve the following deficits and impairments:  Decreased activity tolerance, Decreased balance, Decreased cognition, Decreased coordination, Decreased endurance, Postural dysfunction, Impaired vision/preception, Decreased strength  Visit Diagnosis: Muscle weakness (generalized)  Unsteadiness on feet  Abnormal posture     Problem List Patient Active Problem List   Diagnosis Date Noted   Intraparenchymal hemorrhage of brain ([place]) 01/17/2021    Hemorrhagic stroke ([place]) 01/11/2021   ICH (intracerebral hemorrhage) ([place]) 01/11/2021   Cellulitis of left hand excluding fingers and thumb 12/09/2018   Osteoporosis without current pathological fracture 06/19/2018   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   Allergic asthma, moderate persistent, uncomplicated 14/70/9295   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   Cardiomyopathy ([place]) 01/22/2016   Multiple lung nodules on CT 12/14/2015   Nonspecific abnormal electrocardiogram (ECG) (EKG) 12/14/2015   Hepatic cirrhosis ([place]) 09/24/2015   Coronary artery disease involving native coronary artery of native heart without angina pectoris 09/24/2015   Hyperglycemia 06/08/2015   Screen for colon cancer 07/08/2014   Allergy to statin medication 10/23/2012   BPH associated with nocturia 04/18/2012   Routine general medical examination at a health care facility 04/18/2012   Hyperlipidemia with target LDL less than 70 04/27/2010   Essential hypertension 04/27/2010   NASAL POLYP 11/06/2009   RHINOSINUSITIS, CHRONIC 02/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   Lee Blevins, PTA, [facility] [address] [place], [place] [address] [phone] 03/08/21, 4:40 PM

## 2021-03-08 NOTE — Patient Instructions (Signed)
Access Code: [email or web address] URL: [email or web address] Date: 03/08/2021 Prepared by: Willow Ora  Exercises Tandem Walking with Counter Support - 1 x daily - 5 x weekly - 1 sets - 3 reps Toe Walking with Counter Support - 1 x daily - 5 x weekly - 1 sets - 3 reps Heel Walking with Counter Support - 1 x daily - 5 x weekly - 1 sets - 3-4 reps Standing Near Stance in Corner with Eyes Closed - 1 x daily - 5 x weekly - 1 sets - 3 reps - 30 hold Standing Balance in Corner with Eyes Closed - 1 x daily - 5 x weekly - 1 sets - 10 reps  Added to HEP: Tandem Stance in Corner - 1 x daily - 5 x weekly - 1 sets - 3 reps - 30 hold

## 2021-03-09 ENCOUNTER — Ambulatory Visit: Payer: Medicare Other | Admitting: Physical Therapy

## 2021-03-09 ENCOUNTER — Ambulatory Visit: Payer: Medicare Other | Admitting: Occupational Therapy

## 2021-03-09 ENCOUNTER — Encounter: Payer: Self-pay | Admitting: Occupational Therapy

## 2021-03-09 ENCOUNTER — Encounter: Payer: Self-pay | Admitting: Physical Therapy

## 2021-03-09 ENCOUNTER — Encounter: Payer: Self-pay | Admitting: Speech Pathology

## 2021-03-09 ENCOUNTER — Ambulatory Visit: Payer: Medicare Other | Admitting: Speech Pathology

## 2021-03-09 VITALS — BP 163/84 | HR 69

## 2021-03-09 DIAGNOSIS — M6281 Muscle weakness (generalized): Secondary | ICD-10-CM

## 2021-03-09 DIAGNOSIS — R41840 Attention and concentration deficit: Secondary | ICD-10-CM

## 2021-03-09 DIAGNOSIS — R2681 Unsteadiness on feet: Secondary | ICD-10-CM

## 2021-03-09 DIAGNOSIS — R41844 Frontal lobe and executive function deficit: Secondary | ICD-10-CM

## 2021-03-09 DIAGNOSIS — R4701 Aphasia: Secondary | ICD-10-CM | POA: Diagnosis not present

## 2021-03-09 DIAGNOSIS — R41841 Cognitive communication deficit: Secondary | ICD-10-CM

## 2021-03-09 DIAGNOSIS — R278 Other lack of coordination: Secondary | ICD-10-CM

## 2021-03-09 DIAGNOSIS — R293 Abnormal posture: Secondary | ICD-10-CM

## 2021-03-09 DIAGNOSIS — R41842 Visuospatial deficit: Secondary | ICD-10-CM

## 2021-03-09 NOTE — Therapy (Signed)
[facility] [address] [place], [place], [address] Phone: [phone]   Fax:  [phone]  Speech Language Pathology Treatment  Patient Details  Name: Lee Blevins MRN: [id number] Date of Birth: May 29, 1945 Referring Provider (SLP): Cathlyn Parsons PA-C   Encounter Date: 03/08/2021   End of Session - 03/08/21 1441     Visit Number 6    Number of Visits 17    Date for SLP Re-Evaluation 04/05/21    SLP Start Time 1447    SLP Stop Time  4536    SLP Time Calculation (min) 43 min    Activity Tolerance Patient tolerated treatment well             Past Medical History:  Diagnosis Date   Asthma    Chronic sinusitis    History of CVA (cerebrovascular accident) 2011   hemorrhagic   HTN (hypertension)    Hyperlipidemia    Nasal polyps    Pneumonia     Past Surgical History:  Procedure Laterality Date   NASAL POLYP EXCISION     x2   TONSILLECTOMY      There were no vitals filed for this visit.   Subjective Assessment - 03/08/21 1447     Subjective "kind of slow"    Currently in Pain? No/denies                   ADULT SLP TREATMENT - 03/08/21 1436       General Information   Behavior/Cognition Alert;Cooperative;Requires cueing      Treatment Provided   Treatment provided Cognitive-Linquistic      Cognitive-Linquistic Treatment   Treatment focused on Cognition    Skilled Treatment Pt/wife did not recall homework from last ST session. Pt able to recall cognitive strategy x1 to use lists. Pt appropriately stated "give me a hint." Pt identified use of brain breaks with mod verbal cues. Pt is reportedly using brain breaks but endorsed he can work up to 1 hour without difficulty. Pt denied trouble with attention, problem solving, or recall. SLP requested patient sequence daily home tasks, in which pt noted to miss key components such as meals and dressing for example. Min verbal prompting  needed to identify more daily events. SLP targeted deductive reasoning puzzles to assess reading comprehension and problem solving. Intermittent min verbal prompting required to aid attention and processing as pt initially noted to miss steps and/or key components. Pt benefited from re-reading information to aid comprehension and accuracy.      Assessment / Recommendations / Plan   Plan Continue with current plan of care      Progression Toward Goals   Progression toward goals Progressing toward goals              SLP Education - 03/09/21 0745     Education Details review of targeted strategies    Person(s) Educated Patient;Spouse    Methods Explanation;Handout;Verbal cues    Comprehension Verbalized understanding;Need further instruction              SLP Short Term Goals - 03/08/21 1441       SLP SHORT TERM GOAL #1   Title Complete further assessment of expressive language and receptive language.    Baseline Expression: occasional paraphasias in conversation noted (BNT for baseline?); Receptive language: assess yes/no questions, following directions, reading comprehension at sentence/paragraph level *adjust goals as needed    Time 2    Period Weeks    Status  Achieved    Target Date 02/17/21      SLP SHORT TERM GOAL #2   Title Patient will read and follow multiple- step directions to increase working memory/ comprehension of written information with 80% accuracy given minA across 3 sessions.    Time 2    Period Weeks    Status On-going   STGs goals extended due to delay in therapy   Target Date 03/06/21      SLP SHORT TERM GOAL #3   Title Pt wife/caregiver will identify 3+ strategies to aid pt's memory/comprehension of caregiver provided verbal information independently across 3 sessions.    Time 2    Period Weeks    Status Deferred    Target Date 03/06/21      SLP SHORT TERM GOAL #4   Title Pt will recall and demonstrate 3 memory strategies to increase recall of  important information at home independently across 2 sessions.    Time 2    Period Weeks    Status On-going    Target Date 03/06/21      SLP SHORT TERM GOAL #5   Title Pt will recall and demonstrate 3 attention strategies to increase recall of important information at home independently across 2 sessions.    Time 2    Period Weeks    Status On-going    Target Date 03/06/21              SLP Long Term Goals - 03/08/21 1442       SLP LONG TERM GOAL #1   Title Patient will read and follow multiple- step directions to increase comprehension of written information with 80% accuracy  across 3 sessions.    Time 8    Period Weeks    Status On-going    Target Date 03/31/21      SLP LONG TERM GOAL #2   Title Pt wife/caregiver will report use of strategies to aid pt's comprehension of caregiver provided verbal information independently across 3 sessions.    Time 8    Period Weeks    Status On-going    Target Date 03/31/21      SLP LONG TERM GOAL #3   Title Pt will report use of 1+ attention strategies to increase recall of important information at home independently.    Time 8    Period Weeks    Status On-going    Target Date 03/31/21      SLP LONG TERM GOAL #4   Title Pt will report use 1+ memory strategies to increase recall of important information at home independently across 2 sessions.    Time 8    Period Weeks    Status On-going    Target Date 03/31/21              Plan - 03/08/21 1441     Clinical Impression Statement See tx note. Pt cont to exhibit decreased awareness of impairments. Pt demonstrated difficulty with thought organization and working memory. Pt with frequent pauses in conversational speech. SLP rec skilled services to address cognition and aphasia to maximize functional communication and decrease instances of communication breakdowns. Cont with current POC.    Speech Therapy Frequency 2x / week    Duration 8 weeks    Treatment/Interventions  Language facilitation;Environmental controls;Cueing hierarchy;SLP instruction and feedback;Compensatory techniques;Functional tasks;Compensatory strategies;Internal/external aids;Multimodal communcation approach;Patient/family education    Potential to Achieve Goals Good    Consulted and Agree with Plan of Care Patient;Family member/caregiver  Family Member Consulted Wife             Patient will benefit from skilled therapeutic intervention in order to improve the following deficits and impairments:   Cognitive communication deficit  Aphasia    Problem List Patient Active Problem List   Diagnosis Date Noted   Intraparenchymal hemorrhage of brain ([facility]) 01/17/2021   Hemorrhagic stroke ([place] [facility]) 01/11/2021   ICH (intracerebral hemorrhage) ([place] [facility]) 01/11/2021   Cellulitis of left hand excluding fingers and thumb 12/09/2018   Osteoporosis without current pathological fracture 06/19/2018   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   Allergic asthma, moderate persistent, uncomplicated 25/08/3974   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[place]) 02/08/2016   Cardiomyopathy ([facility] [place]) 01/22/2016   Multiple lung nodules on CT 12/14/2015   Nonspecific abnormal electrocardiogram (ECG) (EKG) 12/14/2015   Hepatic cirrhosis ([place]) 09/24/2015   Coronary artery disease involving native coronary artery of native heart without angina pectoris 09/24/2015   Hyperglycemia 06/08/2015   Screen for colon cancer 07/08/2014   Allergy to statin medication 10/23/2012   BPH associated with nocturia 04/18/2012   Routine general medical examination at a health care facility 04/18/2012   Hyperlipidemia with target LDL less than 70 04/27/2010   Essential hypertension 04/27/2010   NASAL POLYP 11/06/2009   RHINOSINUSITIS, CHRONIC 02/29/2008    Alinda Deem, CCC-SLP 03/09/2021, 7:49 AM  [facility] [address] [place], [place], [address] Phone:  [phone]   Fax:  [phone]   Name: Lee Blevins MRN: [id number] Date of Birth: 1946-01-03

## 2021-03-09 NOTE — Therapy (Signed)
[facility] [address] [place], [place], [address] Phone: [phone]   Fax:  [phone]  Speech Language Pathology Treatment  Patient Details  Name: Lee Blevins MRN: [id number] Date of Birth: 1946/02/12 Referring Provider (SLP): Cathlyn Parsons PA-C   Encounter Date: 03/09/2021   End of Session - 03/09/21 1530     Visit Number 7    Number of Visits 17    Date for SLP Re-Evaluation 04/05/21    SLP Start Time 5638    SLP Stop Time  7564    SLP Time Calculation (min) 45 min    Activity Tolerance Patient tolerated treatment well             Past Medical History:  Diagnosis Date   Asthma    Chronic sinusitis    History of CVA (cerebrovascular accident) 2011   hemorrhagic   HTN (hypertension)    Hyperlipidemia    Nasal polyps    Pneumonia     Past Surgical History:  Procedure Laterality Date   NASAL POLYP EXCISION     x2   TONSILLECTOMY      There were no vitals filed for this visit.   Subjective Assessment - 03/09/21 1510     Subjective "I think I did pretty good.." when discussing HEP (pt was not able to complete HEP independently)    Currently in Pain? No/denies                   ADULT SLP TREATMENT - 03/09/21 1513       General Information   Behavior/Cognition Alert;Cooperative;Requires cueing      Treatment Provided   Treatment provided Cognitive-Linquistic      Cognitive-Linquistic Treatment   Treatment focused on Cognition    Skilled Treatment SLP f/u on denial of memory deficits. SLP reminded pt that on assessment, he demonstrated difficulty with memory and that we were working torward learning strategies for recall of important information. Pt and wife nodded in agreement. Pt brought HEP. "Make a Schedule" was too difficult for patient to complete independently. SLP assisted pt with completing schedule. He required maxA to complete this task. SLP implemented  strategies re: using pointer finger to follow along and using yellow highlighter to aid visual scanning and/or visual attention to aid comprehension. SLP discussed "previewing" written material and skimming to find most important details (bold print/times) and then reading for detail. This was successful. Pt reported he was "thankful for the help."      Assessment / Recommendations / Plan   Plan Continue with current plan of care      Progression Toward Goals   Progression toward goals Progressing toward goals                SLP Short Term Goals - 03/08/21 1441       SLP SHORT TERM GOAL #1   Title Complete further assessment of expressive language and receptive language.    Baseline Expression: occasional paraphasias in conversation noted (BNT for baseline?); Receptive language: assess yes/no questions, following directions, reading comprehension at sentence/paragraph level *adjust goals as needed    Time 2    Period Weeks    Status Achieved    Target Date 02/17/21      SLP SHORT TERM GOAL #2   Title Patient will read and follow multiple- step directions to increase working memory/ comprehension of written information with 80% accuracy given minA across 3 sessions.    Time 2  Period Weeks    Status On-going   STGs goals extended due to delay in therapy   Target Date 03/06/21      SLP SHORT TERM GOAL #3   Title Pt wife/caregiver will identify 3+ strategies to aid pt's memory/comprehension of caregiver provided verbal information independently across 3 sessions.    Time 2    Period Weeks    Status Deferred    Target Date 03/06/21      SLP SHORT TERM GOAL #4   Title Pt will recall and demonstrate 3 memory strategies to increase recall of important information at home independently across 2 sessions.    Time 2    Period Weeks    Status On-going    Target Date 03/06/21      SLP SHORT TERM GOAL #5   Title Pt will recall and demonstrate 3 attention strategies to increase  recall of important information at home independently across 2 sessions.    Time 2    Period Weeks    Status On-going    Target Date 03/06/21              SLP Long Term Goals - 03/08/21 1442       SLP LONG TERM GOAL #1   Title Patient will read and follow multiple- step directions to increase comprehension of written information with 80% accuracy  across 3 sessions.    Time 8    Period Weeks    Status On-going    Target Date 03/31/21      SLP LONG TERM GOAL #2   Title Pt wife/caregiver will report use of strategies to aid pt's comprehension of caregiver provided verbal information independently across 3 sessions.    Time 8    Period Weeks    Status On-going    Target Date 03/31/21      SLP LONG TERM GOAL #3   Title Pt will report use of 1+ attention strategies to increase recall of important information at home independently.    Time 8    Period Weeks    Status On-going    Target Date 03/31/21      SLP LONG TERM GOAL #4   Title Pt will report use 1+ memory strategies to increase recall of important information at home independently across 2 sessions.    Time 8    Period Weeks    Status On-going    Target Date 03/31/21              Plan - 03/09/21 1530     Clinical Impression Statement See tx note. Pt cont to exhibit decreased awareness of impairments. Pt demonstrated difficulty with thought organization and working memory. Pt with frequent pauses in conversational speech. SLP rec skilled services to address cognition and aphasia to maximize functional communication and decrease instances of communication breakdowns. Cont with current POC.    Speech Therapy Frequency 2x / week    Duration 8 weeks    Treatment/Interventions Language facilitation;Environmental controls;Cueing hierarchy;SLP instruction and feedback;Compensatory techniques;Functional tasks;Compensatory strategies;Internal/external aids;Multimodal communcation approach;Patient/family education     Potential to Achieve Goals Good    Consulted and Agree with Plan of Care Patient;Family member/caregiver    Family Member Consulted Wife             Patient will benefit from skilled therapeutic intervention in order to improve the following deficits and impairments:   Cognitive communication deficit    Problem List Patient Active Problem List   Diagnosis Date Noted  Intraparenchymal hemorrhage of brain ([place]) 01/17/2021   Hemorrhagic stroke ([facility]) 01/11/2021   ICH (intracerebral hemorrhage) ([facility]) 01/11/2021   Cellulitis of left hand excluding fingers and thumb 12/09/2018   Osteoporosis without current pathological fracture 06/19/2018   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   Allergic asthma, moderate persistent, uncomplicated 68/61/6837   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[place]) 02/08/2016   Cardiomyopathy ([place]) 01/22/2016   Multiple lung nodules on CT 12/14/2015   Nonspecific abnormal electrocardiogram (ECG) (EKG) 12/14/2015   Hepatic cirrhosis ([facility] [place]) 09/24/2015   Coronary artery disease involving native coronary artery of native heart without angina pectoris 09/24/2015   Hyperglycemia 06/08/2015   Screen for colon cancer 07/08/2014   Allergy to statin medication 10/23/2012   BPH associated with nocturia 04/18/2012   Routine general medical examination at a health care facility 04/18/2012   Hyperlipidemia with target LDL less than 70 04/27/2010   Essential hypertension 04/27/2010   NASAL POLYP 11/06/2009   RHINOSINUSITIS, CHRONIC 02/29/2008    Verdene Lennert, [place] 03/09/2021, 3:30 PM  [facility] [address] [place], [place], [address] Phone: [phone]   Fax:  [phone]   Name: Lee Blevins MRN: [id number] Date of Birth: 1945-07-16

## 2021-03-09 NOTE — Therapy (Signed)
[facility] [address] [place], [place], [address] Phone: [phone]   Fax:  [phone]  Physical Therapy Treatment  Patient Details  Name: Lee Blevins MRN: [id number] Date of Birth: 1945/05/27 Referring Provider (PT): Mar[place]e [place], [place]   Encounter Date: 03/09/2021   PT End of Session - 03/09/21 1417     Visit Number 9    Number of Visits 15    Date for PT Re-Evaluation 42/35/36   per re-cert on 14/43/15   Authorization Type Medicare, [employer] (10th visit progress note)    Progress Note Due on Visit 10    PT Start Time 1316    PT Stop Time 4008    PT Time Calculation (min) 42 min    Equipment Utilized During Treatment Gait belt    Activity Tolerance Patient tolerated treatment well    Behavior During Therapy WFL for tasks assessed/performed             Past Medical History:  Diagnosis Date   Asthma    Chronic sinusitis    History of CVA (cerebrovascular accident) 2011   hemorrhagic   HTN (hypertension)    Hyperlipidemia    Nasal polyps    Pneumonia     Past Surgical History:  Procedure Laterality Date   NASAL POLYP EXCISION     x2   TONSILLECTOMY      Vitals:   03/09/21 1320  BP: (!) 163/84  Pulse: 69  SpO2: 98%     Subjective Assessment - 03/09/21 1320     Subjective No new complaints, falls, or pain today. "A little tired after yesterday."    Patient is accompained by: Family member   spouse [name]   Pertinent History hemorrhagic CVA, HTN, HLD, asthma    Limitations Walking;Standing    How long can you stand comfortably? 30 mins    How long can you walk comfortably? 30 mins    Patient Stated Goals "To get back to as normal as I can"    Currently in Pain? No/denies    Pain Score 0-No pain               [facility] Adult PT Treatment/Exercise - 03/09/21 1321       Bed Mobility   Bed Mobility --      Transfers   Transfers Sit to Stand;Stand to Sit;Floor to Transfer    Sit to  Stand 6: Modified independent (Device/Increase time)    Stand to Sit 6: Modified independent (Device/Increase time)      Ambulation/Gait   Ambulation/Gait Yes    Ambulation/Gait Assistance 5: Supervision;7: Independent    Assistive device None    Gait Pattern Step-through pattern;Trunk flexed      High Level Balance   High Level Balance Comments performed obsticle course near counter for safety: Walking on red unlevel mat, stepping on balance stones, and forward stepping over hurdles on blue mat. The pt performed 3 x down/back. Pt required min guard/assist to perform and demonstrated instances of hesitation when transitioning to different sections and intermitent LOB when stepping on balance stones.      Exercises   Exercises Knee/Hip      Knee/Hip Exercises: Aerobic   Other Aerobic SciFit at level 3.5 x 8 min for LE strength and activity tolerance.      Knee/Hip Exercises: Standing   Lateral Step Up Both;1 set;10 reps;Hand Hold: 0;Step Height: 6";Limitations    Lateral Step Up Limitations Pt performed lateral step ups on  6" step with no UE support 10 x each side. The pt required min guard and cues for errect posture and slow/controlled movements.    Other Standing Knee Exercises forward and lateral monster walks with Red theraband ~20 ft pathway 1 x down and back.                PT Short Term Goals - 02/02/21 1324       PT SHORT TERM GOAL #1   Title =LTGS               PT Long Term Goals - 03/07/21 1440       PT LONG TERM GOAL #1   Title Pt will be IND with final HEP in order to indicate improved functional mobility and balance. ALL LTGS DUE 04/04/21    Baseline met, but will continue to benefit from updates/reivisons.    Time 4    Period Weeks    Status On-going    Target Date 04/04/21      PT LONG TERM GOAL #2   Title Pt will improve FGA to >/=26/30 in order to indicate dec fall risk    Baseline 03/02/2021 22/30    Time 4    Period Weeks    Status  Revised      PT LONG TERM GOAL #3   Title Pt will negotiate up/down 4 steps in reciprocal pattern without rail at independent level in order to indicate improved mobility in home/community.    Baseline needs single handrail.    Time 4    Period Weeks    Status On-going      PT LONG TERM GOAL #4   Title Pt will perform SLS on both legs for at least 5 seconds in order to demo improved SLS for IADLs and gait.    Baseline 1-2 seconds each leg.    Time 4    Period Weeks    Status New              Plan - 03/09/21 1418     Clinical Impression Statement Today's skilled session attempted to focus on circuit training for endurance/strengthening, however pt deferred due elevated BP per his spouse (was checked at home) On checking prior to session BP WFL with slightly elevated diastolic, however pt continued to defer circuit training.  The session was focused on ther ex for LE strengthening/activity tolerance and high level balance exercises. The pt performed all interventions with no issues noted and rest breaks as needed. The pt could continue to benefit from further skilled PT to address remaining functional deficits.    Personal Factors and Comorbidities Comorbidity 3+;Age    Comorbidities see above    Examination-Activity Limitations Locomotion Level;Lift;Transfers;Stand;Stairs;Squat    Examination-Participation Restrictions Driving;Community Activity    Stability/Clinical Decision Making Evolving/Moderate complexity    Rehab Potential Good    PT Frequency 2x / week    PT Duration 4 weeks    PT Treatment/Interventions ADLs/Self Care Home Management;Gait training;Stair training;Functional mobility training;Therapeutic activities;Therapeutic exercise;Balance training;Neuromuscular re-education;Cognitive remediation;Patient/family education;Vestibular;Visual/perceptual remediation/compensation;Passive range of motion    PT Next Visit Plan f/u on advanced corner exercises. try out high  intensity functional circuit training If pt/spouse agreeable?  continue to address high level balance, endurance, work on outdoor gait, compliant surfaces, EO/EC, narrow BOS, SLS tasks.    PT Home Exercise Plan Access Code: [email or web address]    Consulted and Agree with Plan of Care Patient;Family member/caregiver    Family Member Consulted wife  Patient will benefit from skilled therapeutic intervention in order to improve the following deficits and impairments:  Decreased activity tolerance, Decreased balance, Decreased cognition, Decreased coordination, Decreased endurance, Postural dysfunction, Impaired vision/preception, Decreased strength  Visit Diagnosis: Unsteadiness on feet  Muscle weakness (generalized)  Abnormal posture     Problem List Patient Active Problem List   Diagnosis Date Noted   Intraparenchymal hemorrhage of brain ([facility]) 01/17/2021   Hemorrhagic stroke ([facility]) 01/11/2021   ICH (intracerebral hemorrhage) ([place]) 01/11/2021   Cellulitis of left hand excluding fingers and thumb 12/09/2018   Osteoporosis without current pathological fracture 06/19/2018   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   Allergic asthma, moderate persistent, uncomplicated 03/02/5207   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[place]) 02/08/2016   Cardiomyopathy ([facility]) 01/22/2016   Multiple lung nodules on CT 12/14/2015   Nonspecific abnormal electrocardiogram (ECG) (EKG) 12/14/2015   Hepatic cirrhosis ([facility]) 09/24/2015   Coronary artery disease involving native coronary artery of native heart without angina pectoris 09/24/2015   Hyperglycemia 06/08/2015   Screen for colon cancer 07/08/2014   Allergy to statin medication 10/23/2012   BPH associated with nocturia 04/18/2012   Routine general medical examination at a health care facility 04/18/2012   Hyperlipidemia with target LDL less than 70 04/27/2010   Essential hypertension 04/27/2010   NASAL POLYP 11/06/2009   RHINOSINUSITIS, CHRONIC  02/29/2008    Rondel Baton, SPTA 03/09/2021, 2:26 PM  [facility] [address] [place], [place], [address] Phone: [phone]   Fax:  [phone]  Name: Dewell Monnier MRN: [id number] Date of Birth: 1946/03/29  This note has been reviewed and edited by supervising CI.   Willow Ora, PTA, [facility] [address], [place], [place] [address] [phone] 03/09/21, 11:44 PM

## 2021-03-09 NOTE — Therapy (Signed)
[facility] [address] [place], [place], [address] Phone: [phone]   Fax:  [phone]  Occupational Therapy Treatment  Patient Details  Name: Lee Blevins MRN: [id number] Date of Birth: 09-16-1945 Referring Provider (OT): [place] f/u [name]   Encounter Date: 03/09/2021   OT End of Session - 03/09/21 1359     Visit Number 9    Number of Visits 17    Date for OT Re-Evaluation 03/30/21    Authorization Type Medicare A & B/[employer]    Authorization Time Period Follow Medicare Guidelines    Authorization - Visit Number 9    Progress Note Due on Visit 10    OT Start Time 1400    OT Stop Time 0947    OT Time Calculation (min) 45 min    Activity Tolerance Patient tolerated treatment well    Behavior During Therapy [facility] for tasks assessed/performed             Past Medical History:  Diagnosis Date   Asthma    Chronic sinusitis    History of CVA (cerebrovascular accident) 2011   hemorrhagic   HTN (hypertension)    Hyperlipidemia    Nasal polyps    Pneumonia     Past Surgical History:  Procedure Laterality Date   NASAL POLYP EXCISION     x2   TONSILLECTOMY      There were no vitals filed for this visit.   Subjective Assessment - 03/09/21 1358     Subjective  "from yesterday?"    Pertinent History HTN, Parietal Parenchymal ICH (2011), HLD, Lung Nodules, Hepatic Cirrhosis, BPH, Chronic Hypoxic Respiratory Failure    Patient Stated Goals "make sure everything works correctly"    Currently in Pain? No/denies    Pain Score 0-No pain             Connect the Dots one error but able to self correct  Environmental Scanning with cognitive component (sequential playing cards) 12/13 accuracy = 93%   Constant Therapy Alternating Symbol level 3 with 97% accuracy and 154.42s response time.                        OT Short Term Goals - 03/02/21 1018       OT SHORT TERM GOAL #1    Title Pt will be independent with HEP    Time 4    Period Weeks    Status Achieved    Target Date 03/02/21      OT SHORT TERM GOAL #2   Title Pt will perform environmental scanning with 90% accuracy or greater    Time 4    Period Weeks    Status Achieved      OT SHORT TERM GOAL #3   Title Pt will verbalize understanding of visual strategies for increasing independence and safey with ADLs and IADLs    Time 4    Period Weeks    Status Achieved      OT SHORT TERM GOAL #4   Title Pt will increase coordination in RUE to completing 9 hole peg test in 45 seconds or less    Baseline R 49.15s, L 30.88s    Time 4    Period Weeks    Status Achieved   RUE 35.25s 03/02/21              OT Long Term Goals - 03/09/21 1417       OT LONG TERM  GOAL #1   Title Pt will be independent with any updated HEPs    Time 8    Period Weeks    Status On-going      OT LONG TERM GOAL #2   Title Pt will perform environmental scanning with cognitive component or physical/cognitive task simultaneously with 90% accuracy or greater.    Time 8    Period Weeks    Status On-going   93% with sequential playing cards 03/09/21 check again for consistency     OT LONG TERM GOAL #3   Title Pt will improve 9 hole peg test with RUE in 40 seconds or less in order to increase fine motor coordination.    Baseline R 49.15s, L 30.88s    Time 8    Period Weeks    Status Achieved   RUE 35.25s on 03/02/21     OT LONG TERM GOAL #4   Title Pt will demonstrate ability to complete Trail Making Test B with 80% accuracy or greater    Baseline eliminated numbers and only did letters at eval    Time 8    Period Weeks    Status On-going                   Plan - 03/09/21 1400     Clinical Impression Statement Pt progressing with goals. Pt with increased recall and skills with visual scanning to right.    OT Occupational Profile and History Problem Focused Assessment - Including review of records relating to  presenting problem    Occupational performance deficits (Please refer to evaluation for details): ADL's;IADL's;Leisure    Body Structure / Function / Physical Skills ADL;Decreased knowledge of use of DME;Dexterity;Flexibility;FMC;Vision;Endurance;IADL;ROM;UE functional use    Cognitive Skills Attention;Problem Solve;Safety Awareness;Sequencing    Rehab Potential Good    Clinical Decision Making Limited treatment options, no task modification necessary    Comorbidities Affecting Occupational Performance: None    Modification or Assistance to Complete Evaluation  No modification of tasks or assist necessary to complete eval    OT Frequency 2x / week    OT Duration 8 weeks    OT Treatment/Interventions Patient/family education;Neuromuscular education;Functional Mobility Training;Visual/perceptual remediation/compensation;Cognitive remediation/compensation;Therapeutic exercise;Therapeutic activities;DME and/or AE instruction;Self-care/ADL training    Plan continue to work towards goals, , cognitve/ visual perceptual skills, alternating attention, progress note next session    Consulted and Agree with Plan of Care Patient;Family member/caregiver    Family Member Consulted spouse, [name]             Patient will benefit from skilled therapeutic intervention in order to improve the following deficits and impairments:   Body Structure / Function / Physical Skills: ADL, Decreased knowledge of use of DME, Dexterity, Flexibility, FMC, Vision, Endurance, IADL, ROM, UE functional use Cognitive Skills: Attention, Problem Solve, Safety Awareness, Sequencing     Visit Diagnosis: Other lack of coordination  Attention and concentration deficit  Visuospatial deficit  Frontal lobe and executive function deficit  Unsteadiness on feet  Muscle weakness (generalized)    Problem List Patient Active Problem List   Diagnosis Date Noted   Intraparenchymal hemorrhage of brain ([place]) 01/17/2021    Hemorrhagic stroke ([place]) 01/11/2021   ICH (intracerebral hemorrhage) ([place]) 01/11/2021   Cellulitis of left hand excluding fingers and thumb 12/09/2018   Osteoporosis without current pathological fracture 06/19/2018   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   Allergic asthma, moderate persistent, uncomplicated 34/91/7915   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   Cardiomyopathy ([place])  01/22/2016   Multiple lung nodules on CT 12/14/2015   Nonspecific abnormal electrocardiogram (ECG) (EKG) 12/14/2015   Hepatic cirrhosis ([place]) 09/24/2015   Coronary artery disease involving native coronary artery of native heart without angina pectoris 09/24/2015   Hyperglycemia 06/08/2015   Screen for colon cancer 07/08/2014   Allergy to statin medication 10/23/2012   BPH associated with nocturia 04/18/2012   Routine general medical examination at a health care facility 04/18/2012   Hyperlipidemia with target LDL less than 70 04/27/2010   Essential hypertension 04/27/2010   NASAL POLYP 11/06/2009   RHINOSINUSITIS, CHRONIC 02/29/2008    Zachery Conch, OT/L 03/09/2021, 4:01 PM  [facility] [address] [place], [place], [address] Phone: [phone]   Fax:  [phone]  Name: Lee Blevins MRN: [id number] Date of Birth: 1946-03-08

## 2021-03-16 ENCOUNTER — Encounter: Payer: Self-pay | Admitting: Speech Pathology

## 2021-03-16 ENCOUNTER — Ambulatory Visit: Payer: Medicare Other | Admitting: Speech Pathology

## 2021-03-16 ENCOUNTER — Ambulatory Visit: Payer: Medicare Other | Admitting: Occupational Therapy

## 2021-03-16 ENCOUNTER — Encounter: Payer: Self-pay | Admitting: Physical Therapy

## 2021-03-16 ENCOUNTER — Other Ambulatory Visit: Payer: Self-pay

## 2021-03-16 ENCOUNTER — Ambulatory Visit: Payer: Medicare Other | Admitting: Physical Therapy

## 2021-03-16 DIAGNOSIS — M6281 Muscle weakness (generalized): Secondary | ICD-10-CM

## 2021-03-16 DIAGNOSIS — R2681 Unsteadiness on feet: Secondary | ICD-10-CM

## 2021-03-16 DIAGNOSIS — R41842 Visuospatial deficit: Secondary | ICD-10-CM

## 2021-03-16 DIAGNOSIS — R41841 Cognitive communication deficit: Secondary | ICD-10-CM

## 2021-03-16 DIAGNOSIS — R293 Abnormal posture: Secondary | ICD-10-CM

## 2021-03-16 DIAGNOSIS — R4701 Aphasia: Secondary | ICD-10-CM | POA: Diagnosis not present

## 2021-03-16 DIAGNOSIS — R41840 Attention and concentration deficit: Secondary | ICD-10-CM

## 2021-03-16 DIAGNOSIS — R41844 Frontal lobe and executive function deficit: Secondary | ICD-10-CM

## 2021-03-16 DIAGNOSIS — R278 Other lack of coordination: Secondary | ICD-10-CM

## 2021-03-16 NOTE — Therapy (Signed)
[facility] [address] [place], [place], [address] Phone: [phone]   Fax:  [phone]  Speech Language Pathology Treatment  Patient Details  Name: Lee Blevins MRN: [id number] Date of Birth: [birth year] Referring Provider (SLP): Cathlyn Parsons PA-C   Encounter Date: 03/16/2021   End of Session - 03/16/21 1322     Visit Number 8    Number of Visits 17    Date for SLP Re-Evaluation 04/05/21    SLP Start Time 1415    SLP Stop Time  1455    SLP Time Calculation (min) 40 min    Activity Tolerance Patient tolerated treatment well             Past Medical History:  Diagnosis Date   Asthma    Chronic sinusitis    History of CVA (cerebrovascular accident) 2011   hemorrhagic   HTN (hypertension)    Hyperlipidemia    Nasal polyps    Pneumonia     Past Surgical History:  Procedure Laterality Date   NASAL POLYP EXCISION     x2   TONSILLECTOMY      There were no vitals filed for this visit.   Subjective Assessment - 03/16/21 1320     Subjective "We didn't do much for Thanksgiving."    Currently in Pain? No/denies                   ADULT SLP TREATMENT - 03/16/21 1430       General Information   Behavior/Cognition Alert;Cooperative;Requires cueing      Treatment Provided   Treatment provided Cognitive-Linquistic      Cognitive-Linquistic Treatment   Treatment focused on Cognition    Skilled Treatment Began training of memory strategies (external). Pt reports he uses all of these strategies, but could benefit from using them more consistently. Discussed smart phone use. Pt was unable to navigate wife's cell phone independently. Wife reports she feels like this could be a future goal for him. SLP in agreement. Pt reported he would like to "take a break" from therapies this month. SLP to defer therapy.      Assessment / Recommendations / Plan   Plan Continue with current plan of care       Progression Toward Goals   Progression toward goals Progressing toward goals                SLP Short Term Goals - 03/16/21 1429       SLP SHORT TERM GOAL #1   Title Complete further assessment of expressive language and receptive language.    Baseline Expression: occasional paraphasias in conversation noted (BNT for baseline?); Receptive language: assess yes/no questions, following directions, reading comprehension at sentence/paragraph level *adjust goals as needed    Time 2    Period Weeks    Status Achieved    Target Date 02/17/21      SLP SHORT TERM GOAL #2   Title Patient will read and follow multiple- step directions to increase working memory/ comprehension of written information with 80% accuracy given minA across 3 sessions.    Time 2    Period Weeks    Status Not Met   STGs goals extended due to delay in therapy   Target Date 03/06/21      SLP SHORT TERM GOAL #3   Title Pt wife/caregiver will identify 3+ strategies to aid pt's memory/comprehension of caregiver provided verbal information independently across 3 sessions.  Time 2    Period Weeks    Status Deferred    Target Date 03/06/21      SLP SHORT TERM GOAL #4   Title Pt will recall and demonstrate 3 memory strategies to increase recall of important information at home independently across 2 sessions.    Time 2    Period Weeks    Status Not Met    Target Date 03/06/21      SLP SHORT TERM GOAL #5   Title Pt will recall and demonstrate 3 attention strategies to increase recall of important information at home independently across 2 sessions.    Time 2    Period Weeks    Status Not Met    Target Date 03/06/21              SLP Long Term Goals - 03/08/21 1442       SLP LONG TERM GOAL #1   Title Patient will read and follow multiple- step directions to increase comprehension of written information with 80% accuracy  across 3 sessions.    Time 8    Period Weeks    Status On-going     Target Date 03/31/21      SLP LONG TERM GOAL #2   Title Pt wife/caregiver will report use of strategies to aid pt's comprehension of caregiver provided verbal information independently across 3 sessions.    Time 8    Period Weeks    Status On-going    Target Date 03/31/21      SLP LONG TERM GOAL #3   Title Pt will report use of 1+ attention strategies to increase recall of important information at home independently.    Time 8    Period Weeks    Status On-going    Target Date 03/31/21      SLP LONG TERM GOAL #4   Title Pt will report use 1+ memory strategies to increase recall of important information at home independently across 2 sessions.    Time 8    Period Weeks    Status On-going    Target Date 03/31/21              Plan - 03/16/21 1420     Clinical Impression Statement Pt and wife have decided to defer treatment ~1 month due to plans in December. Pt cont to present with cogntive-linguistic impairments.    Speech Therapy Frequency 2x / week    Duration 8 weeks    Treatment/Interventions Language facilitation;Environmental controls;Cueing hierarchy;SLP instruction and feedback;Compensatory techniques;Functional tasks;Compensatory strategies;Internal/external aids;Multimodal communcation approach;Patient/family education    Potential to Achieve Goals Good    Consulted and Agree with Plan of Care Patient;Family member/caregiver    Family Member Consulted Wife             Patient will benefit from skilled therapeutic intervention in order to improve the following deficits and impairments:   Aphasia  Cognitive communication deficit    Problem List Patient Active Problem List   Diagnosis Date Noted   Intraparenchymal hemorrhage of brain ([place]) 01/17/2021   Hemorrhagic stroke ([place]) 01/11/2021   ICH (intracerebral hemorrhage) ([place]) 01/11/2021   Cellulitis of left hand excluding fingers and thumb 12/09/2018   Osteoporosis without current pathological fracture  06/19/2018   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   Allergic asthma, moderate persistent, uncomplicated 29/56/2130   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   Cardiomyopathy ([place]) 01/22/2016   Multiple lung nodules on CT 12/14/2015   Nonspecific abnormal electrocardiogram (ECG) (EKG)  12/14/2015   Hepatic cirrhosis ([place]) 09/24/2015   Coronary artery disease involving native coronary artery of native heart without angina pectoris 09/24/2015   Hyperglycemia 06/08/2015   Screen for colon cancer 07/08/2014   Allergy to statin medication 10/23/2012   BPH associated with nocturia 04/18/2012   Routine general medical examination at a health care facility 04/18/2012   Hyperlipidemia with target LDL less than 70 04/27/2010   Essential hypertension 04/27/2010   NASAL POLYP 11/06/2009   RHINOSINUSITIS, CHRONIC 02/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 [place], [place], [address] Phone: [phone]   Fax:  [phone]   Name: Lee Blevins MRN: [id number] Date of Birth: 11-Mar-1946

## 2021-03-16 NOTE — Therapy (Addendum)
[facility] [address] [place], [place], [address] Phone: [phone]   Fax:  [phone]  Occupational Therapy Treatment  Patient Details  Name: Lee Blevins MRN: [id number] Date of Birth: 1945-06-11 Referring Provider (OT): [facility] f/u [name]   Encounter Date: 03/16/2021   OT End of Session - 03/16/21 1645     Visit Number 10    Number of Visits 17    Date for OT Re-Evaluation 03/30/21    Authorization Type Medicare A & B/[employer]    Authorization Time Period Follow Medicare Guidelines    Authorization - Visit Number 10    Progress Note Due on Visit 10    OT Start Time 1405    OT Stop Time 2025    OT Time Calculation (min) 40 min    Activity Tolerance Patient tolerated treatment well    Behavior During Therapy [facility] for tasks assessed/performed             Past Medical History:  Diagnosis Date   Asthma    Chronic sinusitis    History of CVA (cerebrovascular accident) 2011   hemorrhagic   HTN (hypertension)    Hyperlipidemia    Nasal polyps    Pneumonia     Past Surgical History:  Procedure Laterality Date   NASAL POLYP EXCISION     x2   TONSILLECTOMY      There were no vitals filed for this visit.   Pain: Pt denies pain         Treatment: Therapist checked progress towards goals as pt requests to take a break until January. Environmental scanning while tossing ball 8/9 located however min v.c to continue tossing ball. Divided attention ambulating while tossing ball and naming animals, min v.c Copying small peg design with increased time, min v.c using RUE. Pt attempted trail making B however he demonstrated max difficulty completing correctly so task was discontinued.               OT Short Term Goals - 03/02/21 1018       OT SHORT TERM GOAL #1   Title Pt will be independent with HEP    Time 4    Period Weeks    Status Achieved    Target Date 03/02/21      OT  SHORT TERM GOAL #2   Title Pt will perform environmental scanning with 90% accuracy or greater    Time 4    Period Weeks    Status Achieved      OT SHORT TERM GOAL #3   Title Pt will verbalize understanding of visual strategies for increasing independence and safey with ADLs and IADLs    Time 4    Period Weeks    Status Achieved      OT SHORT TERM GOAL #4   Title Pt will increase coordination in RUE to completing 9 hole peg test in 45 seconds or less    Baseline R 49.15s, L 30.88s    Time 4    Period Weeks    Status Achieved   RUE 35.25s 03/02/21              OT Long Term Goals - 03/16/21 1408       OT LONG TERM GOAL #1   Title Pt will be independent with any updated HEPs    Status Not Met      OT LONG TERM GOAL #2   Title Pt will perform environmental scanning with  cognitive component or physical/cognitive task simultaneously with 90% accuracy or greater.    Status Not Met   8/9 located while tossing ball, however not fully met due to consistentency     OT LONG TERM GOAL #3   Title Pt will improve 9 hole peg test with RUE in 40 seconds or less in order to increase fine motor coordination.    Status Achieved      OT LONG TERM GOAL #4   Title Pt will demonstrate ability to complete Trail Making Test B with 80% accuracy or greater    Status Not Met                   Plan - 03/16/21 1642     Clinical Impression Statement For the reporting period of 10-19/22-03/16/21 pt has made progress towards goals. He achieved all short term goals and some long term goals. Pt requests to place therapy on hold until January as he will have a lot of family in town for the holidays. Will plan to re-eval in [place].   OT Occupational Profile and History Problem Focused Assessment - Including review of records relating to presenting problem    Occupational performance deficits (Please refer to evaluation for details): ADL's;IADL's;Leisure    Body Structure / Function / Physical  Skills ADL;Decreased knowledge of use of DME;Dexterity;Flexibility;FMC;Vision;Endurance;IADL;ROM;UE functional use    Cognitive Skills Attention;Problem Solve;Safety Awareness;Sequencing    Rehab Potential Good    Clinical Decision Making Limited treatment options, no task modification necessary    Comorbidities Affecting Occupational Performance: None    Modification or Assistance to Complete Evaluation  No modification of tasks or assist necessary to complete eval    OT Frequency 2x / week    OT Duration 8 weeks    OT Treatment/Interventions Patient/family education;Neuromuscular education;Functional Mobility Training;Visual/perceptual remediation/compensation;Cognitive remediation/compensation;Therapeutic exercise;Therapeutic activities;DME and/or AE instruction;Self-care/ADL training    Plan re-eval in January when pt returns to therapy    Consulted and Agree with Plan of Care Patient;Family member/caregiver    Family Member Consulted spouse, [name]             Patient will benefit from skilled therapeutic intervention in order to improve the following deficits and impairments:   Body Structure / Function / Physical Skills: ADL, Decreased knowledge of use of DME, Dexterity, Flexibility, FMC, Vision, Endurance, IADL, ROM, UE functional use Cognitive Skills: Attention, Problem Solve, Safety Awareness, Sequencing     Visit Diagnosis: Muscle weakness (generalized)  Abnormal posture  Other lack of coordination  Attention and concentration deficit  Visuospatial deficit  Frontal lobe and executive function deficit    Problem List Patient Active Problem List   Diagnosis Date Noted   Intraparenchymal hemorrhage of brain ([place]) 01/17/2021   Hemorrhagic stroke ([place]) 01/11/2021   ICH (intracerebral hemorrhage) ([place]) 01/11/2021   Cellulitis of left hand excluding fingers and thumb 12/09/2018   Osteoporosis without current pathological fracture 06/19/2018   Steroid-induced  osteoporosis 03/15/2016   Allergic asthma, moderate persistent, uncomplicated 75/01/2584   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   Cardiomyopathy ([place]) 01/22/2016   Multiple lung nodules on CT 12/14/2015   Nonspecific abnormal electrocardiogram (ECG) (EKG) 12/14/2015   Hepatic cirrhosis ([place]) 09/24/2015   Coronary artery disease involving native coronary artery of native heart without angina pectoris 09/24/2015   Hyperglycemia 06/08/2015   Screen for colon cancer 07/08/2014   Allergy to statin medication 10/23/2012   BPH associated with nocturia 04/18/2012   Routine general medical examination at a  health care facility 04/18/2012   Hyperlipidemia with target LDL less than 70 04/27/2010   Essential hypertension 04/27/2010   NASAL POLYP 11/06/2009   RHINOSINUSITIS, CHRONIC 02/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 [place], [place], [address] Phone: [phone]   Fax:  [phone]  Name: Lee Blevins MRN: [id number] Date of Birth: [birth year]

## 2021-03-16 NOTE — Therapy (Addendum)
[facility] [address] [place], [place], [address] Phone: [phone]   Fax:  [phone]  Physical Therapy Treatment/10th Visit Progress Note  Patient Details  Name: Lee Blevins MRN: [id number] Date of Birth: [birth year] Referring Provider (PT): Mar[place]e [place], [place]  10th Visit Physical Therapy Progress Note  Dates of Reporting Period: 02/02/21 to 03/16/21        Encounter Date: 03/16/2021   PT End of Session - 03/16/21 1551     Visit Number 10    Number of Visits 15    Date for PT Re-Evaluation 99/83/38   per re-cert on 25/05/39   Authorization Type Medicare, [employer] (10th visit progress note)    Progress Note Due on Visit 20   PT Start Time 1446    PT Stop Time 1526    PT Time Calculation (min) 40 min    Equipment Utilized During Treatment Gait belt    Activity Tolerance Patient tolerated treatment well    Behavior During Therapy WFL for tasks assessed/performed             Past Medical History:  Diagnosis Date   Asthma    Chronic sinusitis    History of CVA (cerebrovascular accident) 2011   hemorrhagic   HTN (hypertension)    Hyperlipidemia    Nasal polyps    Pneumonia     Past Surgical History:  Procedure Laterality Date   NASAL POLYP EXCISION     x2   TONSILLECTOMY      There were no vitals filed for this visit.   Subjective Assessment - 03/16/21 1447     Subjective No new complaints, falls, or pain today. Pt reports needing to take a break from therapy in December and will come back in January.    Patient is accompained by: Family member   [name]   Pertinent History hemorrhagic CVA, HTN, HLD, asthma    Limitations Walking;Standing    How long can you stand comfortably? 30 mins    How long can you walk comfortably? 30 mins    Patient Stated Goals "To get back to as normal as I can"    Currently in Pain? No/denies    Pain Score 0-No pain                [facility] Adult  PT Treatment/Exercise - 03/16/21 1449       Bed Mobility   Bed Mobility Sit to Supine;Supine to Sit    Supine to Sit Independent    Sit to Supine Independent      Transfers   Transfers Sit to Stand;Stand to Sit;Floor to Transfer    Sit to Stand 6: Modified independent (Device/Increase time)    Stand to Sit 6: Modified independent (Device/Increase time)      Ambulation/Gait   Ambulation/Gait Yes    Ambulation/Gait Assistance 6: Modified independent (Device/Increase time);7: Independent    Ambulation Distance (Feet) --   throughout session   Assistive device None    Gait Pattern Step-through pattern;Trunk flexed    Ambulation Surface Level;Indoor      Neuro Re-ed    Neuro Re-ed Details  for balance/muscle re-ed/strengthening: sitting on balance disk on the mat performing standard rows, lat pulls, and rows with horizontal abduction with green theraband x 10 each. Pt then performed trunk rotation while sitting on balance disk with 3.3# medicine ball x 10 each side. Seated on the edge of mat: pt performed seated trunk rotation attempting to have UEs  touch x10-15 each side. The pt then performed alternating sitting with UE's out wide then folding forward onto knees, focusing on opening up chest and large amplitude movements. The pt performed 10 x. The pt was very tight/limited during trunk rotation and the therapist performed PROM/stretching in supine to improve rotation. The pt was transitioned to standing in the corner on solid surface: the pt performed reaching across the body with UE's towards targets x 10 each side. The pt demonstrated improved rotation after PROM/stretching rotators on supine.      Exercises   Exercises Knee/Hip;Lumbar      Lumbar Exercises: Stretches   Lower Trunk Rotation Other (comment)    Lower Trunk Rotation Limitations supine on mat: pt positioned with UEs out wide on mat and LE's on red theraball. Therapist performed PROM/Stretching of lower trunk rotation with  LE's supported on ball x 10 each side.      Knee/Hip Exercises: Aerobic   Other Aerobic SciFit at level 3.5-4 x 8 min with LE's only for LE strength and activity tolerance.                 PT Short Term Goals - 02/02/21 1324       PT SHORT TERM GOAL #1   Title =LTGS               PT Long Term Goals - 03/07/21 1440       PT LONG TERM GOAL #1   Title Pt will be IND with final HEP in order to indicate improved functional mobility and balance. ALL LTGS DUE 04/04/21    Baseline met, but will continue to benefit from updates/reivisons.    Time 4    Period Weeks    Status On-going    Target Date 04/04/21      PT LONG TERM GOAL #2   Title Pt will improve FGA to >/=26/30 in order to indicate dec fall risk    Baseline 03/02/2021 22/30    Time 4    Period Weeks    Status Revised      PT LONG TERM GOAL #3   Title Pt will negotiate up/down 4 steps in reciprocal pattern without rail at independent level in order to indicate improved mobility in home/community.    Baseline needs single handrail.    Time 4    Period Weeks    Status On-going      PT LONG TERM GOAL #4   Title Pt will perform SLS on both legs for at least 5 seconds in order to demo improved SLS for IADLs and gait.    Baseline 1-2 seconds each leg.    Time 4    Period Weeks    Status New                   Plan - 03/16/21 1552     Clinical Impression Statement 10th visit PN (from primary PT); goals not assessed today due to re-cert performed on 29/47/65. Pt scored a 22/30 on the FGA, indicating pt is at a moderate fall risk. Pt challenged by SLS tasks, only able to hold it for 1-2 seconds each leg. With the mCTSIB with condition 4, pt with incr postural sway indicating decr vestibular input for balance. Today's skilled session was focused on continued work on SciFit to improve LE strength and endurance. The rest of the session was focused on exercises for impoved posture and trunk rotation. The pt  performed the interventions with no  issues noted, and could continue to benefit from further skilled PT to address functional deficits. The pt is planning to schedule more appointments beginning in January after break from PT.    Personal Factors and Comorbidities Comorbidity 3+;Age    Comorbidities see above    Examination-Activity Limitations Locomotion Level;Lift;Transfers;Stand;Stairs;Squat    Examination-Participation Restrictions Driving;Community Activity    Stability/Clinical Decision Making Evolving/Moderate complexity    Rehab Potential Good    PT Frequency 2x / week    PT Duration 4 weeks    PT Treatment/Interventions ADLs/Self Care Home Management;Gait training;Stair training;Functional mobility training;Therapeutic activities;Therapeutic exercise;Balance training;Neuromuscular re-education;Cognitive remediation;Patient/family education;Vestibular;Visual/perceptual remediation/compensation;Passive range of motion    PT Next Visit Plan Check goals for recert upon return from break in January. f/u on advanced corner exercises. try out high intensity functional circuit training?  continue to address high level balance, endurance, work on outdoor gait, compliant surfaces, EO/EC, narrow BOS, SLS tasks, postural exercises.    PT Home Exercise Plan Access Code: [email or web address]    Consulted and Agree with Plan of Care Patient;Family member/caregiver    Family Member Consulted wife             Patient will benefit from skilled therapeutic intervention in order to improve the following deficits and impairments:  Decreased activity tolerance, Decreased balance, Decreased cognition, Decreased coordination, Decreased endurance, Postural dysfunction, Impaired vision/preception, Decreased strength  Visit Diagnosis: Unsteadiness on feet  Abnormal posture  Muscle weakness (generalized)     Problem List Patient Active Problem List   Diagnosis Date Noted   Intraparenchymal hemorrhage of brain  ([facility]) 01/17/2021   Hemorrhagic stroke ([place]) 01/11/2021   ICH (intracerebral hemorrhage) ([place]) 01/11/2021   Cellulitis of left hand excluding fingers and thumb 12/09/2018   Osteoporosis without current pathological fracture 06/19/2018   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   Allergic asthma, moderate persistent, uncomplicated 57/90/3833   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   Cardiomyopathy ([place]) 01/22/2016   Multiple lung nodules on CT 12/14/2015   Nonspecific abnormal electrocardiogram (ECG) (EKG) 12/14/2015   Hepatic cirrhosis ([place]) 09/24/2015   Coronary artery disease involving native coronary artery of native heart without angina pectoris 09/24/2015   Hyperglycemia 06/08/2015   Screen for colon cancer 07/08/2014   Allergy to statin medication 10/23/2012   BPH associated with nocturia 04/18/2012   Routine general medical examination at a health care facility 04/18/2012   Hyperlipidemia with target LDL less than 70 04/27/2010   Essential hypertension 04/27/2010   NASAL POLYP 11/06/2009   RHINOSINUSITIS, 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 [place], [place], [address] Phone: [phone]   Fax:  [phone]  Name: Lee Blevins MRN: [id number] Date of Birth: [birth year]  Janann August, PT, DPT 03/18/21 10:08 AM

## 2021-03-21 ENCOUNTER — Ambulatory Visit: Payer: Medicare Other | Admitting: Speech Pathology

## 2021-03-21 ENCOUNTER — Ambulatory Visit: Payer: Medicare Other | Admitting: Physical Therapy

## 2021-03-21 ENCOUNTER — Ambulatory Visit: Payer: Medicare Other | Admitting: Occupational Therapy

## 2021-03-23 ENCOUNTER — Encounter: Payer: Medicare Other | Admitting: Occupational Therapy

## 2021-03-25 NOTE — Progress Notes (Deleted)
Subjective:    Patient ID: Lee Blevins, male    DOB: [birth year], 75 y.o.   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 Started cytoxan 02/02/16  ------------------------------------------------------------   03/26/20- 75 year old male former smoker followed for asthma/bronchitis, eosinophilia, lung nodules, chronic sinusitis, nasal polyps/Churg-Strauss/Cytoxan then Imuran, hx chronic hypoxic respiratory failure, complicated by HBP, Myoclonic jerking,  -Advair 250, albuterol hfa, singulair- Covid vax- 2 Phizer Flu vax- had Uses Advair but rarely needs rescue inhaler. Denies symptoms of sinusitis or asthma. Big difference after he was treated with immunosuppression for eosinophilic vasculitis/ Churg-Strauss.  03/28/21- 75 year old male former smoker followed for Asthma/bronchitis, Eosinophilia, lung nodules, chronic sinusitis, nasal polyps/Churg-Strauss/Cytoxan then Imuran, hx chronic hypoxic respiratory failure, complicated by HTN, Myoclonic jerking, CVA/ ICH 01/11/21,  -Advair 250, albuterol hfa, singulair- Covid vax- 2 Phizer Flu vax-  Hosp for [employer] late September. Dr Amil Amen Rheumatology   ROS-see HPI    + = positive Constitutional:   No-   weight loss, night sweats, fevers, chills, fatigue, lassitude. HEENT:   No-  headaches, difficulty swallowing, tooth/dental problems, sore  throat,       No-  sneezing, itching, ear ache, nasal congestion, post nasal drip,  CV:  No-   chest pain, orthopnea, PND, swelling in lower extremities, anasarca, dizziness, palpitations Resp:  shortness of breath with exertion or at rest.              productive cough,  No non-productive cough,  No- coughing up of blood.              No-   change in color of mucus.  No- wheezing.   Skin: No-   rash or lesions. GI:  No-   heartburn, indigestion, abdominal pain, nausea, vomiting,  GU:  MS:  No-   joint pain or swelling.   Neuro-     nothing unusual Psych:  No- change in mood or affect. No depression or anxiety.  No memory loss.  Objective:   Physical Exam General- Alert, Oriented, Affect-appropriate, Distress- none acute, thin Skin- rash-none, lesions- none, excoriation- none Lymphadenopathy- none Head- atraumatic            Eyes- Gross vision intact, PERRLA, conjunctivae clear secretions            Ears- Hearing, canals normal            Nose- Clear, No- Septal dev, mucus -none, polyp-none seen, no-erosion, perforation             Throat- Mallampati II-III , mucosa+mild erythema posterior pharynx, drainage- none, tonsils- atrophic, missing teeth. Neck- flexible , trachea midline, no stridor , thyroid nl, carotid no bruit Chest - symmetrical excursion , unlabored           Heart/CV- RRR , no murmur , no gallop  , no rub, nl s1 s2                           - JVD- none , edema- none, stasis changes-  none, varices- none           Lung-  wheeze-none , cough- none , dullness-none, rub- none, unlabored           Chest wall-  +pacemaker L Abd-  Br/ Gen/ Rectal- Not done, not indicated Extrem- cyanosis- none, clubbing, none, atrophy- none, strength- nl Neuro- +mild myoclonic twitching/ jerking which he tries to minimize  Assessment & Plan:

## 2021-03-28 ENCOUNTER — Ambulatory Visit: Payer: Medicare Other | Admitting: Internal Medicine

## 2021-03-28 ENCOUNTER — Encounter: Payer: Medicare Other | Admitting: Speech Pathology

## 2021-03-28 ENCOUNTER — Encounter: Payer: Medicare Other | Admitting: Occupational Therapy

## 2021-03-29 ENCOUNTER — Telehealth: Payer: Self-pay | Admitting: Internal Medicine

## 2021-03-29 ENCOUNTER — Other Ambulatory Visit: Payer: Self-pay | Admitting: Internal Medicine

## 2021-03-29 MED ORDER — MONTELUKAST SODIUM 10 MG PO TABS: ORAL_TABLET | ORAL | 3 refills | Status: DC

## 2021-03-29 NOTE — Telephone Encounter (Signed)
Rx for pt's montelukast has been sent to preferred pharmacy for pt. Called and spoke with pt's spouse [name] letting her know this had been done and she verbalized understanding. Nothing further needed.

## 2021-03-30 ENCOUNTER — Ambulatory Visit (INDEPENDENT_AMBULATORY_CARE_PROVIDER_SITE_OTHER): Payer: Medicare Other | Admitting: Internal Medicine

## 2021-03-30 ENCOUNTER — Encounter: Payer: Medicare Other | Admitting: Speech Pathology

## 2021-03-30 ENCOUNTER — Encounter: Payer: Medicare Other | Admitting: Occupational Therapy

## 2021-03-30 ENCOUNTER — Other Ambulatory Visit: Payer: Self-pay

## 2021-03-30 ENCOUNTER — Ambulatory Visit: Payer: Medicare Other | Admitting: Physical Therapy

## 2021-03-30 ENCOUNTER — Encounter: Payer: Self-pay | Admitting: Internal Medicine

## 2021-03-30 ENCOUNTER — Ambulatory Visit (INDEPENDENT_AMBULATORY_CARE_PROVIDER_SITE_OTHER): Payer: Medicare Other

## 2021-03-30 VITALS — BP 150/64 | HR 73 | Temp 97.8°F | Ht 72.0 in | Wt 171.6 lb

## 2021-03-30 DIAGNOSIS — I611 Nontraumatic intracerebral hemorrhage in hemisphere, cortical: Secondary | ICD-10-CM

## 2021-03-30 DIAGNOSIS — I639 Cerebral infarction, unspecified: Secondary | ICD-10-CM

## 2021-03-30 DIAGNOSIS — D7218 Eosinophilia in diseases classified elsewhere: Secondary | ICD-10-CM | POA: Diagnosis not present

## 2021-03-30 DIAGNOSIS — M301 Polyarteritis with lung involvement [Churg-Strauss]: Secondary | ICD-10-CM

## 2021-03-30 DIAGNOSIS — J4540 Moderate persistent asthma, uncomplicated: Secondary | ICD-10-CM

## 2021-03-30 NOTE — Patient Instructions (Signed)
Order- CXR   dx Churg-Strauss, chromic immunosuppression  We can continue current meds. I'm glad you feel well.

## 2021-03-30 NOTE — Progress Notes (Signed)
Subjective:    Patient ID: Lee Blevins, male    DOB: [birth year], 75 y.o.   MRN: [id number]  HPI male former smoker followed for asthma/bronchitis, eosinophilia, lung nodules, chronic sinusitis, nasal polyps/Churg-Strauss/Cytoxan, chronic hypoxic respiratory failure, complicated by history CVA, HBP, CAD Barium swallow negative for reflux, incidental small diverticulum Sputum culture from 09/06/2015 had grown Pseudomonas sensitive to Cipro, QuantiFERON gold Neg 2017 Labs 10/08/2015-sputum culture normal flora, mold IgE panel all negative, CF panel negative, elevated IgE for cat, dog, grass, tree pollens Churg-Strauss- Dx by Dr Lee Blevins/ Rheumatology based on eosinophilia, chronic asthma, chronic sinus disease, no path biopsy. Started cytoxan 02/02/16  ------------------------------------------------------------   03/26/20- 75 year old male former smoker followed for asthma/bronchitis, eosinophilia, lung nodules, chronic sinusitis, nasal polyps/Churg-Strauss/Cytoxan then Imuran, hx chronic hypoxic respiratory failure, complicated by HBP, Myoclonic jerking,  -Advair 250, albuterol hfa, singulair- Covid vax- 2 Phizer Flu vax- had Uses Advair but rarely needs rescue inhaler. Denies symptoms of sinusitis or asthma. Big difference after he was treated with immunosuppression for eosinophilic vasculitis/ Churg-Strauss.  03/30/21- 75 year old male former smoker followed for Asthma/bronchitis, Eosinophilia, lung nodules, chronic sinusitis, nasal polyps/Churg-Strauss/Cytoxan then Imuran, hx chronic hypoxic respiratory failure, complicated by HTN, Myoclonic jerking, CVA/ ICH 01/11/21,  -Advair 250, albuterol hfa, singulair, prednisone 5 mg daily chronic maint, Covid vax- 2 Phizer Flu vax- had Hosp for [facility] late September. [name] visual field cut. Wife drives.  Dr Lee Blevins Rheumatology ACT score 25 He denies sinus discomfort or drainage, cough, wheeze, rash, adenopathy or any significant respiratory  symptoms.  ROS-see HPI    + = positive Constitutional:   No-   weight loss, night sweats, fevers, chills, fatigue, lassitude. HEENT:   No-  headaches, difficulty swallowing, tooth/dental problems, sore throat,       No-  sneezing, itching, ear ache, nasal congestion, post nasal drip,  CV:  No-   chest pain, orthopnea, PND, swelling in lower extremities, anasarca, dizziness, palpitations Resp:  shortness of breath with exertion or at rest.              productive cough,  No non-productive cough,  No- coughing up of blood.              No-   change in color of mucus.  No- wheezing.   Skin: No-   rash or lesions. GI:  No-   heartburn, indigestion, abdominal pain, nausea, vomiting,  GU:  MS:  No-   joint pain or swelling.   Neuro-     HPI Psych:  No- change in mood or affect. No depression or anxiety.  No memory loss.  Objective:   Physical Exam General- Alert, Oriented, Affect-appropriate, Distress- none acute, thin Skin- rash-none, lesions- none, excoriation- none Lymphadenopathy- none Head- atraumatic            Eyes- Gross vision intact, PERRLA, conjunctivae clear secretions            Ears- Hearing, canals normal            Nose- Clear, No- Septal dev, mucus -none, polyp-none seen, no-erosion, perforation             Throat- Mallampati II-III , mucosa+mild erythema posterior pharynx, drainage- none, tonsils- atrophic, missing teeth. Neck- flexible , trachea midline, no stridor , thyroid nl, carotid no bruit Chest - symmetrical excursion , unlabored           Heart/CV- RRR , no murmur , no gallop  , no rub, nl s1 s2                           -  JVD- none , edema- none, stasis changes- none, varices- none           Lung-  wheeze-none , cough- none , dullness-none, rub- none, unlabored           Chest wall-  +pacemaker L Abd-  Br/ Gen/ Rectal- Not done, not indicated Extrem- cyanosis- none, clubbing, none, atrophy- none, strength- nl Neuro- +mild myoclonic twitching/ jerking which he  tries to minimize  Assessment & Plan:

## 2021-03-30 NOTE — Assessment & Plan Note (Signed)
He indicates residual right lateral field cut and his wife is doing the driving.

## 2021-03-30 NOTE — Assessment & Plan Note (Signed)
Now stable well-controlled and uncomplicated.  Maintained on low-dose prednisone by Rheumatology.  He is doing very well.  Current respiratory meds are okay with no refills needed.

## 2021-04-04 ENCOUNTER — Encounter: Payer: Medicare Other | Admitting: Occupational Therapy

## 2021-04-04 ENCOUNTER — Ambulatory Visit: Payer: Medicare Other | Admitting: Physical Therapy

## 2021-04-05 ENCOUNTER — Encounter: Payer: Self-pay | Admitting: *Deleted

## 2021-04-06 ENCOUNTER — Encounter: Payer: Medicare Other | Admitting: Occupational Therapy

## 2021-04-06 ENCOUNTER — Ambulatory Visit: Payer: Medicare Other | Admitting: Physical Therapy

## 2021-04-14 ENCOUNTER — Encounter: Payer: Medicare Other | Admitting: Occupational Therapy

## 2021-04-20 ENCOUNTER — Ambulatory Visit: Payer: Medicare Other | Admitting: Occupational Therapy

## 2021-04-20 ENCOUNTER — Ambulatory Visit: Payer: Medicare Other | Admitting: Physical Therapy

## 2021-04-20 ENCOUNTER — Ambulatory Visit: Payer: Medicare Other

## 2021-04-22 ENCOUNTER — Ambulatory Visit: Payer: Medicare Other

## 2021-04-22 ENCOUNTER — Ambulatory Visit: Payer: Medicare Other | Admitting: Physical Therapy

## 2021-04-22 ENCOUNTER — Ambulatory Visit: Payer: Medicare Other | Admitting: Occupational Therapy

## 2021-04-26 ENCOUNTER — Ambulatory Visit: Payer: Medicare Other | Admitting: Physical Therapy

## 2021-04-26 ENCOUNTER — Encounter: Payer: Medicare Other | Admitting: Occupational Therapy

## 2021-04-28 ENCOUNTER — Ambulatory Visit: Payer: Medicare Other | Admitting: Physical Therapy

## 2021-04-28 ENCOUNTER — Encounter: Payer: Medicare Other | Admitting: Occupational Therapy

## 2021-05-11 ENCOUNTER — Telehealth: Payer: Self-pay | Admitting: Adult Health

## 2021-05-11 NOTE — Telephone Encounter (Signed)
Pt's wife called wanting to know if the RN or Provider can call back and let her know if the pt is ok to [facility]. The pt has reservations that need to be confirmed by tomorrow for a flight that is to be on the 1st of March. Please advise.

## 2021-05-11 NOTE — Telephone Encounter (Signed)
As long as he does good by then without any new stroke symptoms. Also would not recommend long flight and not to fly to remote area - important to have medical services close just in case.

## 2021-05-11 NOTE — Telephone Encounter (Signed)
Contacted pt spouse informed her per [place], As long as he does good by then without any new stroke symptoms. Also would not recommend long flight and not to fly to remote area - important to have medical services close just in case.  She understood and was appreciative.

## 2021-05-11 NOTE — Telephone Encounter (Signed)
Do you agree with this pt flying? Please advise

## 2021-06-13 ENCOUNTER — Other Ambulatory Visit (HOSPITAL_COMMUNITY): Payer: Self-pay

## 2021-06-13 ENCOUNTER — Ambulatory Visit: Payer: Medicare Other | Admitting: Physical Medicine and Rehabilitation

## 2021-06-13 ENCOUNTER — Telehealth: Payer: Self-pay | Admitting: Internal Medicine

## 2021-06-13 NOTE — Telephone Encounter (Signed)
PT calls today in regards to the irbesartan (AVAPRO). They have 9 tablets left and would like to get a prescription put in at the [facility] on [address] if possible!  CB: [phone]

## 2021-06-14 ENCOUNTER — Other Ambulatory Visit: Payer: Self-pay | Admitting: Internal Medicine

## 2021-06-14 DIAGNOSIS — I10 Essential (primary) hypertension: Secondary | ICD-10-CM

## 2021-06-14 MED ORDER — IRBESARTAN 150 MG PO TABS: 150.0000 mg | ORAL_TABLET | Freq: Every day | ORAL | 0 refills | Status: DC

## 2021-06-22 ENCOUNTER — Ambulatory Visit: Payer: Medicare Other | Admitting: Adult Health

## 2021-07-01 ENCOUNTER — Other Ambulatory Visit: Payer: Medicare Other

## 2021-07-04 ENCOUNTER — Ambulatory Visit: Payer: Medicare Other | Admitting: Physical Medicine and Rehabilitation

## 2021-07-21 ENCOUNTER — Encounter: Payer: Self-pay | Admitting: Internal Medicine

## 2021-07-21 ENCOUNTER — Ambulatory Visit (INDEPENDENT_AMBULATORY_CARE_PROVIDER_SITE_OTHER): Payer: Medicare Other | Admitting: Internal Medicine

## 2021-07-21 VITALS — BP 134/82 | HR 73 | Temp 97.9°F | Ht 72.0 in | Wt 172.0 lb

## 2021-07-21 DIAGNOSIS — Z888 Allergy status to other drugs, medicaments and biological substances status: Secondary | ICD-10-CM

## 2021-07-21 DIAGNOSIS — E785 Hyperlipidemia, unspecified: Secondary | ICD-10-CM | POA: Diagnosis not present

## 2021-07-21 DIAGNOSIS — Z0001 Encounter for general adult medical examination with abnormal findings: Secondary | ICD-10-CM | POA: Insufficient documentation

## 2021-07-21 DIAGNOSIS — Z23 Encounter for immunization: Secondary | ICD-10-CM

## 2021-07-21 DIAGNOSIS — I10 Essential (primary) hypertension: Secondary | ICD-10-CM | POA: Diagnosis not present

## 2021-07-21 DIAGNOSIS — H66002 Acute suppurative otitis media without spontaneous rupture of ear drum, left ear: Secondary | ICD-10-CM | POA: Diagnosis not present

## 2021-07-21 MED ORDER — SHINGRIX 50 MCG/0.5ML IM SUSR: 0.5000 mL | Freq: Once | INTRAMUSCULAR | 1 refills | Status: AC

## 2021-07-21 MED ORDER — AMOXICILLIN-POT CLAVULANATE 875-125 MG PO TABS: 1.0000 | ORAL_TABLET | Freq: Two times a day (BID) | ORAL | 0 refills | Status: AC

## 2021-07-21 NOTE — Patient Instructions (Signed)
Otitis Media, Adult ?Otitis media occurs when there is inflammation and fluid in the middle ear with signs and symptoms of an acute infection. The middle ear is a part of the ear that contains bones for hearing as well as air that helps send sounds to the brain. When infected fluid builds up in this space, it causes pressure and can lead to an ear infection. The eustachian tube connects the middle ear to the back of the nose (nasopharynx) and normally allows air into the middle ear. If the eustachian tube becomes blocked, fluid can build up and become infected. ?What are the causes? ?This condition is caused by a blockage in the eustachian tube. This can be caused by mucus or by swelling of the tube. Problems that can cause a blockage include: ?A cold or other upper respiratory infection. ?Allergies. ?An irritant, such as tobacco smoke. ?Enlarged adenoids. The adenoids are areas of soft tissue located high in the back of the throat, behind the nose and the roof of the mouth. They are part of the body's defense system (immune system). ?A mass in the nasopharynx. ?Damage to the ear caused by pressure changes (barotrauma). ?What increases the risk? ?You are more likely to develop this condition if you: ?Smoke or are exposed to tobacco smoke. ?Have an opening in the roof of your mouth (cleft palate). ?Have gastroesophageal reflux. ?Have an immune system disorder. ?What are the signs or symptoms? ?Symptoms of this condition include: ?Ear pain. ?Fever. ?Decreased hearing. ?Tiredness (lethargy). ?Fluid leaking from the ear, if the eardrum is ruptured or has burst. ?Ringing in the ear. ?How is this diagnosed? ?This condition is diagnosed with a physical exam. During the exam, your health care provider will use an instrument called an otoscope to look in your ear and check for redness, swelling, and fluid. He or she will also ask about your symptoms. ?Your health care provider may also order tests, such as: ?A pneumatic  otoscopy. This is a test to check the movement of the eardrum. It is done by squeezing a small amount of air into the ear. ?A tympanogram. This is a test that shows how well the eardrum moves in response to air pressure in the ear canal. It provides a graph for your health care provider to review. ?How is this treated? ?This condition can go away on its own within 3-5 days. But if the condition is caused by a bacterial infection and does not go away on its own, or if it keeps coming back, your health care provider may: ?Prescribe antibiotic medicine to treat the infection. ?Prescribe or recommend medicines to control pain. ?Follow these instructions at home: ?Take over-the-counter and prescription medicines only as told by your health care provider. ?If you were prescribed an antibiotic medicine, take it as told by your health care provider. Do not stop taking the antibiotic even if you start to feel better. ?Keep all follow-up visits. This is important. ?Contact a health care provider if: ?You have bleeding from your nose. ?There is a lump on your neck. ?You are not feeling better in 5 days. ?You feel worse instead of better. ?Get help right away if: ?You have severe pain that is not controlled with medicine. ?You have swelling, redness, or pain around your ear. ?You have stiffness in your neck. ?A part of your face is not moving (paralyzed). ?The bone behind your ear (mastoid bone) is tender when you touch it. ?You develop a severe headache. ?Summary ?Otitis media is redness,  soreness, and swelling of the middle ear, usually resulting in pain and decreased hearing. ?This condition can go away on its own within 3-5 days. ?If the problem does not go away in 3-5 days, your health care provider may give you medicines to treat the infection. ?If you were prescribed an antibiotic medicine, take it as told by your health care provider. ?Follow all instructions that were given to you by your health care provider. ?This  information is not intended to replace advice given to you by your health care provider. Make sure you discuss any questions you have with your health care provider. ?Document Revised: 07/12/2020 Document Reviewed: 07/12/2020 ?Elsevier Patient Education ? [employer]. ? ?

## 2021-07-21 NOTE — Progress Notes (Signed)
? ?Subjective:  ?Patient ID: Lee Blevins, male    DOB: 1945/11/06  Age: 76 y.o. MRN: [id number] ? ?CC: Annual Exam, Hypertension, and Ear Pain ? ?HPI ?Lee Blevins presents for a CPX and f/up - ? ?He complains of a one week hx of left ear pain and LOH. ? ?Outpatient Medications Prior to Visit  ?Medication Sig Dispense Refill  ? albuterol (VENTOLIN HFA) 108 (90 Base) MCG/ACT inhaler TAKE 2 PUFFS BY MOUTH EVERY 6 HOURS AS NEEDED FOR WHEEZE OR SHORTNESS OF BREATH 8.5 each 8  ? fluticasone-salmeterol (ADVAIR) 250-50 MCG/ACT AEPB INHALE 1 PUFF TWICE DAILY **RINSE MOUTH AFTERWARDS** 180 each 3  ? irbesartan (AVAPRO) 150 MG tablet Take 1 tablet (150 mg total) by mouth daily. 90 tablet 0  ? montelukast (SINGULAIR) 10 MG tablet TAKE 1 TABLET BY MOUTH EVERYDAY AT BEDTIME 90 tablet 2  ? montelukast (SINGULAIR) 10 MG tablet TAKE 1 TABLET BY MOUTH EVERYDAY AT BEDTIME 90 tablet 3  ? predniSONE (DELTASONE) 5 MG tablet Take 1 tablet (5 mg total) by mouth daily. 30 tablet 0  ? ?No facility-administered medications prior to visit.  ? ? ?ROS ?Review of Systems  ?Constitutional:  Negative for chills, diaphoresis, fatigue and fever.  ?HENT:  Positive for ear pain, hearing loss, postnasal drip and rhinorrhea. Negative for ear discharge, facial swelling, sinus pressure, sore throat, trouble swallowing and voice change.   ?Respiratory:  Positive for shortness of breath. Negative for cough, wheezing and stridor.   ?Cardiovascular:  Negative for chest pain, palpitations and leg swelling.  ?Gastrointestinal: Negative.  Negative for abdominal pain, diarrhea and nausea.  ?Genitourinary: Negative.  Negative for difficulty urinating.  ?Musculoskeletal: Negative.   ?Skin: Negative.  Negative for pallor and rash.  ?Neurological:  Negative for dizziness, weakness and light-headedness.  ?Hematological:  Negative for adenopathy. Does not bruise/bleed easily.  ?Psychiatric/Behavioral: Negative.    ? ?Objective:  ?BP 134/82 (BP Location: Left Arm,  Patient Position: Sitting, Cuff Size: Normal)   Pulse 73   Temp 97.9 ?F (36.6 ?C) (Oral)   Ht 6' (1.829 m)   Wt 172 lb (78 kg)   SpO2 97%   BMI 23.33 kg/m?  ? ?BP Readings from Last 3 Encounters:  ?07/21/21 134/82  ?03/30/21 (!) 150/64  ?03/09/21 (!) 163/84  ? ? ?Wt Readings from Last 3 Encounters:  ?07/21/21 172 lb (78 kg)  ?03/30/21 171 lb 9.6 oz (77.8 kg)  ?02/21/21 169 lb (76.7 kg)  ? ? ?Physical Exam ?Vitals reviewed.  ?Constitutional:   ?   General: He is not in acute distress. ?   Appearance: He is not ill-appearing, toxic-appearing or diaphoretic.  ?HENT:  ?   Right Ear: Hearing, tympanic membrane, ear canal and external ear normal.  ?   Left Ear: Decreased hearing noted. No drainage, swelling or tenderness. A middle ear effusion is present. There is no impacted cerumen. No foreign body. No mastoid tenderness. Tympanic membrane is injected, erythematous and bulging.  ?   Nose: Nose normal.  ?   Mouth/Throat:  ?   Mouth: Mucous membranes are moist.  ?Eyes:  ?   General: No scleral icterus. ?   Conjunctiva/sclera: Conjunctivae normal.  ?Cardiovascular:  ?   Rate and Rhythm: Normal rate and regular rhythm.  ?   Heart sounds: No murmur heard. ?Pulmonary:  ?   Effort: Pulmonary effort is normal.  ?   Breath sounds: No stridor. No wheezing, rhonchi or rales.  ?Abdominal:  ?   General: Abdomen is flat.  ?  Palpations: There is no mass.  ?   Tenderness: There is no abdominal tenderness. There is no guarding.  ?   Hernia: No hernia is present.  ?Musculoskeletal:     ?   General: Normal range of motion.  ?   Cervical back: Neck supple.  ?   Right lower leg: No edema.  ?   Left lower leg: No edema.  ?Lymphadenopathy:  ?   Cervical: No cervical adenopathy.  ?Skin: ?   General: Skin is warm and dry.  ?Neurological:  ?   General: No focal deficit present.  ?Psychiatric:     ?   Mood and Affect: Mood normal.     ?   Behavior: Behavior normal.  ? ? ?Lab Results  ?Component Value Date  ? WBC 4.9 01/18/2021  ? HGB 14.4  01/18/2021  ? HCT 41.9 01/18/2021  ? PLT 209 01/18/2021  ? GLUCOSE 96 01/18/2021  ? CHOL 250 (H) 01/12/2021  ? TRIG 42 01/12/2021  ? HDL 112 01/12/2021  ? LDLDIRECT 160.0 06/13/2013  ? LDLCALC 130 (H) 01/12/2021  ? ALT 21 01/18/2021  ? AST 21 01/18/2021  ? NA 136 01/18/2021  ? K 3.9 01/18/2021  ? CL 102 01/18/2021  ? CREATININE 0.97 01/18/2021  ? BUN 20 01/18/2021  ? CO2 26 01/18/2021  ? TSH 1.62 06/19/2018  ? PSA 0.63 06/19/2018  ? INR 1.0 01/11/2021  ? HGBA1C 5.2 01/12/2021  ? ? ?No results found. ? ?Assessment & Plan:  ? ?Machai was seen today for annual exam, hypertension and ear pain. ? ?Diagnoses and all orders for this visit: ? ?Encounter for general adult medical examination with abnormal findings- Exam completed, labs reviewed, vaccines reviewed and updated, no cancer screenings indicated, patient education was given. ? ?Non-recurrent acute suppurative otitis media of left ear without spontaneous rupture of tympanic membrane ?-     amoxicillin-clavulanate (AUGMENTIN) 875-125 MG tablet; Take 1 tablet by mouth 2 (two) times daily for 7 days. ? ?Need for shingles vaccine ?-     Zoster Vaccine Adjuvanted [facility]) injection; Inject 0.5 mLs into the muscle once for 1 dose. ? ?Allergy to statin medication ? ?Hyperlipidemia with target LDL less than 70- He will not take a statin. ? ?Essential hypertension- His BP is well controlled. ? ? ?I am having Lee Blevins start on amoxicillin-clavulanate and Shingrix. I am also having him maintain his predniSONE, albuterol, fluticasone-salmeterol, montelukast, montelukast, and irbesartan. ? ?Meds ordered this encounter  ?Medications  ? amoxicillin-clavulanate (AUGMENTIN) 875-125 MG tablet  ?  Sig: Take 1 tablet by mouth 2 (two) times daily for 7 days.  ?  Dispense:  14 tablet  ?  Refill:  0  ? Zoster Vaccine Adjuvanted [facility]) injection  ?  Sig: Inject 0.5 mLs into the muscle once for 1 dose.  ?  Dispense:  0.5 mL  ?  Refill:  1  ? ? ? ?Follow-up: Return if symptoms  worsen or fail to improve. ? ?Scarlette Calico, MD ?

## 2021-07-29 ENCOUNTER — Telehealth: Payer: Self-pay | Admitting: Internal Medicine

## 2021-07-29 ENCOUNTER — Other Ambulatory Visit: Payer: Self-pay | Admitting: Internal Medicine

## 2021-07-29 DIAGNOSIS — H66002 Acute suppurative otitis media without spontaneous rupture of ear drum, left ear: Secondary | ICD-10-CM

## 2021-07-29 DIAGNOSIS — J209 Acute bronchitis, unspecified: Secondary | ICD-10-CM

## 2021-07-29 MED ORDER — AMOXICILLIN-POT CLAVULANATE 875-125 MG PO TABS: 1.0000 | ORAL_TABLET | Freq: Two times a day (BID) | ORAL | 0 refills | Status: AC

## 2021-07-29 NOTE — Telephone Encounter (Signed)
Pts spouse states pts ear infection has improved but has not cleared ? ?Spouse inquiring if another round of antibiotics can be prescribed  ? ?Pt was seen on 4-6  ? ?Please advise  ?

## 2021-08-03 ENCOUNTER — Ambulatory Visit (INDEPENDENT_AMBULATORY_CARE_PROVIDER_SITE_OTHER): Payer: Medicare Other | Admitting: Adult Health

## 2021-08-03 ENCOUNTER — Encounter: Payer: Self-pay | Admitting: Adult Health

## 2021-08-03 VITALS — BP 148/80 | HR 71 | Ht 72.0 in | Wt 171.0 lb

## 2021-08-03 DIAGNOSIS — I619 Nontraumatic intracerebral hemorrhage, unspecified: Secondary | ICD-10-CM

## 2021-08-03 DIAGNOSIS — I639 Cerebral infarction, unspecified: Secondary | ICD-10-CM

## 2021-08-03 NOTE — Progress Notes (Addendum)
?[name] ?[address] ?[place]. [place] [address] ?[phone] ? ?     STROKE FOLLOW UP NOTE ? ?[name] ?Date of Birth:  March 04, 1946 ?Medical Record Number:  [id number]  ? ?Reason for Referral: stroke follow up ? ? ? ?SUBJECTIVE: ? ? ?CHIEF COMPLAINT:  ?Chief Complaint  ?Patient presents with  ? Follow-up  ?  RM 3 with [name] ?Pt is well and stable, no new concerns   ? ? ?HPI:  ? ?Update 08/03/2021 JM: Patient returns for stroke follow-up after prior visit 5 months ago.  He is accompanied by his wife, Lee Blevins.  Overall stable without new stroke/TIA symptoms.  Continued imbalance about the same since prior visit. Does not use any AD, no falls.  Continued vision impairement, stable, was seen by neuro-ophthalmology with stable peripheral visual loss as seen on initial ophthalmology exam (per wife).  Continue cognitive impairment also stable since prior visit.  Wife questions use of medications to help improve memory.  He does try to stay active and occasionally does memory exercises.  Completed therapies back in November, initially planned to just placed on hold for 1 month but wife and patient do not believe additional therapies are needed.  Blood pressure today 148/80.  He continues to be resistant to statin therapy or any cholesterol medication.  Routinely followed by PCP.  No further concerns at this time. ? ? ? ?History provided for reference purposes only ?Initial visit 02/21/2021 JM: Mr. Due is being seen for initial hospital follow-up accompanied by his wife, Lee Blevins. ? ?Overall stable from stroke standpoint since discharge.  Denies new stroke/TIA symptoms ?Reports residual mild imbalance but overall improving currently working with PT. ambulates without assistive device. Continued right sided visual loss but has been gradually improving - seen by ophthalmology today with noted improvement of visual loss since initial onset.  Has follow-up visit scheduled 12/8.   Currently working with OT for visual spatial deficit.  Currently working with SLP for cognitive impairment and aphasia. Recent SLUMS 14/30 (deficits per SLP note: "difficulty with orientation (unable to tell year), working and short term memory, attention, executive functioning and cognitive flexibility"). Per wife and patient, no prior cognitive concerns although she does not some issues with attention PTA but she believes this is more due to age. He does request return back to driving.  He has been slowly returning back to prior activities such as light yard work without difficulty. ? ?Blood pressure today 148/77. Blood pressure monitored at home which has been stable typically 120-130s/70s.  ?He is not currently on Zetia as patient elected to d/c prior to CIR discharge. ? ?He has not yet had follow-up with PCP ? ?No further concerns at this time ? ?Stroke admission 01/11/2021 ?76 year old male with history of hypertension, hyperlipidemia, alcohol use, left parietal ICH in 03/2010 admitted on 01/11/2021 for confusion, aphasia and vision changes.  Personally reviewed hospitalization pertinent progress notes, lab work and imaging.  CT showed left occipital ICH.  CTA head and neck unremarkable.  CT repeat showed stable hematoma.  MRI showed left occipital ICH and chronic left parietal ICH, and acute left CR small infarct.  EF 55 to 60%, A1c 5.2, LDL 130.  B12 194, UDS negative.  Creatinine 0.85.  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 BP good control with minimal BP meds therefore low suspicion for hypertensive etiology.  Left CR small infarct likely secondary to small vessel disease.  Antiplatelet/anticoagulation  not recommended during admission in setting of current ICH. BP goal less than 140. Hx of statin intolerance -Zetia 10 mg daily started. Hx of tobacco use (quit 40 years ago) and current EtOH use (3 glasses of wine per night). PT/OT/SLP eval recommended  CIR ? ? ? ? ? ? ?PERTINENT IMAGING ? ?Code Stroke CT head  ?Area of acute hemorrhage within the left occipital lobe measuring up to 3.7 cm. Atrophy, chronic small vessel disease ?Repeat CT head ?Unchanged appearance of left occipital intraparenchymal hematoma with mild surrounding edema. ?Unchanged appearance of multiple old infarcts and chronic ?microvascular ischemia. ?CTA head & neck  ?Left occipital hematoma unchanged in size. Small adjacent subarachnoid hemorrhage unchanged.No underlying ?vascular malformation on CTA ?No significant carotid or vertebral artery stenosis in the neck ?MRI  ?Unchanged size of left occipital intraparenchymal hematoma ?allowing for differences in modality. ?Small area of acute ischemia in the left centrum semiovale. ?Chronic microvascular ischemia and volume loss. ?  ?2D Echo  ?EF 55-60%, Left ventricular diastolic parameters are consistent  ?with Grade I diastolic dysfunction, No thrombus, wall motion abnormality or shunt found.    ? ? ? ?ROS:   ?14 system review of systems performed and negative with exception of those listed in HPI ? ?PMH:  ?Past Medical History:  ?Diagnosis Date  ? Asthma   ? Chronic sinusitis   ? 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 HTN (hypertension)   ? Hyperlipidemia   ? Nasal polyps   ? Pneumonia   ? ? ?PSH:  ?Past Surgical History:  ?Procedure Laterality Date  ? NASAL POLYP EXCISION    ? x2  ? TONSILLECTOMY    ? ? ?Social History:  ?Social History  ? ?Socioeconomic History  ? Marital status: Married  ?  Spouse name: [name]  ? Number of children: Not on file  ? Years of education: Not on file  ? Highest education level: Bachelor's degree (e.g., BA, AB, BS)  ?Occupational History  ? Occupation: RETIRED  ?  Employer: RETIRED  ?  Comment: Public affairs consultant  ?Tobacco Use  ? Smoking status: Former  ?  Packs/day: 1.00  ?  Years: 1.00  ?  Pack years: 1.00  ?  Types: Cigarettes  ?  Quit date: 04/17/1973  ?  Years since quitting: 48.3  ? Smokeless  tobacco: Never  ? Tobacco comments:  ?  1975  ?Vaping Use  ? Vaping Use: Never used  ?Substance and Sexual Activity  ? Alcohol use: Yes  ?  Alcohol/week: 15.0 standard drinks  ?  Types: 15 Glasses of wine per week  ?  Comment: 02/21/21 7 a week  ? Drug use: No  ? Sexual activity: Not Currently  ?Other Topics Concern  ? Not on file  ?Social History Narrative  ? 02/21/21 lives with wife  ? Regular Exercise -  YES  ? ?Social Determinants of Health  ? ?Financial Resource Strain: Not on file  ?Food Insecurity: Not on file  ?Transportation Needs: Not on file  ?Physical Activity: Not on file  ?Stress: Not on file  ?Social Connections: Not on file  ?Intimate Partner Violence: Not on file  ? ? ?Family History:  ?Family History  ?Problem Relation Age of Onset  ? Heart disease Father   ? Valvular heart disease Father   ? Neurologic Disorder Mother 46  ?     Similar to MS  ? Cancer Neg Hx   ? Early death Neg Hx   ?  Hearing loss Neg Hx   ? Hyperlipidemia Neg Hx   ? Hypertension Neg Hx   ? Stroke Neg Hx   ? Kidney disease Neg Hx   ? ? ?Medications:   ?Current Outpatient Medications on File Prior to Visit  ?Medication Sig Dispense Refill  ? albuterol (VENTOLIN HFA) 108 (90 Base) MCG/ACT inhaler TAKE 2 PUFFS BY MOUTH EVERY 6 HOURS AS NEEDED FOR WHEEZE OR SHORTNESS OF BREATH 8.5 each 8  ? amoxicillin-clavulanate (AUGMENTIN) 875-125 MG tablet Take 1 tablet by mouth 2 (two) times daily for 7 days. 14 tablet 0  ? fluticasone-salmeterol (ADVAIR) 250-50 MCG/ACT AEPB INHALE 1 PUFF TWICE DAILY **RINSE MOUTH AFTERWARDS** 180 each 3  ? irbesartan (AVAPRO) 150 MG tablet Take 1 tablet (150 mg total) by mouth daily. 90 tablet 0  ? montelukast (SINGULAIR) 10 MG tablet TAKE 1 TABLET BY MOUTH EVERYDAY AT BEDTIME 90 tablet 2  ? montelukast (SINGULAIR) 10 MG tablet TAKE 1 TABLET BY MOUTH EVERYDAY AT BEDTIME 90 tablet 3  ? predniSONE (DELTASONE) 5 MG tablet Take 1 tablet (5 mg total) by mouth daily. 30 tablet 0  ? ?No current facility-administered  medications on file prior to visit.  ? ? ?Allergies:   ?Allergies  ?Allergen Reactions  ? Amlodipine   ?  edema  ? Crestor [Rosuvastatin]   ?  Muscle aches  ? Livalo [Pitavastatin]   ?  Muscle aches  ? Bystolic

## 2021-08-03 NOTE — Patient Instructions (Addendum)
Would recommend establishing care with ophthalmologist such as [facility]  ? ?You will be called to schedule a repeat MR brain to look for possible underlying causes of your bleed ? ?Continue to follow up with PCP regarding cholesterol and blood pressure management  ?Maintain strict control of hypertension with blood pressure goal below 130/90 and cholesterol with LDL cholesterol (bad cholesterol) goal below 70 mg/dL.  ? ?Signs of a Stroke? Follow the BEFAST method:  ?Balance Watch for a sudden loss of balance, trouble with coordination or vertigo ?Eyes Is there a sudden loss of vision in one or both eyes? Or double vision?  ?Face: Ask the person to smile. Does one side of the face droop or is it numb?  ?Arms: Ask the person to raise both arms. Does one arm drift downward? Is there weakness or numbness of a leg? ?Speech: Ask the person to repeat a simple phrase. Does the speech sound slurred/strange? Is the person confused ? ?Time: If you observe any of these signs, call 911. ? ? ? ? ?Followup in the future with me in 6 months or call earlier if needed ? ? ? ? ? ? ?Thank you for coming to see [place] at [facility]. I hope we have been able to provide you high quality care today. ? ?You may receive a patient satisfaction survey over the next few weeks. We would appreciate your feedback and comments so that we may continue to improve ourselves and the health of our patients. ? ? ? ? ? ?Mild Neurocognitive Disorder ?Mild neurocognitive disorder, formerly known as mild cognitive impairment, is a disorder in which memory does not work as well as it should. This disorder may also cause problems with other mental functions, including thought, communication, behavior, and completion of tasks. These problems can be noticed and measured, but they usually do not interfere with daily activities or the ability to live independently. ?Mild neurocognitive disorder typically develops after 78 years of age, but it  can also develop at younger ages. It is not as serious as major neurocognitive disorder, also known as dementia, but it may be the first sign of it. Generally, symptoms of this condition get worse over time. In rare cases, symptoms can get better. ?What are the causes? ?This condition may be caused by: ?Brain disorders like Alzheimer's disease, Parkinson's disease, and other conditions that gradually damage nerve cells (neurodegenerative conditions). ?Diseases that affect blood vessels in the brain and result in small strokes. ?Certain infections, such as HIV. ?Traumatic brain injury. ?Other medical conditions, such as brain tumors, underactive thyroid (hypothyroidism), and vitamin B12 deficiency. ?Use of certain drugs or prescription medicines. ?What increases the risk? ?The following factors may make you more likely to develop this condition: ?Being older than 65 years. ?Being male. ?Low education level. ?Diabetes, high blood pressure, high cholesterol, and other conditions that increase the risk for blood vessel diseases. ?Untreated or undertreated sleep apnea. ?Having a certain type of gene that can be passed from parent to child (inherited). ?Chronic health problems such as heart disease, lung disease, liver disease, kidney disease, or depression. ?What are the signs or symptoms? ?Symptoms of this condition include: ?Difficulty remembering. You may: ?Forget names, phone numbers, or details of recent events. ?Forget social events and appointments. ?Repeatedly forget where you put your car keys or other items. ?Difficulty thinking and solving problems. You may have trouble with complex tasks, such as: ?Paying bills. ?Driving in unfamiliar places. ?Difficulty communicating. You may have trouble: ?  Finding the right word or naming an object. ?Forming a sentence that makes sense, or understanding what you read or hear. ?Changes in your behavior or personality. When this happens, you may: ?Lose interest in the things  that you used to enjoy. ?Withdraw from social situations. ?Get angry more easily than usual. ?Act before thinking. ?How is this diagnosed? ?This condition is diagnosed based on: ?Your symptoms. Your health care provider may ask you and the people you spend time with, such as family and friends, about your symptoms. ?Evaluation of mental functions (neuropsychological testing). Your health care provider may refer you to a neurologist or mental health specialist to evaluate your mental functions in detail. ?To identify the cause of your condition, your health care provider may: ?Get a detailed medical history. ?Ask about use of alcohol, drugs, and prescription medicines. ?Do a physical exam. ?Order blood tests and brain imaging exams. ?How is this treated? ?Mild neurocognitive disorder that is caused by medicine use, drug use, infection, or another medical condition may improve when the cause is treated, or when medicines or drugs are stopped. If this disorder has another cause, it generally does not improve and may get worse. In these cases, the goal of treatment is to help you manage the loss of mental function. Treatments in these cases include: ?Medicine. Medicine mainly helps memory and behavior symptoms. ?Talk therapy. Talk therapy provides education, emotional support, memory aids, and other ways of making up for problems with mental function. ?Lifestyle changes, including: ?Getting regular exercise. ?Eating a healthy diet that includes omega-3 fatty acids. ?Challenging your thinking and memory skills. ?Having more social interaction. ?Follow these instructions at home: ?Eating and drinking ? ?Drink enough fluid to keep your urine pale yellow. ?Eat a healthy diet that includes omega-3 fatty acids. These can be found in: ?Fish. ?Nuts. ?Leafy vegetables. ?Vegetable oils. ?If you drink alcohol: ?Limit how much you use to: ?0-1 drink a day for women. ?0-2 drinks a day for men. ?Be aware of how much alcohol is in  your drink. In the [place] one drink equals one 12 oz bottle of beer (355 mL), one 5 oz glass of wine (148 mL), or one 1? oz glass of hard liquor (44 mL). ?Lifestyle ? ?Get regular exercise as told by your health care provider. ?Do not use any products that contain nicotine or tobacco, such as cigarettes, e-cigarettes, and chewing tobacco. If you need help quitting, ask your health care provider. ?Practice ways to manage stress. If you need help managing stress, ask your health care provider. ?Continue to have social interaction. ?Keep your mind active with stimulating activities you enjoy, such as reading or playing games. ?Make sure to get quality sleep. Follow these tips: ?Avoid napping during the day. ?Keep your sleeping area dark and cool. ?Avoid exercising during the few hours before you go to bed. ?Avoid caffeine products in the evening. ?General instructions ?Take over-the-counter and prescription medicines only as told by your health care provider. Your health care provider may recommend that you avoid taking medicines that can affect thinking, such as pain medicines or sleep medicines. ?Work with your health care provider to find out what you need help with and what your safety needs are. ?Keep all follow-up visits. This is important. ?Where to find more information ?[employer] on Aging: http://kim-miller.com/ ?Contact a health care provider if: ?You have any new symptoms. ?Get help right away if: ?You develop new confusion or your confusion gets worse. ?You act in ways that place  you or your family in danger. ?Summary ?Mild neurocognitive disorder is a disorder in which memory does not work as well as it should. ?Mild neurocognitive disorder can have many causes. It may be the first stage of dementia. ?To manage your condition, get regular exercise, keep your mind active, get quality sleep, and eat a healthy diet. ?This information is not intended to replace advice given to you by your health care  provider. Make sure you discuss any questions you have with your health care provider. ?Document Revised: 08/18/2019 Document Reviewed: 08/18/2019 ?Elsevier Patient Education ? [place]. ? ? ? ? ? ? ?

## 2021-08-11 ENCOUNTER — Telehealth: Payer: Self-pay | Admitting: Adult Health

## 2021-08-11 NOTE — Telephone Encounter (Signed)
[employer] medicare auth: [id number] exp. 08/11/21-09/10/21 spoke to [facility] sent to GI they will reach out to the patient to schedule. ?

## 2021-08-16 ENCOUNTER — Encounter
Payer: Medicare Other | Attending: Physical Medicine and Rehabilitation | Admitting: Physical Medicine and Rehabilitation

## 2021-08-16 ENCOUNTER — Encounter: Payer: Self-pay | Admitting: Physical Medicine and Rehabilitation

## 2021-08-16 VITALS — BP 165/87 | HR 68 | Ht 72.0 in | Wt 171.0 lb

## 2021-08-16 DIAGNOSIS — H53451 Other localized visual field defect, right eye: Secondary | ICD-10-CM | POA: Insufficient documentation

## 2021-08-16 DIAGNOSIS — I619 Nontraumatic intracerebral hemorrhage, unspecified: Secondary | ICD-10-CM | POA: Insufficient documentation

## 2021-08-16 DIAGNOSIS — T7840XS Allergy, unspecified, sequela: Secondary | ICD-10-CM | POA: Diagnosis present

## 2021-08-16 NOTE — Patient Instructions (Addendum)
Bee pollen ?Royal jelly  ?Propillus ? ? ?-provided a link to a pdf of Pete Escogue's musculoskeletal alignment exercises: [email or web address]  ?

## 2021-08-16 NOTE — Progress Notes (Signed)
? ?Subjective:  ? ? Patient ID: Lee Blevins, male    DOB: 09-17-1945, 76 y.o.   MRN: [id number] ? ?HPI ?Lee Blevins is a 76 year old man who presents to for follow-up of ICH. ? ?Doing a lot of yard work.  ?Not requiring blood pressure medication most days ?He has been eating very well at home.  ?No pain ?Biggest complains is the eye sight. It is about the same.  ?Wife is driving him currently. She wants him to be able to resume his driving.  ?-He saw the neuro-ophthalmologist as he has not had return of his peripheral vision.  ?No falls ?-driving when his wife lets him. He does ok when he drives. Dr. Frederico Hamman said it did not get worse. They are thinking to see an opthalmologist rather than an optemetrist. He did not some macular issue but did not feel it was of concern.  ?-he has a lot of allergies to pollen. He uses his taste and smell. He had an ear infection and it is annoying.  ?Cutting grass ?He gets on the roof as well.  ?BP is not usually as high at home 130s. He is currently on Avapro.  ?Wife asks whether CT should be repeated ? ?Pain Inventory ?Average Pain 0 ?Pain Right Now 0 ?My pain is  no pain ? ?LOCATION OF PAIN  no pain ? ?BOWEL ?Number of stools per week: 7 ? ? ?BLADDER ?Normal ? ? ? ?Mobility ?walk without assistance ?how many minutes can you walk? 60 ?ability to climb steps?  yes ?do you drive?  yes ?Do you have any goals in this area?  no ? ?Function ?retired ?Do you have any goals in this area?  no ? ?Neuro/Psych ?dizziness ?confusion ? ?Prior Studies ?Any changes since last visit?  no ? ?Physicians involved in your care ?Any changes since last visit?  no ? ? ?Family History  ?Problem Relation Age of Onset  ? Heart disease Father   ? Valvular heart disease Father   ? Neurologic Disorder Mother 53  ?     Similar to MS  ? Cancer Neg Hx   ? Early death Neg Hx   ? Hearing loss Neg Hx   ? Hyperlipidemia Neg Hx   ? Hypertension Neg Hx   ? Stroke Neg Hx   ? Kidney disease Neg Hx   ? ?Social History   ? ?Socioeconomic History  ? Marital status: Married  ?  Spouse name: [name]  ? Number of children: Not on file  ? Years of education: Not on file  ? Highest education level: Bachelor's degree (e.g., BA, AB, BS)  ?Occupational History  ? Occupation: RETIRED  ?  Employer: RETIRED  ?  Comment: Public affairs consultant  ?Tobacco Use  ? Smoking status: Former  ?  Packs/day: 1.00  ?  Years: 1.00  ?  Pack years: 1.00  ?  Types: Cigarettes  ?  Quit date: 04/17/1973  ?  Years since quitting: 48.3  ? Smokeless tobacco: Never  ? Tobacco comments:  ?  1975  ?Vaping Use  ? Vaping Use: Never used  ?Substance and Sexual Activity  ? Alcohol use: Yes  ?  Alcohol/week: 15.0 standard drinks  ?  Types: 15 Glasses of wine per week  ?  Comment: 02/21/21 7 a week  ? Drug use: No  ? Sexual activity: Not Currently  ?Other Topics Concern  ? Not on file  ?Social History Narrative  ? 02/21/21 lives with wife  ?  Regular Exercise -  YES  ? ?Social Determinants of Health  ? ?Financial Resource Strain: Not on file  ?Food Insecurity: Not on file  ?Transportation Needs: Not on file  ?Physical Activity: Not on file  ?Stress: Not on file  ?Social Connections: Not on file  ? ?Past Surgical History:  ?Procedure Laterality Date  ? NASAL POLYP EXCISION    ? x2  ? TONSILLECTOMY    ? ?Past Medical History:  ?Diagnosis Date  ? Asthma   ? Chronic sinusitis   ? 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 HTN (hypertension)   ? Hyperlipidemia   ? Nasal polyps   ? Pneumonia   ? ?There were no vitals taken for this visit. ? ?Opioid Risk Score:   ?Fall Risk Score:  `1 ? ?Depression screen PHQ 2/9 ? ? ?  02/08/2021  ? 11:51 AM 06/19/2018  ?  1:25 PM 06/24/2014  ? 11:39 AM 10/23/2012  ?  1:27 PM  ?Depression screen PHQ 2/9  ?Decreased Interest 0 0 0 0  ?Down, Depressed, Hopeless 0 0 0 0  ?PHQ - 2 Score 0 0 0 0  ?Altered sleeping 0     ?Tired, decreased energy 0     ?Change in appetite 0     ?Feeling bad or failure about yourself  0     ?Trouble concentrating 0      ?Moving slowly or fidgety/restless 0     ?Suicidal thoughts 0     ?PHQ-9 Score 0     ?Difficult doing work/chores Not difficult at all     ?  ? ?Review of Systems  ?Constitutional: Negative.   ?HENT: Negative.    ?Eyes: Negative.   ?Respiratory: Negative.    ?Cardiovascular: Negative.   ?Gastrointestinal: Negative.   ?Endocrine: Negative.   ?Genitourinary: Negative.   ?Musculoskeletal: Negative.   ?Skin: Negative.   ?Allergic/Immunologic: Negative.   ?Neurological:  Positive for dizziness.  ?Hematological: Negative.   ?Psychiatric/Behavioral:  Positive for confusion.   ? ?   ?Objective:  ? Physical Exam ? ?Gen: no distress, normal appearing ?HEENT: oral mucosa pink and moist, NCAT ?Cardio: Reg rate ?Chest: normal effort, normal rate of breathing ?Abd: soft, non-distended ?Ext: no edema ?Psych: pleasant, normal affect ?Skin: intact ?Neuro: Alert and oriented x3. Impaired peripheral vision on right. Normal gait ?   ?Assessment & Plan:  ?Lee Blevins is a 76 year old man who presents for hospital follow-up after ICH.  ? ?1) ICH ?-continue HEP ?-provided dietary and exercise counseling ?-discussed that hypertension is number one reversible risk factor for stroke.   ?-reviewed all medications ?-discussed potential stroke etiologies ?-made goal to reduce life stressors ?-discussed that CT Head likely not necessary since symptoms have been stable ? ?2) HTN: ?-BP is 165/87 today but has been very well controlled on home reads. Advised getting new BP monitor at home as current one is 78 years old to ensure accurate readings at home. Does have some white coat syndrome which could be contributing to elevated BP in office ?-continue avapro ?-Advised checking BP daily at home and logging results to bring into follow-up appointment with PCP and myself. ?-Reviewed BP meds today.  ?-Advised regarding healthy foods that can help lower blood pressure and provided with a list: ?1) citrus foods- high in vitamins and minerals ?2) salmon  and other fatty fish - reduces inflammation and oxylipins ?3) swiss chard (leafy green)- high level of nitrates ?4) pumpkin seeds- one of the  best natural sources of magnesium ?5) Beans and lentils- high in fiber, magnesium, and potassium ?6) Berries- high in flavonoids ?7) Amaranth (whole grain, can be cooked similarly to rice and oats)- high in magnesium and fiber ?8) Pistachios- even more effective at reducing BP than other nuts ?9) Carrots- high in phenolic compounds that relax blood vessels and reduce inflammation ?10) Celery- contain phthalides that relax tissues of arterial walls ?11) Tomatoes- can also improve cholesterol and reduce risk of heart disease ?12) Broccoli- good source of magnesium, calcium, and potassium ?13) Greek yogurt: high in potassium and calcium ?14) Herbs and spices: Celery seed, cilantro, saffron, lemongrass, black cumin, ginseng, cinnamon, cardamom, sweet basil, and ginger ?15) Chia and flax seeds- also help to lower cholesterol and blood sugar ?16) Beets- high levels of nitrates that relax blood vessels  ?17) spinach and bananas- high in potassium ? ?-Provided lise of supplements that can help with hypertension:  ?1) magnesium: one high quality brand is Bioptemizers since it contains all 7 types of magnesium, otherwise over the counter magnesium gluconate '400mg'$  is a good option ?2) B vitamins ?3) vitamin D ?4) potassium ?5) CoQ10 ?6) L-arginine ?7) Vitamin C ?8) Beetroot ?-Educated that goal BP is 120/80. ?-Made goal to incorporate some of the above foods into diet.   ? ?6) L-arginine ?7) Vitamin C ?8) Beetroot ?-Educated that goal BP is 120/80. ?-Made goal to incorporate some of the above foods into diet.   ? ?3) Allergies: ?-recommended bee pollen/royal jelly/local raw honey or propillus ? ?

## 2021-09-15 ENCOUNTER — Other Ambulatory Visit: Payer: Self-pay | Admitting: Internal Medicine

## 2021-09-15 DIAGNOSIS — I10 Essential (primary) hypertension: Secondary | ICD-10-CM

## 2021-11-04 ENCOUNTER — Ambulatory Visit (INDEPENDENT_AMBULATORY_CARE_PROVIDER_SITE_OTHER): Payer: Medicare Other

## 2021-11-04 DIAGNOSIS — Z0000 Encounter for general adult medical examination without abnormal findings: Secondary | ICD-10-CM | POA: Diagnosis not present

## 2021-11-04 NOTE — Patient Instructions (Signed)
[name] , Thank you for taking time to come for your Medicare Wellness Visit. I appreciate your ongoing commitment to your health goals. Please review the following plan we discussed and let me know if I can assist you in the future.   Screening recommendations/referrals: Colonoscopy: No longer recommended due to age Recommended yearly ophthalmology/optometry visit for glaucoma screening and checkup Recommended yearly dental visit for hygiene and checkup  Vaccinations: Influenza vaccine: 03/16/2021 Pneumococcal vaccine: 06/24/2014, 03/27/2019 Tdap vaccine: 12/09/2018; due every 10 years Shingles vaccine: recommend Shingrix which is 2 doses 2-6 months apart and over 90% effective    Covid-19: 05/03/2019, 05/23/2019  Advanced directives: Yes; Please bring a copy of your health care power of attorney and living will to the office at your convenience.  Conditions/risks identified: Yes; Client understands the importance of follow-up appointments with providers by attending scheduled visits and discussed goals to eat healthier, increase physical activity 5 times a week for 30 minutes each, exercise the brain by doing stimulating brain exercises (reading, adult coloring, crafting, listening to music, puzzles, etc.), socialize and enjoy life more, get enough sleep at least 8-9 hours average per night and make time for laughter.  Next appointment: Please schedule your next Medicare Wellness Visit with your Nurse Health Advisor in 1 year by calling [phone].  Preventive Care 78 Years and Older, Male Preventive care refers to lifestyle choices and visits with your health care provider that can promote health and wellness. What does preventive care include? A yearly physical exam. This is also called an annual well check. Dental exams once or twice a year. Routine eye exams. Ask your health care provider how often you should have your eyes checked. Personal lifestyle choices, including: Daily care  of your teeth and gums. Regular physical activity. Eating a healthy diet. Avoiding tobacco and drug use. Limiting alcohol use. Practicing safe sex. Taking low doses of aspirin every day. Taking vitamin and mineral supplements as recommended by your health care provider. What happens during an annual well check? The services and screenings done by your health care provider during your annual well check will depend on your age, overall health, lifestyle risk factors, and family history of disease. Counseling  Your health care provider may ask you questions about your: Alcohol use. Tobacco use. Drug use. Emotional well-being. Home and relationship well-being. Sexual activity. Eating habits. History of falls. Memory and ability to understand (cognition). Work and work Statistician. Screening  You may have the following tests or measurements: Height, weight, and BMI. Blood pressure. Lipid and cholesterol levels. These may be checked every 5 years, or more frequently if you are over 35 years old. Skin check. Lung cancer screening. You may have this screening every year starting at age 30 if you have a 30-pack-year history of smoking and currently smoke or have quit within the past 15 years. Fecal occult blood test (FOBT) of the stool. You may have this test every year starting at age 29. Flexible sigmoidoscopy or colonoscopy. You may have a sigmoidoscopy every 5 years or a colonoscopy every 10 years starting at age 33. Prostate cancer screening. Recommendations will vary depending on your family history and other risks. Hepatitis C blood test. Hepatitis B blood test. Sexually transmitted disease (STD) testing. Diabetes screening. This is done by checking your blood sugar (glucose) after you have not eaten for a while (fasting). You may have this done every 1-3 years. Abdominal aortic aneurysm (AAA) screening. You may need this if you are a current  or former smoker. Osteoporosis. You may  be screened starting at age 26 if you are at high risk. Talk with your health care provider about your test results, treatment options, and if necessary, the need for more tests. Vaccines  Your health care provider may recommend certain vaccines, such as: Influenza vaccine. This is recommended every year. Tetanus, diphtheria, and acellular pertussis (Tdap, Td) vaccine. You may need a Td booster every 10 years. Zoster vaccine. You may need this after age 71. Pneumococcal 13-valent conjugate (PCV13) vaccine. One dose is recommended after age 31. Pneumococcal polysaccharide (PPSV23) vaccine. One dose is recommended after age 59. Talk to your health care provider about which screenings and vaccines you need and how often you need them. This information is not intended to replace advice given to you by your health care provider. Make sure you discuss any questions you have with your health care provider. Document Released: 04/30/2015 Document Revised: 12/22/2015 Document Reviewed: 02/02/2015 Elsevier Interactive Patient Education  2017 [place] Prevention in the Home Falls can cause injuries. They can happen to people of all ages. There are many things you can do to make your home safe and to help prevent falls. What can I do on the outside of my home? Regularly fix the edges of walkways and driveways and fix any cracks. Remove anything that might make you trip as you walk through a door, such as a raised step or threshold. Trim any bushes or trees on the path to your home. Use bright outdoor lighting. Clear any walking paths of anything that might make someone trip, such as rocks or tools. Regularly check to see if handrails are loose or broken. Make sure that both sides of any steps have handrails. Any raised decks and porches should have guardrails on the edges. Have any leaves, snow, or ice cleared regularly. Use sand or salt on walking paths during winter. Clean up any spills in  your garage right away. This includes oil or grease spills. What can I do in the bathroom? Use night lights. Install grab bars by the toilet and in the tub and shower. Do not use towel bars as grab bars. Use non-skid mats or decals in the tub or shower. If you need to sit down in the shower, use a plastic, non-slip stool. Keep the floor dry. Clean up any water that spills on the floor as soon as it happens. Remove soap buildup in the tub or shower regularly. Attach bath mats securely with double-sided non-slip rug tape. Do not have throw rugs and other things on the floor that can make you trip. What can I do in the bedroom? Use night lights. Make sure that you have a light by your bed that is easy to reach. Do not use any sheets or blankets that are too big for your bed. They should not hang down onto the floor. Have a firm chair that has side arms. You can use this for support while you get dressed. Do not have throw rugs and other things on the floor that can make you trip. What can I do in the kitchen? Clean up any spills right away. Avoid walking on wet floors. Keep items that you use a lot in easy-to-reach places. If you need to reach something above you, use a strong step stool that has a grab bar. Keep electrical cords out of the way. Do not use floor polish or wax that makes floors slippery. If you must use wax,  use non-skid floor wax. Do not have throw rugs and other things on the floor that can make you trip. What can I do with my stairs? Do not leave any items on the stairs. Make sure that there are handrails on both sides of the stairs and use them. Fix handrails that are broken or loose. Make sure that handrails are as long as the stairways. Check any carpeting to make sure that it is firmly attached to the stairs. Fix any carpet that is loose or worn. Avoid having throw rugs at the top or bottom of the stairs. If you do have throw rugs, attach them to the floor with carpet  tape. Make sure that you have a light switch at the top of the stairs and the bottom of the stairs. If you do not have them, ask someone to add them for you. What else can I do to help prevent falls? Wear shoes that: Do not have high heels. Have rubber bottoms. Are comfortable and fit you well. Are closed at the toe. Do not wear sandals. If you use a stepladder: Make sure that it is fully opened. Do not climb a closed stepladder. Make sure that both sides of the stepladder are locked into place. Ask someone to hold it for you, if possible. Clearly mark and make sure that you can see: Any grab bars or handrails. First and last steps. Where the edge of each step is. Use tools that help you move around (mobility aids) if they are needed. These include: Canes. Walkers. Scooters. Crutches. Turn on the lights when you go into a dark area. Replace any light bulbs as soon as they burn out. Set up your furniture so you have a clear path. Avoid moving your furniture around. If any of your floors are uneven, fix them. If there are any pets around you, be aware of where they are. Review your medicines with your doctor. Some medicines can make you feel dizzy. This can increase your chance of falling. Ask your doctor what other things that you can do to help prevent falls. This information is not intended to replace advice given to you by your health care provider. Make sure you discuss any questions you have with your health care provider. Document Released: 01/28/2009 Document Revised: 09/09/2015 Document Reviewed: 05/08/2014 Elsevier Interactive Patient Education  2017 [employer].

## 2021-11-04 NOTE — Progress Notes (Signed)
I connected with Lee Blevins today by telephone and verified that I am speaking with the correct person using two identifiers. Location patient: home Location provider: work Persons participating in the virtual visit: patient, provider.   I discussed the limitations, risks, security and privacy concerns of performing an evaluation and management service by telephone and the availability of in person appointments. I also discussed with the patient that there may be a patient responsible charge related to this service. The patient expressed understanding and verbally consented to this telephonic visit.    Interactive audio and video telecommunications were attempted between this provider and patient, however failed, due to patient having technical difficulties OR patient did not have access to video capability.  We continued and completed visit with audio only.  Some vital signs may be absent or patient reported.   Time Spent with patient on telephone encounter: 30 minutes  Subjective:   Lee Blevins is a 76 y.o. male who presents for Medicare Annual/Subsequent preventive examination.  Review of Systems     Cardiac Risk Factors include: advanced age (>75mn, >>42women);hypertension;family history of premature cardiovascular disease;male gender     Objective:    There were no vitals filed for this visit. There is no height or weight on file to calculate BMI.     11/04/2021    9:52 AM 02/02/2021   10:08 AM 02/02/2021    8:55 AM 01/17/2021    5:00 PM 01/11/2021    4:27 PM  Advanced Directives  Does Patient Have a Medical Advance Directive? Yes No No No No  Type of Advance Directive Living will;Healthcare Power of Attorney      Does patient want to make changes to medical advance directive? No - Patient declined      Copy of [place] Chart? No - copy requested      Would patient like information on creating a medical advance directive?  No - Patient declined  No - Patient declined No - Patient declined No - Patient declined    Current Medications (verified) Outpatient Encounter Medications as of 11/04/2021  Medication Sig   albuterol (VENTOLIN HFA) 108 (90 Base) MCG/ACT inhaler TAKE 2 PUFFS BY MOUTH EVERY 6 HOURS AS NEEDED FOR WHEEZE OR SHORTNESS OF BREATH   fluticasone-salmeterol (ADVAIR) 250-50 MCG/ACT AEPB INHALE 1 PUFF TWICE DAILY **RINSE MOUTH AFTERWARDS**   irbesartan (AVAPRO) 150 MG tablet TAKE 1 TABLET BY MOUTH EVERY DAY   montelukast (SINGULAIR) 10 MG tablet TAKE 1 TABLET BY MOUTH EVERYDAY AT BEDTIME   montelukast (SINGULAIR) 10 MG tablet TAKE 1 TABLET BY MOUTH EVERYDAY AT BEDTIME   predniSONE (DELTASONE) 5 MG tablet Take 1 tablet (5 mg total) by mouth daily. (Patient not taking: Reported on 08/16/2021)   No facility-administered encounter medications on file as of 11/04/2021.    Allergies (verified) Amlodipine, Crestor [rosuvastatin], Livalo [pitavastatin], and Bystolic [nebivolol hcl]   History: Past Medical History:  Diagnosis Date   Asthma    Chronic sinusitis    History of CVA (cerebrovascular accident) 2011   hemorrhagic   HTN (hypertension)    Hyperlipidemia    Nasal polyps    Pneumonia    Past Surgical History:  Procedure Laterality Date   NASAL POLYP EXCISION     x2   TONSILLECTOMY     Family History  Problem Relation Age of Onset   Heart disease Father    Valvular heart disease Father    Neurologic Disorder Mother 556  Similar to MS   Cancer Neg Hx    Early death Neg Hx    Hearing loss Neg Hx    Hyperlipidemia Neg Hx    Hypertension Neg Hx    Stroke Neg Hx    Kidney disease Neg Hx    Social History   Socioeconomic History   Marital status: Married    Spouse name: Lee Blevins   Number of children: Not on file   Years of education: Not on file   Highest education level: Bachelor's degree (e.g., BA, AB, BS)  Occupational History   Occupation: RETIRED    Employer: RETIRED    CommentEducational psychologist   Tobacco Use   Smoking status: Former    Packs/day: 1.00    Years: 1.00    Total pack years: 1.00    Types: Cigarettes    Quit date: 04/17/1973    Years since quitting: 48.5   Smokeless tobacco: Never   Tobacco comments:    1975  Vaping Use   Vaping Use: Never used  Substance and Sexual Activity   Alcohol use: Yes    Alcohol/week: 15.0 standard drinks of alcohol    Types: 15 Glasses of wine per week    Comment: 02/21/21 7 a week   Drug use: No   Sexual activity: Not Currently  Other Topics Concern   Not on file  Social History Narrative   02/21/21 lives with wife   Regular Exercise -  YES   Social Determinants of Health   Financial Resource Strain: Low Risk  (11/04/2021)   Overall Financial Resource Strain (CARDIA)    Difficulty of Paying Living Expenses: Not hard at all  Food Insecurity: No Food Insecurity (11/04/2021)   Hunger Vital Sign    Worried About Running Out of Food in the Last Year: Never true    Ran Out of Food in the Last Year: Never true  Transportation Needs: No Transportation Needs (11/04/2021)   PRAPARE - Hydrologist (Medical): No    Lack of Transportation (Non-Medical): No  Physical Activity: Sufficiently Active (11/04/2021)   Exercise Vital Sign    Days of Exercise per Week: 5 days    Minutes of Exercise per Session: 30 min  Stress: No Stress Concern Present (11/04/2021)   [place]    Feeling of Stress : Not at all  Social Connections: [place] (11/04/2021)   Social Connection and Isolation Panel [NHANES]    Frequency of Communication with Friends and Family: More than three times a week    Frequency of Social Gatherings with Friends and Family: More than three times a week    Attends Religious Services: More than 4 times per year    Active Member of [employer] or Organizations: Yes    Attends Music therapist: More than 4 times per year     Marital Status: Married    Tobacco Counseling Counseling given: Not Answered Tobacco comments: 1975   Clinical Intake:  Pre-visit preparation completed: Yes  Pain : No/denies pain     BMI - recorded: 23.19 Nutritional Status: BMI of 19-24  Normal Nutritional Risks: None Diabetes: No  How often do you need to have someone help you when you read instructions, pamphlets, or other written materials from your doctor or pharmacy?: 1 - Never What is the last grade level you completed in school?: Bachelor's degree  Diabetic? no  Interpreter Needed?: No  Information entered by ::  Lisette Abu, LPN.   Activities of Daily Living    11/04/2021    9:56 AM 01/17/2021    4:58 PM  In your present state of health, do you have any difficulty performing the following activities:  Hearing? 0 0  Vision? 0 0  Difficulty concentrating or making decisions? 0 1  Walking or climbing stairs? 0 0  Dressing or bathing? 0 1  Doing errands, shopping? 0 1  Preparing Food and eating ? N   Using the Toilet? N   In the past six months, have you accidently leaked urine? N   Do you have problems with loss of bowel control? N   Managing your Medications? N   Managing your Finances? N   Housekeeping or managing your Housekeeping? N     Patient Care Team: Janith Lima, MD as PCP - General (Internal Medicine)  Indicate any recent Medical Services you may have received from other than [facility] providers in the past year (date may be approximate).     Assessment:   This is a routine wellness examination for [place].  Hearing/Vision screen Hearing Screening - Comments:: Patient denied any hearing difficulty.   No hearing aids.  Vision Screening - Comments:: Patient does wear corrective lenses/contacts.  Eye exam done by: Karie Georges, OD.   Dietary issues and exercise activities discussed: Current Exercise Habits: Home exercise routine, Type of exercise: walking (very active gardening, lawn  care), Time (Minutes): 30, Frequency (Times/Week): 5, Weekly Exercise (Minutes/Week): 150, Intensity: Mild, Exercise limited by: respiratory conditions(s)   Goals Addressed             This Visit's Progress    My goal is to keep being active.        Depression Screen    11/04/2021    9:55 AM 08/16/2021   11:02 AM 02/08/2021   11:51 AM 06/19/2018    1:25 PM 06/24/2014   11:39 AM 10/23/2012    1:27 PM  PHQ 2/9 Scores  PHQ - 2 Score 0 0 0 0 0 0  PHQ- 9 Score   0       Fall Risk    11/04/2021    9:53 AM 08/16/2021   11:02 AM 02/08/2021   11:51 AM 06/20/2018   10:46 AM 06/19/2018    1:25 PM  Fall Risk   Falls in the past year? 0 0 0 0 0  Number falls in past yr: 0 0 0  0  Injury with Fall? 0    0  Risk for fall due to : No Fall Risks      Follow up Falls evaluation completed    Falls evaluation completed    [place]:  Any stairs in or around the home? Yes  If so, are there any without handrails? No  Home free of loose throw rugs in walkways, pet beds, electrical cords, etc? Yes  Adequate lighting in your home to reduce risk of falls? Yes   ASSISTIVE DEVICES UTILIZED TO PREVENT FALLS:  Life alert? No  Use of a cane, walker or w/c? No  Grab bars in the bathroom? No  Shower chair or bench in shower? No  Elevated toilet seat or a handicapped toilet? Yes   TIMED UP AND GO:  Was the test performed? No .  Length of time to ambulate 10 feet: n/a sec.   Appearance of gait: Gait not evaluated during this visit.  Cognitive Function:    08/03/2021  3:02 PM  MMSE - Mini Mental State Exam  Orientation to time 3  Orientation to Place 3  Registration 3  Attention/ Calculation 0  Recall 2  Language- name 2 objects 2  Language- repeat 0  Language- follow 3 step command 3  Language- read & follow direction 1  Write a sentence 1  Copy design 0  Total score 18        11/04/2021    9:59 AM  6CIT Screen  What Year? 0 points  What month? 0  points  What time? 0 points  Count back from 20 0 points  Months in reverse 0 points  Repeat phrase 0 points  Total Score 0 points    Immunizations Immunization History  Administered Date(s) Administered   Fluad Quad(high Dose 65+) 12/09/2018, 03/06/2020, 03/16/2021   Influenza Split 01/17/2011, 01/31/2012   Influenza Whole 02/18/2008, 04/07/2010   Influenza, High Dose Seasonal PF 12/14/2015, 03/02/2017, 02/09/2018   Influenza,inj,Quad PF,6+ Mos 01/13/2014, 12/31/2014   Influenza-Unspecified 01/29/2013   PFIZER(Purple Top)SARS-COV-2 Vaccination 05/03/2019, 05/23/2019   Pneumococcal Conjugate-13 06/24/2014   Pneumococcal Polysaccharide-23 02/18/2008, 04/07/2010, 03/27/2019   Tdap 08/05/2010, 12/09/2018    TDAP status: Up to date  Flu Vaccine status: Up to date  Pneumococcal vaccine status: Up to date  Covid-19 vaccine status: Completed vaccines  Qualifies for Shingles Vaccine? Yes   Zostavax completed No   Shingrix Completed?: No.    Education has been provided regarding the importance of this vaccine. Patient has been advised to call insurance company to determine out of pocket expense if they have not yet received this vaccine. Advised may also receive vaccine at local pharmacy or Health Dept. Verbalized acceptance and understanding.  Screening Tests Health Maintenance  Topic Date Due   Zoster Vaccines- Shingrix (1 of 2) Never done   COVID-19 Vaccine (3 - Pfizer series) 07/18/2019   INFLUENZA VACCINE  11/15/2021   TETANUS/TDAP  12/08/2028   Pneumonia Vaccine 34+ Years old  Completed   Hepatitis C Screening  Completed   HPV VACCINES  Aged Out   COLONOSCOPY (Pts 45-1yr Insurance coverage will need to be confirmed)  [place] Due  Topic Date Due   Zoster Vaccines- Shingrix (1 of 2) Never done   COVID-19 Vaccine (3 - Pfizer series) 07/18/2019    Colorectal cancer screening: No longer required.   Lung Cancer  Screening: (Low Dose CT Chest recommended if Age 128-80years, 30 pack-year currently smoking OR have quit w/in 15years.) does not qualify.   Lung Cancer Screening Referral: no  Additional Screening:  Hepatitis C Screening: does qualify; Completed 12/14/2015  Vision Screening: Recommended annual ophthalmology exams for early detection of glaucoma and other disorders of the eye. Is the patient up to date with their annual eye exam?  Yes  Who is the provider or what is the name of the office in which the patient attends annual eye exams? Stacey Hutto, OD. If pt is not established with a provider, would they like to be referred to a provider to establish care? No .   Dental Screening: Recommended annual dental exams for proper oral hygiene  Community Resource Referral / Chronic Care Management: CRR required this visit?  No   CCM required this visit?  No      Plan:     I have personally reviewed and noted the following in the patient's chart:   Medical and social history Use of alcohol, tobacco or illicit drugs  Current medications and supplements including opioid prescriptions. Patient is not currently taking opioid prescriptions. Functional ability and status Nutritional status Physical activity Advanced directives List of other physicians Hospitalizations, surgeries, and ER visits in previous 12 months Vitals Screenings to include cognitive, depression, and falls Referrals and appointments  In addition, I have reviewed and discussed with patient certain preventive protocols, quality metrics, and best practice recommendations. A written personalized care plan for preventive services as well as general preventive health recommendations were provided to patient.     Sheral Flow, LPN   4/46/2863   Nurse Notes:  Patient is cogitatively intact. There were no vitals filed for this visit. There is no height or weight on file to calculate BMI. Patient stated that he has  no issues with gait or balance; does not use any assistive devices.

## 2021-12-12 ENCOUNTER — Ambulatory Visit: Payer: Medicare Other | Admitting: Physical Medicine and Rehabilitation

## 2021-12-18 ENCOUNTER — Other Ambulatory Visit: Payer: Self-pay | Admitting: Internal Medicine

## 2021-12-18 DIAGNOSIS — I10 Essential (primary) hypertension: Secondary | ICD-10-CM

## 2022-02-02 ENCOUNTER — Ambulatory Visit: Payer: Medicare Other | Admitting: Adult Health

## 2022-02-22 ENCOUNTER — Other Ambulatory Visit: Payer: Self-pay | Admitting: Internal Medicine

## 2022-02-23 NOTE — Telephone Encounter (Signed)
Advair refilled.

## 2022-02-24 ENCOUNTER — Other Ambulatory Visit: Payer: Self-pay | Admitting: Internal Medicine

## 2022-02-27 NOTE — Telephone Encounter (Signed)
Montelukast refilled

## 2022-09-29 ENCOUNTER — Other Ambulatory Visit: Payer: Self-pay | Admitting: Internal Medicine

## 2022-09-29 DIAGNOSIS — I10 Essential (primary) hypertension: Secondary | ICD-10-CM

## 2022-10-04 ENCOUNTER — Telehealth: Payer: Self-pay | Admitting: Internal Medicine

## 2022-10-04 NOTE — Telephone Encounter (Signed)
Prescription Request  10/04/2022  LOV: 07/21/2021  What is the name of the medication or equipment?  irbesartan (AVAPRO) 150 MG tablet  Have you contacted your pharmacy to request a refill? Yes   Which pharmacy would you like this sent to?  [id number] - [place], [place] - [address] AT [address] OF [facility] [place] [address] Phone: [phone] Fax: [phone]    Patient notified that their request is being sent to the clinical staff for review and that they should receive a response within 2 business days.   Please advise at Mobile [phone] (mobile)    Patient has appointment scheduled for 10/30/2022. He is out of this medication.

## 2022-10-04 NOTE — Telephone Encounter (Signed)
No labs in over a year He has to be seen

## 2022-10-05 NOTE — Telephone Encounter (Signed)
Pt wife wanted to know is Dr. Yetta Barre okay with pt going over a month without his medication. Please advise.

## 2022-10-30 ENCOUNTER — Ambulatory Visit (INDEPENDENT_AMBULATORY_CARE_PROVIDER_SITE_OTHER): Payer: Medicare Other | Admitting: Internal Medicine

## 2022-10-30 ENCOUNTER — Encounter: Payer: Self-pay | Admitting: Internal Medicine

## 2022-10-30 VITALS — BP 168/88 | HR 83 | Temp 98.1°F | Resp 16 | Ht 72.0 in | Wt 178.0 lb

## 2022-10-30 DIAGNOSIS — K7469 Other cirrhosis of liver: Secondary | ICD-10-CM | POA: Diagnosis not present

## 2022-10-30 DIAGNOSIS — T380X5A Adverse effect of glucocorticoids and synthetic analogues, initial encounter: Secondary | ICD-10-CM | POA: Diagnosis not present

## 2022-10-30 DIAGNOSIS — Z888 Allergy status to other drugs, medicaments and biological substances status: Secondary | ICD-10-CM

## 2022-10-30 DIAGNOSIS — E785 Hyperlipidemia, unspecified: Secondary | ICD-10-CM

## 2022-10-30 DIAGNOSIS — I426 Alcoholic cardiomyopathy: Secondary | ICD-10-CM

## 2022-10-30 DIAGNOSIS — I2510 Atherosclerotic heart disease of native coronary artery without angina pectoris: Secondary | ICD-10-CM

## 2022-10-30 DIAGNOSIS — M818 Other osteoporosis without current pathological fracture: Secondary | ICD-10-CM | POA: Diagnosis not present

## 2022-10-30 DIAGNOSIS — I10 Essential (primary) hypertension: Secondary | ICD-10-CM | POA: Diagnosis not present

## 2022-10-30 NOTE — Patient Instructions (Signed)
Health Maintenance, Male Adopting a healthy lifestyle and getting preventive care are important in promoting health and wellness. Ask your health care provider about: The right schedule for you to have regular tests and exams. Things you can do on your own to prevent diseases and keep yourself healthy. What should I know about diet, weight, and exercise? Eat a healthy diet  Eat a diet that includes plenty of vegetables, fruits, low-fat dairy products, and lean protein. Do not eat a lot of foods that are high in solid fats, added sugars, or sodium. Maintain a healthy weight Body mass index (BMI) is a measurement that can be used to identify possible weight problems. It estimates body fat based on height and weight. Your health care provider can help determine your BMI and help you achieve or maintain a healthy weight. Get regular exercise Get regular exercise. This is one of the most important things you can do for your health. Most adults should: Exercise for at least 150 minutes each week. The exercise should increase your heart rate and make you sweat (moderate-intensity exercise). Do strengthening exercises at least twice a week. This is in addition to the moderate-intensity exercise. Spend less time sitting. Even light physical activity can be beneficial. Watch cholesterol and blood lipids Have your blood tested for lipids and cholesterol at 79 years of age, then have this test every 5 years. You may need to have your cholesterol levels checked more often if: Your lipid or cholesterol levels are high. You are older than 79 years of age. You are at high risk for heart disease. What should I know about cancer screening? Many types of cancers can be detected early and may often be prevented. Depending on your health history and family history, you may need to have cancer screening at various ages. This may include screening for: Colorectal cancer. Prostate cancer. Skin cancer. Lung  cancer. What should I know about heart disease, diabetes, and high blood pressure? Blood pressure and heart disease High blood pressure causes heart disease and increases the risk of stroke. This is more likely to develop in people who have high blood pressure readings or are overweight. Talk with your health care provider about your target blood pressure readings. Have your blood pressure checked: Every 3-5 years if you are 18-39 years of age. Every year if you are 40 years old or older. If you are between the ages of 65 and 75 and are a current or former smoker, ask your health care provider if you should have a one-time screening for abdominal aortic aneurysm (AAA). Diabetes Have regular diabetes screenings. This checks your fasting blood sugar level. Have the screening done: Once every three years after age 45 if you are at a normal weight and have a low risk for diabetes. More often and at a younger age if you are overweight or have a high risk for diabetes. What should I know about preventing infection? Hepatitis B If you have a higher risk for hepatitis B, you should be screened for this virus. Talk with your health care provider to find out if you are at risk for hepatitis B infection. Hepatitis C Blood testing is recommended for: Everyone born from 1945 through 1965. Anyone with known risk factors for hepatitis C. Sexually transmitted infections (STIs) You should be screened each year for STIs, including gonorrhea and chlamydia, if: You are sexually active and are younger than 79 years of age. You are older than 79 years of age and your   health care provider tells you that you are at risk for this type of infection. Your sexual activity has changed since you were last screened, and you are at increased risk for chlamydia or gonorrhea. Ask your health care provider if you are at risk. Ask your health care provider about whether you are at high risk for HIV. Your health care provider  may recommend a prescription medicine to help prevent HIV infection. If you choose to take medicine to prevent HIV, you should first get tested for HIV. You should then be tested every 3 months for as long as you are taking the medicine. Follow these instructions at home: Alcohol use Do not drink alcohol if your health care provider tells you not to drink. If you drink alcohol: Limit how much you have to 0-2 drinks a day. Know how much alcohol is in your drink. In the [place] one drink equals one 12 oz bottle of beer (355 mL), one 5 oz glass of wine (148 mL), or one 1 oz glass of hard liquor (44 mL). Lifestyle Do not use any products that contain nicotine or tobacco. These products include cigarettes, chewing tobacco, and vaping devices, such as e-cigarettes. If you need help quitting, ask your health care provider. Do not use street drugs. Do not share needles. Ask your health care provider for help if you need support or information about quitting drugs. General instructions Schedule regular health, dental, and eye exams. Stay current with your vaccines. Tell your health care provider if: You often feel depressed. You have ever been abused or do not feel safe at home. Summary Adopting a healthy lifestyle and getting preventive care are important in promoting health and wellness. Follow your health care provider's instructions about healthy diet, exercising, and getting tested or screened for diseases. Follow your health care provider's instructions on monitoring your cholesterol and blood pressure. This information is not intended to replace advice given to you by your health care provider. Make sure you discuss any questions you have with your health care provider. Document Revised: 08/23/2020 Document Reviewed: 08/23/2020 Elsevier Patient Education  2024 [employer]  

## 2022-10-30 NOTE — Progress Notes (Signed)
Subjective:  Patient ID: Lee Blevins, male    DOB: May 28, 1945  Age: 77 y.o. MRN: [id number]  CC: Annual Exam, Coronary Artery Disease, Hypertension, and Hyperlipidemia   HPI Lee Blevins presents for a CPX and f/up ---  Discussed the use of AI scribe software for clinical note transcription with the patient, who gave verbal consent to proceed.  History of Present Illness   The patient, with a history of respiratory issues, presents for a routine follow-up and medication refill. They report no recent chest pain, shortness of breath, high blood pressure symptoms, or episodes of dizziness or lightheadedness. They have been maintaining an active lifestyle, engaging in yard work and beach visits.  They have had recent ophthalmologic issues, specifically cataracts, which have been addressed but still require the use of reading glasses. Their respiratory management includes the use of inhalers and prednisone. They have not seen a pulmonologist in approximately a year and a half to two years.       Outpatient Medications Prior to Visit  Medication Sig Dispense Refill   ADVAIR DISKUS 250-50 MCG/ACT AEPB INHALE 1 INHALATION TWICE DAILY *RINSE MOUTH AFTERWARDS* 180 each 3   albuterol (VENTOLIN HFA) 108 (90 Base) MCG/ACT inhaler TAKE 2 PUFFS BY MOUTH EVERY 6 HOURS AS NEEDED FOR WHEEZE OR SHORTNESS OF BREATH 8.5 each 8   montelukast (SINGULAIR) 10 MG tablet TAKE 1 TABLET BY MOUTH EVERYDAY AT BEDTIME 90 tablet 3   montelukast (SINGULAIR) 10 MG tablet TAKE 1 TABLET BY MOUTH EVERYDAY AT BEDTIME 90 tablet 4   irbesartan (AVAPRO) 150 MG tablet TAKE 1 TABLET BY MOUTH EVERY DAY 90 tablet 0   predniSONE (DELTASONE) 5 MG tablet Take 1 tablet (5 mg total) by mouth daily. 30 tablet 0   No facility-administered medications prior to visit.    ROS Review of Systems  Constitutional:  Negative for appetite change, chills, diaphoresis, fatigue, fever and unexpected weight change.  HENT: Negative.  Negative for  sore throat and trouble swallowing.   Eyes: Negative.   Respiratory:  Positive for shortness of breath. Negative for cough, chest tightness and wheezing.   Cardiovascular:  Negative for chest pain, palpitations and leg swelling.  Gastrointestinal:  Negative for abdominal pain, constipation, diarrhea, nausea and vomiting.  Endocrine: Negative.   Genitourinary: Negative.  Negative for difficulty urinating and dysuria.  Musculoskeletal: Negative.  Negative for arthralgias, joint swelling and myalgias.  Skin: Negative.  Negative for color change and pallor.  Neurological: Negative.  Negative for dizziness and weakness.  Hematological:  Negative for adenopathy. Does not bruise/bleed easily.  Psychiatric/Behavioral: Negative.      Objective:  BP (!) 168/88 (BP Location: Right Arm, Patient Position: Sitting, Cuff Size: Large)   Pulse 83   Temp 98.1 F (36.7 C) (Oral)   Resp 16   Ht 6' (1.829 m)   Wt 178 lb (80.7 kg)   SpO2 94%   BMI 24.14 kg/m   BP Readings from Last 3 Encounters:  10/30/22 (!) 168/88  08/16/21 (!) 165/87  08/03/21 (!) 148/80    Wt Readings from Last 3 Encounters:  10/30/22 178 lb (80.7 kg)  08/16/21 171 lb (77.6 kg)  08/03/21 171 lb (77.6 kg)    Physical Exam Vitals reviewed.  Constitutional:      General: He is not in acute distress.    Appearance: He is ill-appearing. He is not toxic-appearing or diaphoretic.  HENT:     Nose: Nose normal.     Mouth/Throat:  Mouth: Mucous membranes are moist.  Eyes:     General: No scleral icterus.    Conjunctiva/sclera: Conjunctivae normal.  Cardiovascular:     Rate and Rhythm: Normal rate and regular rhythm.     Heart sounds: S1 normal and S2 normal. Murmur heard.     Systolic murmur is present with a grade of 2/6.     No gallop.     Comments: EKG- SR with 1 st degree AV block, 70 bpm LAD Septal and inferior infarct patterns - new No LVH Pulmonary:     Effort: Pulmonary effort is normal.     Breath  sounds: No stridor. No wheezing, rhonchi or rales.  Abdominal:     General: Abdomen is flat.     Palpations: There is no mass.     Tenderness: There is no abdominal tenderness. There is no guarding or rebound.     Hernia: No hernia is present.  Musculoskeletal:     Cervical back: Neck supple.     Right lower leg: No edema.     Left lower leg: No edema.  Skin:    General: Skin is warm and dry.     Findings: No rash.  Neurological:     General: No focal deficit present.     Mental Status: He is alert. Mental status is at baseline.  Psychiatric:        Mood and Affect: Mood normal.        Behavior: Behavior normal.     Lab Results  Component Value Date   WBC 6.7 10/31/2022   HGB 14.0 10/31/2022   HCT 43.1 10/31/2022   PLT 221.0 10/31/2022   GLUCOSE 105 (H) 10/31/2022   CHOL 244 (H) 10/31/2022   TRIG 53.0 10/31/2022   HDL 103.10 10/31/2022   LDLDIRECT 160.0 06/13/2013   LDLCALC 131 (H) 10/31/2022   ALT 12 10/31/2022   AST 18 10/31/2022   NA 138 10/31/2022   K 4.1 10/31/2022   CL 103 10/31/2022   CREATININE 0.97 10/31/2022   BUN 21 10/31/2022   CO2 29 10/31/2022   TSH 2.16 10/31/2022   PSA 0.63 06/19/2018   INR 1.1 (H) 10/31/2022   HGBA1C 5.2 01/12/2021    No results found.  Assessment & Plan:   Other cirrhosis of liver (HCC)- MELD is 7 -     Hepatic function panel; Future -     Protime-INR; Future  Essential hypertension- Will try to get better BP control. -     EKG 12-Lead -     TSH; Future -     Urinalysis, Routine w reflex microscopic; Future -     CBC with Differential/Platelet; Future -     Basic metabolic panel; Future -     Irbesartan; Take 1 tablet (150 mg total) by mouth daily.  Dispense: 90 tablet; Refill: 0 -     Indapamide; Take 1 tablet (1.25 mg total) by mouth daily.  Dispense: 90 tablet; Refill: 0  Coronary artery disease involving native coronary artery of native heart without angina pectoris -     EKG 12-Lead -     Lipid panel; Future -      Troponin I (High Sensitivity); Future -     Ambulatory referral to Cardiology  Alcoholic cardiomyopathy [facility]) -     Ambulatory referral to Cardiology  Hyperlipidemia with target LDL less than 70 -     Lipid panel; Future -     TSH; Future -  Hepatic function panel; Future  Steroid-induced osteoporosis -     VITAMIN D 25 Hydroxy (Vit-D Deficiency, Fractures); Future -     Phosphorus; Future  Allergy to statin medication- He will not take a statin     Follow-up: Return in about 3 months (around 01/30/2023).  Sanda Linger, MD

## 2022-10-31 LAB — PROTIME-INR
INR: 1.1 ratio — ABNORMAL HIGH (ref 0.8–1.0)
Prothrombin Time: 11.8 s (ref 9.6–13.1)

## 2022-10-31 LAB — CBC WITH DIFFERENTIAL/PLATELET
Basophils Absolute: 0 10*3/uL (ref 0.0–0.1)
Basophils Relative: 0.5 % (ref 0.0–3.0)
Eosinophils Absolute: 0.1 10*3/uL (ref 0.0–0.7)
Eosinophils Relative: 1.8 % (ref 0.0–5.0)
HCT: 43.1 % (ref 39.0–52.0)
Hemoglobin: 14 g/dL (ref 13.0–17.0)
Lymphocytes Relative: 8.9 % — ABNORMAL LOW (ref 12.0–46.0)
Lymphs Abs: 0.6 10*3/uL — ABNORMAL LOW (ref 0.7–4.0)
MCHC: 32.5 g/dL (ref 30.0–36.0)
MCV: 97.8 fl (ref 78.0–100.0)
Monocytes Absolute: 0.5 10*3/uL (ref 0.1–1.0)
Monocytes Relative: 7.8 % (ref 3.0–12.0)
Neutro Abs: 5.5 10*3/uL (ref 1.4–7.7)
Neutrophils Relative %: 81 % — ABNORMAL HIGH (ref 43.0–77.0)
Platelets: 221 10*3/uL (ref 150.0–400.0)
RBC: 4.41 Mil/uL (ref 4.22–5.81)
RDW: 12.6 % (ref 11.5–15.5)
WBC: 6.7 10*3/uL (ref 4.0–10.5)

## 2022-10-31 LAB — BASIC METABOLIC PANEL
BUN: 21 mg/dL (ref 6–23)
CO2: 29 mEq/L (ref 19–32)
Calcium: 10.3 mg/dL (ref 8.4–10.5)
Chloride: 103 mEq/L (ref 96–112)
Creatinine, Ser: 0.97 mg/dL (ref 0.40–1.50)
GFR: 74.55 mL/min (ref >60.00)
Glucose, Bld: 105 mg/dL — ABNORMAL HIGH (ref 70–99)
Potassium: 4.1 mEq/L (ref 3.5–5.1)
Sodium: 138 mEq/L (ref 135–145)

## 2022-10-31 LAB — HEPATIC FUNCTION PANEL
ALT: 12 U/L (ref 0–53)
AST: 18 U/L (ref 0–37)
Albumin: 4.3 g/dL (ref 3.5–5.2)
Alkaline Phosphatase: 63 U/L (ref 39–117)
Bilirubin, Direct: 0.2 mg/dL (ref 0.0–0.3)
Total Bilirubin: 0.8 mg/dL (ref 0.2–1.2)
Total Protein: 7.6 g/dL (ref 6.0–8.3)

## 2022-10-31 LAB — PHOSPHORUS: Phosphorus: 2.6 mg/dL (ref 2.3–4.6)

## 2022-10-31 LAB — LIPID PANEL
Cholesterol: 244 mg/dL — ABNORMAL HIGH (ref 0–200)
HDL: 103.1 mg/dL (ref >39.00)
LDL Cholesterol: 131 mg/dL — ABNORMAL HIGH (ref 0–99)
NonHDL: 141.32
Total CHOL/HDL Ratio: 2
Triglycerides: 53 mg/dL (ref 0.0–149.0)
VLDL: 10.6 mg/dL (ref 0.0–40.0)

## 2022-11-01 LAB — URINALYSIS, ROUTINE W REFLEX MICROSCOPIC
Bilirubin Urine: NEGATIVE
Hgb urine dipstick: NEGATIVE
Ketones, ur: NEGATIVE
Leukocytes,Ua: NEGATIVE
Nitrite: NEGATIVE
RBC / HPF: NONE SEEN (ref 0–?)
Specific Gravity, Urine: >=1.03 — AB (ref 1.000–1.030)
Total Protein, Urine: NEGATIVE
Urine Glucose: NEGATIVE
Urobilinogen, UA: 0.2 (ref 0.0–1.0)
WBC, UA: NONE SEEN (ref 0–?)
pH: 5.5 (ref 5.0–8.0)

## 2022-11-01 LAB — TSH: TSH: 2.16 u[IU]/mL (ref 0.35–5.50)

## 2022-11-01 LAB — VITAMIN D 25 HYDROXY (VIT D DEFICIENCY, FRACTURES): VITD: 28.15 ng/mL — ABNORMAL LOW (ref 30.00–100.00)

## 2022-11-01 LAB — TROPONIN I (HIGH SENSITIVITY): High Sens Troponin I: 10 ng/L (ref 2–17)

## 2022-11-01 MED ORDER — IRBESARTAN 150 MG PO TABS
150.0000 mg | ORAL_TABLET | Freq: Every day | ORAL | 0 refills | Status: DC
Start: 2022-11-01 — End: 2023-01-28

## 2022-11-01 MED ORDER — INDAPAMIDE 1.25 MG PO TABS
1.2500 mg | ORAL_TABLET | Freq: Every day | ORAL | 0 refills | Status: DC
Start: 2022-11-01 — End: 2023-01-28

## 2022-11-28 NOTE — Progress Notes (Unsigned)
Cardiology Office Note:  .    Date:  11/30/2022  ID:  Lee Blevins, DOB 10-24-1945, MRN [id number] PCP: Etta Grandchild, MD  [facility] Providers Cardiologist:  None     CC: Told to come Consulted for the evaluation of CAD at the behest of Dr. Yetta Barre  History of Present Illness: .    Lee Blevins is a 77 y.o. male HLD, HTN, and prior hemorrhagic stroke. 2017- managed by Dr. Okey Dupre.  Patient notes that he is feeling great.   He is retired.  He does yard work and does a lot of time at [employer] at [employer]. No functional limitations. Is able to patch a a hole in his roof. Was a former Occupational hygienist.   Has had no chest pain, chest pressure, chest tightness, chest stinging.  No shortness of breath, DOE .  No PND or orthopnea.  No weight gain, leg swelling , or abdominal swelling.  No syncope or near syncope . Notes  no palpitations or funny heart beats.     Patient reports prior cardiac testing including no other cardiac testing beside the test below.   Relevant histories: .  Social Comes with wife ROS: As per HPI.   Studies Reviewed: .   Cardiac Studies & Procedures     STRESS TESTS  MYOCARDIAL PERFUSION IMAGING 12/22/2015  Narrative  The left ventricular ejection fraction is mildly decreased (45-54%).  Nuclear stress EF: 50%.  There was no ST segment deviation noted during stress.  There is a medium partially reversible defect of moderate severity present in the basal inferior, mid inferior and apical inferior location and a small fixed defect of moderate severity present in the basal inferoseptal and mid inferoseptal location. In setting of normal wall motion, this is most consistent with variations in diaphragmatic attenuation artifact but cannot rule out prior infarct with peri infarct ischemia in the inferior wall. Clinical correlation recommended.  This is an intermediate risk study.   ECHOCARDIOGRAM  ECHOCARDIOGRAM COMPLETE 01/13/2021  Narrative ECHOCARDIOGRAM  REPORT    Patient Name:   Lee Blevins Date of Exam: 01/13/2021 Medical Rec #:  [id number]     Height:       73.0 in Accession #:    [id number]    Weight:       170.0 lb Date of Birth:  02/08/1946      BSA:          2.008 m Patient Age:    75 years      BP:           123/69 mmHg Patient Gender: M             HR:           72 bpm. Exam Location:  Inpatient  Procedure: 2D Echo, Cardiac Doppler and Color Doppler  Indications:    Stroke I63.9  History:        Patient has prior history of Echocardiogram examinations, most recent 02/03/2016.  Sonographer:    Roosvelt Maser RDCS Referring Phys: [id number] [facility]  IMPRESSIONS   1. Left ventricular ejection fraction, by estimation, is 55 to 60%. The left ventricle has normal function. Left ventricular endocardial border not optimally defined to evaluate regional wall motion. Left ventricular diastolic parameters are consistent with Grade I diastolic dysfunction (impaired relaxation). 2. Right ventricular systolic function is normal. The right ventricular size is normal. Tricuspid regurgitation signal is inadequate for assessing PA pressure. 3. The mitral valve is grossly normal.  No evidence of mitral valve regurgitation. No evidence of mitral stenosis. 4. The aortic valve is calcified. Aortic valve regurgitation is trivial. Mild aortic valve stenosis. Aortic valve mean gradient measures 15.0 mmHg. Aortic valve Vmax measures 2.57 m/s. Decreased DVI 0.30. 5. The inferior vena cava is normal in size with greater than 50% respiratory variability, suggesting right atrial pressure of 3 mmHg.  Comparison(s): A prior study was performed on 02/03/2016. Increase in aortic valve calcium and gradients.  FINDINGS Left Ventricle: Left ventricular ejection fraction, by estimation, is 55 to 60%. The left ventricle has normal function. Left ventricular endocardial border not optimally defined to evaluate regional wall motion. The left ventricular  internal cavity size was normal in size. There is no left ventricular hypertrophy. Left ventricular diastolic parameters are consistent with Grade I diastolic dysfunction (impaired relaxation).  Right Ventricle: The right ventricular size is normal. No increase in right ventricular wall thickness. Right ventricular systolic function is normal. Tricuspid regurgitation signal is inadequate for assessing PA pressure.  Left Atrium: Left atrial size was normal in size.  Right Atrium: Right atrial size was normal in size.  Pericardium: There is no evidence of pericardial effusion.  Mitral Valve: The mitral valve is grossly normal. There is mild thickening of the mitral valve leaflet(s). No evidence of mitral valve regurgitation. No evidence of mitral valve stenosis.  Tricuspid Valve: The tricuspid valve is normal in structure. Tricuspid valve regurgitation is trivial. No evidence of tricuspid stenosis.  Aortic Valve: The aortic valve is calcified. Aortic valve regurgitation is trivial. Mild aortic stenosis is present. Aortic valve mean gradient measures 15.0 mmHg. Aortic valve peak gradient measures 26.4 mmHg. Aortic valve area, by VTI measures 0.93 cm.  Pulmonic Valve: The pulmonic valve was not well visualized. Pulmonic valve regurgitation is not visualized.  Aorta: The aortic root is normal in size and structure.  Venous: The inferior vena cava is normal in size with greater than 50% respiratory variability, suggesting right atrial pressure of 3 mmHg.  IAS/Shunts: The atrial septum is grossly normal.   LEFT VENTRICLE PLAX 2D LVIDd:         5.00 cm  Diastology LVIDs:         3.20 cm  LV e' medial:    6.31 cm/s LV PW:         1.00 cm  LV E/e' medial:  8.6 LV IVS:        0.90 cm  LV e' lateral:   7.72 cm/s LVOT diam:     2.00 cm  LV E/e' lateral: 7.0 LV SV:         47 LV SV Index:   24 LVOT Area:     3.14 cm   RIGHT VENTRICLE RV Basal diam:  3.30 cm  LEFT ATRIUM              Index       RIGHT ATRIUM           Index LA diam:        3.40 cm 1.69 cm/m  RA Area:     22.50 cm LA Vol (A2C):   70.5 ml 35.11 ml/m RA Volume:   70.40 ml  35.06 ml/m LA Vol (A4C):   41.6 ml 20.72 ml/m LA Biplane Vol: 54.2 ml 26.99 ml/m AORTIC VALVE AV Area (Vmax):    0.93 cm AV Area (Vmean):   0.92 cm AV Area (VTI):     0.93 cm AV Vmax:  257.00 cm/s AV Vmean:          180.000 cm/s AV VTI:            0.508 m AV Peak Grad:      26.4 mmHg AV Mean Grad:      15.0 mmHg LVOT Vmax:         76.05 cm/s LVOT Vmean:        52.500 cm/s LVOT VTI:          0.150 m LVOT/AV VTI ratio: 0.30  AORTA Ao Root diam: 3.40 cm  MITRAL VALVE MV Area (PHT): 2.91 cm    SHUNTS MV Decel Time: 261 msec    Systemic VTI:  0.15 m MV E velocity: 54.00 cm/s  Systemic Diam: 2.00 cm MV A velocity: 78.00 cm/s MV E/A ratio:  0.69  Riley Lam MD Electronically signed by Riley Lam MD Signature Date/Time: 01/13/2021/3:28:47 PM    Final              Physical Exam:    VS:  BP (!) 144/70   Pulse 75   Ht 6' (1.829 m)   Wt 175 lb (79.4 kg)   SpO2 97%   BMI 23.73 kg/m    Wt Readings from Last 3 Encounters:  11/30/22 175 lb (79.4 kg)  10/30/22 178 lb (80.7 kg)  08/16/21 171 lb (77.6 kg)    Gen: no distress   Neck: No JVD Ears:  Homero Fellers Sign Cardiac: No Rubs or Gallops, systolic Murmur, RRR +2 radial pulses Respiratory: Clear to auscultation bilaterally, normal effort, normal  respiratory rate GI: Soft, nontender, non-distended  MS: No  edema;  moves all extremities Integument: Skin feels well Neuro:  At time of evaluation, alert and oriented to person/place/time/situation  Psych: Normal affect, patient feels well  ASSESSMENT AND PLAN: .    Mild AS and AI - Asymptomatic, heart murmur more pronounced than expected, will get echo  Labil eHTN - ABP 110, they are worried cuff may be wrong, will continue Lozol and Avapro; will get nurse visit with BP cuff, may  increase ARB dose  CAD Aortic atherosclerosis HLD - Reviewed Chest CT with patient and family - discussed dietary changes involving ice cream and pretzels - zetia 10 mg and labs in three months   One year me or APP unless new sx  Riley Lam, MD F[facility] Cardiologist [facility]  [address] [place] [place], [place] [address] [phone]  3:55 PM

## 2022-11-29 ENCOUNTER — Other Ambulatory Visit: Payer: Self-pay | Admitting: Internal Medicine

## 2022-11-30 ENCOUNTER — Ambulatory Visit: Payer: Medicare Other | Attending: Internal Medicine | Admitting: Internal Medicine

## 2022-11-30 ENCOUNTER — Encounter: Payer: Self-pay | Admitting: Internal Medicine

## 2022-11-30 VITALS — BP 144/70 | HR 75 | Ht 72.0 in | Wt 175.0 lb

## 2022-11-30 DIAGNOSIS — I2584 Coronary atherosclerosis due to calcified coronary lesion: Secondary | ICD-10-CM

## 2022-11-30 DIAGNOSIS — I10 Essential (primary) hypertension: Secondary | ICD-10-CM

## 2022-11-30 DIAGNOSIS — I2510 Atherosclerotic heart disease of native coronary artery without angina pectoris: Secondary | ICD-10-CM | POA: Diagnosis present

## 2022-11-30 DIAGNOSIS — E782 Mixed hyperlipidemia: Secondary | ICD-10-CM

## 2022-11-30 MED ORDER — EZETIMIBE 10 MG PO TABS: 10.0000 mg | ORAL_TABLET | Freq: Every day | ORAL | 3 refills | Status: DC

## 2022-11-30 NOTE — Patient Instructions (Signed)
Medication Instructions:  Your physician has recommended you make the following change in your medication:  START: ezetimibe (Zetia) 10 mg by mouth once daily  *If you need a refill on your cardiac medications before your next appointment, please call your pharmacy*   Lab Work: IN 3 MONTHS: FLP (nothing to eat or drink 8 hours prior except water)  If you have labs (blood work) drawn today and your tests are completely normal, you will receive your results only by: MyChart Message (if you have MyChart) OR A paper copy in the mail If you have any lab test that is abnormal or we need to change your treatment, we will call you to review the results.   Testing/Procedures: Your physician has requested that you have an echocardiogram. Echocardiography is a painless test that uses sound waves to create images of your heart. It provides your doctor with information about the size and shape of your heart and how well your heart's chambers and valves are working. This procedure takes approximately one hour. There are no restrictions for this procedure. Please do NOT wear cologne, perfume, aftershave, or lotions (deodorant is allowed). Please arrive 15 minutes prior to your appointment time.   Your physician would like you to have a Nurse Visit to check your BP.  Please bring your home BP cuff.    Follow-Up: At [facility], you and your health needs are our priority.  As part of our continuing mission to provide you with exceptional heart care, we have created designated Provider Care Teams.  These Care Teams include your primary Cardiologist (physician) and Advanced Practice Providers (APPs -  Physician Assistants and Nurse Practitioners) who all work together to provide you with the care you need, when you need it.  We recommend signing up for the patient portal called "MyChart".  Sign up information is provided on this After Visit Summary.  MyChart is used to connect with patients for  Virtual Visits (Telemedicine).  Patients are able to view lab/test results, encounter notes, upcoming appointments, etc.  Non-urgent messages can be sent to your provider as well.   To learn more about what you can do with MyChart, go to [email or web address].    Your next appointment:   1 year(s)  Provider:   Riley Lam, MD

## 2022-12-15 ENCOUNTER — Ambulatory Visit (HOSPITAL_COMMUNITY): Payer: Medicare Other | Attending: Internal Medicine

## 2022-12-15 DIAGNOSIS — I2584 Coronary atherosclerosis due to calcified coronary lesion: Secondary | ICD-10-CM

## 2022-12-15 DIAGNOSIS — E782 Mixed hyperlipidemia: Secondary | ICD-10-CM | POA: Diagnosis not present

## 2022-12-15 DIAGNOSIS — I2510 Atherosclerotic heart disease of native coronary artery without angina pectoris: Secondary | ICD-10-CM | POA: Diagnosis not present

## 2022-12-15 DIAGNOSIS — I10 Essential (primary) hypertension: Secondary | ICD-10-CM

## 2022-12-15 LAB — ECHOCARDIOGRAM COMPLETE
AR max vel: 1.05 cm2
AV Area VTI: 1.11 cm2
AV Area mean vel: 1.03 cm2
AV Mean grad: 19 mmHg
AV Peak grad: 34 mmHg
Ao pk vel: 2.92 m/s
Area-P 1/2: 2.8 cm2
S' Lateral: 3.6 cm

## 2022-12-25 ENCOUNTER — Telehealth: Payer: Self-pay | Admitting: Internal Medicine

## 2022-12-25 DIAGNOSIS — I2510 Atherosclerotic heart disease of native coronary artery without angina pectoris: Secondary | ICD-10-CM

## 2022-12-25 DIAGNOSIS — E782 Mixed hyperlipidemia: Secondary | ICD-10-CM

## 2022-12-25 DIAGNOSIS — I10 Essential (primary) hypertension: Secondary | ICD-10-CM

## 2022-12-25 NOTE — Telephone Encounter (Signed)
-----   Message from Christell Constant sent at 12/17/2022  9:45 PM EDT ----- Results: BP not accurately recorded Moderate to Severe LFLGAS (SVI diminished with DVI 0.27, valve visually appears more moderate Plan: Calcium score for AV calcium (assign to me or have them do AV calcium score)  Christell Constant, MD

## 2022-12-25 NOTE — Telephone Encounter (Signed)
Wife was returning calls for results. Please advise

## 2022-12-25 NOTE — Telephone Encounter (Signed)
Called spoke with pt spouse ok per DPR.  Spouse wanted to ensure she understood MD summary correctly.  MD wants to look at valve to see amount of buildup.   Advised spouse this is correct. Aortic stenosis appears to have worsened from previous Echo.  MD performing Calcium score test to evaluate calcium buildup on valve. Test will be performed at our [facility] location.  Cost is $99 OOP insurance does not cover.  Order placed a scheduler will call to set up appointment time.

## 2023-01-01 ENCOUNTER — Ambulatory Visit (HOSPITAL_COMMUNITY)
Admission: RE | Admit: 2023-01-01 | Discharge: 2023-01-01 | Disposition: A | Discharge status: Home / Self Care | Payer: Medicare Other | Source: Ambulatory Visit | Attending: Internal Medicine | Admitting: Internal Medicine

## 2023-01-01 ENCOUNTER — Ambulatory Visit (INDEPENDENT_AMBULATORY_CARE_PROVIDER_SITE_OTHER): Payer: Medicare Other

## 2023-01-01 ENCOUNTER — Encounter (HOSPITAL_COMMUNITY): Payer: Self-pay

## 2023-01-01 VITALS — Ht 72.0 in | Wt 178.0 lb

## 2023-01-01 DIAGNOSIS — I10 Essential (primary) hypertension: Secondary | ICD-10-CM | POA: Insufficient documentation

## 2023-01-01 DIAGNOSIS — I2510 Atherosclerotic heart disease of native coronary artery without angina pectoris: Secondary | ICD-10-CM | POA: Insufficient documentation

## 2023-01-01 DIAGNOSIS — I2584 Coronary atherosclerosis due to calcified coronary lesion: Secondary | ICD-10-CM | POA: Insufficient documentation

## 2023-01-01 DIAGNOSIS — E782 Mixed hyperlipidemia: Secondary | ICD-10-CM | POA: Insufficient documentation

## 2023-01-01 DIAGNOSIS — Z0000 Encounter for general adult medical examination without abnormal findings: Secondary | ICD-10-CM | POA: Diagnosis not present

## 2023-01-01 NOTE — Progress Notes (Signed)
Subjective:   Lee Blevins is a 77 y.o. male who presents for Medicare Annual/Subsequent preventive examination.  Visit Complete: Virtual  I connected with  Lee Blevins on 01/01/23 by a audio enabled telemedicine application and verified that I am speaking with the correct person using two identifiers.  Patient Location: Home  Provider Location: Office/Clinic  I discussed the limitations of evaluation and management by telemedicine. The patient expressed understanding and agreed to proceed.  Vital Signs: Because this visit was a virtual/telehealth visit, some criteria may be missing or patient reported. Any vitals not documented were not able to be obtained and vitals that have been documented are patient reported.   Patient provided his/her weight during this virtual phone visit.  Cardiac Risk Factors include: advanced age (>68men, >63 women);dyslipidemia;family history of premature cardiovascular disease;hypertension;male gender     Objective:    Today's Vitals   01/01/23 1603 01/01/23 1604  Weight: 178 lb (80.7 kg)   Height: 6' (1.829 m)   PainSc: 0-No pain 0-No pain   Body mass index is 24.14 kg/m.     01/01/2023    4:04 PM 11/04/2021    9:52 AM 02/02/2021   10:08 AM 02/02/2021    8:55 AM 01/17/2021    5:00 PM 01/11/2021    4:27 PM  Advanced Directives  Does Patient Have a Medical Advance Directive? Yes Yes No No No No  Type of Estate agent of [place];Living will Living will;Healthcare Power of Attorney      Does patient want to make changes to medical advance directive?  No - Patient declined      Copy of Healthcare Power of Attorney in Chart? No - copy requested No - copy requested      Would patient like information on creating a medical advance directive?   No - Patient declined No - Patient declined No - Patient declined No - Patient declined    Current Medications (verified) Outpatient Encounter Medications as of 01/01/2023   Medication Sig   albuterol (VENTOLIN HFA) 108 (90 Base) MCG/ACT inhaler TAKE 2 PUFFS BY MOUTH EVERY 6 HOURS AS NEEDED FOR WHEEZE OR SHORTNESS OF BREATH   ezetimibe (ZETIA) 10 MG tablet Take 1 tablet (10 mg total) by mouth daily.   indapamide (LOZOL) 1.25 MG tablet Take 1 tablet (1.25 mg total) by mouth daily.   irbesartan (AVAPRO) 150 MG tablet Take 1 tablet (150 mg total) by mouth daily.   montelukast (SINGULAIR) 10 MG tablet TAKE 1 TABLET BY MOUTH EVERYDAY AT BEDTIME   Vitamin D, Ergocalciferol, (DRISDOL) 1.25 MG (50000 UNIT) CAPS capsule every 7 (seven) days.   WIXELA INHUB 250-50 MCG/ACT AEPB INHALE 1 PUFF BY MOUTH TWICE DAILY, RINSING MOUTH AFTERWARDS   No facility-administered encounter medications on file as of 01/01/2023.    Allergies (verified) Amlodipine, Crestor [rosuvastatin], Livalo [pitavastatin], and Bystolic [nebivolol hcl]   History: Past Medical History:  Diagnosis Date   Asthma    Chronic sinusitis    History of CVA (cerebrovascular accident) 2011   hemorrhagic   HTN (hypertension)    Hyperlipidemia    Nasal polyps    Pneumonia    Past Surgical History:  Procedure Laterality Date   NASAL POLYP EXCISION     x2   TONSILLECTOMY     Family History  Problem Relation Age of Onset   Heart disease Father    Valvular heart disease Father    Neurologic Disorder Mother 20       Similar to  MS   Cancer Neg Hx    Early death Neg Hx    Hearing loss Neg Hx    Hyperlipidemia Neg Hx    Hypertension Neg Hx    Stroke Neg Hx    Kidney disease Neg Hx    Social History   Socioeconomic History   Marital status: Married    Spouse name: Lee Blevins   Number of children: Not on file   Years of education: Not on file   Highest education level: Bachelor's degree (e.g., BA, AB, BS)  Occupational History   Occupation: RETIRED    Employer: RETIRED    CommentMining engineer  Tobacco Use   Smoking status: Former    Current packs/day: 0.00    Average packs/day: 1 pack/day for  1 year (1.0 ttl pk-yrs)    Types: Cigarettes    Start date: 04/17/1972    Quit date: 04/17/1973    Years since quitting: 49.7   Smokeless tobacco: Never   Tobacco comments:    1975  Vaping Use   Vaping status: Never Used  Substance and Sexual Activity   Alcohol use: Yes    Alcohol/week: 15.0 standard drinks of alcohol    Types: 15 Glasses of wine per week    Comment: 02/21/21 7 a week   Drug use: No   Sexual activity: Not Currently  Other Topics Concern   Not on file  Social History Narrative   02/21/21 lives with wife   Regular Exercise -  YES   Social Determinants of Health   Financial Resource Strain: Low Risk  (01/01/2023)   Overall Financial Resource Strain (CARDIA)    Difficulty of Paying Living Expenses: Not hard at all  Food Insecurity: No Food Insecurity (01/01/2023)   Hunger Vital Sign    Worried About Running Out of Food in the Last Year: Never true    Ran Out of Food in the Last Year: Never true  Transportation Needs: No Transportation Needs (01/01/2023)   PRAPARE - Administrator, Civil Service (Medical): No    Lack of Transportation (Non-Medical): No  Physical Activity: Sufficiently Active (01/01/2023)   Exercise Vital Sign    Days of Exercise per Week: 5 days    Minutes of Exercise per Session: 30 min  Stress: No Stress Concern Present (01/01/2023)   Lee Blevins of Occupational Health - Occupational Stress Questionnaire    Feeling of Stress : Not at all  Social Connections: Socially Integrated (01/01/2023)   Social Connection and Isolation Panel [NHANES]    Frequency of Communication with Friends and Family: More than three times a week    Frequency of Social Gatherings with Friends and Family: More than three times a week    Attends Religious Services: More than 4 times per year    Active Member of [employer] or Organizations: Yes    Attends Engineer, structural: More than 4 times per year    Marital Status: Married    Tobacco  Counseling Counseling given: Not Answered Tobacco comments: 1975   Clinical Intake:  Pre-visit preparation completed: Yes  Pain : No/denies pain Pain Score: 0-No pain     BMI - recorded: 24.14 Nutritional Status: BMI of 19-24  Normal Nutritional Risks: None Diabetes: No  How often do you need to have someone help you when you read instructions, pamphlets, or other written materials from your doctor or pharmacy?: 1 - Never What is the last grade level you completed in school?: HSG  Interpreter  Needed?: No  Information entered by :: Aribelle Mccosh N. Chicquita Mendel, LPN.   Activities of Daily Living    01/01/2023    4:08 PM  In your present state of health, do you have any difficulty performing the following activities:  Hearing? 0  Vision? 0  Difficulty concentrating or making decisions? 0  Walking or climbing stairs? 0  Dressing or bathing? 0  Doing errands, shopping? 0  Preparing Food and eating ? N  Using the Toilet? N  In the past six months, have you accidently leaked urine? N  Do you have problems with loss of bowel control? N  Managing your Medications? N  Managing your Finances? N  Housekeeping or managing your Housekeeping? N    Patient Care Team: Etta Grandchild, MD as PCP - General (Internal Medicine) Martha Clan Shirlean Mylar, OD as Consulting Physician (Optometry)  Indicate any recent Medical Services you may have received from other than [facility] providers in the past year (date may be approximate).     Assessment:   This is a routine wellness examination for Abdur.  Hearing/Vision screen Hearing Screening - Comments:: Patient denied any hearing difficulty.   No hearing aids.  Vision Screening - Comments:: Patient does wear corrective lenses/contacts.  Annual eye exam done by: Mannie Stabile, OD.    Goals Addressed             This Visit's Progress    My healthcare goal for 2024-2025 is to maintain my current health status by continuing to eat healthy, stay  independent, physically and socially active.        Depression Screen    01/01/2023    4:07 PM 11/04/2021    9:55 AM 08/16/2021   11:02 AM 02/08/2021   11:51 AM 06/19/2018    1:25 PM 06/24/2014   11:39 AM 10/23/2012    1:27 PM  PHQ 2/9 Scores  PHQ - 2 Score 0 0 0 0 0 0 0  PHQ- 9 Score 0   0       Fall Risk    01/01/2023    4:05 PM 11/04/2021    9:53 AM 08/16/2021   11:02 AM 02/08/2021   11:51 AM 06/20/2018   10:46 AM  Fall Risk   Falls in the past year? 0 0 0 0 0  Number falls in past yr: 0 0 0 0   Injury with Fall? 0 0     Risk for fall due to : No Fall Risks No Fall Risks     Follow up Falls prevention discussed Falls evaluation completed       MEDICARE RISK AT HOME: Medicare Risk at Home Any stairs in or around the home?: No If so, are there any without handrails?: No Home free of loose throw rugs in walkways, pet beds, electrical cords, etc?: Yes Adequate lighting in your home to reduce risk of falls?: Yes Life alert?: No Use of a cane, walker or w/c?: No Grab bars in the bathroom?: No Shower chair or bench in shower?: Yes Elevated toilet seat or a handicapped toilet?: Yes  TIMED UP AND GO:  Was the test performed?  No    Cognitive Function:    08/03/2021    3:02 PM  MMSE - Mini Mental State Exam  Orientation to time 3  Orientation to Place 3  Registration 3  Attention/ Calculation 0  Recall 2  Language- name 2 objects 2  Language- repeat 0  Language- follow 3 step command 3  Language-  read & follow direction 1  Write a sentence 1  Copy design 0  Total score 18        01/01/2023    4:07 PM 11/04/2021    9:59 AM  6CIT Screen  What Year? 0 points 0 points  What month? 0 points 0 points  What time? 0 points 0 points  Count back from 20 0 points 0 points  Months in reverse 0 points 0 points  Repeat phrase 0 points 0 points  Total Score 0 points 0 points    Immunizations Immunization History  Administered Date(s) Administered   Fluad Quad(high Dose  65+) 12/09/2018, 03/06/2020, 03/16/2021   Influenza Split 01/17/2011, 01/31/2012   Influenza Whole 02/18/2008, 04/07/2010   Influenza, High Dose Seasonal PF 12/14/2015, 03/02/2017, 02/09/2018   Influenza,inj,Quad PF,6+ Mos 01/13/2014, 12/31/2014   Influenza-Unspecified 01/29/2013   PFIZER(Purple Top)SARS-COV-2 Vaccination 05/03/2019, 05/23/2019   Pneumococcal Conjugate-13 06/24/2014   Pneumococcal Polysaccharide-23 02/18/2008, 04/07/2010, 03/27/2019   Tdap 08/05/2010, 12/09/2018    TDAP status: Up to date  Flu Vaccine status: Due, Education has been provided regarding the importance of this vaccine. Advised may receive this vaccine at local pharmacy or Health Dept. Aware to provide a copy of the vaccination record if obtained from local pharmacy or Health Dept. Verbalized acceptance and understanding.  Pneumococcal vaccine status: Up to date  Covid-19 vaccine status: Completed vaccines  Qualifies for Shingles Vaccine? Yes   Zostavax completed No   Shingrix Completed?: No.    Education has been provided regarding the importance of this vaccine. Patient has been advised to call insurance company to determine out of pocket expense if they have not yet received this vaccine. Advised may also receive vaccine at local pharmacy or Health Dept. Verbalized acceptance and understanding.  Screening Tests Health Maintenance  Topic Date Due   Zoster Vaccines- Shingrix (1 of 2) Never done   INFLUENZA VACCINE  11/16/2022   COVID-19 Vaccine (3 - 2023-24 season) 12/17/2022   Medicare Annual Wellness (AWV)  01/01/2024   DTaP/Tdap/Td (3 - Td or Tdap) 12/08/2028   Pneumonia Vaccine 34+ Years old  Completed   Hepatitis C Screening  Completed   HPV VACCINES  Aged Out   Colonoscopy  Discontinued    Health Maintenance  Health Maintenance Due  Topic Date Due   Zoster Vaccines- Shingrix (1 of 2) Never done   INFLUENZA VACCINE  11/16/2022   COVID-19 Vaccine (3 - 2023-24 season) 12/17/2022     Colorectal cancer screening: No longer required.   Lung Cancer Screening: (Low Dose CT Chest recommended if Age 11-80 years, 20 pack-year currently smoking OR have quit w/in 15years.) does not qualify.   Lung Cancer Screening Referral: no  Additional Screening:  Hepatitis C Screening: does qualify; Completed 12/14/2015  Vision Screening: Recommended annual ophthalmology exams for early detection of glaucoma and other disorders of the eye. Is the patient up to date with their annual eye exam?  Yes  Who is the provider or what is the name of the office in which the patient attends annual eye exams? Stacey Hutto, OD. If pt is not established with a provider, would they like to be referred to a provider to establish care? No .   Dental Screening: Recommended annual dental exams for proper oral hygiene  Diabetic Foot Exam: N/A  Community Resource Referral / Chronic Care Management: CRR required this visit?  No   CCM required this visit?  No     Plan:     I  have personally reviewed and noted the following in the patient's chart:   Medical and social history Use of alcohol, tobacco or illicit drugs  Current medications and supplements including opioid prescriptions. Patient is not currently taking opioid prescriptions. Functional ability and status Nutritional status Physical activity Advanced directives List of other physicians Hospitalizations, surgeries, and ER visits in previous 12 months Vitals Screenings to include cognitive, depression, and falls Referrals and appointments  In addition, I have reviewed and discussed with patient certain preventive protocols, quality metrics, and best practice recommendations. A written personalized care plan for preventive services as well as general preventive health recommendations were provided to patient.     Mickeal Needy, LPN   1/61/0960   After Visit Summary: (Mail) Due to this being a telephonic visit, the after  visit summary with patients personalized plan was offered to patient via mail   Nurse Notes: Normal cognitive status assessed by direct observation via telephone conversation by this Nurse Health Advisor. No abnormalities found.

## 2023-01-01 NOTE — Patient Instructions (Addendum)
Lee Blevins , Thank you for taking time to come for your Medicare Wellness Visit. I appreciate your ongoing commitment to your health goals. Please review the following plan we discussed and let me know if I can assist you in the future.   Referrals/Orders/Follow-Ups/Clinician Recommendations: NO  This is a list of the screening recommended for you and due dates:  Health Maintenance  Topic Date Due   Zoster (Shingles) Vaccine (1 of 2) Never done   Flu Shot  11/16/2022   COVID-19 Vaccine (3 - 2023-24 season) 12/17/2022   Medicare Annual Wellness Visit  01/01/2024   DTaP/Tdap/Td vaccine (3 - Td or Tdap) 12/08/2028   Pneumonia Vaccine  Completed   Hepatitis C Screening  Completed   HPV Vaccine  Aged Out   Colon Cancer Screening  Discontinued    Advanced directives: (Copy Requested) Please bring a copy of your health care power of attorney and living will to the office to be added to your chart at your convenience.  Next Medicare Annual Wellness Visit scheduled for next year: Yes

## 2023-01-12 ENCOUNTER — Other Ambulatory Visit: Payer: Self-pay

## 2023-01-12 DIAGNOSIS — I10 Essential (primary) hypertension: Secondary | ICD-10-CM

## 2023-01-12 DIAGNOSIS — I2510 Atherosclerotic heart disease of native coronary artery without angina pectoris: Secondary | ICD-10-CM

## 2023-01-12 DIAGNOSIS — E782 Mixed hyperlipidemia: Secondary | ICD-10-CM

## 2023-01-12 NOTE — Progress Notes (Signed)
Placed order for Echo due in 1 year per MD instructions 01/01/23 Calcium Score Test.  Results: Very high calcium score (coronaries) Calcium score suggestive of moderate AS Plan: Repeat echo in August, we are pending follow up labs for cholesterol (ordered)   Christell Constant, MD

## 2023-01-28 ENCOUNTER — Other Ambulatory Visit: Payer: Self-pay | Admitting: Internal Medicine

## 2023-01-28 DIAGNOSIS — I10 Essential (primary) hypertension: Secondary | ICD-10-CM

## 2023-01-30 ENCOUNTER — Ambulatory Visit: Payer: Medicare Other | Admitting: Internal Medicine

## 2023-03-05 ENCOUNTER — Ambulatory Visit: Payer: Medicare Other | Attending: Internal Medicine

## 2023-03-05 DIAGNOSIS — I2510 Atherosclerotic heart disease of native coronary artery without angina pectoris: Secondary | ICD-10-CM

## 2023-03-05 DIAGNOSIS — E782 Mixed hyperlipidemia: Secondary | ICD-10-CM

## 2023-03-05 DIAGNOSIS — I10 Essential (primary) hypertension: Secondary | ICD-10-CM

## 2023-03-05 LAB — LIPID PANEL
Chol/HDL Ratio: 1.9 ratio (ref 0.0–5.0)
Cholesterol, Total: 200 mg/dL — ABNORMAL HIGH (ref 100–199)
HDL: 108 mg/dL (ref >39)
LDL Chol Calc (NIH): 83 mg/dL (ref 0–99)
Triglycerides: 45 mg/dL (ref 0–149)
VLDL Cholesterol Cal: 9 mg/dL (ref 5–40)

## 2023-03-12 ENCOUNTER — Telehealth: Payer: Self-pay

## 2023-03-12 DIAGNOSIS — E782 Mixed hyperlipidemia: Secondary | ICD-10-CM

## 2023-03-12 DIAGNOSIS — I2510 Atherosclerotic heart disease of native coronary artery without angina pectoris: Secondary | ICD-10-CM

## 2023-03-12 NOTE — Telephone Encounter (Signed)
-----   Message from Christell Constant sent at 03/07/2023 11:17 AM EST ----- Results: LDL above goal based on calcium score despite dietary changes  Plan: Statin myopathy- offer lipid clinic  Christell Constant, MD

## 2023-03-12 NOTE — Telephone Encounter (Signed)
The patient has been notified of the result and verbalized understanding.  All questions (if any) were answered. Macie Burows, RN 03/12/2023 12:49 PM   Scheduled Pharm D OV for 05/06/22 at 1:30 pm.  All questions answered.

## 2023-03-22 ENCOUNTER — Other Ambulatory Visit: Payer: Self-pay | Admitting: Internal Medicine

## 2023-04-26 ENCOUNTER — Other Ambulatory Visit: Payer: Self-pay | Admitting: Internal Medicine

## 2023-04-26 DIAGNOSIS — I10 Essential (primary) hypertension: Secondary | ICD-10-CM

## 2023-05-05 ENCOUNTER — Other Ambulatory Visit: Payer: Self-pay | Admitting: Internal Medicine

## 2023-05-05 DIAGNOSIS — I10 Essential (primary) hypertension: Secondary | ICD-10-CM

## 2023-05-07 ENCOUNTER — Ambulatory Visit: Payer: Medicare Other

## 2023-06-04 ENCOUNTER — Ambulatory Visit: Payer: Medicare Other | Attending: Cardiology | Admitting: Pharmacist

## 2023-06-04 DIAGNOSIS — I10 Essential (primary) hypertension: Secondary | ICD-10-CM | POA: Diagnosis present

## 2023-06-04 DIAGNOSIS — E785 Hyperlipidemia, unspecified: Secondary | ICD-10-CM | POA: Insufficient documentation

## 2023-06-04 MED ORDER — ROSUVASTATIN CALCIUM 5 MG PO TABS: 5.0000 mg | ORAL_TABLET | ORAL | 3 refills | Status: AC

## 2023-06-04 MED ORDER — IRBESARTAN 150 MG PO TABS: 150.0000 mg | ORAL_TABLET | Freq: Every day | ORAL | 3 refills | Status: AC

## 2023-06-04 NOTE — Progress Notes (Signed)
 Patient ID: Lee Blevins                 DOB: [birth year]                    MRN: [id number]      HPI: Lee Blevins is a 78 y.o. male patient referred to lipid clinic by Dr. Izora Ribas. PMH is significant for HLD, HTN, and prior hemorrhagic stroke.  In September 2024 patient had a coronary calcium score done which showed a score of 4260.  This was 94th percentile.  Patient attempted dietary changes.  Repeat LDL-C was 83.  Due to statin intolerance he was referred to lipid clinic.   Patient presents to clinic accompanied by his wife.  We had an extensive conversation about his coronary calcium score, cardiovascular risk factors including diet and exercise and blood pressure.  Patient currently taking ezetimibe 10 mg daily tolerating fine.  He has 2 to 3 glasses of wine nightly. Triglycerides are very well-controlled.    Current Medications: Ezetimibe 10 mg daily Intolerances: Rosuvastatin 20mg  , pitavastatin 2mg  (muscle aches) Risk Factors: Elevated coronary calcium score 4260, hypertension, age LDL-C goal: Less than 70 ApoB goal: Less than 80  Diet:  Breakfast: eggs, cereal, bananas Vegetables, cauliflower, red peppers Not much fish More chicken than beef Drink: water,   Exercise: yard work, walks when the weather is nicer  Family History:  Family History  Problem Relation Age of Onset   Heart disease Father    Valvular heart disease Father    Neurologic Disorder Mother 92       Similar to MS   Cancer Neg Hx    Early death Neg Hx    Hearing loss Neg Hx    Hyperlipidemia Neg Hx    Hypertension Neg Hx    Stroke Neg Hx    Kidney disease Neg Hx      Social History: wine 2-3 glasses per night  Labs: Lipid Panel     Component Value Date/Time   CHOL 200 (H) 03/05/2023 1034   TRIG 45 03/05/2023 1034   HDL 108 03/05/2023 1034   CHOLHDL 1.9 03/05/2023 1034   CHOLHDL 2 10/31/2022 1544   VLDL 10.6 10/31/2022 1544   LDLCALC 83 03/05/2023 1034   LDLDIRECT 160.0 06/13/2013  1414   LABVLDL 9 03/05/2023 1034    Past Medical History:  Diagnosis Date   Asthma    Chronic sinusitis    History of CVA (cerebrovascular accident) 2011   hemorrhagic   HTN (hypertension)    Hyperlipidemia    Nasal polyps    Pneumonia     Current Outpatient Medications on File Prior to Visit  Medication Sig Dispense Refill   albuterol (VENTOLIN HFA) 108 (90 Base) MCG/ACT inhaler TAKE 2 PUFFS BY MOUTH EVERY 6 HOURS AS NEEDED FOR WHEEZE OR SHORTNESS OF BREATH 8.5 each 8   ezetimibe (ZETIA) 10 MG tablet Take 1 tablet (10 mg total) by mouth daily. 90 tablet 3   indapamide (LOZOL) 1.25 MG tablet TAKE 1 TABLET BY MOUTH DAILY. 90 tablet 0   montelukast (SINGULAIR) 10 MG tablet TAKE 1 TABLET BY MOUTH EVERYDAY AT BEDTIME 90 tablet 4   Vitamin D, Ergocalciferol, (DRISDOL) 1.25 MG (50000 UNIT) CAPS capsule every 7 (seven) days.     WIXELA INHUB 250-50 MCG/ACT AEPB INHALE 1 PUFF BY MOUTH TWICE DAILY, RINSING MOUTH AFTERWARDS 180 each 3   No current facility-administered medications on file prior to visit.    Allergies  Allergen Reactions   Amlodipine     edema   Crestor [Rosuvastatin]     Muscle aches   Livalo [Pitavastatin]     Muscle aches   Bystolic [Nebivolol Hcl]     dizziness    Assessment/Plan:  1. Hyperlipidemia -  Hyperlipidemia with target LDL less than 70 Assessment: LDL-C is above goal of less than 70 Patient currently on ezetimibe 10 mg daily Previously took rosuvastatin 20 mg daily to Visanne 2 mg daily which caused muscle aches Discussed his coronary calcium score, the progressive nature of ASCVD, benefits of statin therapy including plaque stabilization and LDL-C reduction Reviewed diet in detail including how to pick out a good olive oil Encouraged more exercise including some resistance training.  Handout given  Plan: Patient willing to take low-dose statin after thorough conversation Will start rosuvastatin 5 mg 3 times a week Continue ezetimibe 10 mg  daily Recheck labs in 3 months.  Lab orders given    Thank you,  Olene Floss, Pharm.D, BCACP, CPP [facility] A Division of [facility] [address] [place], [place] [address]  Phone: [phone]; Fax: [phone]

## 2023-06-04 NOTE — Patient Instructions (Addendum)
 Start taking rosuvastatin 5mg  three times a day Continue ezetimibe 10mg  daily Please go for fasting labs at [facility] in 3 months

## 2023-06-04 NOTE — Assessment & Plan Note (Signed)
 Assessment: LDL-C is above goal of less than 70 Patient currently on ezetimibe 10 mg daily Previously took rosuvastatin 20 mg daily to Visanne 2 mg daily which caused muscle aches Discussed his coronary calcium score, the progressive nature of ASCVD, benefits of statin therapy including plaque stabilization and LDL-C reduction Reviewed diet in detail including how to pick out a good olive oil Encouraged more exercise including some resistance training.  Handout given  Plan: Patient willing to take low-dose statin after thorough conversation Will start rosuvastatin 5 mg 3 times a week Continue ezetimibe 10 mg daily Recheck labs in 3 months.  Lab orders given

## 2023-09-24 ENCOUNTER — Encounter: Payer: Self-pay | Admitting: Pharmacist

## 2023-11-25 ENCOUNTER — Other Ambulatory Visit: Payer: Self-pay | Admitting: Internal Medicine

## 2023-12-03 ENCOUNTER — Ambulatory Visit (HOSPITAL_COMMUNITY)
Admission: RE | Admit: 2023-12-03 | Discharge: 2023-12-03 | Disposition: A | Discharge status: Home / Self Care | Source: Ambulatory Visit | Attending: Internal Medicine | Admitting: Internal Medicine

## 2023-12-03 DIAGNOSIS — I10 Essential (primary) hypertension: Secondary | ICD-10-CM | POA: Insufficient documentation

## 2023-12-03 DIAGNOSIS — I2510 Atherosclerotic heart disease of native coronary artery without angina pectoris: Secondary | ICD-10-CM | POA: Diagnosis not present

## 2023-12-03 DIAGNOSIS — E782 Mixed hyperlipidemia: Secondary | ICD-10-CM | POA: Diagnosis not present

## 2023-12-03 LAB — ECHOCARDIOGRAM COMPLETE
AR max vel: 0.94 cm2
AV Area VTI: 0.95 cm2
AV Area mean vel: 0.94 cm2
AV Mean grad: 23 mmHg
AV Peak grad: 42.8 mmHg
Ao pk vel: 3.27 m/s
Area-P 1/2: 3.74 cm2
MV M vel: 5.59 m/s
MV Peak grad: 124.8 mmHg
Radius: 0.4 cm
S' Lateral: 3 cm

## 2023-12-11 ENCOUNTER — Ambulatory Visit: Payer: Self-pay

## 2024-01-02 ENCOUNTER — Ambulatory Visit (INDEPENDENT_AMBULATORY_CARE_PROVIDER_SITE_OTHER): Payer: Medicare Other

## 2024-01-02 VITALS — Ht 73.0 in | Wt 178.0 lb

## 2024-01-02 DIAGNOSIS — Z0000 Encounter for general adult medical examination without abnormal findings: Secondary | ICD-10-CM | POA: Diagnosis not present

## 2024-01-02 NOTE — Patient Instructions (Addendum)
 [name],  Thank you for taking the time for your Medicare Wellness Visit. I appreciate your continued commitment to your health goals. Please review the care plan we discussed, and feel free to reach out if I can assist you further.  Medicare recommends these wellness visits once per year to help you and your care team stay ahead of potential health issues. These visits are designed to focus on prevention, allowing your provider to concentrate on managing your acute and chronic conditions during your regular appointments.  Please note that Annual Wellness Visits do not include a physical exam. Some assessments may be limited, especially if the visit was conducted virtually. If needed, we may recommend a separate in-person follow-up with your provider.  Ongoing Care Seeing your primary care provider every 3 to 6 months helps us  monitor your health and provide consistent, personalized care.  Referrals If a referral was made during today's visit and you haven't received any updates within two weeks, please contact the referred provider directly to check on the status.  Recommended Screenings:  Health Maintenance  Topic Date Due   Zoster (Shingles) Vaccine (1 of 2) Never done   Flu Shot  11/16/2023   COVID-19 Vaccine (3 - 2025-26 season) 12/17/2023   Medicare Annual Wellness Visit  01/01/2025   DTaP/Tdap/Td vaccine (3 - Td or Tdap) 12/08/2028   Pneumococcal Vaccine for age over 35  Completed   HPV Vaccine  Aged Out   Meningitis B Vaccine  Aged Out   Colon Cancer Screening  Discontinued   Hepatitis C Screening  Discontinued       01/02/2024    3:40 PM  Advanced Directives  Does Patient Have a Medical Advance Directive? Yes  Type of Estate agent of [place];Living will  Copy of Healthcare Power of Attorney in Chart? No - copy requested   Advance Care Planning is important because it: Ensures you receive medical care that aligns with your values, goals, and  preferences. Provides guidance to your family and loved ones, reducing the emotional burden of decision-making during critical moments.  Vision: Annual vision screenings are recommended for early detection of glaucoma, cataracts, and diabetic retinopathy. These exams can also reveal signs of chronic conditions such as diabetes and high blood pressure.  Dental: Annual dental screenings help detect early signs of oral cancer, gum disease, and other conditions linked to overall health, including heart disease and diabetes.

## 2024-01-02 NOTE — Progress Notes (Signed)
 Subjective:   Lee Blevins is a 78 y.o. who presents for a Medicare Wellness preventive visit.  As a reminder, Annual Wellness Visits don't include a physical exam, and some assessments may be limited, especially if this visit is performed virtually. We may recommend an in-person follow-up visit with your provider if needed.  Visit Complete: Virtual I connected with  Lee Blevins on 01/02/24 by a audio enabled telemedicine application and verified that I am speaking with the correct person using two identifiers.  Patient Location: Home  Provider Location: Office/Clinic  I discussed the limitations of evaluation and management by telemedicine. The patient expressed understanding and agreed to proceed.  Vital Signs: Because this visit was a virtual/telehealth visit, some criteria may be missing or patient reported. Any vitals not documented were not able to be obtained and vitals that have been documented are patient reported.  VideoDeclined- This patient declined Librarian, academic. Therefore the visit was completed with audio only.  Persons Participating in Visit: Patient assisted by Spouse, Lee Blevins.  AWV Questionnaire: No: Patient Medicare AWV questionnaire was not completed prior to this visit.  Cardiac Risk Factors include: advanced age (>55men, >48 women);dyslipidemia;hypertension;male gender     Objective:    Today's Vitals   01/02/24 1540  Weight: 178 lb (80.7 kg)  Height: 6' 1 (1.854 m)   Body mass index is 23.48 kg/m.     01/02/2024    3:40 PM 01/01/2023    4:04 PM 11/04/2021    9:52 AM 02/02/2021   10:08 AM 02/02/2021    8:55 AM 01/17/2021    5:00 PM 01/11/2021    4:27 PM  Advanced Directives  Does Patient Have a Medical Advance Directive? Yes Yes Yes No No No No  Type of Estate agent of [place];Living will Healthcare Power of [place];Living will Living will;Healthcare Power of Attorney      Does  patient want to make changes to medical advance directive?   No - Patient declined      Copy of Healthcare Power of Attorney in Chart? No - copy requested No - copy requested No - copy requested      Would patient like information on creating a medical advance directive?    No - Patient declined No - Patient declined No - Patient declined No - Patient declined    Current Medications (verified) Outpatient Encounter Medications as of 01/02/2024  Medication Sig   albuterol  (VENTOLIN  HFA) 108 (90 Base) MCG/ACT inhaler TAKE 2 PUFFS BY MOUTH EVERY 6 HOURS AS NEEDED FOR WHEEZE OR SHORTNESS OF BREATH   ezetimibe  (ZETIA ) 10 MG tablet TAKE 1 TABLET BY MOUTH EVERY DAY   indapamide  (LOZOL ) 1.25 MG tablet TAKE 1 TABLET BY MOUTH DAILY.   irbesartan  (AVAPRO ) 150 MG tablet Take 1 tablet (150 mg total) by mouth daily.   montelukast  (SINGULAIR ) 10 MG tablet TAKE 1 TABLET BY MOUTH EVERYDAY AT BEDTIME   rosuvastatin  (CRESTOR ) 5 MG tablet Take 1 tablet (5 mg total) by mouth 3 (three) times a week.   Vitamin D , Ergocalciferol , (DRISDOL) 1.25 MG (50000 UNIT) CAPS capsule every 7 (seven) days.   WIXELA INHUB 250-50 MCG/ACT AEPB INHALE 1 PUFF BY MOUTH TWICE DAILY, RINSING MOUTH AFTERWARDS   No facility-administered encounter medications on file as of 01/02/2024.    Allergies (verified) Amlodipine , Crestor  [rosuvastatin ], Livalo  [pitavastatin ], and Bystolic  [nebivolol  hcl]   History: Past Medical History:  Diagnosis Date   Asthma    Chronic sinusitis  History of CVA (cerebrovascular accident) 2011   hemorrhagic   HTN (hypertension)    Hyperlipidemia    Nasal polyps    Pneumonia    Past Surgical History:  Procedure Laterality Date   NASAL POLYP EXCISION     x2   TONSILLECTOMY     Family History  Problem Relation Age of Onset   Heart disease Father    Valvular heart disease Father    Neurologic Disorder Mother 84       Similar to MS   Cancer Neg Hx    Early death Neg Hx    Hearing loss Neg Hx     Hyperlipidemia Neg Hx    Hypertension Neg Hx    Stroke Neg Hx    Kidney disease Neg Hx    Social History   Socioeconomic History   Marital status: Married    Spouse name: Investment banker, operational   Number of children: Not on file   Years of education: Not on file   Highest education level: Bachelor's degree (e.g., BA, AB, BS)  Occupational History   Occupation: RETIRED    Employer: RETIRED    CommentMining engineer  Tobacco Use   Smoking status: Former    Current packs/day: 0.00    Average packs/day: 1 pack/day for 1 year (1.0 ttl pk-yrs)    Types: Cigarettes    Start date: 04/17/1972    Quit date: 04/17/1973    Years since quitting: 50.7   Smokeless tobacco: Never   Tobacco comments:    1975  Vaping Use   Vaping status: Never Used  Substance and Sexual Activity   Alcohol use: Yes    Alcohol/week: 15.0 standard drinks of alcohol    Types: 15 Glasses of wine per week    Comment: 02/21/21 7 a week   Drug use: No   Sexual activity: Not Currently  Other Topics Concern   Not on file  Social History Narrative   02/21/21 lives with wife   Regular Exercise -  YES   Social Drivers of Health   Financial Resource Strain: Low Risk  (01/02/2024)   Overall Financial Resource Strain (CARDIA)    Difficulty of Paying Living Expenses: Not hard at all  Food Insecurity: No Food Insecurity (01/02/2024)   Hunger Vital Sign    Worried About Running Out of Food in the Last Year: Never true    Ran Out of Food in the Last Year: Never true  Transportation Needs: No Transportation Needs (01/02/2024)   PRAPARE - Administrator, Civil Service (Medical): No    Lack of Transportation (Non-Medical): No  Physical Activity: Insufficiently Active (01/02/2024)   Exercise Vital Sign    Days of Exercise per Week: 7 days    Minutes of Exercise per Session: 20 min  Stress: No Stress Concern Present (01/02/2024)   [employer] of Occupational Health - Occupational Stress Questionnaire    Feeling of Stress:  Not at all  Social Connections: Moderately Integrated (01/02/2024)   Social Connection and Isolation Panel    Frequency of Communication with Friends and Family: Twice a week    Frequency of Social Gatherings with Friends and Family: Once a week    Attends Religious Services: Never    Database administrator or Organizations: Yes    Attends Banker Meetings: Never    Marital Status: Married    Tobacco Counseling Counseling given: Not Answered Tobacco comments: 1975    Clinical Intake:  Pre-visit preparation completed:  Yes  Pain : No/denies pain     BMI - recorded: 23.48 Nutritional Status: BMI of 19-24  Normal Nutritional Risks: None Diabetes: No  Lab Results  Component Value Date   HGBA1C 5.2 01/12/2021   HGBA1C 5.6 03/17/2019   HGBA1C 5.5 06/08/2015     How often do you need to have someone help you when you read instructions, pamphlets, or other written materials from your doctor or pharmacy?: 1 - Never  Interpreter Needed?: No  Information entered by :: Verdie Saba, CMA   Activities of Daily Living     01/02/2024    3:44 PM  In your present state of health, do you have any difficulty performing the following activities:  Hearing? 0  Vision? 0  Difficulty concentrating or making decisions? 0  Walking or climbing stairs? 0  Dressing or bathing? 0  Doing errands, shopping? 0  Preparing Food and eating ? N  Using the Toilet? N  In the past six months, have you accidently leaked urine? N  Do you have problems with loss of bowel control? N  Managing your Medications? N  Managing your Finances? N  Housekeeping or managing your Housekeeping? N    Patient Care Team: Joshua Debby CROME, MD as PCP - General (Internal Medicine) Raelyn Harlene DEL, OD as Consulting Physician (Optometry)  I have updated your Care Teams any recent Medical Services you may have received from other providers in the past year.     Assessment:   This is a routine  wellness examination for Yohann.  Hearing/Vision screen Hearing Screening - Comments:: Denies hearing difficulties   Vision Screening - Comments:: Wears rx glasses - up to date with routine eye exams with [facility]   Goals Addressed               This Visit's Progress     Patient Stated (pt-stated)        Patient stated he plans to continue walking (staying busy) as much as possible       Depression Screen     01/02/2024    3:46 PM 01/01/2023    4:07 PM 11/04/2021    9:55 AM 08/16/2021   11:02 AM 02/08/2021   11:51 AM 06/19/2018    1:25 PM 06/24/2014   11:39 AM  PHQ 2/9 Scores  PHQ - 2 Score 0 0 0 0 0 0 0  PHQ- 9 Score 0 0   0      Fall Risk     01/02/2024    3:44 PM 01/01/2023    4:05 PM 11/04/2021    9:53 AM 08/16/2021   11:02 AM 02/08/2021   11:51 AM  Fall Risk   Falls in the past year? 0 0 0 0 0  Number falls in past yr: 0 0 0 0 0  Injury with Fall? 0 0 0    Risk for fall due to : No Fall Risks No Fall Risks No Fall Risks    Follow up Falls evaluation completed;Falls prevention discussed Falls prevention discussed Falls evaluation completed        Data saved with a previous flowsheet row definition    MEDICARE RISK AT HOME:  Medicare Risk at Home Any stairs in or around the home?: Yes If so, are there any without handrails?: No Home free of loose throw rugs in walkways, pet beds, electrical cords, etc?: Yes Adequate lighting in your home to reduce risk of falls?: Yes Life alert?: No Use of a  cane, walker or w/c?: No Grab bars in the bathroom?: No Shower chair or bench in shower?: Yes Elevated toilet seat or a handicapped toilet?: No  TIMED UP AND GO:  Was the test performed?  No  Cognitive Function: 6CIT completed    08/03/2021    3:02 PM  MMSE - Mini Mental State Exam  Orientation to time 3  Orientation to Place 3  Registration 3  Attention/ Calculation 0  Recall 2  Language- name 2 objects 2  Language- repeat 0  Language- follow 3 step command 3   Language- read & follow direction 1  Write a sentence 1  Copy design 0  Total score 18        01/02/2024    3:51 PM 01/01/2023    4:07 PM 11/04/2021    9:59 AM  6CIT Screen  What Year? 0 points 0 points 0 points  What month? 0 points 0 points 0 points  What time? 0 points 0 points 0 points  Count back from 20 0 points 0 points 0 points  Months in reverse 0 points 0 points 0 points  Repeat phrase 4 points 0 points 0 points  Total Score 4 points 0 points 0 points    Immunizations Immunization History  Administered Date(s) Administered   Fluad Quad(high Dose 65+) 12/09/2018, 03/06/2020, 03/16/2021   INFLUENZA, HIGH DOSE SEASONAL PF 12/14/2015, 03/02/2017, 02/09/2018   Influenza Split 01/17/2011, 01/31/2012   Influenza Whole 02/18/2008, 04/07/2010   Influenza,inj,Quad PF,6+ Mos 01/13/2014, 12/31/2014   Influenza-Unspecified 01/29/2013   PFIZER(Purple Top)SARS-COV-2 Vaccination 05/03/2019, 05/23/2019   Pneumococcal Conjugate-13 06/24/2014   Pneumococcal Polysaccharide-23 02/18/2008, 04/07/2010, 03/27/2019   Tdap 08/05/2010, 12/09/2018    Screening Tests Health Maintenance  Topic Date Due   Zoster Vaccines- Shingrix  (1 of 2) Never done   Influenza Vaccine  11/16/2023   COVID-19 Vaccine (3 - 2025-26 season) 12/17/2023   Medicare Annual Wellness (AWV)  01/01/2025   DTaP/Tdap/Td (3 - Td or Tdap) 12/08/2028   Pneumococcal Vaccine: 50+ Years  Completed   HPV VACCINES  Aged Out   Meningococcal B Vaccine  Aged Out   Colonoscopy  Discontinued   Hepatitis C Screening  Discontinued    Health Maintenance Items Addressed: 01/02/2024   Additional Screening:  Vision Screening: Recommended annual ophthalmology exams for early detection of glaucoma and other disorders of the eye. Is the patient up to date with their annual eye exam?  Yes  Who is the provider or what is the name of the office in which the patient attends annual eye exams? [facility]  Dental Screening: Recommended  annual dental exams for proper oral hygiene  Community Resource Referral / Chronic Care Management: CRR required this visit?  No   CCM required this visit?  No   Plan:    I have personally reviewed and noted the following in the patient's chart:   Medical and social history Use of alcohol, tobacco or illicit drugs  Current medications and supplements including opioid prescriptions. Patient is not currently taking opioid prescriptions. Functional ability and status Nutritional status Physical activity Advanced directives List of other physicians Hospitalizations, surgeries, and ER visits in previous 12 months Vitals Screenings to include cognitive, depression, and falls Referrals and appointments  In addition, I have reviewed and discussed with patient certain preventive protocols, quality metrics, and best practice recommendations. A written personalized care plan for preventive services as well as general preventive health recommendations were provided to patient.   Verdie CHRISTELLA Saba, CMA  01/02/2024   After Visit Summary: (MyChart) Due to this being a telephonic visit, the after visit summary with patients personalized plan was offered to patient via MyChart   Notes: Scheduled a Physical appt w/PCP for 01/2024

## 2024-01-03 ENCOUNTER — Encounter: Payer: Self-pay | Admitting: Pharmacist

## 2024-02-05 ENCOUNTER — Ambulatory Visit: Admitting: Internal Medicine

## 2025-01-05 ENCOUNTER — Ambulatory Visit
